# Patient Record
Sex: Female | Born: 1940 | Hispanic: No | Marital: Married | Smoking: Never smoker
Health system: Southern US, Community
[De-identification: ages and names within clinical notes are randomized; demographics above are authoritative.]

## PROBLEM LIST (undated history)

## (undated) DIAGNOSIS — F419 Anxiety disorder, unspecified: Secondary | ICD-10-CM

## (undated) DIAGNOSIS — G4730 Sleep apnea, unspecified: Secondary | ICD-10-CM

## (undated) DIAGNOSIS — Z9889 Other specified postprocedural states: Secondary | ICD-10-CM

## (undated) DIAGNOSIS — M8580 Other specified disorders of bone density and structure, unspecified site: Secondary | ICD-10-CM

## (undated) DIAGNOSIS — J45909 Unspecified asthma, uncomplicated: Secondary | ICD-10-CM

## (undated) DIAGNOSIS — K52831 Collagenous colitis: Secondary | ICD-10-CM

## (undated) DIAGNOSIS — R011 Cardiac murmur, unspecified: Secondary | ICD-10-CM

## (undated) DIAGNOSIS — M1990 Unspecified osteoarthritis, unspecified site: Secondary | ICD-10-CM

## (undated) DIAGNOSIS — R112 Nausea with vomiting, unspecified: Secondary | ICD-10-CM

## (undated) DIAGNOSIS — C50919 Malignant neoplasm of unspecified site of unspecified female breast: Secondary | ICD-10-CM

## (undated) DIAGNOSIS — I509 Heart failure, unspecified: Secondary | ICD-10-CM

## (undated) DIAGNOSIS — I10 Essential (primary) hypertension: Secondary | ICD-10-CM

## (undated) DIAGNOSIS — R0600 Dyspnea, unspecified: Secondary | ICD-10-CM

## (undated) DIAGNOSIS — E039 Hypothyroidism, unspecified: Secondary | ICD-10-CM

## (undated) DIAGNOSIS — IMO0002 Reserved for concepts with insufficient information to code with codable children: Secondary | ICD-10-CM

## (undated) DIAGNOSIS — D649 Anemia, unspecified: Secondary | ICD-10-CM

## (undated) HISTORY — DX: Collagenous colitis: K52.831

## (undated) HISTORY — DX: Hypothyroidism, unspecified: E03.9

## (undated) HISTORY — DX: Essential (primary) hypertension: I10

## (undated) HISTORY — PX: TONSILLECTOMY AND ADENOIDECTOMY: SUR1326

## (undated) HISTORY — DX: Unspecified asthma, uncomplicated: J45.909

## (undated) HISTORY — DX: Sleep apnea, unspecified: G47.30

## (undated) HISTORY — PX: CATARACT EXTRACTION W/ INTRAOCULAR LENS  IMPLANT, BILATERAL: SHX1307

## (undated) HISTORY — DX: Other specified disorders of bone density and structure, unspecified site: M85.80

## (undated) HISTORY — PX: OTHER SURGICAL HISTORY: SHX169

## (undated) HISTORY — PX: BREAST REDUCTION SURGERY: SHX8

## (undated) HISTORY — PX: REDUCTION MAMMAPLASTY: SUR839

## (undated) HISTORY — PX: COLONOSCOPY W/ BIOPSIES AND POLYPECTOMY: SHX1376

## (undated) HISTORY — DX: Anxiety disorder, unspecified: F41.9

## (undated) HISTORY — PX: SEPTOPLASTY: SUR1290

## (undated) HISTORY — DX: Unspecified osteoarthritis, unspecified site: M19.90

## (undated) HISTORY — PX: LUMBAR DISC SURGERY: SHX700

## (undated) HISTORY — DX: Malignant neoplasm of unspecified site of unspecified female breast: C50.919

## (undated) HISTORY — PX: RECONSTRUCTION BREAST W/ LATISSIMUS DORSI FLAP: SUR1078

## (undated) HISTORY — PX: MODIFIED RADICAL MASTECTOMY W/ AXILLARY LYMPH NODE DISSECTION: SHX2042

## (undated) HISTORY — PX: TOE SURGERY: SHX1073

---

## 1986-07-05 DIAGNOSIS — C50919 Malignant neoplasm of unspecified site of unspecified female breast: Secondary | ICD-10-CM

## 1986-07-05 HISTORY — DX: Malignant neoplasm of unspecified site of unspecified female breast: C50.919

## 2011-11-24 ENCOUNTER — Telehealth: Payer: Self-pay | Admitting: *Deleted

## 2011-11-24 ENCOUNTER — Other Ambulatory Visit: Payer: Self-pay | Admitting: Physician Assistant

## 2011-11-24 DIAGNOSIS — Z780 Asymptomatic menopausal state: Secondary | ICD-10-CM

## 2011-11-24 DIAGNOSIS — Z853 Personal history of malignant neoplasm of breast: Secondary | ICD-10-CM

## 2011-11-24 NOTE — Telephone Encounter (Signed)
Patient returned my call and I confirmed her for 6/13 w/ Dr. Darnelle Catalan.

## 2011-11-24 NOTE — Telephone Encounter (Signed)
Left message for patient to return my call so I can schedule her to see Dr. Darnelle Catalan.

## 2011-12-16 ENCOUNTER — Other Ambulatory Visit: Payer: Self-pay | Admitting: *Deleted

## 2011-12-16 ENCOUNTER — Encounter: Payer: Self-pay | Admitting: Oncology

## 2011-12-16 ENCOUNTER — Ambulatory Visit: Payer: Medicare Other

## 2011-12-16 ENCOUNTER — Other Ambulatory Visit (HOSPITAL_BASED_OUTPATIENT_CLINIC_OR_DEPARTMENT_OTHER): Payer: Medicare Other | Admitting: Lab

## 2011-12-16 ENCOUNTER — Ambulatory Visit (HOSPITAL_BASED_OUTPATIENT_CLINIC_OR_DEPARTMENT_OTHER): Payer: Medicare Other | Admitting: Oncology

## 2011-12-16 VITALS — BP 151/83 | HR 73 | Temp 98.7°F | Ht 63.0 in | Wt 251.9 lb

## 2011-12-16 DIAGNOSIS — J4530 Mild persistent asthma, uncomplicated: Secondary | ICD-10-CM | POA: Insufficient documentation

## 2011-12-16 DIAGNOSIS — K52831 Collagenous colitis: Secondary | ICD-10-CM

## 2011-12-16 DIAGNOSIS — G4730 Sleep apnea, unspecified: Secondary | ICD-10-CM

## 2011-12-16 DIAGNOSIS — E039 Hypothyroidism, unspecified: Secondary | ICD-10-CM | POA: Insufficient documentation

## 2011-12-16 DIAGNOSIS — Z853 Personal history of malignant neoplasm of breast: Secondary | ICD-10-CM

## 2011-12-16 DIAGNOSIS — C50919 Malignant neoplasm of unspecified site of unspecified female breast: Secondary | ICD-10-CM

## 2011-12-16 DIAGNOSIS — I10 Essential (primary) hypertension: Secondary | ICD-10-CM

## 2011-12-16 DIAGNOSIS — G4733 Obstructive sleep apnea (adult) (pediatric): Secondary | ICD-10-CM | POA: Insufficient documentation

## 2011-12-16 DIAGNOSIS — M899 Disorder of bone, unspecified: Secondary | ICD-10-CM

## 2011-12-16 DIAGNOSIS — M8580 Other specified disorders of bone density and structure, unspecified site: Secondary | ICD-10-CM | POA: Insufficient documentation

## 2011-12-16 DIAGNOSIS — M1990 Unspecified osteoarthritis, unspecified site: Secondary | ICD-10-CM | POA: Insufficient documentation

## 2011-12-16 DIAGNOSIS — J45909 Unspecified asthma, uncomplicated: Secondary | ICD-10-CM

## 2011-12-16 DIAGNOSIS — C50912 Malignant neoplasm of unspecified site of left female breast: Secondary | ICD-10-CM | POA: Insufficient documentation

## 2011-12-16 LAB — CBC WITH DIFFERENTIAL/PLATELET
Basophils Absolute: 0 10*3/uL (ref 0.0–0.1)
Eosinophils Absolute: 0.3 10*3/uL (ref 0.0–0.5)
HGB: 13.7 g/dL (ref 11.6–15.9)
LYMPH%: 15.9 % (ref 14.0–49.7)
MCH: 31.9 pg (ref 25.1–34.0)
MCV: 96.5 fL (ref 79.5–101.0)
MONO%: 10.9 % (ref 0.0–14.0)
NEUT#: 4.6 10*3/uL (ref 1.5–6.5)
Platelets: 153 10*3/uL (ref 145–400)
RBC: 4.3 10*6/uL (ref 3.70–5.45)

## 2011-12-16 LAB — COMPREHENSIVE METABOLIC PANEL
Alkaline Phosphatase: 108 U/L (ref 39–117)
BUN: 17 mg/dL (ref 6–23)
Glucose, Bld: 96 mg/dL (ref 70–99)
Total Bilirubin: 0.3 mg/dL (ref 0.3–1.2)

## 2011-12-16 NOTE — Progress Notes (Signed)
ID: [name]   DOB: 01/28/1941  MR#: [id number]  CSN#:[id number]  HISTORY OF PRESENT ILLNESS: [name] had mammography early in 1988, showing a 5 mm mass in her left breast. I do not have details of further workup of this but in March of 1988 she underwent left modified radical mastectomy for what proved to be a multifocal microscopic invasive ductal breast cancer. There was not enough tissue for estrogen receptor determination. She was treated adjuvantly with CMF x6, which she tolerated well.  Subsequently she underwent reconstruction involving a latissimus flap, which unfortunately necrosed. She later had an implant placed. She had a right reduction mammoplasty for symmetry. Her subsequent history is as detailed below.  INTERVAL HISTORY: [name] has recently moved to this area from [place]. She is interested in survivorship followup. Accordingly she is establishing herself of in our clinic today.  REVIEW OF SYSTEMS: She has some chronic problems, including some deconditioning, with shortness of breath when climbing stairs; and somewhat irregular bowel movements because of her history of collagenous colitis. She has significant osteoarthritis involving chiefly the hips, and she has some age related problems including mild hearing loss and presbyopia. A detailed review of systems however is otherwise noncontributory and in particular she gives me no symptoms suggestive of disease recurrence.   PAST MEDICAL HISTORY: Past Medical History  Diagnosis Date  . Breast cancer   . Hypertension   . Asthma   . Hypothyroidism   . Sleep apnea   . Collagenous colitis   . Osteopenia   . Osteoarthritis     PAST SURGICAL HISTORY: Past Surgical History  Procedure Date  . Modified radical mastectomy w/ axillary lymph node dissection     Left  . Breast enhancement surgery     Right  . Toe surgery   . Septoplasty   . Cesarean section   . Tonsillectomy and adenoidectomy     FAMILY HISTORY The  patient's father died at the age of 70. Her mother died at the age of 57. The patient has one full brother, one half-brother, and one half-sister. The patient is known to be BRCA1 and 2 negative by large rearrangement or specific mutation tests.  GYNECOLOGIC HISTORY: Menarche age 25. First live birth age 71. The patient is GX P1. She underwent early menopause at the age of 11 and took estrogen replacement for several years, switched to progesterone cream when she was diagnosed with breast cancer.   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 Her husband Eniola Cerullo is a former of [employer] man, now retired. Son Wayne Sever lives in [place] and works in Primary school teacher daughter Elyse Jarvis lives in [place] and is a medical or percent. The patient has no brain children. She is not a Advice worker.    ADVANCED DIRECTIVES: not in place   HEALTH MAINTENANCE: History  Substance Use Topics  . Smoking status: Not on file  . Smokeless tobacco: Not on file  . Alcohol Use: Not on file     Colonoscopy: 2012   PAP: 2012  Bone density: 2012  Lipid panel: [place]  Allergies  Allergen Reactions  . Sulfa Antibiotics     Current Outpatient Prescriptions  Medication Sig Dispense Refill  . albuterol-ipratropium (COMBIVENT) 18-103 MCG/ACT inhaler Inhale 2 puffs into the lungs every 6 (six) hours as needed.      . calcium-vitamin D (OSCAL WITH D) 500-200 MG-UNIT per tablet Take 1 tablet by mouth daily.      Marland Kitchen  Calcium-Vitamin D-Iron 981-191-47 MG-UNIT-MG TABS Take 1 tablet by mouth.      . diltiazem (CARDIZEM CD) 180 MG 24 hr capsule Take 180 mg by mouth daily.      . Flax OIL Take 1,000 mg by mouth.      . fluticasone (FLOVENT HFA) 110 MCG/ACT inhaler Inhale 1 puff into the lungs 2 (two) times daily.      . furosemide (LASIX) 40 MG tablet Take 40 mg by mouth daily.      . Glucos-Chon-MSM-Ca-C-CtCl-SeCu TABS Take 1 tablet by mouth.      . levothyroxine  (SYNTHROID, LEVOTHROID) 88 MCG tablet Take 88 mcg by mouth daily.      Marland Kitchen losartan (COZAAR) 50 MG tablet Take 50 mg by mouth daily.      . magnesium gluconate (MAGONATE) 500 MG tablet Take 500 mg by mouth 2 (two) times daily.      . Multiple Vitamins-Minerals (MULTIVITAMIN WITH MINERALS) tablet Take 1 tablet by mouth daily.      . Potassium 99 MG TABS Take 1 tablet by mouth.      . sertraline (ZOLOFT) 100 MG tablet Take 100 mg by mouth daily.      . Thiamine HCl (VITAMIN B-1 PO) Take 1 tablet by mouth.      . zolpidem (AMBIEN) 10 MG tablet Take 10 mg by mouth at bedtime as needed.        OBJECTIVE: Middle-aged white woman in no acute distress  Filed Vitals:   12/16/11 1639  BP: 151/83  Pulse: 73  Temp: 98.7 F (37.1 C)     Body mass index is 44.62 kg/(m^2).    ECOG FS: 0  Sclerae unicteric Oropharynx clear No cervical or supraclavicular adenopathy Lungs no rales or rhonchi Heart regular rate and rhythm Abd benign MSK no focal spinal tenderness, no peripheral edema Neuro: nonfocal Breasts:  the right breast is status post reduction mammoplasty. There are no suspicious findings. The left breast is status post modified radical mastectomy with implant reconstruction. There is no evidence of local recurrence.   LAB RESULTS: Lab Results  Component Value Date   WBC 6.7 12/16/2011   NEUTROABS 4.6 12/16/2011   HGB 13.7 12/16/2011   HCT 41.5 12/16/2011   MCV 96.5 12/16/2011   PLT 153 12/16/2011      Chemistry   No results found for this basename: NA, K, CL, CO2, BUN, CREATININE, GLU   No results found for this basename: CALCIUM, ALKPHOS, AST, ALT, BILITOT       No results found for this basename: LABCA2    No components found with this basename: LABCA125    No results found for this basename: INR:1;PROTIME:1 in the last 168 hours  Urinalysis No results found for this basename: colorurine, appearanceur, labspec, phurine, glucoseu, hgbur, bilirubinur, ketonesur, proteinur,  urobilinogen, nitrite, leukocytesur    STUDIES: No new results found. Mammography and DEXA scan are scheduled for tomorrow   ASSESSMENT: 71 y.o. [place] woman s/p Left modified radical mastectomy March 1988 for a pT1a pN0, stage IA microinvasive ductal breast cancer, estrogen receptor unknown, treated adjuvantly with cyclophosphamide/ methotrexate/ fluorouracil x6, without evidence of recurrence to date  PLAN: she understands that for a T1a tumors like the one she had in 1988 the recurrence rate within 10 years with local treatment only is in the 5% range. She would not have received chemotherapy if she had been diagnosed today. Nevertheless she did receive it and tolerated it well, and there has been no  evidence of recurrence to date.   She is interested in participating in a survivorship program. We do not have one up and running at this time, but we are still in the process of developing one. In the meantime we talked about what was sensible. Certainly yearly mammography, routine Pap smears, colonoscopy at appropriate Intervals, and yearly skin exam by a dermatologist would be prudent. Still, more women die from heart attacks been breast cancer, and I am concerned about her weight and history of high blood pressure.  My recommendations to her were to begin to work towards of a 45 minute per day, 5 times a week exercise program, preferably walking or something similar, since this has been shown to decrease the risk of cancer in general by as much as 3%. Once she has an exercise program in place she can next work on her diet. She would benefit from a full diet consult, but in general she should eat more vegetables and fewer starches. She should develop weight and activity goals and keep at them. She also should complete advanced directives at this point (an appropriate form was given her to complete and have notarized).  I am making her a return appointment here in one year. Now that she has  established herself in our database she may instead choose to use our services on an as-needed basis, in which case when her appointment comes up she will call and cancel.  Rejoice Heatwole C    12/16/2011

## 2011-12-17 ENCOUNTER — Ambulatory Visit
Admission: RE | Admit: 2011-12-17 | Discharge: 2011-12-17 | Disposition: A | Discharge status: Home / Self Care | Payer: Medicare Other | Source: Ambulatory Visit | Attending: Physician Assistant | Admitting: Physician Assistant

## 2011-12-17 ENCOUNTER — Other Ambulatory Visit: Payer: Self-pay | Admitting: Family Medicine

## 2011-12-17 DIAGNOSIS — Z853 Personal history of malignant neoplasm of breast: Secondary | ICD-10-CM

## 2011-12-17 DIAGNOSIS — Z780 Asymptomatic menopausal state: Secondary | ICD-10-CM

## 2011-12-20 ENCOUNTER — Telehealth: Payer: Self-pay | Admitting: *Deleted

## 2011-12-20 NOTE — Telephone Encounter (Signed)
PER PT, THE RASH ON HER BREAST HAS RESOLVED

## 2011-12-20 NOTE — Telephone Encounter (Signed)
Made patient appointment for 12-19-2012 lab starting at 10:30am mailed out calendar to let the patient know

## 2012-03-08 ENCOUNTER — Other Ambulatory Visit: Payer: Self-pay | Admitting: Endocrinology

## 2012-03-08 DIAGNOSIS — E049 Nontoxic goiter, unspecified: Secondary | ICD-10-CM

## 2012-03-14 ENCOUNTER — Ambulatory Visit
Admission: RE | Admit: 2012-03-14 | Discharge: 2012-03-14 | Disposition: A | Discharge status: Home / Self Care | Payer: Medicare Other | Source: Ambulatory Visit | Attending: Endocrinology | Admitting: Endocrinology

## 2012-03-14 DIAGNOSIS — E049 Nontoxic goiter, unspecified: Secondary | ICD-10-CM

## 2012-05-05 ENCOUNTER — Encounter: Payer: Self-pay | Admitting: Pulmonary Disease

## 2012-05-05 ENCOUNTER — Ambulatory Visit (INDEPENDENT_AMBULATORY_CARE_PROVIDER_SITE_OTHER): Payer: Medicare Other | Admitting: Pulmonary Disease

## 2012-05-05 VITALS — BP 118/74 | HR 68 | Temp 97.9°F | Ht 63.0 in | Wt 254.8 lb

## 2012-05-05 DIAGNOSIS — G4733 Obstructive sleep apnea (adult) (pediatric): Secondary | ICD-10-CM

## 2012-05-05 DIAGNOSIS — G4700 Insomnia, unspecified: Secondary | ICD-10-CM | POA: Insufficient documentation

## 2012-05-05 NOTE — Assessment & Plan Note (Signed)
She has history of sleep apnea.  She has done well with CPAP.  She is compliant with therapy.  Will get copy of her download, and call her with results.

## 2012-05-05 NOTE — Assessment & Plan Note (Signed)
She has been on [place] for years.  She was started on this prior to being diagnosed with sleep apnea.  Her sleep quality has improved since being on therapy for sleep apnea.    Advised that she could consider gradually weaning off ambien at some point.  Explained that this process could take weeks to months.  She will think about this, but does not want to change anything too much after her recent move back to [place].

## 2012-05-05 NOTE — Patient Instructions (Signed)
Will get copy of CPAP report and call with results Follow up in one year 

## 2012-05-05 NOTE — Progress Notes (Signed)
Primary Care Physician:   Referring provider:    Chief Complaint  Patient presents with  . Advice Only    had sleep study 2008. wears cpap everynight x 7-8 hrs a night. has new cpap, new mask but it doesn't seal good.    History of Present Illness: [name] is a 71 y.o. female for evaluation of sleep apnea.  She recently moved from [place] to [place].  She was diagnosed with sleep apnea in [place], and has been on CPAP therapy. She needed to establish follow up with sleep physician and is here for evaluation of her sleep apnea.  She uses [facility] for her DME.  She recently received a new CPAP machine.  Records indicate she is on CPAP 11 cm H2O.  She has a full face mask.  She uses skin molding, and this helps with mask fit.  She was started on ambien years ago before she was diagnosed with sleep apnea.  She takes ambien nightly.  She takes this at 930 pm, and then goes to sleep.  She wakes up once during the night.  She gets out of bed at 9 am.  She feels rested in the morning.  She does not use anything to help her sleep.  She does not need to take naps.  She uses her CPAP for the entire time she is asleep.  She uses to snore before starting CPAP, but not now.  She also used to get leg cramps at night, but this has resolved since starting CPAP.  The patient denies sleep walking, sleep talking, bruxism, or nightmares.  There is no history of restless legs.  The patient denies sleep hallucinations, sleep paralysis, or cataplexy.  Her Epworth score is 2 out of 24.  Tests: PSG 12/01/06>>AHI 10, SpO2 low 80%, PLMI 0 CPAP titration 02/16/07>>CPAP 11 cm H2O  Past Medical History  Diagnosis Date  . Breast cancer   . Hypertension   . Asthma   . Hypothyroidism   . Sleep apnea   . Collagenous colitis   . Osteopenia   . Osteoarthritis   . Anxiety     Past Surgical History  Procedure Date  . Modified radical mastectomy w/ axillary lymph node dissection     Left  . Breast  reduction surgery     Right  . Toe surgery   . Septoplasty   . Cesarean section   . Tonsillectomy and adenoidectomy   . Right foot surgery     corrected hammer toe, straightened big toe  . Reconstruction breast w/ latissimus dorsi flap     left    Current Outpatient Prescriptions on File Prior to Visit  Medication Sig Dispense Refill  . albuterol-ipratropium (COMBIVENT) 18-103 MCG/ACT inhaler Inhale 2 puffs into the lungs every 6 (six) hours as needed.      . Calcium Citrate-Vitamin D (CITRACAL + D PO) Take 2 tablets by mouth daily.      Marland Kitchen diltiazem (CARDIZEM CD) 180 MG 24 hr capsule Take 180 mg by mouth daily.      . diphenhydrAMINE (BENADRYL) 25 MG tablet Take 50 mg by mouth 4 (four) times daily.      . Flax OIL Take 1,000 mg by mouth 3 (three) times daily.       . fluticasone (FLOVENT HFA) 110 MCG/ACT inhaler Inhale 1 puff into the lungs 2 (two) times daily.      . furosemide (LASIX) 40 MG tablet Take 60 mg by mouth daily.       Marland Kitchen  Glucos-Chon-MSM-Ca-C-CtCl-SeCu TABS Take 1 tablet by mouth.      . levothyroxine (SYNTHROID, LEVOTHROID) 88 MCG tablet Take 88 mcg by mouth daily.      Marland Kitchen losartan (COZAAR) 50 MG tablet Take 50 mg by mouth daily.      . Multiple Vitamins-Minerals (MULTIVITAMIN WITH MINERALS) tablet Take 1 tablet by mouth daily.      . Olopatadine HCl (PATANASE) 0.6 % SOLN Place 2 puffs into the nose as needed.      . Potassium 99 MG TABS Take 3 tablets by mouth 3 (three) times daily.       . sertraline (ZOLOFT) 100 MG tablet Take 100 mg by mouth daily.      Marland Kitchen zolpidem (AMBIEN) 10 MG tablet Take 10 mg by mouth at bedtime as needed.        Allergies  Allergen Reactions  . Adhesive (Tape)   . Bactrim (Sulfamethoxazole W-Trimethoprim)   . Sulfa Antibiotics     Family History  Problem Relation Age of Onset  . Lung cancer Mother   . Ovarian cancer Sister   . Breast cancer Maternal Aunt   . Brain cancer Maternal Aunt   . Breast cancer      cousin  . Ovarian cancer       cousin  . Kidney cancer      cousin  . Kidney cancer Paternal Uncle   . Prostate cancer Paternal Uncle   . Hypertension Mother   . Hypertension Father   . Hypertension Brother     History  Substance Use Topics  . Smoking status: Never Smoker   . Smokeless tobacco: Not on file  . Alcohol Use: No    Review of Systems  Constitutional: Negative for fever, appetite change and unexpected weight change.  HENT: Positive for dental problem. Negative for ear pain, congestion, sore throat, sneezing and trouble swallowing.   Respiratory: Positive for cough and shortness of breath.   Cardiovascular: Negative for chest pain, palpitations and leg swelling.  Gastrointestinal: Negative for abdominal pain.  Musculoskeletal: Negative for joint swelling.  Skin: Negative for rash.  Psychiatric/Behavioral: Negative for dysphoric mood. The patient is not nervous/anxious.    Physical Exam: Filed Vitals:   05/05/12 1147 05/05/12 1148  BP:  118/74  Pulse:  68  Temp: 97.9 F (36.6 C)   TempSrc: Oral   Height: 5\' 3"  (1.6 m)   Weight: 254 lb 12.8 oz (115.577 kg)   SpO2:  97%  ,  Current Encounter SPO2  05/05/12 1148 97%    Wt Readings from Last 3 Encounters:  05/05/12 254 lb 12.8 oz (115.577 kg)  12/16/11 251 lb 14.4 oz (114.261 kg)    Body mass index is 45.14 kg/(m^2).   General - No distress ENT - No sinus tenderness, no oral exudate, no LAN, MP 4, enlarged tongue, no thyromegaly, pupils equal/reactive Cardiac - s1s2 regular, no murmur, pulses symmetric Chest - No wheeze/rales/dullness, good air entry, normal respiratory excursion Back - No focal tenderness Abd - Soft, non-tender, no organomegaly, + bowel sounds Ext - No edema Neuro - Normal strength, cranial nerves intact Skin - No rashes Psych - Normal mood, and behavior.   Lab Results  Component Value Date   WBC 6.7 12/16/2011   HGB 13.7 12/16/2011   HCT 41.5 12/16/2011   MCV 96.5 12/16/2011   PLT 153 12/16/2011    Lab  Results  Component Value Date   CREATININE 0.63 12/16/2011   BUN 17 12/16/2011   NA  141 12/16/2011   K 3.5 12/16/2011   CL 102 12/16/2011   CO2 25 12/16/2011    Lab Results  Component Value Date   ALT 19 12/16/2011   AST 23 12/16/2011   ALKPHOS 108 12/16/2011   BILITOT 0.3 12/16/2011    Assessment/Plan:  [name], MD [facility]/Sleep Pager:  [phone] 05/05/2012, 12:11 PM

## 2012-05-05 NOTE — Progress Notes (Deleted)
  Subjective:    Patient ID: [name], female    DOB: 21-Mar-1941, 71 y.o.   MRN: [id number]  HPI    Review of Systems  Constitutional: Negative for fever, appetite change and unexpected weight change.  HENT: Positive for dental problem. Negative for ear pain, congestion, sore throat, sneezing and trouble swallowing.   Respiratory: Positive for cough and shortness of breath.   Cardiovascular: Negative for chest pain, palpitations and leg swelling.  Gastrointestinal: Negative for abdominal pain.  Musculoskeletal: Negative for joint swelling.  Skin: Negative for rash.  Psychiatric/Behavioral: Negative for dysphoric mood. The patient is not nervous/anxious.        Objective:   Physical Exam        Assessment & Plan:

## 2012-05-16 ENCOUNTER — Telehealth: Payer: Self-pay | Admitting: Pulmonary Disease

## 2012-05-16 NOTE — Telephone Encounter (Signed)
CPAP 04/09/12 to 05/08/12>>Used on 30 of 30 nights with average 10 hrs 54 min.  Average AHI 1.6 with CPAP 11 cm H2O.  Will have my nurse inform patient that CPAP report looks very good.  No change to current set up.

## 2012-05-17 NOTE — Telephone Encounter (Signed)
I spoke with patient about results and she verbalized understanding and had no questions 

## 2012-06-06 ENCOUNTER — Ambulatory Visit (INDEPENDENT_AMBULATORY_CARE_PROVIDER_SITE_OTHER): Payer: Medicare Other | Admitting: Pulmonary Disease

## 2012-06-06 ENCOUNTER — Encounter: Payer: Self-pay | Admitting: Pulmonary Disease

## 2012-06-06 ENCOUNTER — Ambulatory Visit (INDEPENDENT_AMBULATORY_CARE_PROVIDER_SITE_OTHER)
Admission: RE | Admit: 2012-06-06 | Discharge: 2012-06-06 | Disposition: A | Discharge status: Home / Self Care | Payer: Medicare Other | Source: Ambulatory Visit | Attending: Pulmonary Disease | Admitting: Pulmonary Disease

## 2012-06-06 VITALS — BP 138/78 | HR 80 | Temp 98.2°F | Ht 63.0 in | Wt 257.8 lb

## 2012-06-06 DIAGNOSIS — J984 Other disorders of lung: Secondary | ICD-10-CM

## 2012-06-06 DIAGNOSIS — J45909 Unspecified asthma, uncomplicated: Secondary | ICD-10-CM

## 2012-06-06 DIAGNOSIS — G4733 Obstructive sleep apnea (adult) (pediatric): Secondary | ICD-10-CM

## 2012-06-06 MED ORDER — ALBUTEROL SULFATE HFA 108 (90 BASE) MCG/ACT IN AERS: 2.0000 | INHALATION_SPRAY | Freq: Four times a day (QID) | RESPIRATORY_TRACT | Status: DC | PRN

## 2012-06-06 NOTE — Progress Notes (Signed)
Chief Complaint  Patient presents with  . Advice Only    dx w/ asthma 1991. here to establish pulmonologists    History of Present Illness: [name] is a 71 y.o. female with sleep apnea and asthma.  She was diagnosed with asthma in 1991.  She was previously followed by pulmonary medicine in [place].  She would like to establish pulmonary follow up in [place].  She uses flovent daily.  She does not need to use combivent much. She was previously on allergy shots.  She is allergic to mold, dust, and cats.  She has a Development worker, international aid.  She tried smoking in college.  Her parents used to smoke.  She denies any occupational exposures.  She does not have any problem taking aspirin or NSAIDS.  There is no history of pneumonia or exposure to TB.  She gets more trouble with her breathing in cold weather, especially when it snows.  She is not currently having trouble with cough, wheeze, sputum, chest congestion, sinus congestion, sore throat, or skin rash.  She has noticed eye watering when she works on the computer.  She also gets intermittent ear burning.  Tests: PSG 12/01/06>>AHI 10, SpO2 low 80%, PLMI 0  CPAP titration 02/16/07>>CPAP 11 cm H2O CPAP 04/09/12 to 05/08/12>>Used on 30 of 30 nights with average 10 hrs 54 min. Average AHI 1.6 with CPAP 11 cm H2O. Spirometry 06/06/12>>FEV1 1.48 (69%), FVC 1.95 (70%), FEV1% 76  Past Medical History  Diagnosis Date  . Breast cancer   . Hypertension   . Asthma   . Hypothyroidism   . Sleep apnea   . Collagenous colitis   . Osteopenia   . Osteoarthritis   . Anxiety     Past Surgical History  Procedure Date  . Modified radical mastectomy w/ axillary lymph node dissection     Left  . Breast reduction surgery     Right  . Toe surgery   . Septoplasty   . Cesarean section   . Tonsillectomy and adenoidectomy   . Right foot surgery     corrected hammer toe, straightened big toe  . Reconstruction breast w/ latissimus dorsi flap     left     Current Outpatient Prescriptions on File Prior to Visit  Medication Sig Dispense Refill  . bismuth subsalicylate (PEPTO BISMOL) 262 MG/15ML suspension Take 15 mLs by mouth every 6 (six) hours as needed.      . Calcium Citrate-Vitamin D (CITRACAL + D PO) Take 2 tablets by mouth daily.      . Cholecalciferol (VITAMIN D-3) 1000 UNITS CAPS Take 2 capsules by mouth daily.      Marland Kitchen diltiazem (CARDIZEM CD) 180 MG 24 hr capsule Take 180 mg by mouth daily.      . diphenhydrAMINE (BENADRYL) 25 MG tablet Take 50 mg by mouth 4 (four) times daily.      . Flax OIL Take 1,000 mg by mouth 3 (three) times daily.       . fluticasone (FLOVENT HFA) 110 MCG/ACT inhaler Inhale 2 puffs into the lungs daily.       . furosemide (LASIX) 40 MG tablet Take 60 mg by mouth daily.       . Glucos-Chon-MSM-Ca-C-CtCl-SeCu TABS Take 1 tablet by mouth.      . levothyroxine (SYNTHROID, LEVOTHROID) 88 MCG tablet Take 88 mcg by mouth daily.      . Multiple Vitamins-Minerals (MULTIVITAMIN WITH MINERALS) tablet Take 1 tablet by mouth daily.      Marland Kitchen  NON FORMULARY Mega b stress once a day      . Olopatadine HCl (PATANASE) 0.6 % SOLN Place 2 puffs into the nose as needed.      . Potassium Bicarbonate 99 MG CAPS Take 4 capsules by mouth 3 (three) times daily.      . sertraline (ZOLOFT) 100 MG tablet Take 100 mg by mouth daily.      Marland Kitchen zolpidem (AMBIEN) 10 MG tablet Take 10 mg by mouth at bedtime as needed.      Marland Kitchen albuterol (PROAIR HFA) 108 (90 BASE) MCG/ACT inhaler Inhale 2 puffs into the lungs every 6 (six) hours as needed for wheezing.  1 Inhaler  3    Allergies  Allergen Reactions  . Adhesive (Tape)   . Bactrim (Sulfamethoxazole W-Trimethoprim)   . Sulfa Antibiotics     Family History  Problem Relation Age of Onset  . Lung cancer Mother   . Ovarian cancer Sister   . Breast cancer Maternal Aunt   . Brain cancer Maternal Aunt   . Breast cancer      cousin  . Ovarian cancer      cousin  . Kidney cancer      cousin  .  Kidney cancer Paternal Uncle   . Prostate cancer Paternal Uncle   . Hypertension Mother   . Hypertension Father   . Hypertension Brother     History  Substance Use Topics  . Smoking status: Never Smoker   . Smokeless tobacco: Not on file  . Alcohol Use: No    Review of Systems  Constitutional: Negative for fever, appetite change and unexpected weight change.  HENT: Positive for ear pain and trouble swallowing. Negative for congestion, sore throat, sneezing, dental problem and sinus pressure.   Respiratory: Positive for cough and shortness of breath.   Cardiovascular: Negative for chest pain, palpitations and leg swelling.  Gastrointestinal: Negative for abdominal pain.  Skin: Positive for rash.  Neurological: Negative for headaches.  Psychiatric/Behavioral: The patient is nervous/anxious.    Physical Exam: Filed Vitals:   06/06/12 1536 06/06/12 1539  BP:  138/78  Pulse:  80  Temp: 98.2 F (36.8 C)   TempSrc: Oral   Height: 5\' 3"  (1.6 m)   Weight: 257 lb 12.8 oz (116.937 kg)   SpO2:  95%  ,  Current Encounter SPO2  06/06/12 1539 95%  05/05/12 1148 97%    Wt Readings from Last 3 Encounters:  06/06/12 257 lb 12.8 oz (116.937 kg)  05/05/12 254 lb 12.8 oz (115.577 kg)  12/16/11 251 lb 14.4 oz (114.261 kg)    Body mass index is 45.67 kg/(m^2).  General - No distress  ENT - No sinus tenderness, no oral exudate, no LAN, MP 4, enlarged tongue, no thyromegaly, pupils equal/reactive Cardiac - s1s2 regular, no murmur, pulses symmetric  Chest - No wheeze/rales/dullness, good air entry, normal respiratory excursion  Back - No focal tenderness  Abd - Soft, non-tender, no organomegaly, + bowel sounds  Ext - No edema  Neuro - Normal strength, cranial nerves intact  Skin - No rashes  Psych - Normal mood, and behavior.   Assessment/Plan:  [name], MD [facility]/Sleep Pager:  [phone] 06/06/2012, 4:26 PM

## 2012-06-06 NOTE — Assessment & Plan Note (Signed)
Spirometry today is suggestive of restrictive defect.  To further assess will arrange for chest xray and full pulmonary function testing.

## 2012-06-06 NOTE — Assessment & Plan Note (Addendum)
She is stable on her current regimen.  Will get copy of her medical records from [place].  She will check when she got her pneumonia shot.  Will change combivent to proair to be used as needed.

## 2012-06-06 NOTE — Patient Instructions (Addendum)
Proair two puffs as needed for cough, wheeze, or chest congestion Check when you got your pneumonia shot Chest xray today>>will call with results Will schedule breathing test (PFT)>>will call with results Follow up in 6 months

## 2012-06-06 NOTE — Assessment & Plan Note (Signed)
She is compliant and reports benefit from CPAP.

## 2012-06-06 NOTE — Progress Notes (Deleted)
  Subjective:    Patient ID: [name], female    DOB: September 24, 1940, 71 y.o.   MRN: [id number]  HPI    Review of Systems  Constitutional: Negative for fever, appetite change and unexpected weight change.  HENT: Positive for ear pain and trouble swallowing. Negative for congestion, sore throat, sneezing, dental problem and sinus pressure.   Respiratory: Positive for cough and shortness of breath.   Cardiovascular: Negative for chest pain, palpitations and leg swelling.  Gastrointestinal: Negative for abdominal pain.  Skin: Positive for rash.  Neurological: Negative for headaches.  Psychiatric/Behavioral: The patient is nervous/anxious.        Objective:   Physical Exam        Assessment & Plan:

## 2012-06-07 ENCOUNTER — Telehealth: Payer: Self-pay | Admitting: Pulmonary Disease

## 2012-06-07 ENCOUNTER — Telehealth: Payer: Self-pay | Admitting: *Deleted

## 2012-06-07 NOTE — Telephone Encounter (Signed)
Mailed out calendar to inform the patient of the new date and time on 12-28-2012 starting at 10:30am

## 2012-06-07 NOTE — Telephone Encounter (Signed)
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  Findings: Basilar linear scarring or atelectasis is noted.  No active infiltrate or effusion is seen.  The heart is mildly enlarged.  Mediastinal contours are unremarkable.  No acute bony abnormality is seen.  IMPRESSION: Basilar linear scarring or atelectasis.  Cardiomegaly.   Original Report Authenticated By: Dwyane Dee, M.D.     Results d/w pt.  No change to current tx plan.

## 2012-06-21 ENCOUNTER — Telehealth: Payer: Self-pay | Admitting: Oncology

## 2012-06-21 NOTE — Telephone Encounter (Signed)
S/W THE PT AND SHE IS AWARE OF HER MD CHANGE FROM DR Donnie Coffin TO DR Darnelle Catalan ON THE SAME DAY IN June.

## 2012-07-27 ENCOUNTER — Ambulatory Visit (INDEPENDENT_AMBULATORY_CARE_PROVIDER_SITE_OTHER): Payer: Medicare Other | Admitting: Pulmonary Disease

## 2012-07-27 DIAGNOSIS — J984 Other disorders of lung: Secondary | ICD-10-CM

## 2012-07-27 DIAGNOSIS — J45909 Unspecified asthma, uncomplicated: Secondary | ICD-10-CM

## 2012-07-27 LAB — PULMONARY FUNCTION TEST

## 2012-07-27 NOTE — Progress Notes (Signed)
PFT done today. 

## 2012-08-02 ENCOUNTER — Telehealth: Payer: Self-pay | Admitting: Pulmonary Disease

## 2012-08-02 NOTE — Telephone Encounter (Signed)
PFT 07/27/12 >> FEV1 1.71 (88%), FEV1% 77, FEF 25-75% 1.44 (64%), TLC 3.58 (77%), DLCO 72%, no BD.   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   Findings: Basilar linear scarring or atelectasis is noted.  No active infiltrate or effusion is seen.  The heart is mildly enlarged.  Mediastinal contours are unremarkable.  No acute bony abnormality is seen.   IMPRESSION:  Basilar linear scarring or atelectasis.  Cardiomegaly.    Original Report Authenticated By: Dwyane Dee, M.D.     Results d/w pt.  No change to current treatment plan.

## 2012-09-01 ENCOUNTER — Encounter: Payer: Self-pay | Admitting: Pulmonary Disease

## 2012-09-29 ENCOUNTER — Telehealth: Payer: Self-pay | Admitting: *Deleted

## 2012-09-29 NOTE — Telephone Encounter (Signed)
Patient called in today very upset crying, she thinks that her left breast implant has ruptured. She states she had a pimple above the incision line 2 days ago, yesterday it was bloody, and today she has clear liquid. She has not had any obvious injury or trauma to the breast. Her plastic surgeon is in [place], she does not have a Biomedical engineer. After speaking to patient in more detail, she does not note any fever, abnormal change in size or disfigurement of the breast. She states the amount of clear liquid that drained today was just a few drops on the tissue. It sounds like this could be some type of boil or sub-cutaneous nodule, however, patient seems very upset and distressed of situation. Dr Darnelle Catalan is off on Fridays, I asked patient if she has a primary MD, she states she will call Dr Cyndia Bent and see if they are available today. Instructed patient to call me back if she could not be seen. After waiting a little while, called over the Dr Tenna Delaine office myself to see if patient had called in for evalualtion of this area,  reception said she was talking to the nurse at that time, and they would be seeing patient today. Informed them to please let us know if anything we need to do should this be directly related to her history of breast cancer.

## 2012-10-02 ENCOUNTER — Telehealth: Payer: Self-pay | Admitting: *Deleted

## 2012-10-02 NOTE — Telephone Encounter (Signed)
Received call from patient stating she was seen by her primary care MD on Friday for this area/ pimple on her left breast and was thought to have an infection.  Was given an rocephin injection and started on doxycycline and patient states the redness and drainage is much better but would still like to come in for [place] to look at it.  Confirmed appt. With Ernestina Penna for 10/03/12 at 0945.

## 2012-10-03 ENCOUNTER — Ambulatory Visit (HOSPITAL_BASED_OUTPATIENT_CLINIC_OR_DEPARTMENT_OTHER): Payer: Medicare Other | Admitting: Nurse Practitioner

## 2012-10-03 ENCOUNTER — Encounter: Payer: Self-pay | Admitting: Nurse Practitioner

## 2012-10-03 VITALS — BP 127/75 | HR 79 | Temp 98.0°F | Resp 20 | Ht 63.0 in | Wt 263.2 lb

## 2012-10-03 DIAGNOSIS — L02818 Cutaneous abscess of other sites: Secondary | ICD-10-CM

## 2012-10-03 DIAGNOSIS — Z853 Personal history of malignant neoplasm of breast: Secondary | ICD-10-CM

## 2012-10-03 DIAGNOSIS — Z9010 Acquired absence of unspecified breast and nipple: Secondary | ICD-10-CM

## 2012-10-03 NOTE — Progress Notes (Signed)
ID: [name]   DOB: 1941-03-28  MR#: [id number]  CSN#:[id number]  PCP: Eartha Inch, MD GYN:  SU:  OTHER MD:   HISTORY OF PRESENT ILLNESS: Livana had mammography early in 1988, showing a 5 mm mass in her left breast. I do not have details of further workup of this but in March of 1988 she underwent left modified radical mastectomy for what proved to be a multifocal microscopic invasive ductal breast cancer. There was not enough tissue for estrogen receptor determination. She was treated adjuvantly with CMF x6, which she tolerated well.  Subsequently she underwent reconstruction involving a latissimus flap, which unfortunately necrosed. She later had an implant placed. She had a right reduction mammoplasty for symmetry. Her subsequent history is as detailed below.   INTERVAL HISTORY: Hazyl returns today at her request after she was seen by her primary care MD on  Friday for this area/ pimple on her left breast and was thought to have an infection. Was given an rocephin injection and started on doxycycline and patient states the redness and drainage is much better but still wanted to be seen today. She has been under tremendous stress with her daughter having a recent miscarriage, her son's house foreclosing; a close relative dying and numerous other stresses such as her husband traveling, and getting established in the [place] area.    REVIEW OF SYSTEMS: She relates her main concern/complaint was a stinging/burning pain Thursday morning in her left breast. By Thursday evening, she had a pimple that was oozing clear fluid, the left breast and mid chest were bright red and very hot. She showed me a picture on her phone that the LPN at her PCP office had taken on Friday morning.  Its very obvious from the photo that now her breast is dramatically improved.   PAST MEDICAL HISTORY: Past Medical History  Diagnosis Date  . Breast cancer   . Hypertension   . Asthma   . Hypothyroidism   .  Sleep apnea   . Collagenous colitis   . Osteopenia   . Osteoarthritis   . Anxiety     PAST SURGICAL HISTORY: Past Surgical History  Procedure Laterality Date  . Modified radical mastectomy w/ axillary lymph node dissection      Left  . Breast reduction surgery      Right  . Toe surgery    . Septoplasty    . Cesarean section    . Tonsillectomy and adenoidectomy    . Right foot surgery      corrected hammer toe, straightened big toe  . Reconstruction breast w/ latissimus dorsi flap      left      FAMILY HISTORY Family History  Problem Relation Age of Onset  . Lung cancer Mother   . Ovarian cancer Sister   . Breast cancer Maternal Aunt   . Brain cancer Maternal Aunt   . Breast cancer      cousin  . Ovarian cancer      cousin  . Kidney cancer      cousin  . Kidney cancer Paternal Uncle   . Prostate cancer Paternal Uncle   . Hypertension Mother   . Hypertension Father   . Hypertension Brother     GYNECOLOGIC HISTORY: Menarche age 8. First live birth age 53. The patient is GX P1. She underwent early menopause at the age of 34 and took estrogen replacement for several years, switched to progesterone cream when she was diagnosed with breast cancer.  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 Her husband Konnor Jorden is a former of [employer] man, now retired. Son Wayne Sever lives in [place] and works in Primary school teacher daughter Santina Evans  lives in [place]. The patient has no grandchildren. She is not a Advice worker.   ADVANCED DIRECTIVES: not in place   HEALTH MAINTENANCE: History  Substance Use Topics  . Smoking status: Never Smoker   . Smokeless tobacco: Not on file  . Alcohol Use: No     Colonoscopy:  PAP:  Bone density:  Lipid panel:  Allergies  Allergen Reactions  . Adhesive (Tape)   . Bactrim (Sulfamethoxazole W-Trimethoprim)   . Sulfa Antibiotics     Current Outpatient Prescriptions  Medication Sig  Dispense Refill  . albuterol (PROAIR HFA) 108 (90 BASE) MCG/ACT inhaler Inhale 2 puffs into the lungs every 6 (six) hours as needed for wheezing.  1 Inhaler  3  . bismuth subsalicylate (PEPTO BISMOL) 262 MG/15ML suspension Take 15 mLs by mouth every 6 (six) hours as needed.      . Calcium Citrate-Vitamin D (CITRACAL + D PO) Take 2 tablets by mouth daily.      . Cholecalciferol (VITAMIN D-3) 1000 UNITS CAPS Take 2 capsules by mouth daily.      . diphenhydrAMINE (BENADRYL) 25 MG tablet Take 50 mg by mouth 4 (four) times daily.      . Flax OIL Take 1,000 mg by mouth 3 (three) times daily.       . fluticasone (FLOVENT HFA) 110 MCG/ACT inhaler Inhale 2 puffs into the lungs daily.       . furosemide (LASIX) 40 MG tablet Take 60 mg by mouth daily.       . Glucos-Chon-MSM-Ca-C-CtCl-SeCu TABS Take 1 tablet by mouth.      . levothyroxine (SYNTHROID, LEVOTHROID) 88 MCG tablet Take 88 mcg by mouth daily.      Marland Kitchen losartan (COZAAR) 100 MG tablet Take 100 mg by mouth daily.      . Multiple Vitamins-Minerals (MULTIVITAMIN WITH MINERALS) tablet Take 1 tablet by mouth daily.      . NON FORMULARY Mega b stress once a day      . Olopatadine HCl (PATANASE) 0.6 % SOLN Place 2 puffs into the nose as needed.      . Potassium Bicarbonate 99 MG CAPS Take 4 capsules by mouth 3 (three) times daily.      . sertraline (ZOLOFT) 100 MG tablet Take 100 mg by mouth daily.      Marland Kitchen zolpidem (AMBIEN) 10 MG tablet Take 10 mg by mouth at bedtime as needed.       No current facility-administered medications for this visit.    OBJECTIVE: Filed Vitals:   10/03/12 0957  BP: 127/75  Pulse: 79  Temp: 98 F (36.7 C)  Resp: 20     Body mass index is 46.64 kg/(m^2).    ECOG FS:   Focused physical exam today. HEENT: Sclerae unicteric, EOMI intact Oropharynx clear Breasts:  Surgical scars noted bilaterally to both breasts; left breast s/p mastectomy with reconstruction. Faint outline of erythema noted to upper portion of left  breast; no drainage; no heat noted. No suspicious areas or nodes noted to either axilla Neuro: cranial nerves 2-12 grossly intact, speech and gait normal Psychiatric: mildly anxious, very talkative  LAB RESULTS: Lab Results  Component Value Date   WBC 6.7 12/16/2011   NEUTROABS 4.6 12/16/2011   HGB 13.7 12/16/2011  HCT 41.5 12/16/2011   MCV 96.5 12/16/2011   PLT 153 12/16/2011      Chemistry      Component Value Date/Time   NA 141 12/16/2011 1617   K 3.5 12/16/2011 1617   CL 102 12/16/2011 1617   CO2 25 12/16/2011 1617   BUN 17 12/16/2011 1617   CREATININE 0.63 12/16/2011 1617      Component Value Date/Time   CALCIUM 10.1 12/16/2011 1617   ALKPHOS 108 12/16/2011 1617   AST 23 12/16/2011 1617   ALT 19 12/16/2011 1617   BILITOT 0.3 12/16/2011 1617       Lab Results  Component Value Date   LABCA2 43* 12/16/2011    No components found with this basename: LABCA125    No results found for this basename: INR,  in the last 168 hours  Urinalysis No results found for this basename: colorurine, appearanceur, labspec, phurine, glucoseu, hgbur, bilirubinur, ketonesur, proteinur, urobilinogen, nitrite, leukocytesur    STUDIES: No results found.  ASSESSMENT: 72 y.o. originally diagnosed in 1988 with a 5mm mass detected on mammography.  March of 1988 she underwent left modified radical mastectomy for what proved to be a multifocal microscopic invasive ductal breast cancer. There was not enough tissue for estrogen receptor determination. She was treated adjuvantly with CMF x6, which she tolerated well. She self referred here last year for ongoing follow up, despite her overall low risk of recurrence.  She presented today for follow up of left breast cellulitis, treated at her PCP with a shot of rocephin and 10 days of oral doxycycline. She responded very well with only a faint red outline today visible where the original cellulitis was. She was reassured regarding the benign nature of this  condition.   PLAN: Strongly encouraged patient to continue her course of oral Doxycyline and also to look at ways to aggressively reduce stress in her life. She has kept an extensive diary of the incidence and frequency of infections. She has recognized when she becomes overwhelmed, stops exercising, and internalizes stress she tends to have these infections. She was counseled to resume walking on her treadmill or outside, to consider keeping a journal, or talking with someone, on a regular basis to help her deal with stress, particularly during times of crisis. She voiced appreciation and understanding. Her case was discussed with Dr. Darnelle Catalan - she was seen for approximately 45 to 55 minutes. Greater than 50% of time spent in counseling.    Tiaria Biby  AOCNP, NP-C   10/03/2012

## 2012-10-07 ENCOUNTER — Other Ambulatory Visit: Payer: Self-pay | Admitting: Pulmonary Disease

## 2012-11-14 ENCOUNTER — Telehealth: Payer: Self-pay | Admitting: Pulmonary Disease

## 2012-11-14 NOTE — Telephone Encounter (Signed)
I spoke with pt and advised her flovent is not generic. She voiced her understanding and needed nothing further

## 2012-11-15 ENCOUNTER — Other Ambulatory Visit: Payer: Self-pay

## 2012-11-15 ENCOUNTER — Other Ambulatory Visit: Payer: Self-pay | Admitting: Physician Assistant

## 2012-11-15 DIAGNOSIS — Z853 Personal history of malignant neoplasm of breast: Secondary | ICD-10-CM

## 2012-11-15 DIAGNOSIS — M810 Age-related osteoporosis without current pathological fracture: Secondary | ICD-10-CM

## 2012-11-15 DIAGNOSIS — Z9012 Acquired absence of left breast and nipple: Secondary | ICD-10-CM

## 2012-11-15 DIAGNOSIS — Z1231 Encounter for screening mammogram for malignant neoplasm of breast: Secondary | ICD-10-CM

## 2012-11-23 ENCOUNTER — Other Ambulatory Visit: Payer: Self-pay | Admitting: *Deleted

## 2012-11-23 MED ORDER — FLUTICASONE PROPIONATE HFA 110 MCG/ACT IN AERO: 2.0000 | INHALATION_SPRAY | Freq: Every day | RESPIRATORY_TRACT | Status: DC

## 2012-11-23 MED ORDER — ALBUTEROL SULFATE HFA 108 (90 BASE) MCG/ACT IN AERS: 2.0000 | INHALATION_SPRAY | Freq: Four times a day (QID) | RESPIRATORY_TRACT | Status: DC | PRN

## 2012-12-13 ENCOUNTER — Ambulatory Visit: Payer: Medicare Other | Admitting: Pulmonary Disease

## 2012-12-19 ENCOUNTER — Ambulatory Visit: Payer: Medicare Other | Admitting: Oncology

## 2012-12-19 ENCOUNTER — Other Ambulatory Visit: Payer: Medicare Other | Admitting: Lab

## 2012-12-20 ENCOUNTER — Ambulatory Visit
Admission: RE | Admit: 2012-12-20 | Discharge: 2012-12-20 | Disposition: A | Discharge status: Home / Self Care | Payer: Medicare Other | Source: Ambulatory Visit | Attending: Physician Assistant | Admitting: Physician Assistant

## 2012-12-20 ENCOUNTER — Ambulatory Visit
Admission: RE | Admit: 2012-12-20 | Discharge: 2012-12-20 | Disposition: A | Discharge status: Home / Self Care | Payer: Medicare Other | Source: Ambulatory Visit

## 2012-12-20 DIAGNOSIS — Z853 Personal history of malignant neoplasm of breast: Secondary | ICD-10-CM

## 2012-12-20 DIAGNOSIS — M810 Age-related osteoporosis without current pathological fracture: Secondary | ICD-10-CM

## 2012-12-20 DIAGNOSIS — Z9012 Acquired absence of left breast and nipple: Secondary | ICD-10-CM

## 2012-12-20 DIAGNOSIS — Z1231 Encounter for screening mammogram for malignant neoplasm of breast: Secondary | ICD-10-CM

## 2012-12-27 ENCOUNTER — Other Ambulatory Visit: Payer: Self-pay | Admitting: Family

## 2012-12-27 DIAGNOSIS — C50919 Malignant neoplasm of unspecified site of unspecified female breast: Secondary | ICD-10-CM

## 2012-12-27 DIAGNOSIS — M8580 Other specified disorders of bone density and structure, unspecified site: Secondary | ICD-10-CM

## 2012-12-28 ENCOUNTER — Ambulatory Visit (HOSPITAL_BASED_OUTPATIENT_CLINIC_OR_DEPARTMENT_OTHER): Payer: Medicare Other | Admitting: Oncology

## 2012-12-28 ENCOUNTER — Telehealth: Payer: Self-pay | Admitting: *Deleted

## 2012-12-28 ENCOUNTER — Other Ambulatory Visit (HOSPITAL_BASED_OUTPATIENT_CLINIC_OR_DEPARTMENT_OTHER): Payer: Medicare Other | Admitting: Lab

## 2012-12-28 ENCOUNTER — Ambulatory Visit: Payer: Medicare Other | Admitting: Oncology

## 2012-12-28 VITALS — BP 153/81 | HR 68 | Temp 98.0°F | Resp 20 | Ht 63.0 in | Wt 258.6 lb

## 2012-12-28 DIAGNOSIS — C50919 Malignant neoplasm of unspecified site of unspecified female breast: Secondary | ICD-10-CM

## 2012-12-28 DIAGNOSIS — M8580 Other specified disorders of bone density and structure, unspecified site: Secondary | ICD-10-CM

## 2012-12-28 DIAGNOSIS — C50912 Malignant neoplasm of unspecified site of left female breast: Secondary | ICD-10-CM

## 2012-12-28 LAB — COMPREHENSIVE METABOLIC PANEL (CC13)
ALT: 18 U/L (ref 0–55)
AST: 18 U/L (ref 5–34)
Albumin: 3.7 g/dL (ref 3.5–5.0)
Alkaline Phosphatase: 75 U/L (ref 40–150)
Potassium: 3.3 mEq/L — ABNORMAL LOW (ref 3.5–5.1)
Sodium: 140 mEq/L (ref 136–145)
Total Bilirubin: 0.45 mg/dL (ref 0.20–1.20)
Total Protein: 7 g/dL (ref 6.4–8.3)

## 2012-12-28 LAB — CBC WITH DIFFERENTIAL/PLATELET
BASO%: 0.4 % (ref 0.0–2.0)
Basophils Absolute: 0 10*3/uL (ref 0.0–0.1)
EOS%: 5.3 % (ref 0.0–7.0)
HCT: 41 % (ref 34.8–46.6)
HGB: 13.9 g/dL (ref 11.6–15.9)
LYMPH%: 19.2 % (ref 14.0–49.7)
MCH: 33.6 pg (ref 25.1–34.0)
MCHC: 33.8 g/dL (ref 31.5–36.0)
MCV: 99.3 fL (ref 79.5–101.0)
MONO%: 8.9 % (ref 0.0–14.0)
NEUT%: 66.2 % (ref 38.4–76.8)
lymph#: 0.8 10*3/uL — ABNORMAL LOW (ref 0.9–3.3)

## 2012-12-28 NOTE — Telephone Encounter (Signed)
appts made and printed...td 

## 2012-12-28 NOTE — Progress Notes (Signed)
ID: [name]   DOB: May 14, 1941  MR#: [id number]  CSN#:[id number]  PCP: Eartha Inch, MD GYN: SU:  OTHER MD:   HISTORY OF PRESENT ILLNESS: [name] had mammography early in 1988, showing a 5 mm mass in her left breast. I do not have details of further workup of this but in March of 1988 she underwent left modified radical mastectomy for what proved to be a multifocal microscopic invasive ductal breast cancer. There was not enough tissue for estrogen receptor determination. She was treated adjuvantly with CMF x6, which she tolerated well.  Subsequently she underwent reconstruction involving a latissimus flap, which unfortunately necrosed. She later had an implant placed. She had a right reduction mammoplasty for symmetry. Her subsequent history is as detailed below.  INTERVAL HISTORY: [name] returns today for followup of her breast cancer. The interval history is significant for her son having moved to this area. He was quite sick about a week ago with what sounds like a horrible viral illness requiring a trip to the emergency room. He is now feeling better and looking for a job  REVIEW OF SYSTEMS: She is working on writing place, exercises regularly chiefly by walking her dogs, and a detailed review of systems today was entirely negative.  PAST MEDICAL HISTORY: Past Medical History  Diagnosis Date  . Breast cancer   . Hypertension   . Asthma   . Hypothyroidism   . Sleep apnea   . Collagenous colitis   . Osteopenia   . Osteoarthritis   . Anxiety     PAST SURGICAL HISTORY: Past Surgical History  Procedure Laterality Date  . Modified radical mastectomy w/ axillary lymph node dissection      Left  . Breast reduction surgery      Right  . Toe surgery    . Septoplasty    . Cesarean section    . Tonsillectomy and adenoidectomy    . Right foot surgery      corrected hammer toe, straightened big toe  . Reconstruction breast w/ latissimus dorsi flap      left    FAMILY  HISTORY The patient's father died at the age of 77. Her mother died at the age of 32. The patient has one full brother, one half-brother, and one half-sister. The patient is known to be BRCA1 and 2 negative by large rearrangement or specific mutation tests.  GYNECOLOGIC HISTORY: Menarche age 23. First live birth age 65. The patient is GX P1. She underwent early menopause at the age of 47 and took estrogen replacement for several years, switched to progesterone cream when she was diagnosed with breast cancer.   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 Her husband [name] is a former of [employer] man, now retired. Son [name] works in Primary school teacher. Daughter [name] in [place] and works as a Technical brewer of a [employer]. The patient has no grandchildren. She is not a Advice worker.    ADVANCED DIRECTIVES: not in place   HEALTH MAINTENANCE: History  Substance Use Topics  . Smoking status: Never Smoker   . Smokeless tobacco: Not on file  . Alcohol Use: No     Colonoscopy: 2012   PAP: 2012  Bone density: 2012  Lipid panel: [place]  Allergies  Allergen Reactions  . Adhesive (Tape)   . Bactrim (Sulfamethoxazole W-Trimethoprim)   . Sulfa Antibiotics     Current Outpatient Prescriptions  Medication Sig Dispense Refill  . albuterol (PROAIR HFA) 108 (  90 BASE) MCG/ACT inhaler Inhale 2 puffs into the lungs every 6 (six) hours as needed for wheezing.  3 Inhaler  0  . bismuth subsalicylate (PEPTO BISMOL) 262 MG/15ML suspension Take 15 mLs by mouth every 6 (six) hours as needed.      . Calcium Citrate-Vitamin D (CITRACAL + D PO) Take 2 tablets by mouth daily.      . Cholecalciferol (VITAMIN D-3) 1000 UNITS CAPS Take 2 capsules by mouth daily.      Marland Kitchen diltiazem (CARDIZEM CD) 240 MG 24 hr capsule Take 240 mg by mouth daily.      . diphenhydrAMINE (BENADRYL) 25 MG tablet Take 50 mg by mouth 4 (four) times daily.      Marland Kitchen DOXYCYCLINE  HYCLATE PO Take 1 capsule by mouth 2 (two) times daily.      . Flax OIL Take 1,000 mg by mouth 3 (three) times daily.       . fluticasone (FLOVENT HFA) 110 MCG/ACT inhaler Inhale 2 puffs into the lungs daily.  3 Inhaler  0  . furosemide (LASIX) 40 MG tablet Take 60 mg by mouth daily.       . Glucos-Chon-MSM-Ca-C-CtCl-SeCu TABS Take 1 tablet by mouth.      . levothyroxine (SYNTHROID, LEVOTHROID) 88 MCG tablet Take 88 mcg by mouth daily.      Marland Kitchen losartan (COZAAR) 100 MG tablet Take 100 mg by mouth daily.      . Multiple Vitamins-Minerals (MULTIVITAMIN WITH MINERALS) tablet Take 1 tablet by mouth daily.      . NON FORMULARY Mega b stress once a day      . Olopatadine HCl (PATANASE) 0.6 % SOLN Place 2 puffs into the nose as needed.      . Potassium Bicarbonate 99 MG CAPS Take 4 capsules by mouth 3 (three) times daily.      . sertraline (ZOLOFT) 100 MG tablet Take 150 mg by mouth daily.       Marland Kitchen zolpidem (AMBIEN) 10 MG tablet Take 10 mg by mouth at bedtime as needed.       No current facility-administered medications for this visit.    OBJECTIVE: Middle-aged white woman in no acute distress  Filed Vitals:   12/28/12 1117  BP: 153/81  Pulse: 68  Temp: 98 F (36.7 C)  Resp: 20     Body mass index is 45.82 kg/(m^2).    ECOG FS: 0  Sclerae unicteric Oropharynx clear No cervical or supraclavicular adenopathy Lungs no rales or rhonchi Heart regular rate and rhythm Abd obese, benign MSK no focal spinal tenderness, no peripheral edema Neuro: nonfocal, well oriented, pleasant affect Breasts:  the right breast is status post reduction mammoplasty. There are no suspicious findings. The left breast is status post modified radical mastectomy with implant reconstruction. There is no evidence of local recurrence. The left axilla is benign  LAB RESULTS: Lab Results  Component Value Date   WBC 4.2 12/28/2012   NEUTROABS 2.8 12/28/2012   HGB 13.9 12/28/2012   HCT 41.0 12/28/2012   MCV 99.3 12/28/2012    PLT 148 12/28/2012      Chemistry      Component Value Date/Time   NA 141 12/16/2011 1617      Component Value Date/Time   CALCIUM 10.1 12/16/2011 1617       Lab Results  Component Value Date   LABCA2 43* 12/16/2011    No components found with this basename: [id number]    No results found for this  basename: INR,  in the last 168 hours  Urinalysis No results found for this basename: colorurine,  appearanceur,  labspec,  phurine,  glucoseu,  hgbur,  bilirubinur,  ketonesur,  proteinur,  urobilinogen,  nitrite,  leukocytesur    STUDIES: Mm Digital Screening Unilat R  12/25/2012   *RADIOLOGY REPORT*  Clinical Data: Screening.  DIGITAL SCREENING UNILATERAL RIGHT MAMMOGRAM WITH CAD  Comparison: Previous exams.  FINDINGS:  ACR Breast Density Category a: The breast tissue is almost entirely fatty.  There are no findings suspicious for malignancy.  Images were processed with CAD.  IMPRESSION: No mammographic evidence of malignancy.  A result letter of this screening mammogram will be mailed directly to the patient.  RECOMMENDATION: Screening mammogram in one year. (Code:SM-B-01Y)  BI-RADS CATEGORY 1:  Negative.   Original Report Authenticated By: Baird Lyons, M.D.    ASSESSMENT: 72 y.o. [name] woman s/p Left modified radical mastectomy March 1988 for a pT1a pN0, stage IA microinvasive ductal breast cancer, estrogen receptor unknown, treated adjuvantly with cyclophosphamide/ methotrexate/ fluorouracil x6, without evidence of recurrence to date  PLAN: Conda understands that followup here is optional but it is "reassuring" to her, so we will continue to see her on a once a year basis indefinitely. If we get our survivorship clinic off the ground she would be a great candidate for it. She has a history of collagenous colitis which is currently inactive. She did not want referral to a gastrointestinal specialist today. She will return to see [place] in 1 year, after her mammogram in 2015. She knows  to call for any problems that may develop before that visit. Nirvana Blanchett C    12/28/2012

## 2012-12-29 LAB — VITAMIN D 25 HYDROXY (VIT D DEFICIENCY, FRACTURES): Vit D, 25-Hydroxy: 58 ng/mL (ref 30–89)

## 2012-12-29 LAB — CANCER ANTIGEN 27.29: CA 27.29: 33 U/mL (ref 0–39)

## 2013-01-02 ENCOUNTER — Telehealth: Payer: Self-pay | Admitting: Pulmonary Disease

## 2013-01-02 NOTE — Telephone Encounter (Signed)
Called the pharmacy and they are asking about the pts flovent.  Pharmacy stated that this is normally taken bid but this rx was sent in for once daily. VS please advise on how you would like the pt to take the flovent.  Please advise.  Thanks  Allergies  Allergen Reactions  . Adhesive (Tape)   . Bactrim (Sulfamethoxazole W-Trimethoprim)   . Sulfa Antibiotics

## 2013-01-03 NOTE — Telephone Encounter (Signed)
Please have her pharmacy dispense prescription as written for flovent once per day.

## 2013-01-03 NOTE — Telephone Encounter (Signed)
Called # and had to [facility] x1 as they were closed

## 2013-01-04 ENCOUNTER — Telehealth: Payer: Self-pay | Admitting: *Deleted

## 2013-01-04 NOTE — Telephone Encounter (Signed)
[facility] -- no answer Message received that their hours of operation are from 8-5pm [place] time, however still went to voicemail Left detailed msg on VM for call back

## 2013-01-08 MED ORDER — FLUTICASONE PROPIONATE HFA 110 MCG/ACT IN AERO: 2.0000 | INHALATION_SPRAY | Freq: Every day | RESPIRATORY_TRACT | Status: DC

## 2013-01-08 NOTE — Telephone Encounter (Signed)
Spoke with Sherilyn Cooter at [facility] and they need a new rx clarifying the instructions of Flovent 2 puffs daily.  Rx sent.

## 2013-01-11 ENCOUNTER — Ambulatory Visit (INDEPENDENT_AMBULATORY_CARE_PROVIDER_SITE_OTHER): Payer: Medicare Other | Admitting: Pulmonary Disease

## 2013-01-11 ENCOUNTER — Encounter: Payer: Self-pay | Admitting: Pulmonary Disease

## 2013-01-11 VITALS — BP 122/70 | HR 76 | Temp 98.0°F | Ht 63.0 in | Wt 259.6 lb

## 2013-01-11 DIAGNOSIS — J4530 Mild persistent asthma, uncomplicated: Secondary | ICD-10-CM

## 2013-01-11 DIAGNOSIS — G4733 Obstructive sleep apnea (adult) (pediatric): Secondary | ICD-10-CM

## 2013-01-11 DIAGNOSIS — J45909 Unspecified asthma, uncomplicated: Secondary | ICD-10-CM

## 2013-01-11 MED ORDER — OLOPATADINE HCL 0.6 % NA SOLN: 2.0000 | NASAL | Status: DC | PRN

## 2013-01-11 NOTE — Progress Notes (Signed)
Chief Complaint  Patient presents with  . Follow-up    Pt states her breathing has unchanged. Occasional wheezing and chest tx. No cough  . Sleep Apnea    Pt reports she is wearing her CPAP everynight x 9 hrs a night. Pt reports she is feeling rested during the day    History of Present Illness: [name] is a 72 y.o. female with sleep apnea and asthma.  Her breathing is doing well.  She is not having cough, wheeze, or sputum.  She is not needing to use proair.  She is reluctant to try to decrease use of flovent.  She uses her CPAP every night.  This works well, and helps a lot.  Tests: PSG 12/01/06>>AHI 10, SpO2 low 80%, PLMI 0  CPAP titration 02/16/07>>CPAP 11 cm H2O CPAP 04/09/12 to 05/08/12>>Used on 30 of 30 nights with average 10 hrs 54 min. Average AHI 1.6 with CPAP 11 cm H2O. Spirometry 06/06/12>>FEV1 1.48 (69%), FVC 1.95 (70%), FEV1% 76 PFT 07/27/12 >> FEV1 1.71 (88%), FEV1% 77, FEF 25-75% 1.44 (64%), TLC 3.58 (77%), DLCO 72%, no BD.  She  has a past medical history of Breast cancer; Hypertension; Asthma; Hypothyroidism; Sleep apnea; Collagenous colitis; Osteopenia; Osteoarthritis; and Anxiety.  She  has past surgical history that includes Modified radical mastectomy w/ axillary lymph node dissection; Breast reduction surgery; Toe Surgery; Septoplasty; Cesarean section; Tonsillectomy and adenoidectomy; right foot surgery; and Reconstruction breast w/ latissimus dorsi flap.   Current Outpatient Prescriptions on File Prior to Visit  Medication Sig Dispense Refill  . albuterol (PROAIR HFA) 108 (90 BASE) MCG/ACT inhaler Inhale 2 puffs into the lungs every 6 (six) hours as needed for wheezing.  3 Inhaler  0  . bismuth subsalicylate (PEPTO BISMOL) 262 MG/15ML suspension Take 15 mLs by mouth every 6 (six) hours as needed.      . Calcium Citrate-Vitamin D (CITRACAL + D PO) Take 2 tablets by mouth daily.      . Cholecalciferol (VITAMIN D-3) 1000 UNITS CAPS Take 2 capsules by mouth  daily.      Marland Kitchen diltiazem (CARDIZEM CD) 240 MG 24 hr capsule Take 240 mg by mouth daily.      . diphenhydrAMINE (BENADRYL) 25 MG tablet Take 50 mg by mouth 4 (four) times daily.      . Flax OIL Take 1,000 mg by mouth 3 (three) times daily.       . fluticasone (FLOVENT HFA) 110 MCG/ACT inhaler Inhale 2 puffs into the lungs daily.  3 Inhaler  1  . furosemide (LASIX) 40 MG tablet Take 60 mg by mouth daily.       . Glucos-Chon-MSM-Ca-C-CtCl-SeCu TABS Take 1 tablet by mouth.      . levothyroxine (SYNTHROID, LEVOTHROID) 88 MCG tablet Take 88 mcg by mouth daily.      Marland Kitchen losartan (COZAAR) 100 MG tablet Take 100 mg by mouth daily.      . Multiple Vitamins-Minerals (MULTIVITAMIN WITH MINERALS) tablet Take 1 tablet by mouth daily.      . NON FORMULARY Mega b stress once a day      . Olopatadine HCl (PATANASE) 0.6 % SOLN Place 2 puffs into the nose as needed.      . Potassium Bicarbonate 99 MG CAPS Take 4 capsules by mouth 3 (three) times daily.      . sertraline (ZOLOFT) 100 MG tablet Take 150 mg by mouth daily.       Marland Kitchen zolpidem (AMBIEN) 10 MG tablet Take 10 mg  by mouth at bedtime as needed.       No current facility-administered medications on file prior to visit.    Allergies  Allergen Reactions  . Adhesive (Tape)   . Bactrim (Sulfamethoxazole W-Trimethoprim)   . Sulfa Antibiotics    Physical Exam:  General - No distress  ENT - No sinus tenderness, no oral exudate, no LAN, MP 4, enlarged tongue Cardiac - s1s2 regular, no murmur Chest - No wheeze/rales/dullness Back - No focal tenderness  Abd - Soft, non-tender Ext - No edema  Neuro - Normal strength Skin - No rashes  Psych - Normal mood, and behavior   Assessment/Plan:  Abigail Helling, MD [facility] Pager:  [phone] 01/11/2013, 2:30 PM

## 2013-01-11 NOTE — Patient Instructions (Signed)
Follow up in 1 year.

## 2013-01-11 NOTE — Assessment & Plan Note (Signed)
Stable on flovent.  Discussed that she could try decreasing dose/stopping flovent >> she is planning to start exercise regimen, and does not want to change her medicine until she is sure she can tolerate exercising with worsening her asthma control.

## 2013-01-11 NOTE — Assessment & Plan Note (Signed)
She is compliant with therapy and reports benefit from CPAP. 

## 2013-01-16 ENCOUNTER — Other Ambulatory Visit: Payer: Self-pay | Admitting: Pulmonary Disease

## 2013-02-07 ENCOUNTER — Other Ambulatory Visit: Payer: Self-pay

## 2013-03-12 ENCOUNTER — Other Ambulatory Visit: Payer: Self-pay | Admitting: Endocrinology

## 2013-03-12 DIAGNOSIS — E049 Nontoxic goiter, unspecified: Secondary | ICD-10-CM

## 2013-03-14 ENCOUNTER — Ambulatory Visit
Admission: RE | Admit: 2013-03-14 | Discharge: 2013-03-14 | Disposition: A | Discharge status: Home / Self Care | Payer: Medicare Other | Source: Ambulatory Visit | Attending: Endocrinology | Admitting: Endocrinology

## 2013-03-14 DIAGNOSIS — E049 Nontoxic goiter, unspecified: Secondary | ICD-10-CM

## 2013-03-16 ENCOUNTER — Encounter: Payer: Self-pay | Admitting: Pulmonary Disease

## 2013-06-04 ENCOUNTER — Ambulatory Visit (INDEPENDENT_AMBULATORY_CARE_PROVIDER_SITE_OTHER): Payer: Medicare Other | Admitting: Surgery

## 2013-06-04 VITALS — BP 144/78 | HR 80 | Temp 98.6°F | Resp 20 | Ht 63.0 in | Wt 263.0 lb

## 2013-06-04 DIAGNOSIS — L02219 Cutaneous abscess of trunk, unspecified: Secondary | ICD-10-CM

## 2013-06-04 DIAGNOSIS — L02211 Cutaneous abscess of abdominal wall: Secondary | ICD-10-CM

## 2013-06-04 MED ORDER — HYDROCODONE-ACETAMINOPHEN 5-325 MG PO TABS: 1.0000 | ORAL_TABLET | Freq: Four times a day (QID) | ORAL | Status: DC | PRN

## 2013-06-04 NOTE — Progress Notes (Signed)
Subjective:     Patient ID: [name], female   DOB: [birth year], 72 y.o.   MRN: [id number]  HPI This is a pleasant female who is referred by Dr. Billee Cashing for evaluation of a right lower abdominal wall abscess. She has been placed on antibiotics and has had cultures taken. She noticed this area, for her abdominal wall last week. She has had abscesses in the past. This one spontaneously drained he then started getting increasing erythema so she presented to her primary care physician today.  Review of Systems     Objective:   Physical Exam She actually looks pretty good. She is comfortable. There is a 2 cm x 2 cm abdominal wound in the pannus of the right lower quadrant with surrounding induration and erythema. I prepped the area with ChloraPrep. I anesthetized with lidocaine. Then using a 15 blade scalpel I excised the 2 cm x 2 cm x 1 mm eschar. This exposed the subcutaneous fat and a small abscess cavity. There was no purulence. The area did not bleed. I then packed the wound with gauze. The abscess cavity itself was 1.5 cm in depth.    Assessment:     Right lower quadrant abdominal wall abscess     Plan:     She will go ahead and start her antibiotics. I will write her for hydrocodone. She will start wet-to-dry dressing changes with saline twice a day.  I will see her back This Thursday

## 2013-06-07 ENCOUNTER — Ambulatory Visit (INDEPENDENT_AMBULATORY_CARE_PROVIDER_SITE_OTHER): Payer: Medicare Other | Admitting: Surgery

## 2013-06-07 VITALS — BP 142/78 | HR 72 | Temp 97.7°F | Resp 18 | Ht 63.0 in | Wt 263.0 lb

## 2013-06-07 DIAGNOSIS — L02211 Cutaneous abscess of abdominal wall: Secondary | ICD-10-CM

## 2013-06-07 DIAGNOSIS — L02219 Cutaneous abscess of trunk, unspecified: Secondary | ICD-10-CM

## 2013-06-07 MED ORDER — HYDROCODONE-ACETAMINOPHEN 5-325 MG PO TABS: 1.0000 | ORAL_TABLET | ORAL | Status: DC | PRN

## 2013-06-07 NOTE — Progress Notes (Signed)
Subjective:     Patient ID: [name], female   DOB: 1941-04-04, 72 y.o.   MRN: [id number]  HPI She is here for a followup from 3 days ago for an abdominal wall abscess. She reports she is tolerating her antibiotics well. She is doing wet-to-dry dressing changes to the wound.  Review of Systems     Objective:   Physical Exam On exam, the erythema is much less. The wound is still the same size shows excellent granulation tissue with no purulence    Assessment:     Abdominal wall abscess     Plan:     As she is improving, we will hold off on admission and IV antibiotics. She will continue her course of oral antibiotics and wound care and I will see her back next week. I will renew her hydrocodone

## 2013-06-15 ENCOUNTER — Ambulatory Visit (INDEPENDENT_AMBULATORY_CARE_PROVIDER_SITE_OTHER): Payer: Medicare Other | Admitting: Surgery

## 2013-06-15 ENCOUNTER — Encounter (INDEPENDENT_AMBULATORY_CARE_PROVIDER_SITE_OTHER): Payer: Self-pay | Admitting: Surgery

## 2013-06-15 VITALS — BP 140/80 | HR 80 | Temp 98.4°F | Resp 14 | Ht 63.0 in | Wt 265.6 lb

## 2013-06-15 DIAGNOSIS — L02219 Cutaneous abscess of trunk, unspecified: Secondary | ICD-10-CM

## 2013-06-15 DIAGNOSIS — L02211 Cutaneous abscess of abdominal wall: Secondary | ICD-10-CM

## 2013-06-15 MED ORDER — DOXYCYCLINE HYCLATE 100 MG PO CAPS: 100.0000 mg | ORAL_CAPSULE | Freq: Two times a day (BID) | ORAL | Status: DC

## 2013-06-15 NOTE — Progress Notes (Signed)
Subjective:     Patient ID: [name], female   DOB: 04/22/1941, 72 y.o.   MRN: [id number]  HPI She is here for another evaluation of her abdominal wall abscess. She reports some discomfort but is otherwise doing well. She did develop a rash on her chest earlier today  Review of Systems     Objective:   Physical Exam There are a couple of red areas on her chest which may be hives.  Her abdominal wall abscess looks excellent. There is no erythema and minimal induration. There is still a 1-1/2 cm round area with granulation tissue that I treated with silver nitrate    Assessment:     Abdominal wall abscess improved     Plan:     She will continue her current wound care and doxycycline. I will see her back after Christmas

## 2013-06-18 ENCOUNTER — Encounter (INDEPENDENT_AMBULATORY_CARE_PROVIDER_SITE_OTHER): Payer: Self-pay

## 2013-06-22 ENCOUNTER — Encounter (INDEPENDENT_AMBULATORY_CARE_PROVIDER_SITE_OTHER): Payer: Self-pay

## 2013-07-03 ENCOUNTER — Ambulatory Visit (INDEPENDENT_AMBULATORY_CARE_PROVIDER_SITE_OTHER): Payer: Medicare Other | Admitting: Surgery

## 2013-07-03 ENCOUNTER — Encounter (INDEPENDENT_AMBULATORY_CARE_PROVIDER_SITE_OTHER): Payer: Self-pay

## 2013-07-03 ENCOUNTER — Encounter (INDEPENDENT_AMBULATORY_CARE_PROVIDER_SITE_OTHER): Payer: Self-pay | Admitting: Surgery

## 2013-07-03 VITALS — BP 129/84 | HR 74 | Temp 97.7°F | Resp 18 | Ht 63.0 in | Wt 268.8 lb

## 2013-07-03 DIAGNOSIS — L02211 Cutaneous abscess of abdominal wall: Secondary | ICD-10-CM

## 2013-07-03 DIAGNOSIS — L02219 Cutaneous abscess of trunk, unspecified: Secondary | ICD-10-CM

## 2013-07-03 NOTE — Progress Notes (Signed)
Subjective:     Patient ID: [name], female   DOB: 1940-12-01, 72 y.o.   MRN: [id number]  HPI She is here for final wound check. She has been off antibiotics for a week and is doing well. She reports no drainage  Review of Systems     Objective:   Physical Exam The wound as it was completely healed. There is no evidence of infection    Assessment:     Healing right lower quadrant abdominal wall wound     Plan:     She will continue her current care and will see me as needed

## 2013-07-04 NOTE — ED Provider Notes (Signed)
Order(s) created erroneously. Erroneous order ID: [id number] Order moved by: NEWCOMB, RALPH A Order move date/time: 07/04/2013 11:33 AM Source Patient:   [id number] Source Contact: 06/22/2013 Destination Patient:   [id number] Destination Contact: 09/19/2012

## 2013-09-25 ENCOUNTER — Other Ambulatory Visit: Payer: Self-pay | Admitting: Endocrinology

## 2013-09-25 DIAGNOSIS — E049 Nontoxic goiter, unspecified: Secondary | ICD-10-CM

## 2013-09-26 ENCOUNTER — Ambulatory Visit
Admission: RE | Admit: 2013-09-26 | Discharge: 2013-09-26 | Disposition: A | Discharge status: Home / Self Care | Payer: Medicare Other | Source: Ambulatory Visit | Attending: Endocrinology | Admitting: Endocrinology

## 2013-09-26 DIAGNOSIS — E049 Nontoxic goiter, unspecified: Secondary | ICD-10-CM

## 2013-10-11 ENCOUNTER — Telehealth: Payer: Self-pay | Admitting: Physician Assistant

## 2013-10-11 NOTE — Telephone Encounter (Signed)
per AB sch chge to am/cld and spoke with pt adv of new time-pt understood

## 2013-12-03 ENCOUNTER — Other Ambulatory Visit: Payer: Self-pay

## 2013-12-03 DIAGNOSIS — Z1231 Encounter for screening mammogram for malignant neoplasm of breast: Secondary | ICD-10-CM

## 2013-12-04 ENCOUNTER — Other Ambulatory Visit: Payer: Self-pay | Admitting: Physician Assistant

## 2013-12-04 DIAGNOSIS — E2839 Other primary ovarian failure: Secondary | ICD-10-CM

## 2013-12-21 ENCOUNTER — Ambulatory Visit
Admission: RE | Admit: 2013-12-21 | Discharge: 2013-12-21 | Disposition: A | Discharge status: Home / Self Care | Payer: Medicare Other | Source: Ambulatory Visit

## 2013-12-21 ENCOUNTER — Ambulatory Visit
Admission: RE | Admit: 2013-12-21 | Discharge: 2013-12-21 | Disposition: A | Discharge status: Home / Self Care | Payer: Medicare Other | Source: Ambulatory Visit | Attending: Physician Assistant | Admitting: Physician Assistant

## 2013-12-21 ENCOUNTER — Ambulatory Visit: Payer: Medicare Other

## 2013-12-21 DIAGNOSIS — Z1231 Encounter for screening mammogram for malignant neoplasm of breast: Secondary | ICD-10-CM

## 2013-12-21 DIAGNOSIS — E2839 Other primary ovarian failure: Secondary | ICD-10-CM

## 2013-12-27 ENCOUNTER — Other Ambulatory Visit: Payer: Self-pay | Admitting: *Deleted

## 2013-12-27 DIAGNOSIS — C50919 Malignant neoplasm of unspecified site of unspecified female breast: Secondary | ICD-10-CM

## 2013-12-28 ENCOUNTER — Other Ambulatory Visit (HOSPITAL_BASED_OUTPATIENT_CLINIC_OR_DEPARTMENT_OTHER): Payer: Medicare Other

## 2013-12-28 ENCOUNTER — Ambulatory Visit (HOSPITAL_BASED_OUTPATIENT_CLINIC_OR_DEPARTMENT_OTHER): Payer: Medicare Other | Admitting: Physician Assistant

## 2013-12-28 ENCOUNTER — Other Ambulatory Visit: Payer: Medicare Other

## 2013-12-28 ENCOUNTER — Encounter: Payer: Self-pay | Admitting: Physician Assistant

## 2013-12-28 ENCOUNTER — Ambulatory Visit: Payer: Medicare Other | Admitting: Physician Assistant

## 2013-12-28 ENCOUNTER — Telehealth: Payer: Self-pay | Admitting: Oncology

## 2013-12-28 VITALS — BP 131/82 | HR 60 | Temp 97.7°F | Resp 18 | Ht 63.0 in | Wt 266.9 lb

## 2013-12-28 DIAGNOSIS — Z853 Personal history of malignant neoplasm of breast: Secondary | ICD-10-CM

## 2013-12-28 DIAGNOSIS — C50919 Malignant neoplasm of unspecified site of unspecified female breast: Secondary | ICD-10-CM

## 2013-12-28 DIAGNOSIS — M8580 Other specified disorders of bone density and structure, unspecified site: Secondary | ICD-10-CM

## 2013-12-28 LAB — COMPREHENSIVE METABOLIC PANEL (CC13)
ALT: 23 U/L (ref 0–55)
ANION GAP: 10 meq/L (ref 3–11)
AST: 21 U/L (ref 5–34)
Albumin: 3.7 g/dL (ref 3.5–5.0)
Alkaline Phosphatase: 87 U/L (ref 40–150)
BILIRUBIN TOTAL: 0.59 mg/dL (ref 0.20–1.20)
BUN: 19.4 mg/dL (ref 7.0–26.0)
CO2: 29 mEq/L (ref 22–29)
Calcium: 9.7 mg/dL (ref 8.4–10.4)
Chloride: 104 mEq/L (ref 98–109)
Creatinine: 0.8 mg/dL (ref 0.6–1.1)
GLUCOSE: 80 mg/dL (ref 70–140)
Potassium: 3.9 mEq/L (ref 3.5–5.1)
Sodium: 142 mEq/L (ref 136–145)
Total Protein: 6.8 g/dL (ref 6.4–8.3)

## 2013-12-28 LAB — CBC WITH DIFFERENTIAL/PLATELET
BASO%: 0.4 % (ref 0.0–2.0)
BASOS ABS: 0 10*3/uL (ref 0.0–0.1)
EOS ABS: 0.4 10*3/uL (ref 0.0–0.5)
EOS%: 6.5 % (ref 0.0–7.0)
HCT: 41.2 % (ref 34.8–46.6)
HGB: 13.5 g/dL (ref 11.6–15.9)
LYMPH%: 22.1 % (ref 14.0–49.7)
MCH: 32.7 pg (ref 25.1–34.0)
MCHC: 32.7 g/dL (ref 31.5–36.0)
MCV: 100 fL (ref 79.5–101.0)
MONO#: 0.5 10*3/uL (ref 0.1–0.9)
MONO%: 9.3 % (ref 0.0–14.0)
NEUT%: 61.7 % (ref 38.4–76.8)
NEUTROS ABS: 3.3 10*3/uL (ref 1.5–6.5)
PLATELETS: 169 10*3/uL (ref 145–400)
RBC: 4.12 10*6/uL (ref 3.70–5.45)
RDW: 13.7 % (ref 11.2–14.5)
WBC: 5.4 10*3/uL (ref 3.9–10.3)
lymph#: 1.2 10*3/uL (ref 0.9–3.3)

## 2013-12-28 NOTE — Progress Notes (Signed)
ID: [name]   DOB: [birth year]  MR#: [id number]  CSN#:[id number]  PCP: Abigail Noon, MD GYN: SU:  OTHER MD:   HISTORY OF PRESENT ILLNESS: [name] had mammography early in 1988, showing a 5 mm mass in her left breast. I do not have details of further workup of this but in March of 1988 she underwent left modified radical mastectomy for what proved to be a multifocal microscopic invasive ductal breast cancer. There was not enough tissue for estrogen receptor determination. She was treated adjuvantly with CMF x6, which she tolerated well.  Subsequently she underwent reconstruction involving a latissimus flap, which unfortunately necrosed. She later had an implant placed. She had a right reduction mammoplasty for symmetry.   Her subsequent history is as detailed below.  INTERVAL HISTORY: [name] returns alone today for routine annual followup of her remote left breast cancer. As he is feeling well, her only complaint today being some residual pain in the right knee, having recently undergone arthroscopic surgery. She does have some chronic shortness of breath with exertion associated with asthma, but this is stable.  Interval history is notable for the fact that [name]'s first grandson, [name], is now 46 weeks old and lives in [place].  She is already made the trip once to visit, held him for hours at a time, and hopes to return again soon!   REVIEW OF SYSTEMS: Otherwise, [name] has had no recent illnesses and denies any fevers, chills, night sweats, or hot flashes. She's had no skin changes or rashes and denies any abnormal bruising or bleeding. Her energy level is fairly good. Her appetite is good, and she denies any nausea or change in bowel or bladder habits. She's had no increased cough, phlegm production, chest pain, or palpitations. She's had no increased peripheral swelling. She does have some arthritic pain which is chronic. She's had no abnormal headaches or dizziness.  A detailed review  of systems is otherwise stable and noncontributory.    PAST MEDICAL HISTORY: Past Medical History  Diagnosis Date  . Breast cancer   . Hypertension   . Asthma   . Hypothyroidism   . Sleep apnea   . Collagenous colitis   . Osteopenia   . Osteoarthritis   . Anxiety     PAST SURGICAL HISTORY: Past Surgical History  Procedure Laterality Date  . Modified radical mastectomy w/ axillary lymph node dissection      Left  . Breast reduction surgery      Right  . Toe surgery    . Septoplasty    . Cesarean section    . Tonsillectomy and adenoidectomy    . Right foot surgery      corrected hammer toe, straightened big toe  . Reconstruction breast w/ latissimus dorsi flap      left    FAMILY HISTORY The patient's father died at the age of 31. Her mother died at the age of 90. The patient has one full brother, one half-brother, and one half-sister. The patient is known to be BRCA1 and 2 negative by large rearrangement or specific mutation tests.  GYNECOLOGIC HISTORY: (Reviewed 12/28/2013) Menarche age 4. First live birth age 33. The patient is GX P1. She underwent early menopause at the age of 69 and took estrogen replacement for several years, switched to progesterone cream when she was diagnosed with breast cancer.   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 Her husband [name] is a former of [employer] man,  now retired. Son [name] works in Risk analyst. Daughter [name] in [place], [place] and works as a Wellsite geologist of a [employer]. [name] had her first child [name]") in May 2015. She is not a Ambulance person.    ADVANCED DIRECTIVES: not in place   HEALTH MAINTENANCE: (Updated 12/28/2013) History  Substance Use Topics  . Smoking status: Never Smoker   . Smokeless tobacco: Not on file  . Alcohol Use: No     Colonoscopy: 2012   PAP: 2012  Bone density: 12/11/2013, osteopenia, T score  -1.3  Lipid panel: Not on file/Dr. Melford Aase  Allergies  Allergen Reactions  . Adhesive [Tape]   . Bactrim [Sulfamethoxazole-Trimethoprim]   . Sulfa Antibiotics     Current Outpatient Prescriptions  Medication Sig Dispense Refill  . Calcium Citrate-Vitamin D (CITRACAL + D PO) Take 2 tablets by mouth daily.      . Cholecalciferol (VITAMIN D-3) 1000 UNITS CAPS Take 2 capsules by mouth daily.      Marland Kitchen dextromethorphan-guaiFENesin (MUCINEX DM) 30-600 MG per 12 hr tablet Take 1 tablet by mouth every 12 (twelve) hours.      Marland Kitchen diltiazem (CARDIZEM CD) 240 MG 24 hr capsule Take 240 mg by mouth daily.      . diphenhydrAMINE (BENADRYL) 25 MG tablet Take 50 mg by mouth 4 (four) times daily.      . Flax OIL Take 1,000 mg by mouth 3 (three) times daily.       . fluticasone (FLOVENT HFA) 110 MCG/ACT inhaler Inhale 2 puffs into the lungs daily.  3 Inhaler  1  . furosemide (LASIX) 40 MG tablet Take 60 mg by mouth daily.       . Glucos-Chon-MSM-Ca-C-CtCl-SeCu TABS Take 1 tablet by mouth.      . levothyroxine (SYNTHROID, LEVOTHROID) 88 MCG tablet Take 88 mcg by mouth daily.      Marland Kitchen losartan (COZAAR) 100 MG tablet Take 100 mg by mouth daily.      . Multiple Vitamins-Minerals (MULTIVITAMIN WITH MINERALS) tablet Take 1 tablet by mouth daily.      . NON FORMULARY Mega b stress once a day      . Olopatadine HCl (PATANASE) 0.6 % SOLN Place 2 drops (2 puffs total) into the nose as needed.  3 Bottle  3  . Potassium Bicarbonate 99 MG CAPS Take 4 capsules by mouth 3 (three) times daily.      . sertraline (ZOLOFT) 100 MG tablet Take 150 mg by mouth daily.       Marland Kitchen zolpidem (AMBIEN) 10 MG tablet Take 10 mg by mouth at bedtime as needed.      Marland Kitchen albuterol (PROAIR HFA) 108 (90 BASE) MCG/ACT inhaler Inhale 2 puffs into the lungs every 6 (six) hours as needed for wheezing.  3 Inhaler  0  . bismuth subsalicylate (PEPTO BISMOL) 262 MG/15ML suspension Take 15 mLs by mouth every 6 (six) hours as needed.       No current  facility-administered medications for this visit.    OBJECTIVE: Middle-aged white woman who appears comfortable and is in no acute distress  Filed Vitals:   12/28/13 1007  BP: 131/82  Pulse: 60  Temp: 97.7 F (36.5 C)  Resp: 18     Body mass index is 47.29 kg/(m^2).    ECOG FS: 0 Filed Weights   12/28/13 1007  Weight: 266 lb 14.4 oz (121.065 kg)   Physical Exam: HEENT:  Sclerae anicteric.  Oropharynx clear. No mucositis or candidiasis. Buccal  mucosa is pink and moist. Neck supple, trachea midline. NODES:  No cervical or supraclavicular lymphadenopathy palpated.  BREAST EXAM: Right breast is status post reduction mammoplasty and is otherwise benign. Patient is status post left mastectomy with reconstruction. No suspicious nodularites, no evidence of local recurrence.  There is a small erythematous area, approximately 1-2 cm in length in the inframammary fold, consistent with a yeast/fungal skin infection. There no vesicles or pustules, and no additional skin changes noted on the breast itself. Axillae are benign bilaterally, with no palpable lymphadenopathy. LUNGS:  Clear to auscultation bilaterally with fair excursion. No crackles, wheezes or rhonchi HEART:  Regular rate and rhythm. No murmur appreciated. ABDOMEN:  Soft, obese, nontender.  Positive bowel sounds.  MSK:  No focal spinal tenderness to palpation. Good range of motion bilaterally in the upper extremities. EXTREMITIES:  No peripheral edema.  No lymphedema in the left upper extremity. SKIN:  No visible rashes other than the apparent fungal rash noted above, involving the left inframammary fold. No excessive ecchymoses. No petechiae. No pallor. NEURO:  Nonfocal. Well oriented.  Appropriate affect.   LAB RESULTS: Lab Results  Component Value Date   WBC 5.4 12/28/2013   NEUTROABS 3.3 12/28/2013   HGB 13.5 12/28/2013   HCT 41.2 12/28/2013   MCV 100.0 12/28/2013   PLT 169 12/28/2013      Chemistry      Component Value  Date/Time   NA 142 12/28/2013 0952   NA 141 12/16/2011 1617      Component Value Date/Time   CALCIUM 9.7 12/28/2013 0952   CALCIUM 10.1 12/16/2011 1617       STUDIES:  Dg Bone Density 12/21/2013   CLINICAL DATA:  Postmenopausal. The patient takes vitamin-D and calcium supplements. History of steroids in the past related to chemotherapy for breast cancer.  EXAM: DUAL X-RAY ABSORPTIOMETRY (DXA) FOR BONE MINERAL DENSITY  FINDINGS: AP LUMBAR SPINE L1-L4  Bone Mineral Density (BMD):  0.937 g/cm2  Young Adult T-Score:  -1.0  Z-Score:  1.1  Left FEMUR neck  Bone Mineral Density (BMD):  0.710 g/cm2  Young Adult T-Score: -1.3  Z-Score:  0.6  ASSESSMENT: Patient's diagnostic category is osteopenia/low bone mass by WHO Criteria.  FRACTURE RISK: Increased  FRAX: Based on the [place] model, the 10 year probability of a major osteoporotic fracture is 19%. The 10 year probability of a hip fracture is 2.3%.  COMPARISON: 12/17/2011:  Lumbar spine no significant change since the previous study.  Total hip:  No significant change since the previous study.  Effective therapies are available in the form of bisphosphonates, selective estrogen receptor modulators, biologic agents, and hormone replacement therapy (for women). All patients should ensure an adequate intake of dietary calcium (1200 mg daily) and vitamin D (800 IU daily) unless contraindicated.  All treatment decisions require clinical judgment and consideration of individual patient factors, including patient preferences, co-morbidities, previous drug use, risk factors not captured in the FRAX model (e.g., frailty, falls, vitamin D deficiency, increased bone turnover, interval significant decline in bone density) and possible under- or over-estimation of fracture risk by FRAX.  The National Osteoporosis Foundation recommends that FDA-approved medical therapies be considered in postmenopausal women and men age 38 or older with a:  1. Hip or  vertebral (clinical or morphometric) fracture.  2. T-score of -2.5 or lower at the spine or hip.  3. Ten-year fracture probability by FRAX of 3% or greater for hip fracture or 20% or greater for major osteoporotic  fracture.  People with diagnosed cases of osteoporosis or at high risk for fracture should have regular bone mineral density tests. For patients eligible for Medicare, routine testing is allowed once every 2 years. The testing frequency can be increased to one year for patients who have rapidly progressing disease, those who are receiving or discontinuing medical therapy to restore bone mass, or have additional risk factors.  World Pharmacologist [facility]) Criteria:  Normal: T-scores from +1.0 to -1.0  Low Bone Mass (Osteopenia): T-scores between -1.0 and -2.5  Osteoporosis: T-scores -2.5 and below  Comparison to Reference Population:  T-score is the key measure used in the diagnosis of osteoporosis and relative risk determination for fracture. It provides a value for bone mass relative to the mean bone mass of a young adult reference population expressed in terms of standard deviation (SD).  Z-score is the age-matched score showing the patient's values compared to a population matched for age, sex, and race. This is also expressed in terms of standard deviation. The patient may have values that compare favorably to the age-matched values and still be at increased risk for fracture.   Electronically Signed   By: Shon Hale M.D.   On: 12/21/2013 17:04   Mm Screening Breast Tomo Uni R 12/21/2013   CLINICAL DATA:  Screening.  EXAM: DIGITAL SCREENING UNILATERAL RIGHT MAMMOGRAM WITH TOMO AND CAD  COMPARISON:  Previous exam(s).  ACR Breast Density Category b: There are scattered areas of fibroglandular density.  FINDINGS: There are no findings suspicious for malignancy. Images were processed with CAD.  IMPRESSION: No mammographic evidence of malignancy. A result letter of this screening mammogram will be  mailed directly to the patient.  RECOMMENDATION: Screening mammogram in one year. (Code:SM-B-01Y)  BI-RADS CATEGORY  1: Negative.   Electronically Signed   By: Marin Olp M.D.   On: 12/21/2013 17:13     ASSESSMENT: 73 y.o. [place] woman   (1)  s/p Left modified radical mastectomy March 1988 for a pT1a pN0, stage IA microinvasive ductal breast cancer, estrogen receptor unknown,   (2)  treated adjuvantly with cyclophosphamide/ methotrexate/ fluorouracil x6, without evidence of recurrence to date  (3)  currently followed with observation alone on an annual basis per patient request  PLAN: Angle appears to be doing well with regards to her breast cancer history. For her "reassurance" she would like to continue being seen here on an annual basis, and accordingly we will schedule her for labs and physical exam next June 2016 soon after her next annual mammogram. Once our survivorship clinic has been established, she would in fact be a great candidate.  Otherwise, the only other suggestion I made today was for her to try an antifungal cream or powder in the inframammary fold under the left breast where she seems to have developed a slight fungal or yeast infection, likely associated with the recent heat. I have advised her to keep her eye on that area and make sure it improves. She should contact us, or her primary care physician, if it worsens.  Jaklyn voices both her understanding and agreement with our plan as detailed above. She will call with any changes or problems prior to her appointment here next June.   BERRY,AMY PA-C     12/28/2013

## 2013-12-28 NOTE — Telephone Encounter (Signed)
[facility] APPT SCHEDULE FOR JUNE 2016.

## 2014-01-03 ENCOUNTER — Other Ambulatory Visit: Payer: Self-pay | Admitting: Pulmonary Disease

## 2014-01-11 ENCOUNTER — Ambulatory Visit (INDEPENDENT_AMBULATORY_CARE_PROVIDER_SITE_OTHER): Payer: Medicare Other | Admitting: Pulmonary Disease

## 2014-01-11 ENCOUNTER — Encounter: Payer: Self-pay | Admitting: Pulmonary Disease

## 2014-01-11 VITALS — BP 140/82 | HR 84 | Temp 98.0°F | Ht 63.0 in | Wt 279.0 lb

## 2014-01-11 DIAGNOSIS — R0989 Other specified symptoms and signs involving the circulatory and respiratory systems: Secondary | ICD-10-CM

## 2014-01-11 DIAGNOSIS — J45909 Unspecified asthma, uncomplicated: Secondary | ICD-10-CM

## 2014-01-11 DIAGNOSIS — Z9989 Dependence on other enabling machines and devices: Principal | ICD-10-CM

## 2014-01-11 DIAGNOSIS — R0609 Other forms of dyspnea: Secondary | ICD-10-CM

## 2014-01-11 DIAGNOSIS — J4530 Mild persistent asthma, uncomplicated: Secondary | ICD-10-CM

## 2014-01-11 DIAGNOSIS — G4733 Obstructive sleep apnea (adult) (pediatric): Secondary | ICD-10-CM

## 2014-01-11 NOTE — Assessment & Plan Note (Signed)
She is compliant with therapy and reports benefit from CPAP.  Will get copy of her download.

## 2014-01-11 NOTE — Assessment & Plan Note (Signed)
Likely related to deconditioning.  Discussed importance of maintaining a regular exercise regimen.

## 2014-01-11 NOTE — Progress Notes (Signed)
Chief Complaint  Patient presents with  . Follow-up    Wears CPAP nightly. Denies problems with mask/pressure. Pt c/o increase SOB.     History of Present Illness: [name] is a 73 y.o. 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  I last saw her in December 2013.  She has been doing well with CPAP.  She has no issue with mask.  She has full face mask.  She is not having cough, wheeze, or sputum.  She had knee problems recently, and has not been able to keep up with her activities.  She ended up having surgery.  She has noticed more trouble with her breathing walking up hills since she has not been as active.  TESTS: PSG 12/01/06>>AHI 10, SpO2 low 80%, PLMI 0  CPAP titration 02/16/07>>CPAP 11 cm H2O  CPAP 04/09/12 to 05/08/12>>Used on 30 of 30 nights with average 10 hrs 54 min. Average AHI 1.6 with CPAP 11 cm H2O.  Spirometry 06/06/12>>FEV1 1.48 (69%), FVC 1.95 (70%), FEV1% 76 PFT 07/27/12 >> FEV1 1.71 (88%), FEV1% 77, FEF 25-75% 1.44 (64%), TLC 3.58 (77%), DLCO 72%, no BD.   [name]  has a past medical history of Breast cancer; Hypertension; Asthma; Hypothyroidism; Sleep apnea; Collagenous colitis; Osteopenia; Osteoarthritis; and Anxiety.  [name]  has past surgical history that includes Modified radical mastectomy w/ axillary lymph node dissection; Breast reduction surgery; Toe Surgery; Septoplasty; Cesarean section; Tonsillectomy and adenoidectomy; right foot surgery; and Reconstruction breast w/ latissimus dorsi flap.  Prior to Admission medications   Medication Sig Start Date End Date Taking? Authorizing Provider  albuterol (PROAIR HFA) 108 (90 BASE) MCG/ACT inhaler Inhale 2 puffs into the lungs every 6 (six) hours as needed for wheezing. 11/23/12   Chesley Mires, MD  bismuth subsalicylate (PEPTO BISMOL) 262 MG/15ML suspension Take 15 mLs by mouth every 6 (six) hours as needed.    Historical Provider, MD  Calcium Citrate-Vitamin D (CITRACAL + D PO) Take 2  tablets by mouth daily.    Historical Provider, MD  Cholecalciferol (VITAMIN D-3) 1000 UNITS CAPS Take 2 capsules by mouth daily.    Historical Provider, MD  dextromethorphan-guaiFENesin (MUCINEX DM) 30-600 MG per 12 hr tablet Take 1 tablet by mouth every 12 (twelve) hours.    Historical Provider, MD  diltiazem (CARDIZEM CD) 240 MG 24 hr capsule Take 240 mg by mouth daily.    Historical Provider, MD  diphenhydrAMINE (BENADRYL) 25 MG tablet Take 50 mg by mouth 4 (four) times daily.    Historical Provider, MD  Flax OIL Take 1,000 mg by mouth 3 (three) times daily.     Historical Provider, MD  FLOVENT HFA 110 MCG/ACT inhaler INHALE 2 PUFFS INTO THE LUNGS DAILY 01/03/14   Chesley Mires, MD  furosemide (LASIX) 40 MG tablet Take 60 mg by mouth daily.     Historical Provider, MD  Glucos-Chon-MSM-Ca-C-CtCl-SeCu TABS Take 1 tablet by mouth.    Historical Provider, MD  levothyroxine (SYNTHROID, LEVOTHROID) 88 MCG tablet Take 88 mcg by mouth daily.    Historical Provider, MD  losartan (COZAAR) 100 MG tablet Take 100 mg by mouth daily.    Historical Provider, MD  Multiple Vitamins-Minerals (MULTIVITAMIN WITH MINERALS) tablet Take 1 tablet by mouth daily.    Historical Provider, MD  NON FORMULARY Mega b stress once a day    Historical Provider, MD  Olopatadine HCl (PATANASE) 0.6 % SOLN Place 2 drops (2 puffs total) into the nose as needed. 01/11/13   Sayuri Rhames  Dajah Fischman, MD  Potassium Bicarbonate 99 MG CAPS Take 4 capsules by mouth 3 (three) times daily.    Historical Provider, MD  sertraline (ZOLOFT) 100 MG tablet Take 150 mg by mouth daily.     Historical Provider, MD  zolpidem (AMBIEN) 10 MG tablet Take 10 mg by mouth at bedtime as needed.    Historical Provider, MD    Allergies  Allergen Reactions  . Adhesive [Tape]   . Bactrim [Sulfamethoxazole-Trimethoprim]   . Sulfa Antibiotics      Physical Exam:  General - No distress ENT - No sinus tenderness, no oral exudate, no LAN, MP 2 Cardiac - s1s2 regular, no  murmur Chest - No wheeze/rales/dullness Back - No focal tenderness Abd - Soft, non-tender Ext - No edema Neuro - Normal strength Skin - No rashes Psych - normal mood, and behavior   Assessment/Plan:  Chesley Mires, MD [facility] Pager:  [phone]

## 2014-01-11 NOTE — Assessment & Plan Note (Signed)
Continue flovent with prn albuterol.

## 2014-01-11 NOTE — Patient Instructions (Signed)
Will get copy of CPAP report and call with results Follow up in 1 year 

## 2014-01-28 ENCOUNTER — Telehealth: Payer: Self-pay | Admitting: Pulmonary Disease

## 2014-01-28 NOTE — Telephone Encounter (Signed)
CPAP 12/17/13 to 01/15/14 >> used on 30 of 03 nights with average 10 hrs and 17 min.  Average AHI is 1.8 with CPAP 11 cm H2O.  Will have my nurse inform pt that CPAP report looks good.  No change to current set up.

## 2014-01-30 NOTE — Telephone Encounter (Signed)
Results have been explained to patient, pt expressed understanding. Nothing further needed.  

## 2014-04-14 ENCOUNTER — Other Ambulatory Visit: Payer: Self-pay | Admitting: Pulmonary Disease

## 2014-04-18 ENCOUNTER — Telehealth: Payer: Self-pay | Admitting: Pulmonary Disease

## 2014-04-18 MED ORDER — ALBUTEROL SULFATE HFA 108 (90 BASE) MCG/ACT IN AERS: 2.0000 | INHALATION_SPRAY | Freq: Four times a day (QID) | RESPIRATORY_TRACT | Status: DC | PRN

## 2014-04-18 MED ORDER — FLUTICASONE PROPIONATE HFA 110 MCG/ACT IN AERO: INHALATION_SPRAY | RESPIRATORY_TRACT | Status: DC

## 2014-04-18 NOTE — Telephone Encounter (Signed)
Called and spoke with pt and she is aware of refills that have been sent to the pharmacy.  

## 2014-04-23 ENCOUNTER — Other Ambulatory Visit: Payer: Self-pay | Admitting: Pulmonary Disease

## 2014-08-13 ENCOUNTER — Other Ambulatory Visit: Payer: Self-pay | Admitting: Pulmonary Disease

## 2014-09-16 ENCOUNTER — Other Ambulatory Visit: Payer: Self-pay | Admitting: *Deleted

## 2014-09-16 MED ORDER — OLOPATADINE HCL 0.6 % NA SOLN: 2.0000 | NASAL | Status: DC | PRN

## 2014-10-01 ENCOUNTER — Other Ambulatory Visit: Payer: Self-pay | Admitting: Endocrinology

## 2014-10-01 DIAGNOSIS — E049 Nontoxic goiter, unspecified: Secondary | ICD-10-CM

## 2014-11-02 ENCOUNTER — Other Ambulatory Visit: Payer: Self-pay | Admitting: Pulmonary Disease

## 2014-11-19 ENCOUNTER — Other Ambulatory Visit: Payer: Self-pay

## 2014-11-19 DIAGNOSIS — Z1231 Encounter for screening mammogram for malignant neoplasm of breast: Secondary | ICD-10-CM

## 2014-12-17 ENCOUNTER — Other Ambulatory Visit (HOSPITAL_BASED_OUTPATIENT_CLINIC_OR_DEPARTMENT_OTHER): Payer: Medicare Other

## 2014-12-17 ENCOUNTER — Telehealth: Payer: Self-pay | Admitting: Oncology

## 2014-12-17 ENCOUNTER — Ambulatory Visit (HOSPITAL_BASED_OUTPATIENT_CLINIC_OR_DEPARTMENT_OTHER): Payer: Medicare Other | Admitting: Oncology

## 2014-12-17 VITALS — BP 135/66 | HR 63 | Temp 98.1°F | Resp 18 | Ht 63.0 in | Wt 273.6 lb

## 2014-12-17 DIAGNOSIS — Z853 Personal history of malignant neoplasm of breast: Secondary | ICD-10-CM | POA: Diagnosis present

## 2014-12-17 DIAGNOSIS — C50912 Malignant neoplasm of unspecified site of left female breast: Secondary | ICD-10-CM

## 2014-12-17 LAB — COMPREHENSIVE METABOLIC PANEL (CC13)
ALBUMIN: 3.6 g/dL (ref 3.5–5.0)
ALK PHOS: 107 U/L (ref 40–150)
ALT: 26 U/L (ref 0–55)
ANION GAP: 13 meq/L — AB (ref 3–11)
AST: 25 U/L (ref 5–34)
BILIRUBIN TOTAL: 0.46 mg/dL (ref 0.20–1.20)
BUN: 16.9 mg/dL (ref 7.0–26.0)
CO2: 25 meq/L (ref 22–29)
Calcium: 9.8 mg/dL (ref 8.4–10.4)
Chloride: 105 mEq/L (ref 98–109)
Creatinine: 0.9 mg/dL (ref 0.6–1.1)
EGFR: 65 mL/min/{1.73_m2} — AB (ref >90)
GLUCOSE: 103 mg/dL (ref 70–140)
POTASSIUM: 4 meq/L (ref 3.5–5.1)
SODIUM: 143 meq/L (ref 136–145)
Total Protein: 6.9 g/dL (ref 6.4–8.3)

## 2014-12-17 LAB — CBC WITH DIFFERENTIAL/PLATELET
BASO%: 0.7 % (ref 0.0–2.0)
BASOS ABS: 0 10*3/uL (ref 0.0–0.1)
EOS ABS: 0.3 10*3/uL (ref 0.0–0.5)
EOS%: 4.5 % (ref 0.0–7.0)
HEMATOCRIT: 41.6 % (ref 34.8–46.6)
HEMOGLOBIN: 14 g/dL (ref 11.6–15.9)
LYMPH%: 16.6 % (ref 14.0–49.7)
MCH: 33.9 pg (ref 25.1–34.0)
MCHC: 33.6 g/dL (ref 31.5–36.0)
MCV: 100.8 fL (ref 79.5–101.0)
MONO#: 0.5 10*3/uL (ref 0.1–0.9)
MONO%: 9.7 % (ref 0.0–14.0)
NEUT%: 68.5 % (ref 38.4–76.8)
NEUTROS ABS: 3.9 10*3/uL (ref 1.5–6.5)
Platelets: 185 10*3/uL (ref 145–400)
RBC: 4.13 10*6/uL (ref 3.70–5.45)
RDW: 13.5 % (ref 11.2–14.5)
WBC: 5.7 10*3/uL (ref 3.9–10.3)
lymph#: 0.9 10*3/uL (ref 0.9–3.3)

## 2014-12-17 NOTE — Telephone Encounter (Signed)
inbox to gretchen for 01/2016 survivorship along with note for dr Jana Hakim 2018

## 2014-12-17 NOTE — Progress Notes (Signed)
ID: [name]   DOB: May 11, 1941  MR#: [id number]  CSN#:[id number]  PCP: Abigail Noon, MD GYN: SU:  OTHER MD:   HISTORY OF PRESENT ILLNESS: From the original intake note:  [name] had mammography early in 1988, showing a 5 mm mass in her left breast. I do not have details of further workup of this but in March of 1988 she underwent left modified radical mastectomy for what proved to be a multifocal microscopic invasive ductal breast cancer. There was not enough tissue for estrogen receptor determination. She was treated adjuvantly with CMF x6, which she tolerated well.  Subsequently she underwent reconstruction involving a latissimus flap, which unfortunately necrosed. She later had an implant placed. She had a right reduction mammoplasty for symmetry.   Her subsequent history is as detailed below.  INTERVAL HISTORY: [name] returns today for follow-up of her remote early stage breast cancer. Her history, review of systems and physical today did not give any evidence of recurrence.   REVIEW OF SYSTEMS: [name] is having asthma problems. She just had cataract removed. She is worried about her weight. She has not been able to exercise for several months because of some arthritis issues. She bruises easily. She feels anxious but not depressed. A detailed review of systems today was otherwise noncontributory   PAST MEDICAL HISTORY: Past Medical History  Diagnosis Date  . Breast cancer   . Hypertension   . Asthma   . Hypothyroidism   . Sleep apnea   . Collagenous colitis   . Osteopenia   . Osteoarthritis   . Anxiety     PAST SURGICAL HISTORY: Past Surgical History  Procedure Laterality Date  . Modified radical mastectomy w/ axillary lymph node dissection      Left  . Breast reduction surgery      Right  . Toe surgery    . Septoplasty    . Cesarean section    . Tonsillectomy and adenoidectomy    . Right foot surgery      corrected hammer toe, straightened big toe  .  Reconstruction breast w/ latissimus dorsi flap      left    FAMILY HISTORY The patient's father died at the age of 15. Her mother died at the age of 1. The patient has one full brother, one half-brother, and one half-sister. The patient is known to be BRCA1 and 2 negative by large rearrangement or specific mutation tests.  GYNECOLOGIC HISTORY: (Reviewed 12/28/2013) Menarche age 66. First live birth age 55. The patient is GX P1. She underwent early menopause at the age of 83 and took estrogen replacement for several years, switched to progesterone cream when she was diagnosed with breast cancer.   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 She is very active at writing plays and with the theater. Her husband [name] is a former of [employer] man, now retired. Son [name] works in Risk analyst. Daughter [name] in [place], [place] and works as a Wellsite geologist of a [employer]. [name] had her first child [name]") in May 2015. She is not a Ambulance person.    ADVANCED DIRECTIVES: not in place   HEALTH MAINTENANCE: (Updated 12/28/2013) History  Substance Use Topics  . Smoking status: Never Smoker   . Smokeless tobacco: Not on file  . Alcohol Use: No     Colonoscopy: 2012   PAP: 2012  Bone density: 12/11/2013, osteopenia, T score -1.3  Lipid panel: Not on file/Dr. Melford Aase  Allergies  Allergen Reactions  . Adhesive [Tape]   . Bactrim [Sulfamethoxazole-Trimethoprim]   . Sulfa Antibiotics     Current Outpatient Prescriptions  Medication Sig Dispense Refill  . albuterol (PROAIR HFA) 108 (90 BASE) MCG/ACT inhaler Inhale 2 puffs into the lungs every 6 (six) hours as needed for wheezing or shortness of breath. 3 each 0  . bismuth subsalicylate (PEPTO BISMOL) 262 MG/15ML suspension Take 15 mLs by mouth every 6 (six) hours as needed.    . Calcium Citrate-Vitamin D (CITRACAL + D PO) Take 2 tablets by mouth  daily.    . Cholecalciferol (VITAMIN D-3) 1000 UNITS CAPS Take 2 capsules by mouth daily.    Marland Kitchen dextromethorphan-guaiFENesin (MUCINEX DM) 30-600 MG per 12 hr tablet Take 1 tablet by mouth every 12 (twelve) hours.    Marland Kitchen diltiazem (CARDIZEM CD) 240 MG 24 hr capsule Take 240 mg by mouth daily.    . diphenhydrAMINE (BENADRYL) 25 MG tablet Take 50 mg by mouth 4 (four) times daily.    . Flax OIL Take 1,000 mg by mouth 3 (three) times daily.     . fluticasone (FLOVENT HFA) 110 MCG/ACT inhaler USE 2 INHALATIONS INTO THE LUNGS DAILY 3 Inhaler 1  . furosemide (LASIX) 40 MG tablet Take 60 mg by mouth daily.     . Glucos-Chon-MSM-Ca-C-CtCl-SeCu TABS Take 1 tablet by mouth.    . levothyroxine (SYNTHROID, LEVOTHROID) 88 MCG tablet Take 88 mcg by mouth daily.    Marland Kitchen losartan (COZAAR) 100 MG tablet Take 100 mg by mouth daily.    . Multiple Vitamins-Minerals (MULTIVITAMIN WITH MINERALS) tablet Take 1 tablet by mouth daily.    . NON FORMULARY Mega b stress once a day    . Olopatadine HCl (PATANASE) 0.6 % SOLN Place 2 drops (2 puffs total) into the nose as needed. 3 Bottle 3  . Potassium Bicarbonate 99 MG CAPS Take 4 capsules by mouth 3 (three) times daily.    . sertraline (ZOLOFT) 100 MG tablet Take 150 mg by mouth daily.     Marland Kitchen zolpidem (AMBIEN) 10 MG tablet Take 10 mg by mouth at bedtime as needed.     No current facility-administered medications for this visit.    OBJECTIVE: Older white woman in no acute distress Filed Vitals:   12/17/14 1453  BP: 135/66  Pulse: 63  Temp: 98.1 F (36.7 C)  Resp: 18     Body mass index is 48.48 kg/(m^2).    ECOG FS: 1 Filed Weights   12/17/14 1453  Weight: 273 lb 9.6 oz (124.104 kg)   Sclerae unicteric, pupils round and equal Oropharynx clear and moist-- no thrush or other lesions No cervical or supraclavicular adenopathy Lungs no rales or rhonchi Heart regular rate and rhythm Abd soft, nontender, positive bowel sounds MSK no focal spinal tenderness, no upper  extremity lymphedema Neuro: nonfocal, well oriented, appropriate affect Breasts: The right breast is status post reduction mammoplasty. The left breast is status post mastectomy with TRAM reconstruction. There is no evidence of local recurrence. The right axilla is benign.    LAB RESULTS: Lab Results  Component Value Date   WBC 5.7 12/17/2014   NEUTROABS 3.9 12/17/2014   HGB 14.0 12/17/2014   HCT 41.6 12/17/2014   MCV 100.8 12/17/2014   PLT 185 12/17/2014      Chemistry      Component Value Date/Time   NA 143 12/17/2014 1435   NA 141 12/16/2011 1617      Component Value Date/Time  CALCIUM 9.8 12/17/2014 1435   CALCIUM 10.1 12/16/2011 1617       STUDIES: No results found.   ASSESSMENT: 74 y.o. [place] woman   (1)  s/p Left modified radical mastectomy March 1988 for a pT1a pN0, stage IA microinvasive ductal breast cancer, estrogen receptor unknown,   (2)  treated adjuvantly with cyclophosphamide/ methotrexate/ fluorouracil x6, without evidence of recurrence to date  (3)  currently followed with observation alone on an annual basis per patient request  PLAN: Alexy is doing well from a breast cancer point of view. She has problems with asthma, obesity, and hypothyroidism, which are being addressed through her primary care physician.  We discussed our new "survivorship" program. She is interested in "giving it a try". She will see our survivorship nurse practitioner in 1 year. She can then see me 2 years from now or continue with a survivorship nurse practitioner at her discretion.  Nyasia knows to call for any problems that may develop before her next visit here.  Chauncey Cruel, MD      12/17/2014

## 2014-12-23 ENCOUNTER — Other Ambulatory Visit: Payer: Self-pay | Admitting: Pulmonary Disease

## 2014-12-24 ENCOUNTER — Ambulatory Visit: Payer: Medicare Other

## 2014-12-30 ENCOUNTER — Ambulatory Visit
Admission: RE | Admit: 2014-12-30 | Discharge: 2014-12-30 | Disposition: A | Discharge status: Home / Self Care | Payer: Medicare Other | Source: Ambulatory Visit

## 2014-12-30 DIAGNOSIS — Z1231 Encounter for screening mammogram for malignant neoplasm of breast: Secondary | ICD-10-CM

## 2015-01-16 ENCOUNTER — Other Ambulatory Visit: Payer: Self-pay | Admitting: Pulmonary Disease

## 2015-02-17 ENCOUNTER — Encounter: Payer: Self-pay | Admitting: Pulmonary Disease

## 2015-02-17 ENCOUNTER — Ambulatory Visit (INDEPENDENT_AMBULATORY_CARE_PROVIDER_SITE_OTHER): Payer: Medicare Other | Admitting: Pulmonary Disease

## 2015-02-17 VITALS — BP 112/80 | HR 66 | Ht 63.0 in | Wt 269.0 lb

## 2015-02-17 DIAGNOSIS — G4733 Obstructive sleep apnea (adult) (pediatric): Secondary | ICD-10-CM

## 2015-02-17 DIAGNOSIS — J4530 Mild persistent asthma, uncomplicated: Secondary | ICD-10-CM | POA: Diagnosis not present

## 2015-02-17 DIAGNOSIS — Z9989 Dependence on other enabling machines and devices: Principal | ICD-10-CM

## 2015-02-17 NOTE — Patient Instructions (Signed)
Can try pre-treating with Proair before going outside in hot weather Will call with results of CPAP download Follow up in 1 year

## 2015-02-17 NOTE — Progress Notes (Signed)
Chief Complaint  Patient presents with  . Follow-up    Uses CPAP nightly. Denies any problems with mask/pressure.    History of Present Illness: [name] is a 74 y.o. 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  She has been doing well with CPAP.  She uses full face mask.  She uses moleskin to help with mask fit.  She gets about 10 hrs sleep per night.  She uses flovent two puffs once per day.  She does okay except in hot weather.  She otherwise does not have cough, wheeze, or chest congestion.   TESTS: PSG 12/01/06>>AHI 10, SpO2 low 80%, PLMI 0  CPAP titration 02/16/07>>CPAP 11 cm H2O  CPAP 04/09/12 to 05/08/12>>Used on 30 of 30 nights with average 10 hrs 54 min. Average AHI 1.6 with CPAP 11 cm H2O.  Spirometry 06/06/12>>FEV1 1.48 (69%), FVC 1.95 (70%), FEV1% 76 PFT 07/27/12 >> FEV1 1.71 (88%), FEV1% 77, FEF 25-75% 1.44 (64%), TLC 3.58 (77%), DLCO 72%, no BD. CPAP 12/17/13 to 01/15/14 >> used on 30 of 03 nights with average 10 hrs and 17 min. Average AHI is 1.8 with CPAP 11 cm H2O.   PMHx >> Breast cancer, HTN, Hypothyroidism, Osteoporosis, Anxiety, Collagenous colitis  PSHx, Medications, Allergies, Fhx, Shx reviewed.  Physical Exam: BP 112/80 mmHg  Pulse 66  Ht 5\' 3"  (1.6 m)  Wt 269 lb (122.018 kg)  BMI 47.66 kg/m2  SpO2 95%  General - No distress ENT - No sinus tenderness, no oral exudate, no LAN, MP 2 Cardiac - s1s2 regular, no murmur Chest - No wheeze/rales/dullness Back - No focal tenderness Abd - Soft, non-tender Ext - No edema Neuro - Normal strength Skin - No rashes Psych - normal mood, and behavior   Assessment/Plan:  Obstructive sleep apnea. Plan: - will get copy of her CPAP download and call her with results - continue CPAP 11 cm H2O  Asthma. Plan: - continue flovent two puffs daily - advised she could try pre-treating with proair before going outside   Chesley Mires, MD [facility] Pager:   [phone]

## 2015-03-23 ENCOUNTER — Telehealth: Payer: Self-pay | Admitting: Pulmonary Disease

## 2015-03-23 NOTE — Telephone Encounter (Signed)
CPAP 01/26/15 to 02/24/15 >> used on 30 of 30 nights with average 10 hrs and 27 min.  Average AHI is 1.4 with CPAP 11 cm H2O.   Will have my nurse inform pt that CPAP report shows good control of sleep apnea.  No change to current set up needed.

## 2015-04-02 NOTE — Telephone Encounter (Signed)
lmtcb

## 2015-04-02 NOTE — Telephone Encounter (Signed)
Pt is aware of CPAP download results. Nothing further was needed. 

## 2015-04-02 NOTE — Telephone Encounter (Signed)
Returning cal for results [phone]

## 2015-07-14 ENCOUNTER — Other Ambulatory Visit: Payer: Self-pay | Admitting: Pulmonary Disease

## 2015-07-17 ENCOUNTER — Telehealth: Payer: Self-pay | Admitting: Pulmonary Disease

## 2015-07-17 MED ORDER — OLOPATADINE HCL 0.6 % NA SOLN: 2.0000 | NASAL | Status: DC | PRN

## 2015-07-17 NOTE — Telephone Encounter (Signed)
Called spoke with pt. She needs [facility] i have sent this in. Nothing further needed

## 2015-09-03 ENCOUNTER — Other Ambulatory Visit: Payer: Self-pay | Admitting: Pulmonary Disease

## 2015-09-03 MED ORDER — AZELASTINE HCL 0.1 % NA SOLN: 2.0000 | Freq: Two times a day (BID) | NASAL | Status: DC

## 2015-09-15 ENCOUNTER — Encounter: Payer: Self-pay | Admitting: Internal Medicine

## 2015-09-15 ENCOUNTER — Ambulatory Visit (INDEPENDENT_AMBULATORY_CARE_PROVIDER_SITE_OTHER): Payer: Medicare Other | Admitting: Internal Medicine

## 2015-09-15 ENCOUNTER — Ambulatory Visit (INDEPENDENT_AMBULATORY_CARE_PROVIDER_SITE_OTHER)
Admission: RE | Admit: 2015-09-15 | Discharge: 2015-09-15 | Disposition: A | Discharge status: Home / Self Care | Payer: Medicare Other | Source: Ambulatory Visit | Attending: Internal Medicine | Admitting: Internal Medicine

## 2015-09-15 VITALS — BP 126/82 | HR 70 | Temp 98.0°F | Ht 63.0 in | Wt 265.8 lb

## 2015-09-15 DIAGNOSIS — J4521 Mild intermittent asthma with (acute) exacerbation: Secondary | ICD-10-CM

## 2015-09-15 DIAGNOSIS — J45901 Unspecified asthma with (acute) exacerbation: Secondary | ICD-10-CM | POA: Insufficient documentation

## 2015-09-15 MED ORDER — ALBUTEROL SULFATE (2.5 MG/3ML) 0.083% IN NEBU
2.5000 mg | INHALATION_SOLUTION | Freq: Once | RESPIRATORY_TRACT | Status: AC
Start: 2015-09-15 — End: 2015-09-15
  Administered 2015-09-15: 2.5 mg via RESPIRATORY_TRACT

## 2015-09-15 MED ORDER — PREDNISONE 10 MG PO TABS: ORAL_TABLET | ORAL | Status: DC

## 2015-09-15 NOTE — Patient Instructions (Addendum)
Plan A = Automatic = dulera 100 Take 2 puffs first thing in am and then another 2 puffs about 12 hours later until done with it then back to flovent afterwards  Work on inhaler technique:  relax and gently blow all the way out then take a nice smooth deep breath back in, triggering the inhaler at same time you start breathing in.  Hold for up to 5 seconds if you can. Blow out thru nose. Rinse and gargle with water when done   Plan B = Backup Only use your albuterol as a rescue medication to be used if you can't catch your breath by resting or doing a relaxed purse lip breathing pattern.  - The less you use it, the better it will work when you need it. - Ok to use up to 2 puffs  every 4 hours if you must but call for appointment if use goes up over your usual need - Don't leave home without it !!  (think of it like the spare tire for your car)     Prednisone 10 mg take  4 each am x 2 days,   2 each am x 2 days,  1 each am x 2 days and stop   Please remember to go to the xray department downstairs for your tests - we will call you with the results when they are available.

## 2015-09-15 NOTE — Progress Notes (Signed)
Quick Note:  Spoke with pt and notified of results per Dr. Wert. Pt verbalized understanding and denied any questions.  ______ 

## 2015-09-15 NOTE — Progress Notes (Addendum)
Subjective:     Patient ID: [name], female   DOB: 10-Apr-1941,   MRN: [id number]  HPI  65 yowf with chemo 1988 then developed asthma maint on Flovent hfa 2 puffs each pm with no recent flare   09/16/2015  Acute extened  Pulmonary office visit/ Isay Perleberg  Re asthma exac, severe Chief Complaint  Patient presents with  . Acute Visit    Pt c/o cough- prod with yellow sputum x 5 days. She also c/o increased SOB, wheezing and chest tightness- using albuterol 3 x daily on average which is rare for her.     then acutely ill starting 09/07/15 with new cough > yellow mucus sensation pnds > eval by Roe Coombs 09/11/15  Zpak/ mucinex dm  Worst day was 09/14/15 some better on day of ov. Normally does not need saba now up to 3 x daily and also at hs, comfortable for hours at rest p saba   No obvious day to day or daytime variability or assoc   cp  or overt sinus or hb symptoms. No unusual exp hx or h/o childhood pna/ asthma or knowledge of premature birth.   Also denies any obvious fluctuation of symptoms with weather or environmental changes or other aggravating or alleviating factors except as outlined above   Current Medications, Allergies, Complete Past Medical History, Past Surgical History, Family History, and Social History were reviewed in [facility] record.  ROS  The following are not active complaints unless bolded sore throat, dysphagia, dental problems, itching, sneezing,  nasal congestion or excess/ purulent secretions, ear ache,   fever, chills, sweats, unintended wt loss, classically pleuritic or exertional cp, hemoptysis,  orthopnea pnd or leg swelling, presyncope, palpitations, abdominal pain, anorexia, nausea, vomiting, diarrhea  or change in bowel or bladder habits, change in stools or urine, dysuria,hematuria,  rash, arthralgias, visual complaints, headache, numbness, weakness or ataxia or problems with walking or coordination,  change in mood/affect or memory.        Review of Systems     Objective:   Physical Exam    amb obese wf nad  Wt Readings from Last 3 Encounters:  09/15/15 265 lb 12.8 oz (120.566 kg)  02/17/15 269 lb (122.018 kg)  12/17/14 273 lb 9.6 oz (124.104 kg)    Vital signs reviewed  HEENT: nl dentition, turbinates, and oropharynx. Nl external ear canals without cough reflex   NECK :  without JVD/Nodes/TM/ nl carotid upstrokes bilaterally   LUNGS: no acc muscle use,  insp and exp sonorous rhonchi bilaterally, relatively tight    CV:  RRR  no s3 or murmur or increase in P2, no edema   ABD:  soft and nontender with nl inspiratory excursion in the supine position. No bruits or organomegaly, bowel sounds nl  MS:  Nl gait/ ext warm without deformities, calf tenderness, cyanosis or clubbing No obvious joint restrictions   SKIN: warm and dry without lesions    NEURO:  alert, approp, nl sensorium with  no motor deficits    CXR PA and Lateral:   09/15/2015 :    I personally reviewed images and agree with radiology impression as follows:    No active cardiopulmonary disease. Assessment:

## 2015-09-16 ENCOUNTER — Encounter: Payer: Self-pay | Admitting: Internal Medicine

## 2015-09-16 MED ORDER — MOMETASONE FURO-FORMOTEROL FUM 100-5 MCG/ACT IN AERO: INHALATION_SPRAY | RESPIRATORY_TRACT | Status: DC

## 2015-09-16 NOTE — Assessment & Plan Note (Signed)
Body mass index is 47.1  No results found for: TSH   Contributing to gerd tendency/ doe/reviewed the need and the process to achieve and maintain neg calorie balance > defer f/u primary care including intermittently monitoring thyroid status

## 2015-09-16 NOTE — Assessment & Plan Note (Signed)
Acute flare/ breakthru flovent 100 in pt with well controlled asthma historically, in pt with apparent viral uri s pna on cxr  - The proper method of use, as well as anticipated side effects, of a metered-dose inhaler are discussed and demonstrated to the patient. Improved effectiveness after extensive coaching during this visit to a level of approximately 75 % from a baseline of 50 % > try dulera 100 2bid (the lower dose due to severe cough which can be aggravated by higher doses ) and rx with 6 days pred with f/u by Dr Halford Chessman  I had an extended discussion with the patient reviewing all relevant studies completed to date and  lasting 25 minutes of a40 minute extended acute office visit    Each maintenance medication was reviewed in detail including most importantly the difference between maintenance and prns and under what circumstances the prns are to be triggered using an action plan format that is not reflected in the computer generated alphabetically organized AVS.    Please see instructions for details which were reviewed in writing and the patient given a copy highlighting the part that I personally wrote and discussed at today's ov.

## 2015-09-24 ENCOUNTER — Ambulatory Visit
Admission: RE | Admit: 2015-09-24 | Discharge: 2015-09-24 | Disposition: A | Discharge status: Home / Self Care | Payer: Medicare Other | Source: Ambulatory Visit | Attending: Endocrinology | Admitting: Endocrinology

## 2015-09-24 ENCOUNTER — Other Ambulatory Visit: Payer: Medicare Other

## 2015-09-24 DIAGNOSIS — E049 Nontoxic goiter, unspecified: Secondary | ICD-10-CM

## 2016-02-05 ENCOUNTER — Other Ambulatory Visit: Payer: Self-pay | Admitting: Orthopedic Surgery

## 2016-02-05 DIAGNOSIS — M545 Low back pain: Secondary | ICD-10-CM

## 2016-02-10 ENCOUNTER — Ambulatory Visit
Admission: RE | Admit: 2016-02-10 | Discharge: 2016-02-10 | Disposition: A | Discharge status: Home / Self Care | Payer: Medicare Other | Source: Ambulatory Visit | Attending: Orthopedic Surgery | Admitting: Orthopedic Surgery

## 2016-02-10 ENCOUNTER — Other Ambulatory Visit: Payer: Medicare Other

## 2016-02-10 DIAGNOSIS — M545 Low back pain: Secondary | ICD-10-CM

## 2016-02-14 ENCOUNTER — Other Ambulatory Visit: Payer: Medicare Other

## 2016-02-17 ENCOUNTER — Encounter (HOSPITAL_COMMUNITY): Payer: Self-pay | Admitting: *Deleted

## 2016-02-17 ENCOUNTER — Other Ambulatory Visit: Payer: Self-pay | Admitting: Orthopedic Surgery

## 2016-02-17 MED ORDER — DEXTROSE 5 % IV SOLN
3.0000 g | INTRAVENOUS | Status: AC
  Administered 2016-02-18: 3 g via INTRAVENOUS
  Filled 2016-02-17: qty 3000

## 2016-02-17 NOTE — Progress Notes (Signed)
Pt denies SOB, chest pain, and being under the care of a cardiologist. Pt denies having a stress test, echo and cardiac cath. Pt denies having an EKG within the last year and any recent lab work.  Pt made aware to stop taking Aspirin, vitamins, fish oil and herbal medications such as Mega B stress. Do not take any NSAIDs ie: Ibuprofen, Advil, Naproxen, BC and Goody Powder or any medication containing Aspirin. Pt verbalized understanding of all pre-op instructions.

## 2016-02-17 NOTE — H&P (Signed)
PREOPERATIVE H&P  Chief Complaint: back pain  HPI: [name] is a 75 y.o. female who presents with ongoing pain in the upper low back  MRI reveals a compression fracture at L1  Patient has failed multiple forms of conservative care and continues to have pain (see office notes for additional details regarding the patient's full course of treatment)  Past Medical History:  Diagnosis Date  . Anemia    PMH  . Anxiety   . Asthma   . Breast cancer ([place])   . Collagenous colitis   . Compression fracture    lumbar 1  . Hypertension   . Hypothyroidism   . Osteoarthritis   . Osteopenia   . PONV (postoperative nausea and vomiting)   . Sleep apnea    Past Surgical History:  Procedure Laterality Date  . BREAST REDUCTION SURGERY     Right  . CATARACT EXTRACTION W/ INTRAOCULAR LENS  IMPLANT, BILATERAL    . CESAREAN SECTION    . COLONOSCOPY W/ BIOPSIES AND POLYPECTOMY    . MODIFIED RADICAL MASTECTOMY W/ AXILLARY LYMPH NODE DISSECTION     Left  . RECONSTRUCTION BREAST W/ LATISSIMUS DORSI FLAP     left  . right foot surgery     corrected hammer toe, straightened big toe  . SEPTOPLASTY    . TOE SURGERY    . TONSILLECTOMY AND ADENOIDECTOMY     Social History   Social History  . Marital status: Married    Spouse name: N/A  . Number of children: N/A  . Years of education: N/A   Occupational History  . retired    Social History Main Topics  . Smoking status: Never Smoker  . Smokeless tobacco: Never Used  . Alcohol use No  . Drug use: No  . Sexual activity: Not Asked   Other Topics Concern  . None   Social History Narrative  . None   Family History  Problem Relation Age of Onset  . Lung cancer Mother   . Hypertension Mother   . Ovarian cancer Sister   . Breast cancer Maternal Aunt   . Brain cancer Maternal Aunt   . Breast cancer      cousin  . Ovarian cancer      cousin  . Kidney cancer      cousin  . Kidney cancer Paternal Uncle   . Prostate  cancer Paternal Uncle   . Hypertension Father   . Hypertension Brother    Allergies  Allergen Reactions  . Adhesive [Tape] Other (See Comments)    UNSPECIFIED   . Bactrim [Sulfamethoxazole-Trimethoprim] Other (See Comments)    UNSPECIFIED   . Dairy Aid [Lactase] Other (See Comments)    Ice cream, sour cream  . Sulfa Antibiotics Other (See Comments)    UNSPECIFIED    Prior to Admission medications   Medication Sig Start Date End Date Taking? Authorizing Provider  albuterol (PROAIR HFA) 108 (90 BASE) MCG/ACT inhaler Inhale 2 puffs into the lungs every 6 (six) hours as needed for wheezing or shortness of breath. 11/04/14  Yes Chesley Mires, MD  azelastine (ASTELIN) 0.1 % nasal spray Place 1 spray into both nostrils daily as needed for allergies. 01/17/16  Yes Historical Provider, MD  Calcium Citrate-Vitamin D (CITRACAL + D PO) Take 2 tablets by mouth daily.   Yes Historical Provider, MD  clobetasol cream (TEMOVATE) AB-123456789 % Apply 1 application topically daily as needed (irritation).   Yes Historical Provider, MD  dextromethorphan-guaiFENesin (  MUCINEX DM) 30-600 MG per 12 hr tablet Take 1 tablet by mouth every 12 (twelve) hours.   Yes Historical Provider, MD  diltiazem (CARDIZEM CD) 240 MG 24 hr capsule Take 240 mg by mouth daily.   Yes Historical Provider, MD  diphenhydrAMINE (BENADRYL) 25 MG tablet Take 50 mg by mouth 4 (four) times daily.   Yes Historical Provider, MD  FLOVENT HFA 110 MCG/ACT inhaler USE 2 INHALATIONS DAILY 07/14/15  Yes Chesley Mires, MD  furosemide (LASIX) 20 MG tablet Take 60 mg by mouth daily.    Yes Historical Provider, MD  ketotifen (ZADITOR) 0.025 % ophthalmic solution Place 1 drop into both eyes daily as needed for dry eyes.   Yes Historical Provider, MD  levothyroxine (SYNTHROID, LEVOTHROID) 88 MCG tablet Take 88 mcg by mouth daily.   Yes Historical Provider, MD  losartan (COZAAR) 100 MG tablet Take 100 mg by mouth daily.   Yes Historical Provider, MD  Multiple  Vitamins-Minerals (MULTIVITAMIN WITH MINERALS) tablet Take 1 tablet by mouth daily.   Yes Historical Provider, MD  mupirocin ointment (BACTROBAN) 2 % Apply 1 application topically daily as needed for pain. 01/18/16  Yes Historical Provider, MD  NON FORMULARY Mega b stress once a day   Yes Historical Provider, MD  oxyCODONE-acetaminophen (PERCOCET/ROXICET) 5-325 MG tablet Take 1 tablet by mouth every 6 (six) hours as needed for pain. 02/12/16  Yes Historical Provider, MD  Potassium Bicarbonate 99 MG CAPS Take 4 capsules by mouth 3 (three) times daily.   Yes Historical Provider, MD  sertraline (ZOLOFT) 100 MG tablet Take 100 mg by mouth 2 (two) times daily.    Yes Historical Provider, MD  tiZANidine (ZANAFLEX) 2 MG tablet Take 2 mg by mouth every 6 (six) hours as needed for pain. 02/05/16  Yes Historical Provider, MD  zolpidem (AMBIEN) 10 MG tablet Take 10 mg by mouth at bedtime as needed for sleep.    Yes Historical Provider, MD  mometasone-formoterol (DULERA) 100-5 MCG/ACT AERO Take 2 puffs first thing in am and then another 2 puffs about 12 hours later. Patient not taking: Reported on 02/13/2016 09/16/15   Tanda Rockers, MD  predniSONE (DELTASONE) 10 MG tablet Take  4 each am x 2 days,   2 each am x 2 days,  1 each am x 2 days and stop Patient not taking: Reported on 02/13/2016 09/15/15   Tanda Rockers, MD     All other systems have been reviewed and were otherwise negative with the exception of those mentioned in the HPI and as above.  Physical Exam: There were no vitals filed for this visit.  General: Alert, no acute distress Cardiovascular: No pedal edema Respiratory: No cyanosis, no use of accessory musculature Skin: No lesions in the area of chief complaint Neurologic: Sensation intact distally Psychiatric: Patient is competent for consent with normal mood and affect Lymphatic: No axillary or cervical lymphadenopathy  MUSCULOSKELETAL: + TTP in upper lumbar  region  Assessment/Plan: Lumbar 1 compression fracture  Plan for Procedure(s): LUMBAR 1 KYPHOPLASTY   Sinclair Ship, MD 02/17/2016 9:51 PM

## 2016-02-18 ENCOUNTER — Ambulatory Visit (HOSPITAL_COMMUNITY): Payer: Medicare Other | Admitting: Certified Registered Nurse Anesthetist

## 2016-02-18 ENCOUNTER — Encounter (HOSPITAL_COMMUNITY)
Admission: RE | Disposition: A | Discharge status: Home / Self Care | Payer: Self-pay | Source: Ambulatory Visit | Attending: Orthopedic Surgery

## 2016-02-18 ENCOUNTER — Ambulatory Visit (HOSPITAL_COMMUNITY)
Admission: RE | Admit: 2016-02-18 | Discharge: 2016-02-18 | Disposition: A | Discharge status: Home / Self Care | Payer: Medicare Other | Source: Ambulatory Visit | Attending: Orthopedic Surgery | Admitting: Orthopedic Surgery

## 2016-02-18 ENCOUNTER — Ambulatory Visit (HOSPITAL_COMMUNITY): Payer: Medicare Other

## 2016-02-18 ENCOUNTER — Encounter (HOSPITAL_COMMUNITY): Payer: Self-pay | Admitting: *Deleted

## 2016-02-18 DIAGNOSIS — Z9012 Acquired absence of left breast and nipple: Secondary | ICD-10-CM | POA: Insufficient documentation

## 2016-02-18 DIAGNOSIS — S32019A Unspecified fracture of first lumbar vertebra, initial encounter for closed fracture: Secondary | ICD-10-CM | POA: Diagnosis not present

## 2016-02-18 DIAGNOSIS — Z419 Encounter for procedure for purposes other than remedying health state, unspecified: Secondary | ICD-10-CM

## 2016-02-18 DIAGNOSIS — E039 Hypothyroidism, unspecified: Secondary | ICD-10-CM | POA: Diagnosis not present

## 2016-02-18 DIAGNOSIS — J45909 Unspecified asthma, uncomplicated: Secondary | ICD-10-CM | POA: Insufficient documentation

## 2016-02-18 DIAGNOSIS — G4730 Sleep apnea, unspecified: Secondary | ICD-10-CM | POA: Insufficient documentation

## 2016-02-18 DIAGNOSIS — Z79899 Other long term (current) drug therapy: Secondary | ICD-10-CM | POA: Insufficient documentation

## 2016-02-18 DIAGNOSIS — Z853 Personal history of malignant neoplasm of breast: Secondary | ICD-10-CM | POA: Insufficient documentation

## 2016-02-18 DIAGNOSIS — M8580 Other specified disorders of bone density and structure, unspecified site: Secondary | ICD-10-CM | POA: Insufficient documentation

## 2016-02-18 DIAGNOSIS — W19XXXA Unspecified fall, initial encounter: Secondary | ICD-10-CM | POA: Insufficient documentation

## 2016-02-18 DIAGNOSIS — I10 Essential (primary) hypertension: Secondary | ICD-10-CM | POA: Diagnosis not present

## 2016-02-18 DIAGNOSIS — Z6841 Body Mass Index (BMI) 40.0 and over, adult: Secondary | ICD-10-CM | POA: Diagnosis not present

## 2016-02-18 DIAGNOSIS — M4856XA Collapsed vertebra, not elsewhere classified, lumbar region, initial encounter for fracture: Secondary | ICD-10-CM | POA: Diagnosis present

## 2016-02-18 DIAGNOSIS — Z01818 Encounter for other preprocedural examination: Secondary | ICD-10-CM

## 2016-02-18 HISTORY — DX: Reserved for concepts with insufficient information to code with codable children: IMO0002

## 2016-02-18 HISTORY — DX: Other specified postprocedural states: Z98.890

## 2016-02-18 HISTORY — DX: Nausea with vomiting, unspecified: R11.2

## 2016-02-18 HISTORY — PX: KYPHOPLASTY: SHX5884

## 2016-02-18 HISTORY — DX: Anemia, unspecified: D64.9

## 2016-02-18 LAB — CBC WITH DIFFERENTIAL/PLATELET
Basophils Absolute: 0 10*3/uL (ref 0.0–0.1)
Basophils Relative: 1 %
EOS ABS: 0.4 10*3/uL (ref 0.0–0.7)
EOS PCT: 7 %
HCT: 42.5 % (ref 36.0–46.0)
Hemoglobin: 13.8 g/dL (ref 12.0–15.0)
LYMPHS ABS: 1.1 10*3/uL (ref 0.7–4.0)
LYMPHS PCT: 18 %
MCH: 33.7 pg (ref 26.0–34.0)
MCHC: 32.5 g/dL (ref 30.0–36.0)
MCV: 103.9 fL — AB (ref 78.0–100.0)
MONO ABS: 0.8 10*3/uL (ref 0.1–1.0)
MONOS PCT: 14 %
Neutro Abs: 3.6 10*3/uL (ref 1.7–7.7)
Neutrophils Relative %: 60 %
PLATELETS: 168 10*3/uL (ref 150–400)
RBC: 4.09 MIL/uL (ref 3.87–5.11)
RDW: 13.4 % (ref 11.5–15.5)
WBC: 5.9 10*3/uL (ref 4.0–10.5)

## 2016-02-18 LAB — URINE MICROSCOPIC-ADD ON: RBC / HPF: NONE SEEN RBC/hpf (ref 0–5)

## 2016-02-18 LAB — COMPREHENSIVE METABOLIC PANEL
ALT: 42 U/L (ref 14–54)
ANION GAP: 8 (ref 5–15)
AST: 34 U/L (ref 15–41)
Albumin: 3.6 g/dL (ref 3.5–5.0)
Alkaline Phosphatase: 101 U/L (ref 38–126)
BUN: 9 mg/dL (ref 6–20)
CALCIUM: 9.7 mg/dL (ref 8.9–10.3)
CHLORIDE: 107 mmol/L (ref 101–111)
CO2: 24 mmol/L (ref 22–32)
CREATININE: 0.63 mg/dL (ref 0.44–1.00)
Glucose, Bld: 103 mg/dL — ABNORMAL HIGH (ref 65–99)
Potassium: 3.3 mmol/L — ABNORMAL LOW (ref 3.5–5.1)
SODIUM: 139 mmol/L (ref 135–145)
Total Bilirubin: 0.7 mg/dL (ref 0.3–1.2)
Total Protein: 6.6 g/dL (ref 6.5–8.1)

## 2016-02-18 LAB — URINALYSIS, ROUTINE W REFLEX MICROSCOPIC
Bilirubin Urine: NEGATIVE
GLUCOSE, UA: NEGATIVE mg/dL
HGB URINE DIPSTICK: NEGATIVE
KETONES UR: NEGATIVE mg/dL
Nitrite: NEGATIVE
PROTEIN: NEGATIVE mg/dL
Specific Gravity, Urine: 1.013 (ref 1.005–1.030)
pH: 8 (ref 5.0–8.0)

## 2016-02-18 LAB — PROTIME-INR
INR: 1.04
PROTHROMBIN TIME: 13.7 s (ref 11.4–15.2)

## 2016-02-18 LAB — SURGICAL PCR SCREEN
MRSA, PCR: NEGATIVE
STAPHYLOCOCCUS AUREUS: NEGATIVE

## 2016-02-18 LAB — APTT: aPTT: 38 seconds — ABNORMAL HIGH (ref 24–36)

## 2016-02-18 SURGERY — KYPHOPLASTY
Anesthesia: General

## 2016-02-18 MED ORDER — FENTANYL CITRATE (PF) 100 MCG/2ML IJ SOLN
INTRAMUSCULAR | Status: DC | PRN
  Administered 2016-02-18 (×6): 50 ug via INTRAVENOUS

## 2016-02-18 MED ORDER — ONDANSETRON HCL 4 MG/2ML IJ SOLN
INTRAMUSCULAR | Status: DC | PRN
  Administered 2016-02-18: 4 mg via INTRAVENOUS

## 2016-02-18 MED ORDER — 0.9 % SODIUM CHLORIDE (POUR BTL) OPTIME
TOPICAL | Status: DC | PRN
  Administered 2016-02-18: 1000 mL

## 2016-02-18 MED ORDER — ROCURONIUM BROMIDE 100 MG/10ML IV SOLN
INTRAVENOUS | Status: DC | PRN
  Administered 2016-02-18: 10 mg via INTRAVENOUS
  Administered 2016-02-18: 40 mg via INTRAVENOUS

## 2016-02-18 MED ORDER — LIDOCAINE HCL (CARDIAC) 20 MG/ML IV SOLN
INTRAVENOUS | Status: DC | PRN
  Administered 2016-02-18: 100 mg via INTRAVENOUS

## 2016-02-18 MED ORDER — BACITRACIN 500 UNIT/GM EX OINT
TOPICAL_OINTMENT | CUTANEOUS | Status: DC | PRN
  Administered 2016-02-18: 1 via TOPICAL

## 2016-02-18 MED ORDER — OXYCODONE HCL 5 MG PO TABS
ORAL_TABLET | ORAL | Status: AC
  Filled 2016-02-18: qty 1

## 2016-02-18 MED ORDER — BUPIVACAINE-EPINEPHRINE (PF) 0.25% -1:200000 IJ SOLN
INTRAMUSCULAR | Status: AC
  Filled 2016-02-18: qty 30

## 2016-02-18 MED ORDER — SUGAMMADEX SODIUM 200 MG/2ML IV SOLN
INTRAVENOUS | Status: AC
  Filled 2016-02-18: qty 2

## 2016-02-18 MED ORDER — IOPAMIDOL (ISOVUE-300) INJECTION 61%
INTRAVENOUS | Status: DC | PRN
  Administered 2016-02-18: 50 mL

## 2016-02-18 MED ORDER — FENTANYL CITRATE (PF) 100 MCG/2ML IJ SOLN
INTRAMUSCULAR | Status: AC
  Filled 2016-02-18: qty 2

## 2016-02-18 MED ORDER — FENTANYL CITRATE (PF) 100 MCG/2ML IJ SOLN
INTRAMUSCULAR | Status: AC
Start: 2016-02-18 — End: 2016-02-18
  Filled 2016-02-18: qty 2

## 2016-02-18 MED ORDER — PHENYLEPHRINE HCL 10 MG/ML IJ SOLN
INTRAMUSCULAR | Status: DC | PRN
  Administered 2016-02-18: 120 ug via INTRAVENOUS
  Administered 2016-02-18: 80 ug via INTRAVENOUS

## 2016-02-18 MED ORDER — MUPIROCIN 2 % EX OINT
TOPICAL_OINTMENT | CUTANEOUS | Status: AC
  Filled 2016-02-18: qty 22

## 2016-02-18 MED ORDER — OXYCODONE-ACETAMINOPHEN 5-325 MG PO TABS
ORAL_TABLET | ORAL | Status: DC
Start: 2016-02-18 — End: 2016-02-19
  Filled 2016-02-18: qty 2

## 2016-02-18 MED ORDER — POVIDONE-IODINE 7.5 % EX SOLN
Freq: Once | CUTANEOUS | Status: DC
  Filled 2016-02-18: qty 118

## 2016-02-18 MED ORDER — MIDAZOLAM HCL 2 MG/2ML IJ SOLN
INTRAMUSCULAR | Status: AC
  Filled 2016-02-18: qty 2

## 2016-02-18 MED ORDER — PROPOFOL 10 MG/ML IV BOLUS
INTRAVENOUS | Status: DC | PRN
  Administered 2016-02-18: 200 mg via INTRAVENOUS
  Administered 2016-02-18: 30 mg via INTRAVENOUS

## 2016-02-18 MED ORDER — PROPOFOL 10 MG/ML IV BOLUS
INTRAVENOUS | Status: AC
  Filled 2016-02-18: qty 20

## 2016-02-18 MED ORDER — OXYCODONE-ACETAMINOPHEN 5-325 MG PO TABS
2.0000 | ORAL_TABLET | Freq: Once | ORAL | Status: AC
  Administered 2016-02-18: 2 via ORAL

## 2016-02-18 MED ORDER — DEXAMETHASONE SODIUM PHOSPHATE 10 MG/ML IJ SOLN
INTRAMUSCULAR | Status: DC | PRN
  Administered 2016-02-18: 10 mg via INTRAVENOUS

## 2016-02-18 MED ORDER — FENTANYL CITRATE (PF) 100 MCG/2ML IJ SOLN
25.0000 ug | INTRAMUSCULAR | Status: DC | PRN
  Administered 2016-02-18 (×2): 50 ug via INTRAVENOUS

## 2016-02-18 MED ORDER — PROMETHAZINE HCL 25 MG/ML IJ SOLN: 6.2500 mg | INTRAMUSCULAR | Status: DC | PRN

## 2016-02-18 MED ORDER — ONDANSETRON HCL 4 MG/2ML IJ SOLN
INTRAMUSCULAR | Status: AC
  Filled 2016-02-18: qty 2

## 2016-02-18 MED ORDER — LACTATED RINGERS IV SOLN
INTRAVENOUS | Status: DC | PRN
  Administered 2016-02-18 (×2): via INTRAVENOUS

## 2016-02-18 MED ORDER — IOPAMIDOL (ISOVUE-300) INJECTION 61%
INTRAVENOUS | Status: AC
  Filled 2016-02-18: qty 50

## 2016-02-18 MED ORDER — LIDOCAINE 2% (20 MG/ML) 5 ML SYRINGE
INTRAMUSCULAR | Status: AC
  Filled 2016-02-18: qty 5

## 2016-02-18 MED ORDER — MIDAZOLAM HCL 5 MG/5ML IJ SOLN
INTRAMUSCULAR | Status: DC | PRN
  Administered 2016-02-18: 2 mg via INTRAVENOUS

## 2016-02-18 MED ORDER — SUGAMMADEX SODIUM 200 MG/2ML IV SOLN
INTRAVENOUS | Status: DC | PRN
  Administered 2016-02-18: 250 mg via INTRAVENOUS

## 2016-02-18 MED ORDER — LACTATED RINGERS IV SOLN
INTRAVENOUS | Status: DC
  Administered 2016-02-18: 14:00:00 via INTRAVENOUS

## 2016-02-18 MED ORDER — MUPIROCIN 2 % EX OINT
1.0000 "application " | TOPICAL_OINTMENT | Freq: Once | CUTANEOUS | Status: AC
  Administered 2016-02-18: 1 via TOPICAL

## 2016-02-18 MED ORDER — BUPIVACAINE-EPINEPHRINE 0.25% -1:200000 IJ SOLN
INTRAMUSCULAR | Status: DC | PRN
  Administered 2016-02-18: 30 mL

## 2016-02-18 MED ORDER — LACTATED RINGERS IV BOLUS (SEPSIS)
500.0000 mL | Freq: Once | INTRAVENOUS | Status: AC
  Administered 2016-02-18: 500 mL via INTRAVENOUS

## 2016-02-18 MED ORDER — BACITRACIN ZINC 500 UNIT/GM EX OINT
TOPICAL_OINTMENT | CUTANEOUS | Status: AC
  Filled 2016-02-18: qty 28.35

## 2016-02-18 MED ORDER — OXYCODONE HCL 5 MG PO TABS
5.0000 mg | ORAL_TABLET | Freq: Once | ORAL | Status: AC
  Administered 2016-02-18: 5 mg via ORAL

## 2016-02-18 SURGICAL SUPPLY — 40 items
BLADE SURG 15 STRL LF DISP TIS (BLADE) ×1 IMPLANT
BLADE SURG 15 STRL SS (BLADE) ×2
COVER MAYO STAND STRL (DRAPES) ×3 IMPLANT
COVER SURGICAL LIGHT HANDLE (MISCELLANEOUS) ×3 IMPLANT
CURETTE EXPRESS SZ2 7MM (INSTRUMENTS) IMPLANT
CURRETTE EXPRESS SZ2 7MM (INSTRUMENTS)
DRAPE C-ARM 42X72 X-RAY (DRAPES) ×6 IMPLANT
DRAPE INCISE IOBAN 66X45 STRL (DRAPES) ×3 IMPLANT
DRAPE LAPAROTOMY T 102X78X121 (DRAPES) ×3 IMPLANT
DRAPE PROXIMA HALF (DRAPES) IMPLANT
DRAPE SURG 17X23 STRL (DRAPES) ×12 IMPLANT
DURAPREP 26ML APPLICATOR (WOUND CARE) ×3 IMPLANT
GAUZE SPONGE 4X4 16PLY XRAY LF (GAUZE/BANDAGES/DRESSINGS) ×3 IMPLANT
GLOVE BIO SURGEON STRL SZ7 (GLOVE) ×3 IMPLANT
GLOVE BIO SURGEON STRL SZ8 (GLOVE) ×3 IMPLANT
GLOVE BIOGEL PI IND STRL 7.0 (GLOVE) ×1 IMPLANT
GLOVE BIOGEL PI IND STRL 8 (GLOVE) ×1 IMPLANT
GLOVE BIOGEL PI INDICATOR 7.0 (GLOVE) ×2
GLOVE BIOGEL PI INDICATOR 8 (GLOVE) ×2
GOWN STRL REUS W/ TWL LRG LVL3 (GOWN DISPOSABLE) ×2 IMPLANT
GOWN STRL REUS W/ TWL XL LVL3 (GOWN DISPOSABLE) ×1 IMPLANT
GOWN STRL REUS W/TWL LRG LVL3 (GOWN DISPOSABLE) ×4
GOWN STRL REUS W/TWL XL LVL3 (GOWN DISPOSABLE) ×2
KIT BASIN OR (CUSTOM PROCEDURE TRAY) ×3 IMPLANT
KIT ROOM TURNOVER OR (KITS) ×3 IMPLANT
NEEDLE 22X1 1/2 (OR ONLY) (NEEDLE) IMPLANT
NEEDLE HYPO 25X1 1.5 SAFETY (NEEDLE) IMPLANT
NEEDLE SPNL 18GX3.5 QUINCKE PK (NEEDLE) ×6 IMPLANT
NS IRRIG 1000ML POUR BTL (IV SOLUTION) ×3 IMPLANT
PACK SURGICAL SETUP 50X90 (CUSTOM PROCEDURE TRAY) ×3 IMPLANT
PAD ARMBOARD 7.5X6 YLW CONV (MISCELLANEOUS) ×6 IMPLANT
POSITIONER HEAD PRONE TRACH (MISCELLANEOUS) ×3 IMPLANT
SPONGE GAUZE 4X4 12PLY STER LF (GAUZE/BANDAGES/DRESSINGS) ×3 IMPLANT
SUT MNCRL AB 4-0 PS2 18 (SUTURE) ×3 IMPLANT
SYR BULB IRRIGATION 50ML (SYRINGE) ×3 IMPLANT
SYR CONTROL 10ML LL (SYRINGE) ×3 IMPLANT
TAPE PAPER 3X10 WHT MICROPORE (GAUZE/BANDAGES/DRESSINGS) ×3 IMPLANT
TOWEL OR 17X24 6PK STRL BLUE (TOWEL DISPOSABLE) ×3 IMPLANT
TOWEL OR 17X26 10 PK STRL BLUE (TOWEL DISPOSABLE) ×3 IMPLANT
TRAY KYPHOPAK 15/3 ONESTEP 1ST (MISCELLANEOUS) IMPLANT

## 2016-02-18 NOTE — Transfer of Care (Signed)
Immediate Anesthesia Transfer of Care Note  Patient: [name]  Procedure(s) Performed: Procedure(s) with comments: LUMBAR 1 KYPHOPLASTY (N/A) - LUMBAR 1 KYPHOPLASTY  Patient Location: PACU  Anesthesia Type:General  Level of Consciousness: awake, alert  and patient cooperative  Airway & Oxygen Therapy: Patient Spontanous Breathing and Patient connected to face mask oxygen  Post-op Assessment: Report given to RN and Post -op Vital signs reviewed and stable  Post vital signs: Reviewed and stable  Last Vitals:  Vitals:   02/18/16 1840 02/18/16 1845  BP: (!) 90/56   Pulse: 74 71  Resp: 18 15  Temp: 36.8 C     Last Pain:  Vitals:   02/18/16 1332  TempSrc:   PainSc: 5       Patients Stated Pain Goal: 2 ([id number])  Complications: No apparent anesthesia complications

## 2016-02-18 NOTE — Anesthesia Postprocedure Evaluation (Signed)
Anesthesia Post Note  Patient: Psychologist, sport and exercise  Procedure(s) Performed: Procedure(s) (LRB): LUMBAR 1 KYPHOPLASTY (N/A)  Patient location during evaluation: PACU Anesthesia Type: General Level of consciousness: awake and alert Pain management: pain level controlled Vital Signs Assessment: post-procedure vital signs reviewed and stable Respiratory status: spontaneous breathing, nonlabored ventilation and respiratory function stable Cardiovascular status: blood pressure returned to baseline and stable Postop Assessment: no signs of nausea or vomiting Anesthetic complications: no    Last Vitals:  Vitals:   02/18/16 1957 02/18/16 2030  BP: (!) 165/79   Pulse: 74   Resp: 14   Temp:  36.8 C    Last Pain:  Vitals:   02/18/16 1940  TempSrc:   PainSc: 3                  Raelyn Racette,W. EDMOND

## 2016-02-18 NOTE — OR Nursing (Signed)
Discharge instructions were given to pt and pts husband. Both denied any questions regarding discharge teaching and were told if they had any questions to call MD office, number was also given to them. Pt aspen brace was placed on pt prior to discharge and teaching done with that as well. Pt stated that teaching was also done yesterday with her and husband when she was getting fitted for it. VS WNL and pain was well controlled. Dose of prescribed pain medication was given prior to discharged. Pt stated that it had been weeks since she was able to sit up right without terrible pain. Stated that she was feeling great. Pt was wheeled out and helped pt get into car to go home with husband.

## 2016-02-18 NOTE — Anesthesia Preprocedure Evaluation (Signed)
Anesthesia Evaluation  Patient identified by MRN, date of birth, ID band Patient awake    History of Anesthesia Complications (+) PONV and history of anesthetic complications  Airway Mallampati: II  TM Distance: >3 FB Neck ROM: Full    Dental no notable dental hx.    Pulmonary shortness of breath, asthma , sleep apnea and Continuous Positive Airway Pressure Ventilation ,    Pulmonary exam normal        Cardiovascular hypertension, Pt. on medications Normal cardiovascular exam     Neuro/Psych Anxiety    GI/Hepatic negative GI ROS,   Endo/Other  Hypothyroidism Morbid obesity  Renal/GU      Musculoskeletal  (+) Arthritis , Osteoarthritis,    Abdominal   Peds  Hematology   Anesthesia Other Findings   Reproductive/Obstetrics                            Anesthesia Physical Anesthesia Plan  ASA: III  Anesthesia Plan: General   Post-op Pain Management:    Induction: Intravenous  Airway Management Planned: Oral ETT  Additional Equipment: None  Intra-op Plan:   Post-operative Plan: Extubation in OR  Informed Consent: I have reviewed the patients History and Physical, chart, labs and discussed the procedure including the risks, benefits and alternatives for the proposed anesthesia with the patient or authorized representative who has indicated his/her understanding and acceptance.   Dental advisory given  Plan Discussed with:   Anesthesia Plan Comments:         Anesthesia Quick Evaluation

## 2016-02-18 NOTE — Progress Notes (Signed)
Pt states she had a CXR 3-4 weeks ago ordered by Dr Berenice Primas, Not found in epic. Dr Lynann Bologna called and informed states not to do the CXR. Order d/c

## 2016-02-18 NOTE — Progress Notes (Signed)
Dr Al Corpus into see pt and states no need to get CXR.

## 2016-02-18 NOTE — Anesthesia Procedure Notes (Signed)
Procedure Name: Intubation Date/Time: 02/18/2016 5:15 PM Performed by: Lance Coon Pre-anesthesia Checklist: Patient identified, Emergency Drugs available, Suction available, Patient being monitored and Timeout performed Patient Re-evaluated:Patient Re-evaluated prior to inductionOxygen Delivery Method: Circle system utilized Preoxygenation: Pre-oxygenation with 100% oxygen Intubation Type: IV induction Ventilation: Mask ventilation without difficulty Laryngoscope Size: Miller and 2 Grade View: Grade I Tube type: Oral Tube size: 7.0 mm Number of attempts: 1 Airway Equipment and Method: Stylet Placement Confirmation: ETT inserted through vocal cords under direct vision,  CO2 detector and breath sounds checked- equal and bilateral Secured at: 21 cm Tube secured with: Tape Dental Injury: Teeth and Oropharynx as per pre-operative assessment

## 2016-02-19 ENCOUNTER — Encounter (HOSPITAL_COMMUNITY): Payer: Self-pay | Admitting: Orthopedic Surgery

## 2016-02-19 NOTE — Op Note (Signed)
[name], [name] NO.:  [id number]  MEDICAL RECORD NO.:  [id number]  LOCATION:  [facility]                         FACILITY:  [facility]  PHYSICIAN:  Phylliss Bob, MD      DATE OF BIRTH:  10-12-1940  DATE OF PROCEDURE:  02/18/2016 DATE OF DISCHARGE:  02/18/2016                              OPERATIVE REPORT   PREOPERATIVE DIAGNOSIS:  L1 compression fracture.  POSTOPERATIVE DIAGNOSIS:  L1 compression fracture.  PROCEDURE: 1. L1 kyphoplasty. 2. L1 vertebral body biopsy.  SURGEON:  Phylliss Bob, MD.  ASSISTANTPricilla Holm, PA-C.  ANESTHESIA:  General endotracheal anesthesia.  COMPLICATIONS:  None.  DISPOSITION:  Stable.  ESTIMATED BLOOD LOSS:  Minimal.  INDICATIONS FOR SURGERY:  Briefly, [name] is a very pleasant, 75- year-old female, who did present to me with a 3-week history of pain in her back after she sustained a fall.  She had severe pain which she described as 10/10.  The pain was severe and unrelenting.  MRI and radiographs did reveal L1 compression fracture with substantial height loss anteriorly.  The patient was very debilitated by her pain.  I did discuss with her the pros and cons of surgical intervention versus nonoperative measures, and she did wish to proceed with surgical intervention.  We therefore did discuss proceeding with an L1 kyphoplasty.  The patient was fully aware of the risks and limitations of surgery and did elect to proceed.  OPERATIVE DETAILS:  On February 18, 2016, the patient was brought to surgery and general endotracheal anesthesia was administered.  The patient was placed prone on a well-padded flat Jackson bed.  Gel rolls were placed under the patient's chest and hips.  AP and lateral fluoroscopy was brought into the field after prepping and draping, appropriately.  After identifying the appropriate level, I made stab incisions at the upper and lateral aspect of the L1 pedicles.  I then advanced Jamshidi  trocars across the L1 pedicles on the right and left sides.  I then drilled through the trocars into the L1 vertebral body. Of note, there was only very little resistance noted.  There were bone fragments and soft tissue fragments in the drills which were sent off to pathology for additional evaluation.  I then inserted kyphoplasty balloons.  Each balloon was infiltrated with approximately 4 mL of contrast.  I did note partial restoration of the superior endplate.  The balloons were then removed and cement was then introduced into the vertebral body on the right and left sides.  There was excellent interdigitation of cement noted throughout the vertebral body.  There was no abnormal worrisome extravasation of cement posteriorly, laterally, or anteriorly.  The cement was then allowed to harden.  The wounds were then irrigated.  The wound was then closed using 3-0 Monocryl.  Bacitracin and a sterile dressing was applied.  The patient was then awakened from general endotracheal anesthesia and transferred to recovery in stable condition.  Of note, Pricilla Holm was my assistant throughout surgery and did aid in retraction, suctioning, and closure from start to finish.     Phylliss Bob, MD     MD/MEDQ  D:  02/18/2016  T:  02/19/2016  Job:  [id number]  cc:   Roe Coombs, PA

## 2016-04-28 ENCOUNTER — Other Ambulatory Visit: Payer: Self-pay | Admitting: Pulmonary Disease

## 2016-06-14 ENCOUNTER — Telehealth: Payer: Self-pay | Admitting: Pulmonary Disease

## 2016-06-14 NOTE — Telephone Encounter (Signed)
Spoke with the pt  All she needs to ov with VS for OSA f/u  I have transferred her to the schedulers to make this appt  Nothing further needed

## 2016-06-15 ENCOUNTER — Other Ambulatory Visit: Payer: Self-pay | Admitting: Pulmonary Disease

## 2016-06-17 ENCOUNTER — Other Ambulatory Visit: Payer: Self-pay | Admitting: Physician Assistant

## 2016-06-17 DIAGNOSIS — E2839 Other primary ovarian failure: Secondary | ICD-10-CM

## 2016-06-24 ENCOUNTER — Other Ambulatory Visit: Payer: Self-pay | Admitting: Physician Assistant

## 2016-06-24 ENCOUNTER — Ambulatory Visit
Admission: RE | Admit: 2016-06-24 | Discharge: 2016-06-24 | Disposition: A | Discharge status: Home / Self Care | Payer: Medicare Other | Source: Ambulatory Visit | Attending: Physician Assistant | Admitting: Physician Assistant

## 2016-06-24 DIAGNOSIS — Z1231 Encounter for screening mammogram for malignant neoplasm of breast: Secondary | ICD-10-CM

## 2016-06-24 DIAGNOSIS — E2839 Other primary ovarian failure: Secondary | ICD-10-CM

## 2016-08-12 ENCOUNTER — Encounter: Payer: Self-pay | Admitting: Pulmonary Disease

## 2016-08-12 ENCOUNTER — Ambulatory Visit (INDEPENDENT_AMBULATORY_CARE_PROVIDER_SITE_OTHER): Payer: Medicare Other | Admitting: Pulmonary Disease

## 2016-08-12 VITALS — BP 124/82 | HR 67 | Ht 62.0 in | Wt 247.8 lb

## 2016-08-12 DIAGNOSIS — J4530 Mild persistent asthma, uncomplicated: Secondary | ICD-10-CM

## 2016-08-12 DIAGNOSIS — J3089 Other allergic rhinitis: Secondary | ICD-10-CM

## 2016-08-12 DIAGNOSIS — Z6841 Body Mass Index (BMI) 40.0 and over, adult: Secondary | ICD-10-CM

## 2016-08-12 DIAGNOSIS — G4733 Obstructive sleep apnea (adult) (pediatric): Secondary | ICD-10-CM | POA: Diagnosis not present

## 2016-08-12 DIAGNOSIS — Z9989 Dependence on other enabling machines and devices: Secondary | ICD-10-CM

## 2016-08-12 NOTE — Patient Instructions (Signed)
Follow up in 1 year.

## 2016-08-12 NOTE — Progress Notes (Signed)
Current Outpatient Prescriptions on File Prior to Visit  Medication Sig  . albuterol (PROAIR HFA) 108 (90 BASE) MCG/ACT inhaler Inhale 2 puffs into the lungs every 6 (six) hours as needed for wheezing or shortness of breath.  Marland Kitchen azelastine (ASTELIN) 0.1 % nasal spray Place 1 spray into both nostrils daily as needed for allergies.  . Calcium Citrate-Vitamin D (CITRACAL + D PO) Take 2 tablets by mouth daily.  . clobetasol cream (TEMOVATE) AB-123456789 % Apply 1 application topically daily as needed (irritation).  Marland Kitchen dextromethorphan-guaiFENesin (MUCINEX DM) 30-600 MG per 12 hr tablet Take 1 tablet by mouth every 12 (twelve) hours.  Marland Kitchen diltiazem (CARDIZEM CD) 240 MG 24 hr capsule Take 240 mg by mouth daily.  . diphenhydrAMINE (BENADRYL) 25 MG tablet Take 50 mg by mouth 4 (four) times daily.  Marland Kitchen FLOVENT HFA 110 MCG/ACT inhaler USE 2 INHALATIONS DAILY  . furosemide (LASIX) 20 MG tablet Take 60 mg by mouth daily.   Marland Kitchen ketotifen (ZADITOR) 0.025 % ophthalmic solution Place 1 drop into both eyes daily as needed for dry eyes.  Marland Kitchen levothyroxine (SYNTHROID, LEVOTHROID) 88 MCG tablet Take 88 mcg by mouth daily.  Marland Kitchen losartan (COZAAR) 100 MG tablet Take 100 mg by mouth daily.  . Multiple Vitamins-Minerals (MULTIVITAMIN WITH MINERALS) tablet Take 1 tablet by mouth daily.  . mupirocin ointment (BACTROBAN) 2 % Apply 1 application topically daily as needed for pain.  . NON FORMULARY Mega b stress once a day  . Potassium Bicarbonate 99 MG CAPS Take 4 capsules by mouth 3 (three) times daily.  . sertraline (ZOLOFT) 100 MG tablet Take 100 mg by mouth 2 (two) times daily.   Marland Kitchen zolpidem (AMBIEN) 10 MG tablet Take 10 mg by mouth at bedtime as needed for sleep.    No current facility-administered medications on file prior to visit.     Chief Complaint  Patient presents with  . Follow-up    Pt states that her breathing is doing okay. Pt wears CPAP nightly. Denies problems with mask/pressure. DME: [facility]    Sleep tests PSG  12/01/06>>AHI 10, SpO2 low 80%, PLMI 0  CPAP titration 02/16/07>>CPAP 11 cm H2O  CPAP 12/17/13 to 01/15/14 >> used on 30 of 03 nights with average 10 hrs and 17 min. Average AHI is 1.8 with CPAP 11 cm H2O  Pulmonary tests Spirometry 06/06/12>>FEV1 1.48 (69%), FVC 1.95 (70%), FEV1% 76 PFT 07/27/12 >> FEV1 1.71 (88%), FEV1% 77, FEF 25-75% 1.44 (64%), TLC 3.58 (77%), DLCO 72%, no BD.  Past medical history Breast cancer, HTN, Hypothyroidism, Osteoporosis, Anxiety, Collagenous colitis  Past surgical history, Family history, Social history, Allergies reviewed  Vital signs BP 124/82 (BP Location: Right Arm, Cuff Size: Normal)   Pulse 67   Ht 5\' 2"  (1.575 m)   Wt 247 lb 12.8 oz (112.4 kg)   SpO2 97%   BMI 45.32 kg/m   History of Present Illness: [name] is a 76 y.o. 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  She was seen by [name] for asthma flare up about 1 year ago.  She has been doing okay since.  Her main issue is with sinus congestion and post nasal drip.  She also has trouble with cough and wheeze with cold weather, especially with snow.  She has been doing okay recently.  She uses CPAP nightly.  No issues with mask fit.     Physical Exam:  General - pleasant Eyes - wears glasses ENT - no sinus tenderness, no  oral exudate, no LAN, MP 2 Cardiac - regular, no murmur Chest - no wheeze, rales Back - no tenderness Abd - soft, non tender Ext - no edema Neuro - normal strength Skin - no rashes Psych - normal mood   Assessment/Plan:  Obstructive sleep apnea. - she is compliant with therapy and reports benefit from CPAP - continue CPAP 11 cm H2O - she will check with her DME about when she is eligible for a new CPAP machine - will get copy of her download and call her with results  Asthma. - stable - continue flovent two puffs daily and prn albuterol  Allergic rhinitis. - continue astelin   Patient Instructions  Follow up in 1 year    Abigail Mires, MD [facility] Pager:  [phone] 08/12/2016, 12:07 PM

## 2016-09-24 ENCOUNTER — Telehealth: Payer: Self-pay | Admitting: Pulmonary Disease

## 2016-09-24 NOTE — Telephone Encounter (Signed)
CPAP 07/18/16 to 08/16/16 >> used on 30 of 30 nights with average 11 hrs 3 min.  Average AHI 2.1 with CPAP 11 cm H2O   Will have my nurse inform pt that CPAP report looks good.

## 2016-09-27 NOTE — Telephone Encounter (Signed)
Results have been explained to patient, pt expressed understanding. Nothing further needed.  

## 2016-10-12 ENCOUNTER — Telehealth: Payer: Self-pay | Admitting: Pulmonary Disease

## 2016-10-12 MED ORDER — ALBUTEROL SULFATE HFA 108 (90 BASE) MCG/ACT IN AERS: 2.0000 | INHALATION_SPRAY | Freq: Four times a day (QID) | RESPIRATORY_TRACT | 0 refills | Status: DC | PRN

## 2016-10-12 NOTE — Telephone Encounter (Signed)
She states insurance will not cover the one sent to [facility] but she will pay for this one.

## 2016-10-12 NOTE — Telephone Encounter (Signed)
Pt last seen in Feb 2018 Requesting a refill of Proair to [facility] labeled as URGENT Pt aware that this has been refilled and marked as an urgent refill in hopes that she will get this soon as her current inhalers have expired. Nothing further needed.

## 2016-10-12 NOTE — Telephone Encounter (Signed)
Spoke with pt and apologized for the mix up, I [facility] a [facility]. Pt will still pick up one inhaler from the rx that was sent to [facility] due to needing a rescue inhaler. She had no further questions. Nothing further is needed

## 2016-12-20 ENCOUNTER — Other Ambulatory Visit: Payer: Self-pay | Admitting: Pulmonary Disease

## 2017-03-09 ENCOUNTER — Other Ambulatory Visit: Payer: Self-pay | Admitting: Pulmonary Disease

## 2017-05-06 ENCOUNTER — Ambulatory Visit
Admission: RE | Admit: 2017-05-06 | Discharge: 2017-05-06 | Disposition: A | Discharge status: Home / Self Care | Payer: Medicare Other | Source: Ambulatory Visit | Attending: Physical Medicine and Rehabilitation | Admitting: Physical Medicine and Rehabilitation

## 2017-05-06 ENCOUNTER — Other Ambulatory Visit: Payer: Self-pay | Admitting: Physical Medicine and Rehabilitation

## 2017-05-06 DIAGNOSIS — W19XXXA Unspecified fall, initial encounter: Secondary | ICD-10-CM

## 2017-05-06 DIAGNOSIS — M549 Dorsalgia, unspecified: Secondary | ICD-10-CM

## 2017-05-09 ENCOUNTER — Other Ambulatory Visit: Payer: Self-pay | Admitting: Physical Medicine and Rehabilitation

## 2017-05-09 DIAGNOSIS — M545 Low back pain, unspecified: Secondary | ICD-10-CM

## 2017-05-09 DIAGNOSIS — M546 Pain in thoracic spine: Secondary | ICD-10-CM

## 2017-06-16 ENCOUNTER — Other Ambulatory Visit: Payer: Self-pay | Admitting: Physician Assistant

## 2017-06-16 DIAGNOSIS — Z1231 Encounter for screening mammogram for malignant neoplasm of breast: Secondary | ICD-10-CM

## 2017-07-15 ENCOUNTER — Ambulatory Visit: Payer: Medicare Other

## 2017-08-01 ENCOUNTER — Ambulatory Visit
Admission: RE | Admit: 2017-08-01 | Discharge: 2017-08-01 | Disposition: A | Discharge status: Home / Self Care | Payer: Medicare Other | Source: Ambulatory Visit | Attending: Physician Assistant | Admitting: Physician Assistant

## 2017-08-01 DIAGNOSIS — Z1231 Encounter for screening mammogram for malignant neoplasm of breast: Secondary | ICD-10-CM

## 2017-10-16 ENCOUNTER — Other Ambulatory Visit: Payer: Self-pay | Admitting: Pulmonary Disease

## 2017-10-24 ENCOUNTER — Encounter: Payer: Self-pay | Admitting: Pulmonary Disease

## 2017-10-24 ENCOUNTER — Ambulatory Visit (INDEPENDENT_AMBULATORY_CARE_PROVIDER_SITE_OTHER): Payer: Medicare Other | Admitting: Pulmonary Disease

## 2017-10-24 VITALS — BP 128/82 | HR 66 | Ht 60.5 in | Wt 259.0 lb

## 2017-10-24 DIAGNOSIS — J4530 Mild persistent asthma, uncomplicated: Secondary | ICD-10-CM | POA: Diagnosis not present

## 2017-10-24 DIAGNOSIS — J45909 Unspecified asthma, uncomplicated: Secondary | ICD-10-CM

## 2017-10-24 DIAGNOSIS — R05 Cough: Secondary | ICD-10-CM

## 2017-10-24 DIAGNOSIS — R058 Other specified cough: Secondary | ICD-10-CM

## 2017-10-24 LAB — POCT EXHALED NITRIC OXIDE: FeNO level (ppb): 19

## 2017-10-24 MED ORDER — MONTELUKAST SODIUM 10 MG PO TABS: 10.0000 mg | ORAL_TABLET | Freq: Every day | ORAL | 5 refills | Status: DC

## 2017-10-24 MED ORDER — FLUTICASONE PROPIONATE 50 MCG/ACT NA SUSP: 1.0000 | Freq: Every day | NASAL | 2 refills | Status: DC

## 2017-10-24 NOTE — Progress Notes (Signed)
[facility]  Chief Complaint  Patient presents with  . Acute Visit    has finished 3rd round of antibiotics, shortness of breath, productive cough (clear)     Vital signs: BP 128/82 (BP Location: Right Arm, Cuff Size: Normal)   Pulse 66   Ht 5' 0.5" (1.537 m)   Wt 259 lb (117.5 kg)   SpO2 95%   BMI 49.75 kg/m   History of Present Illness: [name] is a 77 y.o. 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  She developed a productive cough on March 17.  She was bringing up yellow sputum and feeling fatigued.  Has sinus congestion and post nasal drip.  Was getting wheeze, short of breath and chest tight.  Has some heart burn.  Was tx with zpak, two rounds of doxycycline, and two rounds of prednisone.  She felt better when on prednisone.  Has been using albuterol more.  Has been using astelin, and benadryl.  Uses mucinex.  Took pepto bismol last night.  Physical Exam:  General - pleasant Eyes - pupils reactive ENT - no sinus tenderness, no oral exudate, no LAN Cardiac - regular, no murmur Chest - no wheeze, rales Abd - soft, non tender Ext - no edema Skin - no rashes Neuro - normal strength Psych - normal mood  Discussion: She likely developed an acute respiratory infection in March that triggered asthmatic bronchitis.  I think she has cleared the infectious component and doesn't need additional antibiotics.  I think she needs more aggressive management of her sinuses and asthma.  Assessment/Plan:  Acute asthmatic bronchitis. - flovent two puffs bid - singulair 10 mg nightly - continue prn albuterol - don't think she needs additional ABx or prednisone - continue mucinex  Upper airway cough syndrome. - astelin bid - flonase daily - singulair  Obstructive sleep apnea. - she is compliant with CPAP and reports benefit - continue CPAP 11 cm H2O   Patient Instructions  Flovent two puffs twice per day, and rinse  mouth after each use Flonase 1 spray in each nostril daily Singulair 10 mg pill nightly Astelin 1 spray in each nostril twice per day  Follow up in 3 weeks with Dr. Halford Chessman or Nurse Practitioner    Abigail Mires, MD [place] 10/24/2017, 12:14 PM  Flow Sheet  Sleep tests: PSG 12/01/06>>AHI 10, SpO2 low 80%, PLMI 0  CPAP titration 02/16/07>>CPAP 11 cm H2O  CPAP 12/17/13 to 01/15/14 >> used on 30 of 03 nights with average 10 hrs and 17 min. Average AHI is 1.8 with CPAP 11 cm H2O  Pulmonary tests Spirometry 06/06/12>>FEV1 1.48 (69%), FVC 1.95 (70%), FEV1% 76 PFT 07/27/12 >> FEV1 1.71 (88%), FEV1% 77, FEF 25-75% 1.44 (64%), TLC 3.58 (77%), DLCO 72%, no BD. Spirometry 10/24/17 >> FEV1 1.15 (63%), FEV1% 83 FeNO 10/24/17 >> 19  Past Medical History: She  has a past medical history of Anemia, Anxiety, Asthma, Breast cancer ([place]), Collagenous colitis, Compression fracture, Hypertension, Hypothyroidism, Osteoarthritis, Osteopenia, PONV (postoperative nausea and vomiting), and Sleep apnea.  Past Surgical History: She  has a past surgical history that includes Modified radical mastectomy w/ axillary lymph node dissection; Breast reduction surgery; Toe Surgery; Septoplasty; Cesarean section; Tonsillectomy and adenoidectomy; right foot surgery; Reconstruction breast w/ latissimus dorsi flap; Colonoscopy w/ biopsies and polypectomy; Cataract extraction w/ intraocular lens  implant, bilateral; and Kyphoplasty (N/A, 02/18/2016).  Family History: Her family history includes Brain cancer in her maternal aunt; Breast  cancer in her maternal aunt and unknown relative; Hypertension in her brother, father, and mother; Kidney cancer in her paternal uncle and unknown relative; Lung cancer in her mother; Ovarian cancer in her sister and unknown relative; Prostate cancer in her paternal uncle.  Social History: She  reports that she has never smoked. She has never used smokeless tobacco. She reports  that she does not drink alcohol or use drugs.  Medications: Allergies as of 10/24/2017      Reactions   Adhesive [tape] Other (See Comments)   UNSPECIFIED    Bactrim [sulfamethoxazole-trimethoprim] Other (See Comments)   UNSPECIFIED   Dairy Aid [lactase] Other (See Comments)   Ice cream, sour cream   Ibuprofen Swelling   Patient reports swelling of legs    Sulfa Antibiotics Other (See Comments)   UNSPECIFIED      Medication List        Accurate as of 10/24/17 12:14 PM. Always use your most recent med list.          AMBIEN 10 MG tablet Generic drug:  zolpidem Take 10 mg by mouth at bedtime as needed for sleep.   azelastine 0.1 % nasal spray Commonly known as:  ASTELIN Place 1 spray into both nostrils daily as needed for allergies.   CITRACAL + D PO Take 2 tablets by mouth daily.   clobetasol cream 0.05 % Commonly known as:  TEMOVATE Apply 1 application topically daily as needed (irritation).   dextromethorphan-guaiFENesin 30-600 MG 12hr tablet Commonly known as:  MUCINEX DM Take 1 tablet by mouth every 12 (twelve) hours.   diltiazem 240 MG 24 hr capsule Commonly known as:  CARDIZEM CD Take 240 mg by mouth daily.   diphenhydrAMINE 25 MG tablet Commonly known as:  BENADRYL Take 50 mg by mouth 4 (four) times daily.   FLOVENT HFA 110 MCG/ACT inhaler Generic drug:  fluticasone USE 2 INHALATIONS DAILY   fluticasone 50 MCG/ACT nasal spray Commonly known as:  FLONASE Place 1 spray into both nostrils daily.   furosemide 20 MG tablet Commonly known as:  LASIX Take 60 mg by mouth daily.   ketotifen 0.025 % ophthalmic solution Commonly known as:  ZADITOR Place 1 drop into both eyes daily as needed for dry eyes.   levothyroxine 88 MCG tablet Commonly known as:  SYNTHROID, LEVOTHROID Take 88 mcg by mouth daily.   losartan 100 MG tablet Commonly known as:  COZAAR Take 100 mg by mouth daily.   montelukast 10 MG tablet Commonly known as:  SINGULAIR Take 1  tablet (10 mg total) by mouth at bedtime.   multivitamin with minerals tablet Take 1 tablet by mouth daily.   mupirocin ointment 2 % Commonly known as:  BACTROBAN Apply 1 application topically daily as needed for pain.   NON FORMULARY Mega b stress once a day   Potassium Bicarbonate 99 MG Caps Take 4 capsules by mouth 3 (three) times daily.   PROAIR HFA 108 (90 Base) MCG/ACT inhaler Generic drug:  albuterol USE 2 INHALATIONS EVERY 6 HOURS AS NEEDED FOR WHEEZING OR SHORTNESS OF BREATH   sertraline 100 MG tablet Commonly known as:  ZOLOFT Take 100 mg by mouth 2 (two) times daily.

## 2017-10-24 NOTE — Patient Instructions (Addendum)
Flovent two puffs twice per day, and rinse mouth after each use Flonase 1 spray in each nostril daily Singulair 10 mg pill nightly Astelin 1 spray in each nostril twice per day  Follow up in 3 weeks with Dr. Halford Chessman or Nurse Practitioner

## 2017-10-27 ENCOUNTER — Telehealth: Payer: Self-pay | Admitting: Pulmonary Disease

## 2017-10-27 NOTE — Telephone Encounter (Signed)
Don't think singulair or flonase are causing weight gain.  She can take extra 60 mg lasix this afternoon, and monitor her weight.

## 2017-10-27 NOTE — Telephone Encounter (Signed)
Pt c/o b/l ankle swelling, notes a 10lb weight gain over the past 3 days- went from 247lb to 257lb.  Denies any chest pain/tightness, dyspnea.  Notes minimal salt intake.   Pt takes 60mg  lasix daily.   Pt notes that she started singulair on 4/22, wonders if this is related to this.    Pt also started flonase at the same time.    [facility] uses [facility] on [address]. Requesting additional recs.    VS please advise on recs.  Thanks!

## 2017-10-27 NOTE — Telephone Encounter (Signed)
Spoke with pt, aware of recs.  Nothing further needed at this time.  

## 2017-11-01 ENCOUNTER — Ambulatory Visit (INDEPENDENT_AMBULATORY_CARE_PROVIDER_SITE_OTHER): Payer: Medicare Other | Admitting: Pulmonary Disease

## 2017-11-01 ENCOUNTER — Encounter: Payer: Self-pay | Admitting: Pulmonary Disease

## 2017-11-01 VITALS — BP 134/86 | HR 77 | Ht 61.0 in | Wt 245.0 lb

## 2017-11-01 DIAGNOSIS — G4733 Obstructive sleep apnea (adult) (pediatric): Secondary | ICD-10-CM | POA: Diagnosis not present

## 2017-11-01 DIAGNOSIS — J4530 Mild persistent asthma, uncomplicated: Secondary | ICD-10-CM

## 2017-11-01 DIAGNOSIS — Z9989 Dependence on other enabling machines and devices: Secondary | ICD-10-CM

## 2017-11-01 NOTE — Patient Instructions (Signed)
If breathing is okay, then can try stopping singulair in 2 weeks.  If breathing okay 2 weeks after stopping singulair, then can change flovent to once per day.  Will arrange for new CPAP machine.  Follow up in 2 months.

## 2017-11-01 NOTE — Progress Notes (Signed)
[facility]  Chief Complaint  Patient presents with  . Follow-up    Patient is doing well overall with cpap machine; pt is requesting new machine, been over  65yrs.    Vital signs: BP 134/86 (BP Location: Left Arm, Cuff Size: Normal)   Pulse 77   Ht 5\' 1"  (1.549 m)   Wt 245 lb (111.1 kg)   SpO2 97%   BMI 46.29 kg/m   History of Present Illness: [name] is a 77 y.o. 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  Breathing better.  Not having as much cough, wheeze, or sputum.  Sinuses better.  Swelling better also.  Got call from "medicare" saying she was eligible for a new CPAP machine and needed an appointment.  Physical Exam:  General - pleasant Eyes - pupils reactive ENT - no sinus tenderness, no oral exudate, no LAN Cardiac - regular, no murmur Chest - no wheeze, rales Abd - soft, non tender Ext - no edema Skin - no rashes Neuro - normal strength Psych - normal mood   Assessment/Plan:  Persistent asthma. - she can try stopping singulair if she remains stable after another two weeks, and then try reducing to flovent daily - prn albuterol  Upper airway cough syndrome. - can try changing astelin and flonase to prn  Obstructive sleep apnea. - she is compliant with CPAP and reports benefit - will arrange for new CPAP machine since her device is more than 75 yrs old - continue CPAP 11 cm H2O   Patient Instructions  If breathing is okay, then can try stopping singulair in 2 weeks.  If breathing okay 2 weeks after stopping singulair, then can change flovent to once per day.  Will arrange for new CPAP machine.  Follow up in 2 months.    Chesley Mires, MD [facility] 11/01/2017, 12:31 PM  Flow Sheet  Sleep tests: PSG 12/01/06>>AHI 10, SpO2 low 80%, PLMI 0  CPAP titration 02/16/07>>CPAP 11 cm H2O  CPAP 12/17/13 to 01/15/14 >> used on 30 of 03 nights with average 10 hrs and 17 min.  Average AHI is 1.8 with CPAP 11 cm H2O  Pulmonary tests Spirometry 06/06/12>>FEV1 1.48 (69%), FVC 1.95 (70%), FEV1% 76 PFT 07/27/12 >> FEV1 1.71 (88%), FEV1% 77, FEF 25-75% 1.44 (64%), TLC 3.58 (77%), DLCO 72%, no BD. Spirometry 10/24/17 >> FEV1 1.15 (63%), FEV1% 83 FeNO 10/24/17 >> 19  Past Medical History: She  has a past medical history of Anemia, Anxiety, Asthma, Breast cancer ([place]), Collagenous colitis, Compression fracture, Hypertension, Hypothyroidism, Osteoarthritis, Osteopenia, PONV (postoperative nausea and vomiting), and Sleep apnea.  Past Surgical History: She  has a past surgical history that includes Modified radical mastectomy w/ axillary lymph node dissection; Breast reduction surgery; Toe Surgery; Septoplasty; Cesarean section; Tonsillectomy and adenoidectomy; right foot surgery; Reconstruction breast w/ latissimus dorsi flap; Colonoscopy w/ biopsies and polypectomy; Cataract extraction w/ intraocular lens  implant, bilateral; and Kyphoplasty (N/A, 02/18/2016).  Family History: Her family history includes Brain cancer in her maternal aunt; Breast cancer in her maternal aunt and unknown relative; Hypertension in her brother, father, and mother; Kidney cancer in her paternal uncle and unknown relative; Lung cancer in her mother; Ovarian cancer in her sister and unknown relative; Prostate cancer in her paternal uncle.  Social History: She  reports that she has never smoked. She has never used smokeless tobacco. She reports that she does not drink alcohol or use drugs.  Medications: Allergies as  of 11/01/2017      Reactions   Adhesive [tape] Other (See Comments)   UNSPECIFIED    Bactrim [sulfamethoxazole-trimethoprim] Other (See Comments)   UNSPECIFIED   Dairy Aid [lactase] Other (See Comments)   Ice cream, sour cream   Ibuprofen Swelling   Patient reports swelling of legs    Sulfa Antibiotics Other (See Comments)   UNSPECIFIED      Medication List        Accurate as of  11/01/17 12:31 PM. Always use your most recent med list.          AMBIEN 10 MG tablet Generic drug:  zolpidem Take 10 mg by mouth at bedtime as needed for sleep.   azelastine 0.1 % nasal spray Commonly known as:  ASTELIN Place 1 spray into both nostrils daily as needed for allergies.   CITRACAL + D PO Take 2 tablets by mouth daily.   clobetasol cream 0.05 % Commonly known as:  TEMOVATE Apply 1 application topically daily as needed (irritation).   dextromethorphan-guaiFENesin 30-600 MG 12hr tablet Commonly known as:  MUCINEX DM Take 1 tablet by mouth every 12 (twelve) hours.   diltiazem 240 MG 24 hr capsule Commonly known as:  CARDIZEM CD Take 240 mg by mouth daily.   diphenhydrAMINE 25 MG tablet Commonly known as:  BENADRYL Take 50 mg by mouth 4 (four) times daily.   FLOVENT HFA 110 MCG/ACT inhaler Generic drug:  fluticasone USE 2 INHALATIONS DAILY   fluticasone 50 MCG/ACT nasal spray Commonly known as:  FLONASE Place 1 spray into both nostrils daily.   furosemide 20 MG tablet Commonly known as:  LASIX Take 60 mg by mouth daily.   ketotifen 0.025 % ophthalmic solution Commonly known as:  ZADITOR Place 1 drop into both eyes daily as needed for dry eyes.   levothyroxine 88 MCG tablet Commonly known as:  SYNTHROID, LEVOTHROID Take 88 mcg by mouth daily.   losartan 100 MG tablet Commonly known as:  COZAAR Take 100 mg by mouth daily.   montelukast 10 MG tablet Commonly known as:  SINGULAIR Take 1 tablet (10 mg total) by mouth at bedtime.   multivitamin with minerals tablet Take 1 tablet by mouth daily.   mupirocin ointment 2 % Commonly known as:  BACTROBAN Apply 1 application topically daily as needed for pain.   NON FORMULARY Mega b stress once a day   Potassium Bicarbonate 99 MG Caps Take 4 capsules by mouth 3 (three) times daily.   PROAIR HFA 108 (90 Base) MCG/ACT inhaler Generic drug:  albuterol USE 2 INHALATIONS EVERY 6 HOURS AS NEEDED FOR  WHEEZING OR SHORTNESS OF BREATH   sertraline 100 MG tablet Commonly known as:  ZOLOFT Take 100 mg by mouth 2 (two) times daily.

## 2017-11-04 ENCOUNTER — Telehealth: Payer: Self-pay | Admitting: Pulmonary Disease

## 2017-11-04 DIAGNOSIS — J4530 Mild persistent asthma, uncomplicated: Secondary | ICD-10-CM

## 2017-11-04 MED ORDER — AZELASTINE HCL 0.15 % NA SOLN: 1.0000 | Freq: Every day | NASAL | 1 refills | Status: DC

## 2017-11-04 NOTE — Telephone Encounter (Signed)
Rx sent to [facility]. Nothing further is needed.

## 2017-11-04 NOTE — Telephone Encounter (Signed)
Can change to generic version of astelin - azelastine.  If this isn't available, then can change to astepro.

## 2017-11-04 NOTE — Telephone Encounter (Signed)
Dr. Halford Chessman can we change the Astelin to a different medication. Astelin is no longer manufactured according to the pharmacist at [facility]. Please advise.     Order Details  Dose: 1 spray Route: Each Nare Frequency: Daily PRN for allergies  Dispense Quantity: -- Refills: -- Fills remaining: --        Sig: Place 1 spray into both nostrils daily as needed for allergies.       Written Date: -- Expiration Date: -- Ordering Date: 02/13/16   Start Date: 01/17/16 End Date: --         Ordering Provider:  -- DEA #:  [id number] NPI:  --   Authorizing Provider:  [provider] DEA #:  -- NPI:  [id number]   Ordering User:  Isidor Holts, CPhT          Medication Notes     Ethlyn Daniels A, CPhT -- 02/13/16 11:56 AM     >> Ethlyn Daniels A  Fri Feb 13, 2016 11:56 AM

## 2017-11-14 ENCOUNTER — Ambulatory Visit: Payer: Medicare Other | Admitting: Acute Care

## 2017-12-14 ENCOUNTER — Other Ambulatory Visit: Payer: Self-pay | Admitting: Endocrinology

## 2017-12-14 DIAGNOSIS — E049 Nontoxic goiter, unspecified: Secondary | ICD-10-CM

## 2017-12-23 ENCOUNTER — Ambulatory Visit
Admission: RE | Admit: 2017-12-23 | Discharge: 2017-12-23 | Disposition: A | Discharge status: Home / Self Care | Payer: Medicare Other | Source: Ambulatory Visit | Attending: Endocrinology | Admitting: Endocrinology

## 2017-12-23 DIAGNOSIS — E049 Nontoxic goiter, unspecified: Secondary | ICD-10-CM

## 2018-03-07 ENCOUNTER — Encounter: Payer: Self-pay | Admitting: Pulmonary Disease

## 2018-03-07 ENCOUNTER — Ambulatory Visit: Payer: Medicare Other | Admitting: Pulmonary Disease

## 2018-03-07 ENCOUNTER — Ambulatory Visit (INDEPENDENT_AMBULATORY_CARE_PROVIDER_SITE_OTHER): Payer: Medicare Other | Admitting: Pulmonary Disease

## 2018-03-07 VITALS — BP 124/78 | HR 75 | Ht 62.0 in | Wt 256.0 lb

## 2018-03-07 DIAGNOSIS — Z23 Encounter for immunization: Secondary | ICD-10-CM | POA: Diagnosis not present

## 2018-03-07 DIAGNOSIS — G4733 Obstructive sleep apnea (adult) (pediatric): Secondary | ICD-10-CM | POA: Diagnosis not present

## 2018-03-07 DIAGNOSIS — R05 Cough: Secondary | ICD-10-CM

## 2018-03-07 DIAGNOSIS — Z6841 Body Mass Index (BMI) 40.0 and over, adult: Secondary | ICD-10-CM

## 2018-03-07 DIAGNOSIS — J4530 Mild persistent asthma, uncomplicated: Secondary | ICD-10-CM

## 2018-03-07 DIAGNOSIS — Z9989 Dependence on other enabling machines and devices: Secondary | ICD-10-CM

## 2018-03-07 DIAGNOSIS — R058 Other specified cough: Secondary | ICD-10-CM

## 2018-03-07 MED ORDER — FLUTICASONE PROPIONATE 50 MCG/ACT NA SUSP: 1.0000 | Freq: Every day | NASAL | 2 refills | Status: DC

## 2018-03-07 NOTE — Patient Instructions (Signed)
Saline nasal spray nightly Fluticasone (flonase) nasal spray, 1 spray in each nostril daily Continue using astelin and montelukast Will call with results of CPAP report High dose flu shot today  Follow up in 6 months

## 2018-03-07 NOTE — Progress Notes (Signed)
[name]  Chief Complaint  Patient presents with  . Follow-up    Patient is doing well overall with cpap machine. Will call [facility] for DL.    Constitutional: BP 124/78 (BP Location: Left Arm, Cuff Size: Normal)   Pulse 75   Ht 5\' 2"  (1.575 m)   Wt 256 lb (116.1 kg)   SpO2 96%   BMI 46.82 kg/m   History of Present Illness: [name] is a 77 y.o. 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  She has been getting more sinus congestion and post nasal drip.  This happens mostly at night.  She has to wake up and take off her CPAP to blow her nose.  She brings up clear sputum.  She isn't having heading, ear pain, sore throat, wheeze, chest tightness, skin rash, or fever.  Not having reflux or aerophagia.  Using astelin and had to restart montelukast 3 weeks ago.  Hasn't been using flonase or nasal irrigation recently.  Loves using CPAP.  Can't sleep w/o it.  No issues with mask fit.  Using flovent two puffs in the morning.   Comprehensive Respiratory Exam:  Appearance - well kempt  ENMT - nasal mucosa moist, turbinates clear, midline nasal septum, no dental lesions, no gingival bleeding, no oral exudates, no tonsillar hypertrophy Neck - no masses, trachea midline, no thyromegaly, no elevation in JVP Respiratory - normal appearance of chest wall, normal respiratory effort w/o accessory muscle use, no dullness on percussion, no wheezing or rales CV - s1s2 regular rate and rhythm, no murmurs, no peripheral edema, radial pulses symmetric GI - soft, non tender, no masses Lymph - no adenopathy noted in neck and axillary areas MSK - normal muscle strength and tone, normal gait Ext - no cyanosis, clubbing, or joint inflammation noted Skin - no rashes, lesions, or ulcers Neuro - oriented to person, place, and time Psych - normal mood and affect  Assessment/Plan:  Upper airway cough syndrome. - add nasal irrigation and flonase -  continue astelin and montelukast  Persistent asthma. - continue flovent two puffs daily - continue montelukast - prn albuterol - high dose flu shot today  Obstructive sleep apnea. - she is compliant with CPAP and reports benefit from therapy - continue CPAP 11 cm H2O - will call her with results of CPAP download  Obesity. - discussed options to assist with weight loss   Patient Instructions  Saline nasal spray nightly Fluticasone (flonase) nasal spray, 1 spray in each nostril daily Continue using astelin and montelukast Will call with results of CPAP report High dose flu shot today  Follow up in 6 months    Chesley Mires, MD [facility] 03/07/2018, 10:05 AM  Flow Sheet  Sleep tests: PSG 12/01/06>>AHI 10, SpO2 low 80%, PLMI 0  CPAP titration 02/16/07>>CPAP 11 cm H2O  CPAP 12/17/13 to 01/15/14 >> used on 30 of 03 nights with average 10 hrs and 17 min. Average AHI is 1.8 with CPAP 11 cm H2O  Pulmonary tests Spirometry 06/06/12>>FEV1 1.48 (69%), FVC 1.95 (70%), FEV1% 76 PFT 07/27/12 >> FEV1 1.71 (88%), FEV1% 77, FEF 25-75% 1.44 (64%), TLC 3.58 (77%), DLCO 72%, no BD. Spirometry 10/24/17 >> FEV1 1.15 (63%), FEV1% 83 FeNO 10/24/17 >> 19  Past Medical History: She  has a past medical history of Anemia, Anxiety, Asthma, Breast cancer ([place]), Collagenous colitis, Compression fracture, Hypertension, Hypothyroidism, Osteoarthritis, Osteopenia, PONV (postoperative nausea and vomiting), and Sleep apnea.  Past Surgical History:  She  has a past surgical history that includes Modified radical mastectomy w/ axillary lymph node dissection; Breast reduction surgery; Toe Surgery; Septoplasty; Cesarean section; Tonsillectomy and adenoidectomy; right foot surgery; Reconstruction breast w/ latissimus dorsi flap; Colonoscopy w/ biopsies and polypectomy; Cataract extraction w/ intraocular lens  implant, bilateral; and Kyphoplasty (N/A, 02/18/2016).  Family History: Her family  history includes Brain cancer in her maternal aunt; Breast cancer in her maternal aunt and unknown relative; Hypertension in her brother, father, and mother; Kidney cancer in her paternal uncle and unknown relative; Lung cancer in her mother; Ovarian cancer in her sister and unknown relative; Prostate cancer in her paternal uncle.  Social History: She  reports that she has never smoked. She has never used smokeless tobacco. She reports that she does not drink alcohol or use drugs.  Medications: Allergies as of 03/07/2018      Reactions   Adhesive [tape] Other (See Comments)   UNSPECIFIED    Bactrim [sulfamethoxazole-trimethoprim] Other (See Comments)   UNSPECIFIED   Dairy Aid [lactase] Other (See Comments)   Ice cream, sour cream   Ibuprofen Swelling   Patient reports swelling of legs    Sulfa Antibiotics Other (See Comments)   UNSPECIFIED      Medication List        Accurate as of 03/07/18 10:05 AM. Always use your most recent med list.          AMBIEN 10 MG tablet Generic drug:  zolpidem Take 10 mg by mouth at bedtime as needed for sleep.   azelastine 0.1 % nasal spray Commonly known as:  ASTELIN Place 1 spray into both nostrils daily as needed for allergies.   CITRACAL + D PO Take 2 tablets by mouth daily.   clobetasol cream 0.05 % Commonly known as:  TEMOVATE Apply 1 application topically daily as needed (irritation).   dextromethorphan-guaiFENesin 30-600 MG 12hr tablet Commonly known as:  MUCINEX DM Take 1 tablet by mouth every 12 (twelve) hours.   diltiazem 240 MG 24 hr capsule Commonly known as:  CARDIZEM CD Take 240 mg by mouth daily.   diphenhydrAMINE 25 MG tablet Commonly known as:  BENADRYL Take 50 mg by mouth 4 (four) times daily.   FLOVENT HFA 110 MCG/ACT inhaler Generic drug:  fluticasone USE 2 INHALATIONS DAILY   fluticasone 50 MCG/ACT nasal spray Commonly known as:  FLONASE Place 1 spray into both nostrils at bedtime.   furosemide 20 MG  tablet Commonly known as:  LASIX Take 60 mg by mouth daily.   levothyroxine 88 MCG tablet Commonly known as:  SYNTHROID, LEVOTHROID Take 88 mcg by mouth daily.   losartan 100 MG tablet Commonly known as:  COZAAR Take 100 mg by mouth daily.   montelukast 10 MG tablet Commonly known as:  SINGULAIR Take 1 tablet (10 mg total) by mouth at bedtime.   multivitamin with minerals tablet Take 1 tablet by mouth daily.   mupirocin ointment 2 % Commonly known as:  BACTROBAN Apply 1 application topically daily as needed for pain.   NON FORMULARY Mega b stress once a day   Potassium Bicarbonate 99 MG Caps Take 4 capsules by mouth 3 (three) times daily.   PROAIR HFA 108 (90 Base) MCG/ACT inhaler Generic drug:  albuterol USE 2 INHALATIONS EVERY 6 HOURS AS NEEDED FOR WHEEZING OR SHORTNESS OF BREATH   sertraline 100 MG tablet Commonly known as:  ZOLOFT Take 100 mg by mouth 2 (two) times daily.

## 2018-03-17 ENCOUNTER — Telehealth: Payer: Self-pay | Admitting: Pulmonary Disease

## 2018-03-17 NOTE — Telephone Encounter (Signed)
Spoke with the pt  She is requesting her CPAP DL results  I printed from [facility] and placed in VS's lookat  Also faxed copy to her PCP per her request [phone])  VS- please advise on DL once reviewed, thanks!

## 2018-03-20 NOTE — Telephone Encounter (Signed)
Spoke with patient, made aware that we received the DL and VS is aware she is awaiting it to be reviewed. Voiced understanding.

## 2018-03-21 NOTE — Telephone Encounter (Signed)
CPAP 02/06/18 to 03/07/18 >> used on 30 of 30 nights with average 11 hrs 14 min.  Average AHI 2.1 with CPAP 11 cm H2O.   Please let her know CPAP report shows good control with current settings.

## 2018-03-22 NOTE — Telephone Encounter (Signed)
Called patient unable to reach left message to give us a call back.

## 2018-03-23 NOTE — Telephone Encounter (Signed)
Called spoke with patient, discussed CPAP download results in length Patient voiced her understanding and denied any further questions Patient requests this be faxed to her PCP, attn Joni Reining PA Faxed via epic   Nothing further needed; will sign off

## 2018-06-09 ENCOUNTER — Encounter: Payer: Self-pay | Admitting: Plastic Surgery

## 2018-06-09 ENCOUNTER — Ambulatory Visit (INDEPENDENT_AMBULATORY_CARE_PROVIDER_SITE_OTHER): Payer: Medicare Other | Admitting: Plastic Surgery

## 2018-06-09 DIAGNOSIS — Z853 Personal history of malignant neoplasm of breast: Secondary | ICD-10-CM | POA: Diagnosis not present

## 2018-06-09 DIAGNOSIS — Z9882 Breast implant status: Secondary | ICD-10-CM

## 2018-06-09 DIAGNOSIS — Z9889 Other specified postprocedural states: Secondary | ICD-10-CM

## 2018-06-09 NOTE — Progress Notes (Addendum)
Patient ID: [name], female    DOB: [birth year], 77 y.o.   MRN: [id number]   Chief Complaint  Patient presents with  . Breast Problem    You the patient is a 77 year old white female here for evaluation of her breast reconstruction.  The patient underwent a left mastectomy around 1988.  In 2008 she decided to have reconstruction.  She underwent a left expander placement and latissimus muscle flap.  She had complication at some point that required the implant to be removed.  She was able to have reconstruction again and now has a silicone implant in place with the latissimus flap with the skin flap at the lateral aspect of the left breast.  She underwent a reduction of the right breast.  She has good support in size.  She did not have nipple areolar reconstruction.  Her surgeries were in [place], [place].  She showed me pictures of her chest and breast area over the past year where it has gotten extremely red.  She is very concerned about recurrence of cancer or another issue.  She states that the redness has been going on since 2009.  She has seen a dermatologist and her primary care physician.  So far there is no definitive understanding of why this redness comes and goes intermittently.  She states that does not appear to be seasonal.  She is not aware of any sensitivity or allergy.  It resolved without antibiotics.  She denies any radiation.  He does not have any redness today.  There is no sign of infection.  She gets mammograms every 2 to 3 years. Her last one was about a year ago but only on the right not on the left side.  Had MRIs but of her back.  None of her breast that she is aware of.  On exam I do not feel any lumps or bumps of concern.  She has no tenderness.   Review of Systems  Constitutional: Negative.  Negative for activity change and appetite change.  HENT: Negative.   Eyes: Negative.   Respiratory: Negative.   Cardiovascular: Negative.   Gastrointestinal: Negative.     Endocrine: Negative.   Genitourinary: Negative.   Musculoskeletal: Negative.   Neurological: Negative.   Psychiatric/Behavioral: Negative.     Past Medical History:  Diagnosis Date  . Anemia    PMH  . Anxiety   . Asthma   . Breast cancer ([place])   . Collagenous colitis   . Compression fracture    lumbar 1  . Hypertension   . Hypothyroidism   . Osteoarthritis   . Osteopenia   . PONV (postoperative nausea and vomiting)   . Sleep apnea     Past Surgical History:  Procedure Laterality Date  . BREAST REDUCTION SURGERY     Right  . CATARACT EXTRACTION W/ INTRAOCULAR LENS  IMPLANT, BILATERAL    . CESAREAN SECTION    . COLONOSCOPY W/ BIOPSIES AND POLYPECTOMY    . KYPHOPLASTY N/A 02/18/2016   Procedure: LUMBAR 1 KYPHOPLASTY;  Surgeon: Phylliss Bob, MD;  Location: [facility];  Service: Orthopedics;  Laterality: N/A;  LUMBAR 1 KYPHOPLASTY  . MODIFIED RADICAL MASTECTOMY W/ AXILLARY LYMPH NODE DISSECTION     Left  . RECONSTRUCTION BREAST W/ LATISSIMUS DORSI FLAP     left  . right foot surgery     corrected hammer toe, straightened big toe  . SEPTOPLASTY    . TOE SURGERY    . TONSILLECTOMY AND ADENOIDECTOMY  Current Outpatient Medications:  .  azelastine (ASTELIN) 0.1 % nasal spray, Place 1 spray into both nostrils daily as needed for allergies., Disp: , Rfl:  .  Calcium Citrate-Vitamin D (CITRACAL + D PO), Take 2 tablets by mouth daily., Disp: , Rfl:  .  clobetasol cream (TEMOVATE) 0.38 %, Apply 1 application topically daily as needed (irritation)., Disp: , Rfl:  .  dextromethorphan-guaiFENesin (MUCINEX DM) 30-600 MG per 12 hr tablet, Take 1 tablet by mouth every 12 (twelve) hours., Disp: , Rfl:  .  diltiazem (CARDIZEM CD) 240 MG 24 hr capsule, Take 240 mg by mouth daily., Disp: , Rfl:  .  diphenhydrAMINE (BENADRYL) 25 MG tablet, Take 50 mg by mouth 4 (four) times daily., Disp: , Rfl:  .  FLOVENT HFA 110 MCG/ACT inhaler, USE 2 INHALATIONS DAILY, Disp: 36 g, Rfl: 3 .   fluticasone (FLONASE) 50 MCG/ACT nasal spray, Place 1 spray into both nostrils at bedtime., Disp: 16 g, Rfl: 2 .  furosemide (LASIX) 20 MG tablet, Take 60 mg by mouth daily. , Disp: , Rfl:  .  levothyroxine (SYNTHROID, LEVOTHROID) 88 MCG tablet, Take 88 mcg by mouth daily., Disp: , Rfl:  .  losartan (COZAAR) 100 MG tablet, Take 100 mg by mouth daily., Disp: , Rfl:  .  montelukast (SINGULAIR) 10 MG tablet, Take 1 tablet (10 mg total) by mouth at bedtime., Disp: 30 tablet, Rfl: 5 .  Multiple Vitamins-Minerals (MULTIVITAMIN WITH MINERALS) tablet, Take 1 tablet by mouth daily., Disp: , Rfl:  .  mupirocin ointment (BACTROBAN) 2 %, Apply 1 application topically daily as needed for pain., Disp: , Rfl:  .  NON FORMULARY, Mega b stress once a day, Disp: , Rfl:  .  Potassium Bicarbonate 99 MG CAPS, Take 4 capsules by mouth 3 (three) times daily., Disp: , Rfl:  .  PROAIR HFA 108 (90 Base) MCG/ACT inhaler, USE 2 INHALATIONS EVERY 6 HOURS AS NEEDED FOR WHEEZING OR SHORTNESS OF BREATH, Disp: 25.5 g, Rfl: 3 .  sertraline (ZOLOFT) 100 MG tablet, Take 100 mg by mouth 2 (two) times daily. , Disp: , Rfl:  .  zolpidem (AMBIEN) 10 MG tablet, Take 10 mg by mouth at bedtime as needed for sleep. , Disp: , Rfl:    Objective:   Vitals:   06/09/18 1438  BP: 138/88  Pulse: 85  Resp: 20  SpO2: 97%    Physical Exam  Constitutional: She is oriented to person, place, and time. She appears well-developed and well-nourished.  HENT:  Head: Normocephalic and atraumatic.  Eyes: Pupils are equal, round, and reactive to light. EOM are normal.  Cardiovascular: Normal rate.  Pulmonary/Chest: Effort normal.  Abdominal: Soft. She exhibits no distension. There is no tenderness.  Neurological: She is alert and oriented to person, place, and time.  Skin: Skin is warm. No rash noted. No erythema.  Psychiatric: She has a normal mood and affect. Her behavior is normal. Judgment and thought content normal.    Assessment & Plan:    History of breast cancer in female  History of reconstruction of both breasts   We will try to get her old records from [place].  I recommend high-protein diet with a decrease in her carbohydrates and her sugars for purposes of healing.  Add a multivitamin daily and vitamin C twice a day. I also recommend that she return to Dr. Jana Hakim for further discussion from a oncologic standpoint.  I think she should get a mammogram or at least attempt to  get a mammogram bilaterally.  If we cannot do this then we will need to get an MRI. The patient agrees with this plan. [place], DO

## 2018-06-16 ENCOUNTER — Other Ambulatory Visit: Payer: Self-pay | Admitting: Plastic Surgery

## 2018-06-16 DIAGNOSIS — Z9889 Other specified postprocedural states: Secondary | ICD-10-CM

## 2018-06-16 DIAGNOSIS — Z853 Personal history of malignant neoplasm of breast: Secondary | ICD-10-CM

## 2018-06-16 DIAGNOSIS — Z9882 Breast implant status: Secondary | ICD-10-CM

## 2018-06-30 ENCOUNTER — Ambulatory Visit
Admission: RE | Admit: 2018-06-30 | Discharge: 2018-06-30 | Disposition: A | Discharge status: Home / Self Care | Payer: Medicare Other | Source: Ambulatory Visit | Attending: Plastic Surgery | Admitting: Plastic Surgery

## 2018-06-30 ENCOUNTER — Ambulatory Visit: Payer: Medicare Other

## 2018-06-30 DIAGNOSIS — Z9889 Other specified postprocedural states: Secondary | ICD-10-CM

## 2018-06-30 DIAGNOSIS — Z853 Personal history of malignant neoplasm of breast: Secondary | ICD-10-CM

## 2018-06-30 DIAGNOSIS — Z9882 Breast implant status: Secondary | ICD-10-CM

## 2018-07-03 ENCOUNTER — Telehealth: Payer: Self-pay

## 2018-07-03 NOTE — Telephone Encounter (Signed)
Telephone call to pt to inform her of the results of her mammogram on 06/30/18. Findings were negative for malignancy bilaterally. Pt reports that the red & swollen area on her Left breast had resolved at the time of her mammo.  The Radiologist instructed pt to call Dr. Marla Roe if and when the symptoms reoccur to have Dr. Marla Roe  f/u with pt at that time. I instructed pt to call for any questions or concerns. Pt agrees with plan of care.  [employer]

## 2018-07-17 ENCOUNTER — Other Ambulatory Visit: Payer: Self-pay | Admitting: Pulmonary Disease

## 2018-08-04 ENCOUNTER — Ambulatory Visit (INDEPENDENT_AMBULATORY_CARE_PROVIDER_SITE_OTHER): Payer: Medicare Other | Admitting: Plastic Surgery

## 2018-08-04 ENCOUNTER — Encounter: Payer: Self-pay | Admitting: Plastic Surgery

## 2018-08-04 VITALS — BP 136/89 | HR 62 | Temp 99.0°F | Ht 61.5 in | Wt 252.0 lb

## 2018-08-04 DIAGNOSIS — L723 Sebaceous cyst: Secondary | ICD-10-CM | POA: Diagnosis not present

## 2018-08-04 DIAGNOSIS — L7211 Pilar cyst: Secondary | ICD-10-CM | POA: Insufficient documentation

## 2018-08-04 NOTE — Progress Notes (Signed)
   Subjective:    Patient ID: [name], female    DOB: [birth year], 78 y.o.   MRN: [id number]  The patient is a 78 yrs old wf here for evaluation of a skin lesion on her back.  It has been there for several weeks but is starting to get very tender irritated and even bleeding at times.  She was here several weeks ago for evaluation of her breast and that has resolved. There has been no further redness since her last visit.  Nothing makes it better it seems to be getting larger with time.  It is slightly red and tender.  It appears to be cystic in nature.  It is 1 cm in size.       Review of Systems  Constitutional: Negative.   HENT: Negative.   Eyes: Negative.   Respiratory: Negative.   Cardiovascular: Negative.   Gastrointestinal: Negative.   Endocrine: Negative.   Genitourinary: Negative.   Skin: Negative.  Negative for color change and wound.  Psychiatric/Behavioral: Negative.       Objective:   Physical Exam Musculoskeletal:       Arms:        Assessment & Plan:  Sebaceous cyst Plan for excision of posterior shoulder / back sebaceous cyst.

## 2018-08-04 NOTE — H&P (View-Only) (Signed)
   Subjective:    Patient ID: [name], female    DOB: [birth year], 78 y.o.   MRN: [id number]  The patient is a 78 yrs old wf here for evaluation of a skin lesion on her back.  It has been there for several weeks but is starting to get very tender irritated and even bleeding at times.  She was here several weeks ago for evaluation of her breast and that has resolved. There has been no further redness since her last visit.  Nothing makes it better it seems to be getting larger with time.  It is slightly red and tender.  It appears to be cystic in nature.  It is 1 cm in size.       Review of Systems  Constitutional: Negative.   HENT: Negative.   Eyes: Negative.   Respiratory: Negative.   Cardiovascular: Negative.   Gastrointestinal: Negative.   Endocrine: Negative.   Genitourinary: Negative.   Skin: Negative.  Negative for color change and wound.  Psychiatric/Behavioral: Negative.       Objective:   Physical Exam Musculoskeletal:       Arms:        Assessment & Plan:  Sebaceous cyst Plan for excision of posterior shoulder / back sebaceous cyst.

## 2018-08-22 ENCOUNTER — Other Ambulatory Visit: Payer: Self-pay

## 2018-08-22 ENCOUNTER — Encounter (HOSPITAL_BASED_OUTPATIENT_CLINIC_OR_DEPARTMENT_OTHER): Payer: Self-pay | Admitting: *Deleted

## 2018-08-25 ENCOUNTER — Encounter (HOSPITAL_BASED_OUTPATIENT_CLINIC_OR_DEPARTMENT_OTHER)
Admission: RE | Admit: 2018-08-25 | Discharge: 2018-08-25 | Disposition: A | Discharge status: Home / Self Care | Payer: Medicare Other | Source: Ambulatory Visit | Attending: Plastic Surgery | Admitting: Plastic Surgery

## 2018-08-25 DIAGNOSIS — Z01818 Encounter for other preprocedural examination: Secondary | ICD-10-CM | POA: Insufficient documentation

## 2018-08-25 LAB — BASIC METABOLIC PANEL
ANION GAP: 9 (ref 5–15)
BUN: 12 mg/dL (ref 8–23)
CHLORIDE: 105 mmol/L (ref 98–111)
CO2: 26 mmol/L (ref 22–32)
Calcium: 9.4 mg/dL (ref 8.9–10.3)
Creatinine, Ser: 0.78 mg/dL (ref 0.44–1.00)
GFR calc Af Amer: >60 mL/min (ref >60)
GFR calc non Af Amer: >60 mL/min (ref >60)
Glucose, Bld: 104 mg/dL — ABNORMAL HIGH (ref 70–99)
POTASSIUM: 3.4 mmol/L — AB (ref 3.5–5.1)
Sodium: 140 mmol/L (ref 135–145)

## 2018-08-25 NOTE — Progress Notes (Signed)
EKG reviewed by Dr. Ermalene Postin, will proceed with surgery as scheduled.

## 2018-08-25 NOTE — Progress Notes (Addendum)
Dr. Ermalene Postin examined pt's airway for BMI - Okay for surgery on Wednesday. Pt's BMI 47 .

## 2018-08-30 ENCOUNTER — Encounter (HOSPITAL_BASED_OUTPATIENT_CLINIC_OR_DEPARTMENT_OTHER)
Admission: RE | Disposition: A | Discharge status: Home / Self Care | Payer: Self-pay | Source: Home / Self Care | Attending: Plastic Surgery

## 2018-08-30 ENCOUNTER — Ambulatory Visit (HOSPITAL_BASED_OUTPATIENT_CLINIC_OR_DEPARTMENT_OTHER): Payer: Medicare Other | Admitting: Anesthesiology

## 2018-08-30 ENCOUNTER — Encounter (HOSPITAL_BASED_OUTPATIENT_CLINIC_OR_DEPARTMENT_OTHER): Payer: Self-pay | Admitting: Emergency Medicine

## 2018-08-30 ENCOUNTER — Other Ambulatory Visit: Payer: Self-pay

## 2018-08-30 ENCOUNTER — Ambulatory Visit (HOSPITAL_BASED_OUTPATIENT_CLINIC_OR_DEPARTMENT_OTHER)
Admission: RE | Admit: 2018-08-30 | Discharge: 2018-08-30 | Disposition: A | Discharge status: Home / Self Care | Payer: Medicare Other | Attending: Plastic Surgery | Admitting: Plastic Surgery

## 2018-08-30 DIAGNOSIS — E039 Hypothyroidism, unspecified: Secondary | ICD-10-CM | POA: Diagnosis not present

## 2018-08-30 DIAGNOSIS — L7211 Pilar cyst: Secondary | ICD-10-CM | POA: Insufficient documentation

## 2018-08-30 DIAGNOSIS — I10 Essential (primary) hypertension: Secondary | ICD-10-CM | POA: Insufficient documentation

## 2018-08-30 DIAGNOSIS — Z79890 Hormone replacement therapy (postmenopausal): Secondary | ICD-10-CM | POA: Diagnosis not present

## 2018-08-30 DIAGNOSIS — G4730 Sleep apnea, unspecified: Secondary | ICD-10-CM | POA: Diagnosis not present

## 2018-08-30 DIAGNOSIS — J45909 Unspecified asthma, uncomplicated: Secondary | ICD-10-CM | POA: Diagnosis not present

## 2018-08-30 DIAGNOSIS — Z6841 Body Mass Index (BMI) 40.0 and over, adult: Secondary | ICD-10-CM | POA: Insufficient documentation

## 2018-08-30 DIAGNOSIS — L988 Other specified disorders of the skin and subcutaneous tissue: Secondary | ICD-10-CM | POA: Diagnosis present

## 2018-08-30 DIAGNOSIS — Z79899 Other long term (current) drug therapy: Secondary | ICD-10-CM | POA: Insufficient documentation

## 2018-08-30 DIAGNOSIS — M1990 Unspecified osteoarthritis, unspecified site: Secondary | ICD-10-CM | POA: Diagnosis not present

## 2018-08-30 HISTORY — PX: CYST EXCISION: SHX5701

## 2018-08-30 SURGERY — CYST REMOVAL
Anesthesia: General | Site: Back

## 2018-08-30 MED ORDER — FENTANYL CITRATE (PF) 100 MCG/2ML IJ SOLN
INTRAMUSCULAR | Status: AC
  Filled 2018-08-30: qty 2

## 2018-08-30 MED ORDER — LIDOCAINE-EPINEPHRINE 1 %-1:100000 IJ SOLN
INTRAMUSCULAR | Status: DC | PRN
  Administered 2018-08-30: 5 mL

## 2018-08-30 MED ORDER — ONDANSETRON HCL 4 MG/2ML IJ SOLN: 4.0000 mg | Freq: Once | INTRAMUSCULAR | Status: DC | PRN

## 2018-08-30 MED ORDER — ACETAMINOPHEN 650 MG RE SUPP: 650.0000 mg | RECTAL | Status: DC | PRN

## 2018-08-30 MED ORDER — SODIUM CHLORIDE 0.9 % IV SOLN: 250.0000 mL | INTRAVENOUS | Status: DC | PRN

## 2018-08-30 MED ORDER — SODIUM CHLORIDE 0.9% FLUSH: 3.0000 mL | Freq: Two times a day (BID) | INTRAVENOUS | Status: DC

## 2018-08-30 MED ORDER — PHENYLEPHRINE 40 MCG/ML (10ML) SYRINGE FOR IV PUSH (FOR BLOOD PRESSURE SUPPORT)
PREFILLED_SYRINGE | INTRAVENOUS | Status: AC
  Filled 2018-08-30: qty 10

## 2018-08-30 MED ORDER — SCOPOLAMINE 1 MG/3DAYS TD PT72: 1.0000 | MEDICATED_PATCH | Freq: Once | TRANSDERMAL | Status: DC | PRN

## 2018-08-30 MED ORDER — PROPOFOL 500 MG/50ML IV EMUL
INTRAVENOUS | Status: AC
  Filled 2018-08-30: qty 50

## 2018-08-30 MED ORDER — ONDANSETRON HCL 4 MG/2ML IJ SOLN
INTRAMUSCULAR | Status: DC | PRN
  Administered 2018-08-30: 4 mg via INTRAVENOUS

## 2018-08-30 MED ORDER — SUCCINYLCHOLINE CHLORIDE 200 MG/10ML IV SOSY
PREFILLED_SYRINGE | INTRAVENOUS | Status: AC
  Filled 2018-08-30: qty 10

## 2018-08-30 MED ORDER — FENTANYL CITRATE (PF) 100 MCG/2ML IJ SOLN: 25.0000 ug | INTRAMUSCULAR | Status: DC | PRN

## 2018-08-30 MED ORDER — EPHEDRINE 5 MG/ML INJ
INTRAVENOUS | Status: AC
  Filled 2018-08-30: qty 10

## 2018-08-30 MED ORDER — ONDANSETRON HCL 4 MG/2ML IJ SOLN
INTRAMUSCULAR | Status: AC
  Filled 2018-08-30: qty 2

## 2018-08-30 MED ORDER — CEFAZOLIN SODIUM-DEXTROSE 2-4 GM/100ML-% IV SOLN
INTRAVENOUS | Status: AC
  Filled 2018-08-30: qty 100

## 2018-08-30 MED ORDER — ACETAMINOPHEN 500 MG PO TABS
ORAL_TABLET | ORAL | Status: AC
  Filled 2018-08-30: qty 2

## 2018-08-30 MED ORDER — ACETAMINOPHEN 500 MG PO TABS
1000.0000 mg | ORAL_TABLET | Freq: Once | ORAL | Status: AC
  Administered 2018-08-30: 1000 mg via ORAL

## 2018-08-30 MED ORDER — ACETAMINOPHEN 325 MG PO TABS: 650.0000 mg | ORAL_TABLET | ORAL | Status: DC | PRN

## 2018-08-30 MED ORDER — SODIUM CHLORIDE 0.9% FLUSH: 3.0000 mL | INTRAVENOUS | Status: DC | PRN

## 2018-08-30 MED ORDER — LIDOCAINE 2% (20 MG/ML) 5 ML SYRINGE
INTRAMUSCULAR | Status: AC
  Filled 2018-08-30: qty 5

## 2018-08-30 MED ORDER — LACTATED RINGERS IV SOLN
INTRAVENOUS | Status: DC
  Administered 2018-08-30 (×2): via INTRAVENOUS

## 2018-08-30 MED ORDER — FENTANYL CITRATE (PF) 100 MCG/2ML IJ SOLN: 50.0000 ug | INTRAMUSCULAR | Status: DC | PRN

## 2018-08-30 MED ORDER — CEFAZOLIN SODIUM-DEXTROSE 2-4 GM/100ML-% IV SOLN
2.0000 g | INTRAVENOUS | Status: AC
  Administered 2018-08-30: 2 g via INTRAVENOUS

## 2018-08-30 MED ORDER — MIDAZOLAM HCL 2 MG/2ML IJ SOLN
INTRAMUSCULAR | Status: AC
  Filled 2018-08-30: qty 2

## 2018-08-30 MED ORDER — MIDAZOLAM HCL 2 MG/2ML IJ SOLN: 1.0000 mg | INTRAMUSCULAR | Status: DC | PRN

## 2018-08-30 MED ORDER — OXYCODONE HCL 5 MG PO TABS: 5.0000 mg | ORAL_TABLET | ORAL | Status: DC | PRN

## 2018-08-30 MED ORDER — PROPOFOL 10 MG/ML IV BOLUS
INTRAVENOUS | Status: DC | PRN
  Administered 2018-08-30 (×2): 20 mg via INTRAVENOUS

## 2018-08-30 SURGICAL SUPPLY — 57 items
BLADE CLIPPER SURG (BLADE) IMPLANT
BLADE SURG 15 STRL LF DISP TIS (BLADE) ×1 IMPLANT
BLADE SURG 15 STRL SS (BLADE) ×2
CANISTER SUCT 1200ML W/VALVE (MISCELLANEOUS) IMPLANT
CHLORAPREP W/TINT 26ML (MISCELLANEOUS) ×3 IMPLANT
CLOSURE WOUND 1/2 X4 (GAUZE/BANDAGES/DRESSINGS)
CORD BIPOLAR FORCEPS 12FT (ELECTRODE) IMPLANT
COVER BACK TABLE 60X90IN (DRAPES) ×3 IMPLANT
COVER MAYO STAND STRL (DRAPES) ×3 IMPLANT
COVER WAND RF STERILE (DRAPES) IMPLANT
DERMABOND ADVANCED (GAUZE/BANDAGES/DRESSINGS) ×2
DERMABOND ADVANCED .7 DNX12 (GAUZE/BANDAGES/DRESSINGS) ×1 IMPLANT
DRAPE U-SHAPE 76X120 STRL (DRAPES) ×3 IMPLANT
DRSG TEGADERM 2-3/8X2-3/4 SM (GAUZE/BANDAGES/DRESSINGS) IMPLANT
ELECT COATED BLADE 2.86 ST (ELECTRODE) ×3 IMPLANT
ELECT NEEDLE BLADE 2-5/6 (NEEDLE) ×3 IMPLANT
ELECT REM PT RETURN 9FT ADLT (ELECTROSURGICAL) ×3
ELECT REM PT RETURN 9FT PED (ELECTROSURGICAL)
ELECTRODE REM PT RETRN 9FT PED (ELECTROSURGICAL) IMPLANT
ELECTRODE REM PT RTRN 9FT ADLT (ELECTROSURGICAL) ×1 IMPLANT
GAUZE SPONGE 4X4 12PLY STRL LF (GAUZE/BANDAGES/DRESSINGS) IMPLANT
GLOVE BIO SURGEON STRL SZ 6.5 (GLOVE) ×2 IMPLANT
GLOVE BIO SURGEONS STRL SZ 6.5 (GLOVE) ×1
GLOVE BIOGEL PI IND STRL 8 (GLOVE) ×1 IMPLANT
GLOVE BIOGEL PI INDICATOR 8 (GLOVE) ×2
GLOVE SURG SYN 8.0 (GLOVE) ×3 IMPLANT
GOWN STRL REUS W/ TWL LRG LVL3 (GOWN DISPOSABLE) ×2 IMPLANT
GOWN STRL REUS W/TWL LRG LVL3 (GOWN DISPOSABLE) ×4
NEEDLE HYPO 30GX1 BEV (NEEDLE) IMPLANT
NEEDLE PRECISIONGLIDE 27X1.5 (NEEDLE) ×3 IMPLANT
NS IRRIG 1000ML POUR BTL (IV SOLUTION) IMPLANT
PACK BASIN DAY SURGERY FS (CUSTOM PROCEDURE TRAY) ×3 IMPLANT
PENCIL BUTTON HOLSTER BLD 10FT (ELECTRODE) ×3 IMPLANT
RUBBERBAND STERILE (MISCELLANEOUS) IMPLANT
SHEET MEDIUM DRAPE 40X70 STRL (DRAPES) IMPLANT
SPONGE GAUZE 2X2 8PLY STER LF (GAUZE/BANDAGES/DRESSINGS)
SPONGE GAUZE 2X2 8PLY STRL LF (GAUZE/BANDAGES/DRESSINGS) IMPLANT
STRIP CLOSURE SKIN 1/2X4 (GAUZE/BANDAGES/DRESSINGS) IMPLANT
STRIP SUTURE WOUND CLOSURE 1/2 (SUTURE) IMPLANT
SUCTION FRAZIER HANDLE 10FR (MISCELLANEOUS)
SUCTION TUBE FRAZIER 10FR DISP (MISCELLANEOUS) IMPLANT
SUT ETHILON 5 0 P 3 18 (SUTURE)
SUT MNCRL 6-0 UNDY P1 1X18 (SUTURE) ×1 IMPLANT
SUT MNCRL AB 4-0 PS2 18 (SUTURE) IMPLANT
SUT MON AB 5-0 P3 18 (SUTURE) ×3 IMPLANT
SUT MONOCRYL 6-0 P1 1X18 (SUTURE) ×2
SUT NYLON ETHILON 5-0 P-3 1X18 (SUTURE) IMPLANT
SUT PLAIN 5 0 P 3 18 (SUTURE) IMPLANT
SUT VIC AB 5-0 P-3 18X BRD (SUTURE) IMPLANT
SUT VIC AB 5-0 P3 18 (SUTURE)
SUT VICRYL 4-0 PS2 18IN ABS (SUTURE) IMPLANT
SYR BULB 3OZ (MISCELLANEOUS) IMPLANT
SYR CONTROL 10ML LL (SYRINGE) ×3 IMPLANT
TOWEL GREEN STERILE FF (TOWEL DISPOSABLE) ×3 IMPLANT
TRAY DSU PREP LF (CUSTOM PROCEDURE TRAY) ×3 IMPLANT
TUBE CONNECTING 20'X1/4 (TUBING)
TUBE CONNECTING 20X1/4 (TUBING) IMPLANT

## 2018-08-30 NOTE — Discharge Instructions (Addendum)
May shower tomorrow. No heavy lifting.   Post Anesthesia Home Care Instructions  Activity: Get plenty of rest for the remainder of the day. A responsible individual must stay with you for 24 hours following the procedure.  For the next 24 hours, DO NOT: -Drive a car -Paediatric nurse -Drink alcoholic beverages -Take any medication unless instructed by your physician -Make any legal decisions or sign important papers.  Meals: Start with liquid foods such as gelatin or soup. Progress to regular foods as tolerated. Avoid greasy, spicy, heavy foods. If nausea and/or vomiting occur, drink only clear liquids until the nausea and/or vomiting subsides. Call your physician if vomiting continues.  Special Instructions/Symptoms: Your throat may feel dry or sore from the anesthesia or the breathing tube placed in your throat during surgery. If this causes discomfort, gargle with warm salt water. The discomfort should disappear within 24 hours.  If you had a scopolamine patch placed behind your ear for the management of post- operative nausea and/or vomiting:  1. The medication in the patch is effective for 72 hours, after which it should be removed.  Wrap patch in a tissue and discard in the trash. Wash hands thoroughly with soap and water. 2. You may remove the patch earlier than 72 hours if you experience unpleasant side effects which may include dry mouth, dizziness or visual disturbances. 3. Avoid touching the patch. Wash your hands with soap and water after contact with the patch.

## 2018-08-30 NOTE — Op Note (Signed)
DATE OF OPERATION: 08/30/2018  LOCATION: [facility]  PREOPERATIVE DIAGNOSIS: right back skin lesion / cyst  POSTOPERATIVE DIAGNOSIS: Same  PROCEDURE: excision of right back skin lesion / cyst 1.5 cm  SURGEON: Mattison Stuckey Sanger Taunya Goral, DO  EBL: 1 cc  CONDITION: Stable  COMPLICATIONS: None  INDICATION: The patient, [name], is a 78 y.o. female born on [birth year], is here for treatment of a persistent skin lesion of the right mid back area.   PROCEDURE DETAILS:  The patient was seen prior to surgery and marked.  The IV antibiotics were given. The patient was taken to the operating room and given anesthetic. A standard time out was performed and all information was confirmed by those in the room. SCDs were placed.   The patient was placed in the left lateral position.  The back was prepped and draped.  The area was marked with a pen.  The local was then injected for intraoperative hemostasis and postoperative pain control.  The #15 blade was used to make an elliptical incision around the lesion.  The entire 1.5 cm lesion was removed.  The bovie was used for hemostasis.  The 5-0 Monocryl was used to close the deep layer followed by the 5-0 Monocryl for the skin.  This was closed with a running subcuticular closure with 1 interrupted stitch.  Specimen was sent to path. The patient was allowed to wake up and taken to recovery room in stable condition at the end of the case. The family was notified at the end of the case.

## 2018-08-30 NOTE — Interval H&P Note (Signed)
History and Physical Interval Note:  08/30/2018 10:24 AM  [name]  has presented today for surgery, with the diagnosis of sebaceuos cyst  The various methods of treatment have been discussed with the patient and family. After consideration of risks, benefits and other options for treatment, the patient has consented to  Procedure(s): excision of posterior shoulder / back sebaceous cyst (N/A) as a surgical intervention .  The patient's history has been reviewed, patient examined, no change in status, stable for surgery.  I have reviewed the patient's chart and labs.  Questions were answered to the patient's satisfaction.     Loel Lofty Fizza Scales

## 2018-08-30 NOTE — Anesthesia Postprocedure Evaluation (Signed)
Anesthesia Post Note  Patient: [name]  Procedure(s) Performed: excision of posterior shoulder / back sebaceous cyst (N/A Back)     Patient location during evaluation: PACU Anesthesia Type: General Level of consciousness: awake and alert Pain management: pain level controlled Vital Signs Assessment: post-procedure vital signs reviewed and stable Respiratory status: spontaneous breathing, nonlabored ventilation, respiratory function stable and patient connected to nasal cannula oxygen Cardiovascular status: blood pressure returned to baseline and stable Postop Assessment: no apparent nausea or vomiting Anesthetic complications: no    Last Vitals:  Vitals:   08/30/18 1130 08/30/18 1145  BP: (!) 129/55 (!) 155/67  Pulse: (!) 56 (!) 57  Resp: (!) 23 18  Temp:  36.7 C  SpO2: 96% 96%    Last Pain:  Vitals:   08/30/18 1145  TempSrc:   PainSc: 0-No pain                 Tiajuana Amass

## 2018-08-30 NOTE — Transfer of Care (Signed)
Immediate Anesthesia Transfer of Care Note  Patient: [name]  Procedure(s) Performed: excision of posterior shoulder / back sebaceous cyst (N/A Back)  Patient Location: PACU  Anesthesia Type:MAC  Level of Consciousness: awake, alert  and oriented  Airway & Oxygen Therapy: Patient Spontanous Breathing and Patient connected to nasal cannula oxygen  Post-op Assessment: Report given to RN  Post vital signs: Reviewed and stable  Last Vitals:  Vitals Value Taken Time  BP    Temp    Pulse 56 08/30/2018 11:10 AM  Resp 18 08/30/2018 11:10 AM  SpO2 98 % 08/30/2018 11:10 AM  Vitals shown include unvalidated device data.  Last Pain:  Vitals:   08/30/18 0858  TempSrc: Oral  PainSc: 0-No pain         Complications: No apparent anesthesia complications

## 2018-08-30 NOTE — Anesthesia Preprocedure Evaluation (Addendum)
Anesthesia Evaluation  Patient identified by MRN, date of birth, ID band Patient awake    Reviewed: Allergy & Precautions, NPO status , Patient's Chart, lab work & pertinent test results  Airway Mallampati: II  TM Distance: >3 FB Neck ROM: Full    Dental  (+) Dental Advisory Given   Pulmonary asthma , sleep apnea ,    breath sounds clear to auscultation       Cardiovascular hypertension, Pt. on medications + DOE   Rhythm:Regular Rate:Normal     Neuro/Psych negative neurological ROS  negative psych ROS   GI/Hepatic negative GI ROS, Neg liver ROS,   Endo/Other  Hypothyroidism Morbid obesity  Renal/GU negative Renal ROS  negative genitourinary   Musculoskeletal  (+) Arthritis ,   Abdominal   Peds negative pediatric ROS (+)  Hematology negative hematology ROS (+) anemia ,   Anesthesia Other Findings   Reproductive/Obstetrics negative OB ROS                             Anesthesia Physical Anesthesia Plan  ASA: III  Anesthesia Plan: MAC   Post-op Pain Management:    Induction: Intravenous  PONV Risk Score and Plan: 3 and Dexamethasone, Ondansetron, Treatment may vary due to age or medical condition and Propofol infusion  Airway Management Planned: Natural Airway and Simple Face Mask  Additional Equipment:   Intra-op Plan:   Post-operative Plan:   Informed Consent: I have reviewed the patients History and Physical, chart, labs and discussed the procedure including the risks, benefits and alternatives for the proposed anesthesia with the patient or authorized representative who has indicated his/her understanding and acceptance.     Dental advisory given  Plan Discussed with: CRNA  Anesthesia Plan Comments:        Anesthesia Quick Evaluation

## 2018-08-31 ENCOUNTER — Encounter (HOSPITAL_BASED_OUTPATIENT_CLINIC_OR_DEPARTMENT_OTHER): Payer: Self-pay | Admitting: Plastic Surgery

## 2018-09-01 ENCOUNTER — Telehealth: Payer: Self-pay | Admitting: Plastic Surgery

## 2018-09-01 NOTE — Telephone Encounter (Signed)
Patient called and left voicemail expressing concerns about her incision from her operation on Wednesday. Patient requesting call back for answers to a few questions.

## 2018-09-05 ENCOUNTER — Telehealth: Payer: Self-pay

## 2018-09-05 NOTE — Telephone Encounter (Signed)
Call back to pt - to discuss her c/o of itching & increased redness at incision site  I instructed her to try OTC antihistamine- Allegra & Benadryl at night She asked if she can use Mupirocin ointment- I advised her to not introduce this to incision   She was going to try the antihistamine & will let us know if she needs further care- she has a follow-up appointment on 09/08/18  [place], RNFA

## 2018-09-08 ENCOUNTER — Ambulatory Visit (INDEPENDENT_AMBULATORY_CARE_PROVIDER_SITE_OTHER): Payer: Medicare Other | Admitting: Plastic Surgery

## 2018-09-08 ENCOUNTER — Encounter: Payer: Self-pay | Admitting: Plastic Surgery

## 2018-09-08 VITALS — BP 156/69 | HR 68 | Temp 98.0°F | Ht 61.5 in | Wt 262.2 lb

## 2018-09-08 DIAGNOSIS — L7211 Pilar cyst: Secondary | ICD-10-CM

## 2018-09-08 NOTE — Progress Notes (Signed)
   Subjective:    Patient ID: [name], female    DOB: [birth year], 78 y.o.   MRN: [id number]  The patient is a 78 yrs old wf here for follow up on her back skin excision.  Pathology showed a pilar cyst.  The area is healing well.  No sign of infection.  Incision healing.  Slight periwound irritation from the tape.  Patient is very pleased.     Review of Systems  Constitutional: Negative.   HENT: Negative.   Eyes: Negative.   Respiratory: Negative.   Cardiovascular: Negative.   Gastrointestinal: Negative.   Genitourinary: Negative.        Objective:   Physical Exam Vitals signs and nursing note reviewed.  Constitutional:      Appearance: Normal appearance.  HENT:     Head: Normocephalic and atraumatic.  Cardiovascular:     Rate and Rhythm: Normal rate.  Neurological:     Mental Status: She is alert.  Psychiatric:        Mood and Affect: Mood normal.        Thought Content: Thought content normal.        Judgment: Judgment normal.       Assessment & Plan:  Pilar cyst  Picture placed in Epic Sutures removed. Keep steri strip in place for one week and then may remove.   May shower.

## 2018-10-12 ENCOUNTER — Other Ambulatory Visit: Payer: Self-pay | Admitting: Pulmonary Disease

## 2018-10-12 DIAGNOSIS — J4530 Mild persistent asthma, uncomplicated: Secondary | ICD-10-CM

## 2018-11-23 NOTE — Progress Notes (Signed)
Primary Physician:  Chesley Noon, MD   Patient ID: [name], female    DOB: 09-15-1940, 78 y.o.   MRN: [id number]  Subjective:    Chief Complaint  Patient presents with  . Hypertension  . New Patient (Initial Visit)    HPI: [name]  is a 78 y.o. female  with hypothyroidism, hypertension, depression/anxiety, OSA on CPAP, history of left breast cancer, hypocalcemia, CKD stage 3, referred to [place] for management of hypertension by PCP, Mattie Marlin, PA.  Patient has recently had difficulty with controlling her blood pressure despite addition of metoprolol and hydralazine. Borderline bradycardia has limited further increasing Metoprolol.   Patient reports that she has had hypertension for several years; however, recently developed dizziness which prompted her to start checking her blood pressure regularly and she noted elevated blood pressures.  She particularly noted dizziness with going from lying to sitting in the bed, leaning her head back, or bending to the side.  Symptoms were felt to be related to vertigo and she was started on Antivert.  She is now taking Antivert at night and it has helped with her dizziness.  She has not had any syncope.  She was referred to neurology; however, canceled her appointment as her symptoms have improved.  In regards to her blood pressure, she has been monitoring 3 times a day and generally at home runs in the 350-093 range systolic.  She has just started 3 times a day dosing of hydralazine.  She denies any chest pain.  Has chronic shortness of breath and dyspnea on exertion that she attributes to asthma.  Has noticed slight worsening in her shortness of breath recently.  Also has chronic leg edema and states has worsened since she has not been as active due to her dizziness.  Weight has been stable.    Past Medical History:  Diagnosis Date  . Anemia    PMH  . Anxiety   . Asthma   . Breast cancer ([place]) 1988   left breast cancer  .  Collagenous colitis   . Compression fracture    lumbar 1  . Hypertension   . Hypothyroidism   . Osteoarthritis   . Osteopenia   . PONV (postoperative nausea and vomiting)   . Sleep apnea    wears CPAP nightly    Past Surgical History:  Procedure Laterality Date  . BREAST REDUCTION SURGERY     Right  . CATARACT EXTRACTION W/ INTRAOCULAR LENS  IMPLANT, BILATERAL    . CESAREAN SECTION    . COLONOSCOPY W/ BIOPSIES AND POLYPECTOMY    . CYST EXCISION N/A 08/30/2018   Procedure: excision of posterior shoulder / back sebaceous cyst;  Surgeon: Wallace Going, DO;  Location: [place];  Service: Plastics;  Laterality: N/A;  . KYPHOPLASTY N/A 02/18/2016   Procedure: LUMBAR 1 KYPHOPLASTY;  Surgeon: Phylliss Bob, MD;  Location: [place];  Service: Orthopedics;  Laterality: N/A;  LUMBAR 1 KYPHOPLASTY  . MODIFIED RADICAL MASTECTOMY W/ AXILLARY LYMPH NODE DISSECTION     Left  . RECONSTRUCTION BREAST W/ LATISSIMUS DORSI FLAP     left  . right foot surgery     corrected hammer toe, straightened big toe  . SEPTOPLASTY    . TOE SURGERY    . TONSILLECTOMY AND ADENOIDECTOMY      Social History   Socioeconomic History  . Marital status: Married    Spouse name: Not on file  . Number of children: Not  on file  . Years of education: Not on file  . Highest education level: Not on file  Occupational History  . Occupation: retired  Scientific laboratory technician  . Financial resource strain: Not on file  . Food insecurity:    Worry: Not on file    Inability: Not on file  . Transportation needs:    Medical: Not on file    Non-medical: Not on file  Tobacco Use  . Smoking status: Never Smoker  . Smokeless tobacco: Never Used  Substance and Sexual Activity  . Alcohol use: Yes    Comment: social  . Drug use: No  . Sexual activity: Not on file  Lifestyle  . Physical activity:    Days per week: Not on file    Minutes per session: Not on file  . Stress: Not on file  Relationships  .  Social connections:    Talks on phone: Not on file    Gets together: Not on file    Attends religious service: Not on file    Active member of club or organization: Not on file    Attends meetings of clubs or organizations: Not on file    Relationship status: Not on file  . Intimate partner violence:    Fear of current or ex partner: Not on file    Emotionally abused: Not on file    Physically abused: Not on file    Forced sexual activity: Not on file  Other Topics Concern  . Not on file  Social History Narrative  . Not on file    Review of Systems  Constitution: Negative for decreased appetite, malaise/fatigue, weight gain and weight loss.  Eyes: Negative for visual disturbance.  Cardiovascular: Positive for leg swelling. Negative for chest pain, claudication, dyspnea on exertion, orthopnea, palpitations and syncope.  Respiratory: Positive for shortness of breath. Negative for hemoptysis and wheezing.   Endocrine: Negative for cold intolerance and heat intolerance.  Hematologic/Lymphatic: Negative for bleeding problem. Does not bruise/bleed easily.  Skin: Negative for nail changes.  Musculoskeletal: Negative for muscle weakness and myalgias.  Gastrointestinal: Negative for abdominal pain, change in bowel habit, nausea and vomiting.  Neurological: Positive for dizziness. Negative for difficulty with concentration, focal weakness and headaches.  Psychiatric/Behavioral: Negative for altered mental status and suicidal ideas.  All other systems reviewed and are negative.     Objective:  Blood pressure 131/63, pulse 64, temperature 98.6 F (37 C), height 5\' 1"  (1.549 m), weight 258 lb (117 kg), SpO2 96 %. Body mass index is 48.75 kg/m.    Physical Exam  Constitutional: She is oriented to person, place, and time. Vital signs are normal. She appears well-developed and well-nourished.  obese  HENT:  Head: Normocephalic and atraumatic.  Neck: Normal range of motion.   Cardiovascular: Normal rate, regular rhythm and intact distal pulses.  Murmur heard.  Early systolic murmur is present with a grade of 1/6 at the upper right sternal border. Pulses:      Dorsalis pedis pulses are 2+ on the right side and 2+ on the left side.       Posterior tibial pulses are 2+ on the right side and 2+ on the left side.  3+ pitting edema bilateral  Pulmonary/Chest: Effort normal. No accessory muscle usage. No respiratory distress. She has no wheezes.  Abdominal: Soft. Bowel sounds are normal.  Musculoskeletal: Normal range of motion.  Neurological: She is alert and oriented to person, place, and time.  Skin: Skin is warm and  dry.  Vitals reviewed.  Radiology: No results found.  Laboratory examination:    CMP Latest Ref Rng & Units 08/25/2018 02/18/2016 12/17/2014  Glucose 70 - 99 mg/dL 104(H) 103(H) 103  BUN 8 - 23 mg/dL 12 9 16.9  Creatinine 0.44 - 1.00 mg/dL 0.78 0.63 0.9  Sodium 135 - 145 mmol/L 140 139 143  Potassium 3.5 - 5.1 mmol/L 3.4(L) 3.3(L) 4.0  Chloride 98 - 111 mmol/L 105 107 -  CO2 22 - 32 mmol/L 26 24 25   Calcium 8.9 - 10.3 mg/dL 9.4 9.7 9.8  Total Protein 6.5 - 8.1 g/dL - 6.6 6.9  Total Bilirubin 0.3 - 1.2 mg/dL - 0.7 0.46  Alkaline Phos 38 - 126 U/L - 101 107  AST 15 - 41 U/L - 34 25  ALT 14 - 54 U/L - 42 26   CBC Latest Ref Rng & Units 02/18/2016 12/17/2014 12/28/2013  WBC 4.0 - 10.5 K/uL 5.9 5.7 5.4  Hemoglobin 12.0 - 15.0 g/dL 13.8 14.0 13.5  Hematocrit 36.0 - 46.0 % 42.5 41.6 41.2  Platelets 150 - 400 K/uL 168 185 169   Lipid Panel  No results found for: CHOL, TRIG, HDL, CHOLHDL, VLDL, LDLCALC, LDLDIRECT HEMOGLOBIN A1C No results found for: HGBA1C, MPG TSH No results for input(s): TSH in the last 8760 hours.  PRN Meds:. Medications Discontinued During This Encounter  Medication Reason  . potassium chloride SA (K-DUR,KLOR-CON) 20 MEQ tablet Discontinued by provider   Current Meds  Medication Sig  . albuterol (PROVENTIL) (2.5  MG/3ML) 0.083% nebulizer solution Take 3 mLs (2.5 mg dose) by nebulization every 6 (six) hours as needed for Wheezing.  Marland Kitchen azelastine (ASTELIN) 0.1 % nasal spray Place 1 spray into both nostrils daily as needed for allergies.  . Azelastine HCl 0.15 % SOLN USE ONE SPRAY NASALLY DAILY  . Calcium Citrate-Vitamin D (CITRACAL + D PO) Take 2 tablets by mouth daily.  . clobetasol cream (TEMOVATE) 6.30 % Apply 1 application topically daily as needed (irritation).  Marland Kitchen dextromethorphan-guaiFENesin (MUCINEX DM) 30-600 MG per 12 hr tablet Take 1 tablet by mouth every 12 (twelve) hours.  . diclofenac sodium (VOLTAREN) 1 % GEL as needed.  . diltiazem (CARDIZEM CD) 240 MG 24 hr capsule Take 240 mg by mouth daily.  . diphenhydrAMINE (BENADRYL) 25 MG tablet Take 50 mg by mouth 4 (four) times daily.  Marland Kitchen FLOVENT HFA 110 MCG/ACT inhaler USE 2 INHALATIONS DAILY  . furosemide (LASIX) 20 MG tablet Take 20 mg by mouth daily.   . furosemide (LASIX) 40 MG tablet Take 1 tablet by mouth daily.  Marland Kitchen glucosamine-chondroitin 500-400 MG tablet Take 1 tablet by mouth 2 (two) times daily.  . hydrALAZINE (APRESOLINE) 25 MG tablet Take 25 mg by mouth 3 (three) times daily.   Marland Kitchen levothyroxine (SYNTHROID, LEVOTHROID) 88 MCG tablet Take 88 mcg by mouth daily.  Marland Kitchen losartan (COZAAR) 100 MG tablet Take 100 mg by mouth daily.  . meclizine (ANTIVERT) 25 MG tablet Take 25 mg by mouth 3 (three) times daily as needed for dizziness.  . metoprolol succinate (TOPROL-XL) 50 MG 24 hr tablet TAKE ONE TABLET BY MOUTH DAILY  . Multiple Vitamins-Minerals (MULTIVITAMIN WITH MINERALS) tablet Take 1 tablet by mouth daily.  . naproxen sodium (ALEVE) 220 MG tablet Take 220 mg by mouth daily as needed.  . NON FORMULARY Mega b stress once a day  . Polyethyl Glycol-Propyl Glycol 0.4-0.3 % SOLN 1 drop as needed.  Marland Kitchen PROAIR HFA 108 (90 Base) MCG/ACT inhaler USE  2 INHALATIONS EVERY 6 HOURS AS NEEDED FOR WHEEZING OR SHORTNESS OF BREATH  . sertraline (ZOLOFT) 100 MG  tablet Take 100 mg by mouth 2 (two) times daily.   Marland Kitchen zolpidem (AMBIEN) 10 MG tablet Take 10 mg by mouth at bedtime as needed for sleep.   . [DISCONTINUED] potassium chloride SA (K-DUR,KLOR-CON) 20 MEQ tablet Take 20 mEq by mouth daily.    Cardiac Studies:     Assessment:   Essential hypertension - Plan: EKG 12-Lead, PCV ECHOCARDIOGRAM COMPLETE, Basic Metabolic Panel (BMET)  Chronic diastolic heart failure (HCC) - Plan: PCV ECHOCARDIOGRAM COMPLETE  Leg edema - Plan: PCV ECHOCARDIOGRAM COMPLETE  Dizziness  Shortness of breath  Severe obesity (BMI >= 40) (HCC)  EKG 11/24/2018: Sinus rhythm/sinus arrhythmia at 64 bpm, normal axis, no evidence of ischemia.   Recommendations:   Patient's blood pressure has been difficult to control despite addition of metoprolol and hydralazine, although her blood pressure was stable in our office, generally much more elevated at home.  I suspect patient has diastolic dysfunction secondary to hypertension and obesity.  Leg edema in shortness of breath likely related to this. She would benefit from addition of Aldactone both for her blood pressure and diastolic dysfunction.  Potassium previously was also borderline low.  I will start her on this.  Advised her to stop potassium supplement.  Will check BMP in 10 days for surveillance.  Encouraged her to continue to monitor her blood pressure regularly at home and if she begins noticing blood pressure is improving, can stop hydralazine.  I have recommended echocardiogram for further evaluation.  Has soft systolic murmur on exam.  She denies any chest discomfort, will hold off on stress testing at this point, but may consider depending upon symptoms and echocardiogram findings.    I suspect her dizziness may also have been related to her blood pressure, but has been stable for the last 1 week.  No syncope.   Her weight is also a issue that is likely contributing to her symptoms as well.  She was counseled on the  importance of diet modifications to promote weight loss.  Discussed low-sodium diet.  I will see her back after her tests for follow-up and reevaluation.  Encouraged her to contact me for any concerns or worsening problems.   *I have discussed this case with Dr. Virgina Jock and he personally examined the patient and participated in formulating the plan.*    Miquel Dunn, MSN, APRN, FNP-C [facility]. [place] Office: [phone] Fax: [phone]

## 2018-11-24 ENCOUNTER — Ambulatory Visit (INDEPENDENT_AMBULATORY_CARE_PROVIDER_SITE_OTHER): Payer: Medicare Other | Admitting: Cardiology

## 2018-11-24 ENCOUNTER — Encounter: Payer: Self-pay | Admitting: Cardiology

## 2018-11-24 ENCOUNTER — Other Ambulatory Visit: Payer: Self-pay

## 2018-11-24 VITALS — BP 131/63 | HR 64 | Temp 98.6°F | Ht 61.0 in | Wt 258.0 lb

## 2018-11-24 DIAGNOSIS — R42 Dizziness and giddiness: Secondary | ICD-10-CM | POA: Diagnosis not present

## 2018-11-24 DIAGNOSIS — I10 Essential (primary) hypertension: Secondary | ICD-10-CM

## 2018-11-24 DIAGNOSIS — I5032 Chronic diastolic (congestive) heart failure: Secondary | ICD-10-CM

## 2018-11-24 DIAGNOSIS — R0602 Shortness of breath: Secondary | ICD-10-CM

## 2018-11-24 DIAGNOSIS — R600 Localized edema: Secondary | ICD-10-CM | POA: Diagnosis not present

## 2018-11-24 MED ORDER — SPIRONOLACTONE 25 MG PO TABS: 25.0000 mg | ORAL_TABLET | Freq: Every day | ORAL | 1 refills | Status: DC

## 2018-11-24 NOTE — Patient Instructions (Signed)
Continue to monitor BP regularly. IF BP is well controlled, can stop hydralazine.  Blood work in 2 weeks to follow up on kidney function

## 2018-11-25 ENCOUNTER — Encounter: Payer: Self-pay | Admitting: Cardiology

## 2018-12-08 ENCOUNTER — Other Ambulatory Visit: Payer: Self-pay | Admitting: Cardiology

## 2018-12-09 LAB — BASIC METABOLIC PANEL
BUN/Creatinine Ratio: 19 (ref 12–28)
BUN: 19 mg/dL (ref 8–27)
CO2: 22 mmol/L (ref 20–29)
Calcium: 9.7 mg/dL (ref 8.7–10.3)
Chloride: 103 mmol/L (ref 96–106)
Creatinine, Ser: 1.01 mg/dL — ABNORMAL HIGH (ref 0.57–1.00)
GFR calc Af Amer: 63 mL/min/{1.73_m2} (ref >59)
GFR calc non Af Amer: 55 mL/min/{1.73_m2} — ABNORMAL LOW (ref >59)
Glucose: 113 mg/dL — ABNORMAL HIGH (ref 65–99)
Potassium: 3.7 mmol/L (ref 3.5–5.2)
Sodium: 142 mmol/L (ref 134–144)

## 2018-12-26 ENCOUNTER — Ambulatory Visit (INDEPENDENT_AMBULATORY_CARE_PROVIDER_SITE_OTHER): Payer: Medicare Other | Admitting: Cardiology

## 2018-12-26 ENCOUNTER — Ambulatory Visit: Payer: Medicare Other | Admitting: Cardiology

## 2018-12-26 ENCOUNTER — Ambulatory Visit (INDEPENDENT_AMBULATORY_CARE_PROVIDER_SITE_OTHER): Payer: Medicare Other

## 2018-12-26 ENCOUNTER — Encounter: Payer: Self-pay | Admitting: Cardiology

## 2018-12-26 ENCOUNTER — Other Ambulatory Visit: Payer: Self-pay

## 2018-12-26 VITALS — BP 145/78 | HR 77 | Ht 63.0 in | Wt 263.0 lb

## 2018-12-26 DIAGNOSIS — I5032 Chronic diastolic (congestive) heart failure: Secondary | ICD-10-CM

## 2018-12-26 DIAGNOSIS — R0602 Shortness of breath: Secondary | ICD-10-CM

## 2018-12-26 DIAGNOSIS — R600 Localized edema: Secondary | ICD-10-CM

## 2018-12-26 DIAGNOSIS — I10 Essential (primary) hypertension: Secondary | ICD-10-CM | POA: Diagnosis not present

## 2018-12-26 DIAGNOSIS — Z6841 Body Mass Index (BMI) 40.0 and over, adult: Secondary | ICD-10-CM

## 2018-12-26 DIAGNOSIS — I5033 Acute on chronic diastolic (congestive) heart failure: Secondary | ICD-10-CM | POA: Diagnosis not present

## 2018-12-26 DIAGNOSIS — R42 Dizziness and giddiness: Secondary | ICD-10-CM | POA: Diagnosis not present

## 2018-12-26 MED ORDER — NEBIVOLOL HCL 20 MG PO TABS: 20.0000 mg | ORAL_TABLET | Freq: Every day | ORAL | 2 refills | Status: DC

## 2018-12-26 NOTE — Progress Notes (Signed)
Primary Physician/Referring:  Chesley Noon, MD  Patient ID: [name], female    DOB: [birth year], 78 y.o.   MRN: [id number]  Chief Complaint  Patient presents with  . Hypertension  . Follow-up    HPI: [name]  is a 78 y.o. female  with hypothyroidism, hypertension, depression/anxiety, OSA on CPAP, history of left breast cancer, hypocalcemia, CKD stage 3, referred to [place] for management of hypertension by PCP, Mattie Marlin, PA. She was seen by [place] for worsening dyspnea and dizziness about 4-6 weeks ago.  She is now taking Antivert at night and it has helped with her dizziness.  She has not had any syncope.   She was started on Aldactone but after 4-5 doses her BP started to climb up and hence discontinued this. She was seen on 5/22. BMP on 12/08/18 revealed mild renal insufficiency which is new to her.   Still c/o dyspnea and leg edema. Dizziness has improved. Meclizine has helped with dizziness. No chest pain, palpitations.   Past Medical History:  Diagnosis Date  . Anemia    PMH  . Anxiety   . Asthma   . Breast cancer ([place]) 1988   left breast cancer  . Collagenous colitis   . Compression fracture    lumbar 1  . Hypertension   . Hypothyroidism   . Osteoarthritis   . Osteopenia   . PONV (postoperative nausea and vomiting)   . Sleep apnea    wears CPAP nightly    Past Surgical History:  Procedure Laterality Date  . BREAST REDUCTION SURGERY     Right  . CATARACT EXTRACTION W/ INTRAOCULAR LENS  IMPLANT, BILATERAL    . CESAREAN SECTION    . COLONOSCOPY W/ BIOPSIES AND POLYPECTOMY    . CYST EXCISION N/A 08/30/2018   Procedure: excision of posterior shoulder / back sebaceous cyst;  Surgeon: Wallace Going, DO;  Location: [facility];  Service: Plastics;  Laterality: N/A;  . KYPHOPLASTY N/A 02/18/2016   Procedure: LUMBAR 1 KYPHOPLASTY;  Surgeon: Phylliss Bob, MD;  Location: [facility];  Service: Orthopedics;  Laterality: N/A;  LUMBAR 1 KYPHOPLASTY   . MODIFIED RADICAL MASTECTOMY W/ AXILLARY LYMPH NODE DISSECTION     Left  . RECONSTRUCTION BREAST W/ LATISSIMUS DORSI FLAP     left  . right foot surgery     corrected hammer toe, straightened big toe  . SEPTOPLASTY    . TOE SURGERY    . TONSILLECTOMY AND ADENOIDECTOMY      Social History   Socioeconomic History  . Marital status: Married    Spouse name: Not on file  . Number of children: 1  . Years of education: Not on file  . Highest education level: Not on file  Occupational History  . Occupation: retired  Scientific laboratory technician  . Financial resource strain: Not on file  . Food insecurity    Worry: Not on file    Inability: Not on file  . Transportation needs    Medical: Not on file    Non-medical: Not on file  Tobacco Use  . Smoking status: Never Smoker  . Smokeless tobacco: Never Used  Substance and Sexual Activity  . Alcohol use: Yes    Comment: social  . Drug use: No  . Sexual activity: Not on file  Lifestyle  . Physical activity    Days per week: Not on file    Minutes per session: Not on file  . Stress: Not on  file  Relationships  . Social Herbalist on phone: Not on file    Gets together: Not on file    Attends religious service: Not on file    Active member of club or organization: Not on file    Attends meetings of clubs or organizations: Not on file    Relationship status: Not on file  . Intimate partner violence    Fear of current or ex partner: Not on file    Emotionally abused: Not on file    Physically abused: Not on file    Forced sexual activity: Not on file  Other Topics Concern  . Not on file  Social History Narrative  . Not on file   Review of Systems  Constitution: Negative for decreased appetite, malaise/fatigue, weight gain and weight loss.  Eyes: Negative for visual disturbance.  Cardiovascular: Positive for leg swelling. Negative for chest pain, claudication, dyspnea on exertion, orthopnea, palpitations and syncope.   Respiratory: Positive for shortness of breath. Negative for hemoptysis and wheezing.   Endocrine: Negative for cold intolerance and heat intolerance.  Hematologic/Lymphatic: Negative for bleeding problem. Does not bruise/bleed easily.  Skin: Negative for nail changes.  Musculoskeletal: Negative for muscle weakness and myalgias.  Gastrointestinal: Negative for abdominal pain, change in bowel habit, nausea and vomiting.  Neurological: Positive for dizziness. Negative for difficulty with concentration, focal weakness and headaches.  Psychiatric/Behavioral: Negative for altered mental status and suicidal ideas.  All other systems reviewed and are negative.     Objective  Blood pressure (!) 145/78, pulse 77, height 5\' 3"  (1.6 m), weight 263 lb (119.3 kg), SpO2 98 %. Body mass index is 46.59 kg/m.    Physical Exam  Constitutional: She is oriented to person, place, and time. Vital signs are normal. She appears well-developed. No distress.  obese  HENT:  Head: Normocephalic and atraumatic.  Eyes: Conjunctivae are normal.  Neck: Normal range of motion. Neck supple. No JVD present. No thyromegaly present.  Cardiovascular: Normal rate, regular rhythm, S1 normal, S2 normal, intact distal pulses and normal pulses. Exam reveals no gallop.  Murmur heard. High-pitched early systolic murmur is present with a grade of 2/6 at the upper right sternal border radiating to the neck. Pulses:      Dorsalis pedis pulses are 2+ on the right side and 2+ on the left side.       Posterior tibial pulses are 2+ on the right side and 2+ on the left side.  3+ pitting edema bilateral  Pulmonary/Chest: Effort normal and breath sounds normal. No accessory muscle usage. No respiratory distress. She has no wheezes.  Abdominal: Soft. Bowel sounds are normal.  Musculoskeletal: Normal range of motion.  Neurological: She is alert and oriented to person, place, and time.  Skin: Skin is warm and dry.  Psychiatric: She has a  normal mood and affect.  Vitals reviewed.  Radiology: No results found.  Laboratory examination:    CMP Latest Ref Rng & Units 12/08/2018 08/25/2018 02/18/2016  Glucose 65 - 99 mg/dL 113(H) 104(H) 103(H)  BUN 8 - 27 mg/dL 19 12 9   Creatinine 0.57 - 1.00 mg/dL 1.01(H) 0.78 0.63  Sodium 134 - 144 mmol/L 142 140 139  Potassium 3.5 - 5.2 mmol/L 3.7 3.4(L) 3.3(L)  Chloride 96 - 106 mmol/L 103 105 107  CO2 20 - 29 mmol/L 22 26 24   Calcium 8.7 - 10.3 mg/dL 9.7 9.4 9.7  Total Protein 6.5 - 8.1 g/dL - - 6.6  Total  Bilirubin 0.3 - 1.2 mg/dL - - 0.7  Alkaline Phos 38 - 126 U/L - - 101  AST 15 - 41 U/L - - 34  ALT 14 - 54 U/L - - 42   CBC Latest Ref Rng & Units 02/18/2016 12/17/2014 12/28/2013  WBC 4.0 - 10.5 K/uL 5.9 5.7 5.4  Hemoglobin 12.0 - 15.0 g/dL 13.8 14.0 13.5  Hematocrit 36.0 - 46.0 % 42.5 41.6 41.2  Platelets 150 - 400 K/uL 168 185 169   Lipid Panel  No results found for: CHOL, TRIG, HDL, CHOLHDL, VLDL, LDLCALC, LDLDIRECT HEMOGLOBIN A1C No results found for: HGBA1C, MPG TSH No results for input(s): TSH in the last 8760 hours.   Medications   Medications Discontinued During This Encounter  Medication Reason  . spironolactone (ALDACTONE) 25 MG tablet Error  . metoprolol succinate (TOPROL-XL) 50 MG 24 hr tablet Discontinued by provider   Current Meds  Medication Sig  . albuterol (PROVENTIL) (2.5 MG/3ML) 0.083% nebulizer solution Take 3 mLs (2.5 mg dose) by nebulization every 6 (six) hours as needed for Wheezing.  Marland Kitchen azelastine (ASTELIN) 0.1 % nasal spray Place 1 spray into both nostrils daily as needed for allergies.  . Azelastine HCl 0.15 % SOLN USE ONE SPRAY NASALLY DAILY  . Calcium Citrate-Vitamin D (CITRACAL + D PO) Take 2 tablets by mouth daily.  . clobetasol cream (TEMOVATE) 1.74 % Apply 1 application topically daily as needed (irritation).  Marland Kitchen dextromethorphan-guaiFENesin (MUCINEX DM) 30-600 MG per 12 hr tablet Take 1 tablet by mouth every 12 (twelve) hours.  .  diclofenac sodium (VOLTAREN) 1 % GEL as needed.  . diltiazem (CARDIZEM CD) 240 MG 24 hr capsule Take 240 mg by mouth daily.  . diphenhydrAMINE (BENADRYL) 25 MG tablet Take 50 mg by mouth 4 (four) times daily.  Marland Kitchen FLOVENT HFA 110 MCG/ACT inhaler USE 2 INHALATIONS DAILY  . furosemide (LASIX) 20 MG tablet Take 20 mg by mouth daily.   . furosemide (LASIX) 40 MG tablet Take 1 tablet by mouth daily.  Marland Kitchen glucosamine-chondroitin 500-400 MG tablet Take 1 tablet by mouth 2 (two) times daily.  . hydrALAZINE (APRESOLINE) 25 MG tablet Take 25 mg by mouth 3 (three) times daily.   Marland Kitchen levothyroxine (SYNTHROID, LEVOTHROID) 88 MCG tablet Take 88 mcg by mouth daily.  Marland Kitchen losartan (COZAAR) 100 MG tablet Take 100 mg by mouth daily.  . meclizine (ANTIVERT) 25 MG tablet Take 25 mg by mouth 3 (three) times daily as needed for dizziness.  . Multiple Vitamins-Minerals (MULTIVITAMIN WITH MINERALS) tablet Take 1 tablet by mouth daily.  . naproxen sodium (ALEVE) 220 MG tablet Take 220 mg by mouth daily as needed.  . NON FORMULARY Mega b stress once a day  . Polyethyl Glycol-Propyl Glycol 0.4-0.3 % SOLN 1 drop as needed.  Marland Kitchen PROAIR HFA 108 (90 Base) MCG/ACT inhaler USE 2 INHALATIONS EVERY 6 HOURS AS NEEDED FOR WHEEZING OR SHORTNESS OF BREATH  . sertraline (ZOLOFT) 100 MG tablet Take 100 mg by mouth 2 (two) times daily.   Marland Kitchen zolpidem (AMBIEN) 10 MG tablet Take 10 mg by mouth at bedtime as needed for sleep.   . [DISCONTINUED] metoprolol succinate (TOPROL-XL) 50 MG 24 hr tablet TAKE ONE TABLET BY MOUTH DAILY    Cardiac Studies:   Echocardiogram 12/26/2018 :  Mildly depressed LV systolic function with EF 50%. Left ventricle cavity is normal in size. Moderate concentric remodeling of the left ventricle. Normal global wall motion. Doppler evidence of grade II (pseudonormal) diastolic dysfunction. Diastolic dysfunction  findings suggests elevated LA/LV end diastolic pressure. Calculated EF 50%. Left atrial cavity is moderately dilated  at 4.5 cm. Structurally normal appearing mitral valve. Mild (Grade I) mitral regurgitation. Structurally normal tricuspid valve with trace regurgitation. Mild pulmonary hypertension. Estimated pulmonary artery systolic pressure is 33 mm Hg with RA pressure estimated at 8 mm Hg. RVSP measures 34 mmHg. IVC is dilated with respiratory variation. This may suggest elevated right heart pressure  Assessment   Acute on chronic diastolic heart failure ([place]) - Plan: PCV MYOCARDIAL PERFUSION WITH LEXISCAN.  Essential hypertension - Plan: Nebivolol HCl 20 MG TABS.  Shortness of breath - Plan: PCV MYOCARDIAL PERFUSION WITH LEXISCAN.  Dizziness.  Class 3 severe obesity due to excess calories with serious comorbidity and body mass index (BMI) of 45.0 to 49.9 in adult [facility])    EKG 11/24/2018: Sinus rhythm/sinus arrhythmia at 64 bpm, normal axis, no evidence of ischemia.   Recommendations:  Patient presents for follow-up and evaluation of shortness of breath and leg edema, I reviewed the results of the echocardiogram that was performed earlier this morning and she has grade 2 diastolic dysfunction.  Acute on chronic diastolic heart failure related to obesity, uncontrolled hypertension and obesity hypoventilation are all contributing to her presentation.  Underlying coronary artery disease cannot be excluded. Schedule for a Lexiscan Sestamibi stress test to evaluate for myocardial ischemia. Patient unable to do treadmill stress testing due to dyspnea.  She states that her blood pressure went up using spironolactone hence discontinued this after 4 days.  I'm not sure this is the response, however I will try Bystolic 20 mg daily and will discontinue metoprolol succinate. She complains of dizziness which is unrelated to blood pressure issues, symptoms appear to have improved with meclizine.  Still has significant leg edema, probably also contributed to obesity hypoventilation and mild pulmonary hypertension.  I  had a long discussion with her and consider regarding her obesity and calorie restriction and salt restriction. I'll see her back in 4 weeks for follow-up.   "Total time spent with patient was  40 minutes and greater than 50% of that time was spent in face to face discussion, counseling and coordination care"   Adrian Prows, MD, [facility] 12/28/2018, 9:23 PM [facility] [place] Pager: [phone] Office: [phone] If no answer Cell [phone]

## 2018-12-26 NOTE — Patient Instructions (Signed)

## 2019-01-01 ENCOUNTER — Ambulatory Visit: Payer: Medicare Other | Admitting: Cardiology

## 2019-01-01 ENCOUNTER — Other Ambulatory Visit: Payer: Self-pay | Admitting: Pulmonary Disease

## 2019-01-17 ENCOUNTER — Other Ambulatory Visit: Payer: Self-pay

## 2019-01-17 ENCOUNTER — Ambulatory Visit (INDEPENDENT_AMBULATORY_CARE_PROVIDER_SITE_OTHER): Payer: Medicare Other

## 2019-01-17 DIAGNOSIS — R0602 Shortness of breath: Secondary | ICD-10-CM | POA: Diagnosis not present

## 2019-01-17 DIAGNOSIS — I5033 Acute on chronic diastolic (congestive) heart failure: Secondary | ICD-10-CM | POA: Diagnosis not present

## 2019-01-17 DIAGNOSIS — I10 Essential (primary) hypertension: Secondary | ICD-10-CM

## 2019-01-17 MED ORDER — NEBIVOLOL HCL 20 MG PO TABS: 20.0000 mg | ORAL_TABLET | Freq: Every day | ORAL | 3 refills | Status: DC

## 2019-01-19 ENCOUNTER — Telehealth: Payer: Self-pay

## 2019-01-19 ENCOUNTER — Other Ambulatory Visit: Payer: Self-pay | Admitting: Cardiology

## 2019-01-19 DIAGNOSIS — I10 Essential (primary) hypertension: Secondary | ICD-10-CM

## 2019-01-19 MED ORDER — LABETALOL HCL 200 MG PO TABS: 200.0000 mg | ORAL_TABLET | Freq: Two times a day (BID) | ORAL | 0 refills | Status: DC

## 2019-01-19 NOTE — Telephone Encounter (Signed)
I will send in for Labetolol 200 mg BID

## 2019-01-19 NOTE — Telephone Encounter (Signed)
Please fill

## 2019-01-19 NOTE — Telephone Encounter (Signed)
Patient called and stated that insurance want pay for Bystolic medication and needed a generic brand or more samples. Would you like to give her more samples or change th medication to something different?

## 2019-01-24 NOTE — Telephone Encounter (Signed)
Pt says that she cannot take Labetalol due to her Asthma. It says on [facility] website to not take it, please advise??  Pt states that she is still taking the Metoprolol

## 2019-01-24 NOTE — Telephone Encounter (Signed)
Please take. It can cause asthma exacerbation but not all the patients. If unable to try, then her options would be expensive BYstolic

## 2019-01-25 NOTE — Telephone Encounter (Signed)
S/w patient advised her to try irbesartan, she agreed, once she receives it. It hasn't come from the mail order yet

## 2019-01-30 ENCOUNTER — Ambulatory Visit: Payer: Medicare Other | Admitting: Cardiology

## 2019-01-31 ENCOUNTER — Telehealth: Payer: Self-pay

## 2019-01-31 NOTE — Telephone Encounter (Signed)
Telephone encounter:  Reason for call: side effects to Labetolol, pt was taking bystolic but do to insurance reasons he switched her. Bystolic worked well btw.. SO pt is c/o "stroke like symptoms"  Confusion, cant finish sentences, lightheaded, dizziness, blurry vision and hands shaking; I told her Lavone Nian is out of office and I would ask you and get back with her tomorrow; Please advise   Usual provider: JG   Last office visit: 11/24/2018  Next office visit: 02/08/2019   Last hospitalization: 08/30/2018   Current Outpatient Medications on File Prior to Visit  Medication Sig Dispense Refill  . albuterol (PROVENTIL) (2.5 MG/3ML) 0.083% nebulizer solution Take 3 mLs (2.5 mg dose) by nebulization every 6 (six) hours as needed for Wheezing.    Marland Kitchen azelastine (ASTELIN) 0.1 % nasal spray Place 1 spray into both nostrils daily as needed for allergies.    . Azelastine HCl 0.15 % SOLN USE ONE SPRAY NASALLY DAILY 90 mL 3  . Calcium Citrate-Vitamin D (CITRACAL + D PO) Take 2 tablets by mouth daily.    . clobetasol cream (TEMOVATE) 5.70 % Apply 1 application topically daily as needed (irritation).    Marland Kitchen dextromethorphan-guaiFENesin (MUCINEX DM) 30-600 MG per 12 hr tablet Take 1 tablet by mouth every 12 (twelve) hours.    . diclofenac sodium (VOLTAREN) 1 % GEL as needed.    . diltiazem (CARDIZEM CD) 240 MG 24 hr capsule Take 240 mg by mouth daily.    . diphenhydrAMINE (BENADRYL) 25 MG tablet Take 50 mg by mouth 4 (four) times daily.    Marland Kitchen FLOVENT HFA 110 MCG/ACT inhaler USE 2 INHALATIONS DAILY 36 g 3  . furosemide (LASIX) 20 MG tablet Take 20 mg by mouth daily.     . furosemide (LASIX) 40 MG tablet Take 1 tablet by mouth daily.    Marland Kitchen glucosamine-chondroitin 500-400 MG tablet Take 1 tablet by mouth 2 (two) times daily.    . hydrALAZINE (APRESOLINE) 25 MG tablet Take 25 mg by mouth 3 (three) times daily.     Marland Kitchen labetalol (NORMODYNE) 200 MG tablet Take 1 tablet (200 mg total) by mouth 2 (two) times daily. 180 tablet 0   . levothyroxine (SYNTHROID, LEVOTHROID) 88 MCG tablet Take 88 mcg by mouth daily.    Marland Kitchen losartan (COZAAR) 100 MG tablet Take 100 mg by mouth daily.    . meclizine (ANTIVERT) 25 MG tablet Take 25 mg by mouth 3 (three) times daily as needed for dizziness.    . Multiple Vitamins-Minerals (MULTIVITAMIN WITH MINERALS) tablet Take 1 tablet by mouth daily.    . naproxen sodium (ALEVE) 220 MG tablet Take 220 mg by mouth daily as needed.    . Nebivolol HCl 20 MG TABS Take 1 tablet (20 mg total) by mouth daily. 30 tablet 2  . NON FORMULARY Mega b stress once a day    . Polyethyl Glycol-Propyl Glycol 0.4-0.3 % SOLN 1 drop as needed.    Marland Kitchen PROAIR HFA 108 (90 Base) MCG/ACT inhaler USE 2 INHALATIONS EVERY 6 HOURS AS NEEDED FOR WHEEZING OR SHORTNESS OF BREATH 25.5 g 3  . sertraline (ZOLOFT) 100 MG tablet Take 100 mg by mouth 2 (two) times daily.     Marland Kitchen zolpidem (AMBIEN) 10 MG tablet Take 10 mg by mouth at bedtime as needed for sleep.      No current facility-administered medications on file prior to visit.

## 2019-01-31 NOTE — Telephone Encounter (Signed)
If having stroke like symptoms, she must go to the ED.

## 2019-02-08 ENCOUNTER — Other Ambulatory Visit: Payer: Self-pay

## 2019-02-08 ENCOUNTER — Ambulatory Visit (INDEPENDENT_AMBULATORY_CARE_PROVIDER_SITE_OTHER): Payer: Medicare Other | Admitting: Cardiology

## 2019-02-08 ENCOUNTER — Encounter: Payer: Self-pay | Admitting: Cardiology

## 2019-02-08 VITALS — BP 138/57 | HR 87 | Ht 61.0 in | Wt 264.0 lb

## 2019-02-08 DIAGNOSIS — I10 Essential (primary) hypertension: Secondary | ICD-10-CM | POA: Diagnosis not present

## 2019-02-08 DIAGNOSIS — Z6841 Body Mass Index (BMI) 40.0 and over, adult: Secondary | ICD-10-CM

## 2019-02-08 DIAGNOSIS — R600 Localized edema: Secondary | ICD-10-CM

## 2019-02-08 DIAGNOSIS — I5032 Chronic diastolic (congestive) heart failure: Secondary | ICD-10-CM

## 2019-02-08 DIAGNOSIS — R0602 Shortness of breath: Secondary | ICD-10-CM

## 2019-02-08 MED ORDER — BISOPROLOL FUMARATE 10 MG PO TABS: 10.0000 mg | ORAL_TABLET | Freq: Every day | ORAL | 2 refills | Status: DC

## 2019-02-08 NOTE — Progress Notes (Signed)
Primary Physician/Referring:  Chesley Noon, MD  Patient ID: [name], female    DOB: Feb 05, 1941, 78 y.o.   MRN: [id number]  Chief Complaint  Patient presents with  . Hypertension  . Shortness of Breath  . Follow-up    HPI: [name]  is a 78 y.o. female  with hypothyroidism, hypertension, depression/anxiety, OSA on CPAP, history of left breast cancer, hypocalcemia, CKD stage 3, referred to [place] for management of hypertension by PCP, Mattie Marlin, PA.   She did very well on Bystolic however it was not approved by the insurance and hence had to be changed and was started on labetalol which he did not tolerate due to worsening asthma and also increasing leg edema and not feeling well but did control the blood pressure better.  She has not been able to tolerate Aldactone due to worsening renal function in the past.  Otherwise denies chest pain, dyspnea has remained stable, no PND or orthopnea.  Past Medical History:  Diagnosis Date  . Anemia    PMH  . Anxiety   . Asthma   . Breast cancer ([place]) 1988   left breast cancer  . Collagenous colitis   . Compression fracture    lumbar 1  . Hypertension   . Hypothyroidism   . Osteoarthritis   . Osteopenia   . PONV (postoperative nausea and vomiting)   . Sleep apnea    wears CPAP nightly    Past Surgical History:  Procedure Laterality Date  . BREAST REDUCTION SURGERY     Right  . CATARACT EXTRACTION W/ INTRAOCULAR LENS  IMPLANT, BILATERAL    . CESAREAN SECTION    . COLONOSCOPY W/ BIOPSIES AND POLYPECTOMY    . CYST EXCISION N/A 08/30/2018   Procedure: excision of posterior shoulder / back sebaceous cyst;  Surgeon: Wallace Going, DO;  Location: [facility];  Service: Plastics;  Laterality: N/A;  . KYPHOPLASTY N/A 02/18/2016   Procedure: LUMBAR 1 KYPHOPLASTY;  Surgeon: Phylliss Bob, MD;  Location: [facility];  Service: Orthopedics;  Laterality: N/A;  LUMBAR 1 KYPHOPLASTY  . MODIFIED RADICAL MASTECTOMY W/  AXILLARY LYMPH NODE DISSECTION     Left  . RECONSTRUCTION BREAST W/ LATISSIMUS DORSI FLAP     left  . right foot surgery     corrected hammer toe, straightened big toe  . SEPTOPLASTY    . TOE SURGERY    . TONSILLECTOMY AND ADENOIDECTOMY      Social History   Socioeconomic History  . Marital status: Married    Spouse name: Not on file  . Number of children: 1  . Years of education: Not on file  . Highest education level: Not on file  Occupational History  . Occupation: retired  Scientific laboratory technician  . Financial resource strain: Not on file  . Food insecurity    Worry: Not on file    Inability: Not on file  . Transportation needs    Medical: Not on file    Non-medical: Not on file  Tobacco Use  . Smoking status: Never Smoker  . Smokeless tobacco: Never Used  Substance and Sexual Activity  . Alcohol use: Yes    Comment: social  . Drug use: No  . Sexual activity: Not on file  Lifestyle  . Physical activity    Days per week: Not on file    Minutes per session: Not on file  . Stress: Not on file  Relationships  . Social connections  Talks on phone: Not on file    Gets together: Not on file    Attends religious service: Not on file    Active member of club or organization: Not on file    Attends meetings of clubs or organizations: Not on file    Relationship status: Not on file  . Intimate partner violence    Fear of current or ex partner: Not on file    Emotionally abused: Not on file    Physically abused: Not on file    Forced sexual activity: Not on file  Other Topics Concern  . Not on file  Social History Narrative  . Not on file   Review of Systems  Constitution: Negative for decreased appetite, malaise/fatigue, weight gain and weight loss.  Eyes: Negative for visual disturbance.  Cardiovascular: Positive for leg swelling. Negative for chest pain, claudication, dyspnea on exertion, orthopnea, palpitations and syncope.  Respiratory: Positive for shortness of  breath. Negative for hemoptysis and wheezing.   Endocrine: Negative for cold intolerance and heat intolerance.  Hematologic/Lymphatic: Negative for bleeding problem. Does not bruise/bleed easily.  Skin: Negative for nail changes.  Musculoskeletal: Negative for muscle weakness and myalgias.  Gastrointestinal: Negative for abdominal pain, change in bowel habit, nausea and vomiting.  Neurological: Positive for dizziness. Negative for difficulty with concentration, focal weakness and headaches.  Psychiatric/Behavioral: Negative for altered mental status and suicidal ideas.  All other systems reviewed and are negative.     Objective  Blood pressure (!) 138/57, pulse 87, height 5\' 1"  (1.549 m), weight 264 lb (119.7 kg), SpO2 97 %. Body mass index is 49.88 kg/m.    Physical Exam  Constitutional: She is oriented to person, place, and time. Vital signs are normal. She appears well-developed. No distress.  obese  HENT:  Head: Normocephalic and atraumatic.  Eyes: Conjunctivae are normal.  Neck: Normal range of motion. Neck supple. No JVD present. No thyromegaly present.  Cardiovascular: Normal rate, regular rhythm, S1 normal, S2 normal, intact distal pulses and normal pulses. Exam reveals no gallop.  Murmur heard. High-pitched early systolic murmur is present with a grade of 2/6 at the upper right sternal border radiating to the neck. Pulses:      Dorsalis pedis pulses are 2+ on the right side and 2+ on the left side.       Posterior tibial pulses are 2+ on the right side and 2+ on the left side.  2+ pitting edema bilateral  Pulmonary/Chest: Effort normal and breath sounds normal. No accessory muscle usage. No respiratory distress. She has no wheezes.  Abdominal: Soft. Bowel sounds are normal.  Musculoskeletal: Normal range of motion.  Neurological: She is alert and oriented to person, place, and time.  Skin: Skin is warm and dry.  Psychiatric: She has a normal mood and affect.  Vitals  reviewed.  Radiology: No results found.  Laboratory examination:   CMP Latest Ref Rng & Units 12/08/2018 08/25/2018 02/18/2016  Glucose 65 - 99 mg/dL 113(H) 104(H) 103(H)  BUN 8 - 27 mg/dL 19 12 9   Creatinine 0.57 - 1.00 mg/dL 1.01(H) 0.78 0.63  Sodium 134 - 144 mmol/L 142 140 139  Potassium 3.5 - 5.2 mmol/L 3.7 3.4(L) 3.3(L)  Chloride 96 - 106 mmol/L 103 105 107  CO2 20 - 29 mmol/L 22 26 24   Calcium 8.7 - 10.3 mg/dL 9.7 9.4 9.7  Total Protein 6.5 - 8.1 g/dL - - 6.6  Total Bilirubin 0.3 - 1.2 mg/dL - - 0.7  Alkaline Phos  38 - 126 U/L - - 101  AST 15 - 41 U/L - - 34  ALT 14 - 54 U/L - - 42   CBC Latest Ref Rng & Units 02/18/2016 12/17/2014 12/28/2013  WBC 4.0 - 10.5 K/uL 5.9 5.7 5.4  Hemoglobin 12.0 - 15.0 g/dL 13.8 14.0 13.5  Hematocrit 36.0 - 46.0 % 42.5 41.6 41.2  Platelets 150 - 400 K/uL 168 185 169   Lipid Panel  No results found for: CHOL, TRIG, HDL, CHOLHDL, VLDL, LDLCALC, LDLDIRECT HEMOGLOBIN A1C No results found for: HGBA1C, MPG TSH No results for input(s): TSH in the last 8760 hours.   Medications   Medications Discontinued During This Encounter  Medication Reason  . labetalol (NORMODYNE) 200 MG tablet Error  . Nebivolol HCl 20 MG TABS Error   Current Meds  Medication Sig  . albuterol (PROVENTIL) (2.5 MG/3ML) 0.083% nebulizer solution Take 3 mLs (2.5 mg dose) by nebulization every 6 (six) hours as needed for Wheezing.  Marland Kitchen azelastine (ASTELIN) 0.1 % nasal spray Place 1 spray into both nostrils daily as needed for allergies.  . Azelastine HCl 0.15 % SOLN USE ONE SPRAY NASALLY DAILY  . Calcium Citrate-Vitamin D (CITRACAL + D PO) Take 2 tablets by mouth daily.  . clobetasol cream (TEMOVATE) 2.83 % Apply 1 application topically daily as needed (irritation).  Marland Kitchen dextromethorphan-guaiFENesin (MUCINEX DM) 30-600 MG per 12 hr tablet Take 1 tablet by mouth every 12 (twelve) hours.  . diclofenac sodium (VOLTAREN) 1 % GEL as needed.  . diltiazem (CARDIZEM CD) 240 MG 24 hr  capsule Take 240 mg by mouth daily.  . diphenhydrAMINE (BENADRYL) 25 MG tablet Take 50 mg by mouth 4 (four) times daily.  Marland Kitchen FLOVENT HFA 110 MCG/ACT inhaler USE 2 INHALATIONS DAILY  . furosemide (LASIX) 20 MG tablet Take 20 mg by mouth daily.   . furosemide (LASIX) 40 MG tablet Take 1 tablet by mouth daily.  Marland Kitchen glucosamine-chondroitin 500-400 MG tablet Take 1 tablet by mouth 2 (two) times daily.  . hydrALAZINE (APRESOLINE) 25 MG tablet Take 25 mg by mouth 3 (three) times daily.   Marland Kitchen levothyroxine (SYNTHROID, LEVOTHROID) 88 MCG tablet Take 88 mcg by mouth daily.  Marland Kitchen losartan (COZAAR) 100 MG tablet Take 100 mg by mouth daily.  . meclizine (ANTIVERT) 25 MG tablet Take 25 mg by mouth 2 (two) times a week.   . Multiple Vitamins-Minerals (MULTIVITAMIN WITH MINERALS) tablet Take 1 tablet by mouth daily.  . naproxen sodium (ALEVE) 220 MG tablet Take 220 mg by mouth daily as needed.  . NON FORMULARY Mega b stress once a day  . Polyethyl Glycol-Propyl Glycol 0.4-0.3 % SOLN 1 drop as needed.  Marland Kitchen PROAIR HFA 108 (90 Base) MCG/ACT inhaler USE 2 INHALATIONS EVERY 6 HOURS AS NEEDED FOR WHEEZING OR SHORTNESS OF BREATH  . sertraline (ZOLOFT) 100 MG tablet Take 100 mg by mouth 2 (two) times daily.   Marland Kitchen zolpidem (AMBIEN) 10 MG tablet Take 10 mg by mouth at bedtime as needed for sleep.   . [DISCONTINUED] labetalol (NORMODYNE) 200 MG tablet Take 1 tablet (200 mg total) by mouth 2 (two) times daily.    Cardiac Studies:   Lexiscan Myoview stress test 01/17/2019: Lexiscan stress test was performed. Stress EKG is non-diagnostic, as this is pharmacological stress test. Pharmacological myocardial perfusion imaging is normal. LVEF 69%. Low risk study.    Echocardiogram 12/26/2018 :  Mildly depressed LV systolic function with EF 50%. Left ventricle cavity is normal in size. Moderate concentric  remodeling of the left ventricle. Normal global wall motion. Doppler evidence of grade II (pseudonormal) diastolic dysfunction.  Diastolic dysfunction findings suggests elevated LA/LV end diastolic pressure. Calculated EF 50%. Left atrial cavity is moderately dilated at 4.5 cm. Structurally normal appearing mitral valve. Mild (Grade I) mitral regurgitation. Structurally normal tricuspid valve with trace regurgitation. Mild pulmonary hypertension. Estimated pulmonary artery systolic pressure is 33 mm Hg with RA pressure estimated at 8 mm Hg. RVSP measures 34 mmHg. IVC is dilated with respiratory variation. This may suggest elevated right heart pressure  Assessment     ICD-10-CM   1. Essential hypertension  I10 bisoprolol (ZEBETA) 10 MG tablet  2. Chronic diastolic heart failure (HCC)  I50.32   3. Shortness of breath  R06.02   4. Class 3 severe obesity due to excess calories with serious comorbidity and body mass index (BMI) of 45.0 to 49.9 in adult (HCC)  E66.01    Z68.42   5. Leg edema  R60.0    EKG 11/24/2018: Sinus rhythm/sinus arrhythmia at 64 bpm, normal axis, no evidence of ischemia.   Recommendations:   Patient with chronic diastolic heart failure, morbid obesity and hypertension presents for follow-up of dyspnea.  I reviewed the results of the nuclear stress test which is nonischemic and low risk.   I again discussed with her regarding chronic diastolic heart failure. Overall she's concerned about elevated blood pressure although it is relatively well controlled.  She tolerated Bystolic the best with no side effects, however it was not approved by her insurance.  Adrian Prows, MD, [facility] 02/08/2019, 4:58 PM [facility] [place] Pager: [phone] Office: [phone] If no answer Cell [phone]

## 2019-02-11 ENCOUNTER — Encounter: Payer: Self-pay | Admitting: Cardiology

## 2019-03-29 ENCOUNTER — Ambulatory Visit: Payer: Medicare Other | Admitting: Cardiology

## 2019-04-17 ENCOUNTER — Other Ambulatory Visit: Payer: Self-pay

## 2019-04-17 ENCOUNTER — Ambulatory Visit (INDEPENDENT_AMBULATORY_CARE_PROVIDER_SITE_OTHER): Payer: Medicare Other | Admitting: Cardiology

## 2019-04-17 ENCOUNTER — Encounter: Payer: Self-pay | Admitting: Cardiology

## 2019-04-17 VITALS — BP 142/60 | HR 62 | Temp 97.8°F | Ht 61.0 in | Wt 262.0 lb

## 2019-04-17 DIAGNOSIS — I5032 Chronic diastolic (congestive) heart failure: Secondary | ICD-10-CM

## 2019-04-17 DIAGNOSIS — Z6841 Body Mass Index (BMI) 40.0 and over, adult: Secondary | ICD-10-CM

## 2019-04-17 DIAGNOSIS — I10 Essential (primary) hypertension: Secondary | ICD-10-CM

## 2019-04-17 MED ORDER — BYSTOLIC 20 MG PO TABS: 1.0000 | ORAL_TABLET | Freq: Every day | ORAL | 1 refills | Status: DC

## 2019-04-17 NOTE — Progress Notes (Signed)
Primary Physician/Referring:  Chesley Noon, MD  Patient ID: [name], female    DOB: October 23, 1940, 78 y.o.   MRN: [id number]  Chief Complaint  Patient presents with  . Shortness of Breath  . Hypertension  . Follow-up    6 weeks    HPI: [name]  is a 78 y.o. female  with hypothyroidism, hypertension, depression/anxiety, OSA on CPAP, history of left breast cancer, hypocalcemia, CKD stage 3.   Patient was seen by me on 02/08/2019 and presents here for follow-up of hypertension and dyspnea.  She being treated for chronic diastolic heart failure.  On her last office visit due to insurance reasons, Bystolic was discontinued and she was started on bisoprolol 10 mg daily.  She now presents here for follow-up. States that since being on bisoprolol, she has noticed again gradual worsening of leg edema and also mild dyspnea.  She did not tolerate labetalol due to worsening asthma and Aldactone caused worsening renal function. Otherwise denies chest pain, dyspnea has remained stable, no PND or orthopnea.  Past Medical History:  Diagnosis Date  . Anemia    PMH  . Anxiety   . Asthma   . Breast cancer ([place]) 1988   left breast cancer  . Collagenous colitis   . Compression fracture    lumbar 1  . Hypertension   . Hypothyroidism   . Osteoarthritis   . Osteopenia   . PONV (postoperative nausea and vomiting)   . Sleep apnea    wears CPAP nightly    Past Surgical History:  Procedure Laterality Date  . BREAST REDUCTION SURGERY     Right  . CATARACT EXTRACTION W/ INTRAOCULAR LENS  IMPLANT, BILATERAL    . CESAREAN SECTION    . COLONOSCOPY W/ BIOPSIES AND POLYPECTOMY    . CYST EXCISION N/A 08/30/2018   Procedure: excision of posterior shoulder / back sebaceous cyst;  Surgeon: Wallace Going, DO;  Location: [facility];  Service: Plastics;  Laterality: N/A;  . KYPHOPLASTY N/A 02/18/2016   Procedure: LUMBAR 1 KYPHOPLASTY;  Surgeon: Phylliss Bob, MD;   Location: [facility];  Service: Orthopedics;  Laterality: N/A;  LUMBAR 1 KYPHOPLASTY  . MODIFIED RADICAL MASTECTOMY W/ AXILLARY LYMPH NODE DISSECTION     Left  . RECONSTRUCTION BREAST W/ LATISSIMUS DORSI FLAP     left  . right foot surgery     corrected hammer toe, straightened big toe  . SEPTOPLASTY    . TOE SURGERY    . TONSILLECTOMY AND ADENOIDECTOMY      Social History   Socioeconomic History  . Marital status: Married    Spouse name: Not on file  . Number of children: 1  . Years of education: Not on file  . Highest education level: Not on file  Occupational History  . Occupation: retired  Scientific laboratory technician  . Financial resource strain: Not on file  . Food insecurity    Worry: Not on file    Inability: Not on file  . Transportation needs    Medical: Not on file    Non-medical: Not on file  Tobacco Use  . Smoking status: Never Smoker  . Smokeless tobacco: Never Used  Substance and Sexual Activity  . Alcohol use: Yes    Comment: social  . Drug use: No  . Sexual activity: Not on file  Lifestyle  . Physical activity    Days per week: Not on file    Minutes per session: Not on  file  . Stress: Not on file  Relationships  . Social Herbalist on phone: Not on file    Gets together: Not on file    Attends religious service: Not on file    Active member of club or organization: Not on file    Attends meetings of clubs or organizations: Not on file    Relationship status: Not on file  . Intimate partner violence    Fear of current or ex partner: Not on file    Emotionally abused: Not on file    Physically abused: Not on file    Forced sexual activity: Not on file  Other Topics Concern  . Not on file  Social History Narrative  . Not on file   Review of Systems  Constitution: Negative for decreased appetite, malaise/fatigue, weight gain and weight loss.  Eyes: Negative for visual disturbance.  Cardiovascular: Positive for leg swelling. Negative for chest pain,  claudication, dyspnea on exertion, orthopnea, palpitations and syncope.  Respiratory: Positive for shortness of breath. Negative for hemoptysis and wheezing.   Endocrine: Negative for cold intolerance and heat intolerance.  Hematologic/Lymphatic: Negative for bleeding problem. Does not bruise/bleed easily.  Skin: Negative for nail changes.  Musculoskeletal: Negative for muscle weakness and myalgias.  Gastrointestinal: Negative for abdominal pain, change in bowel habit, nausea and vomiting.  Neurological: Positive for dizziness. Negative for difficulty with concentration, focal weakness and headaches.  Psychiatric/Behavioral: Negative for altered mental status and suicidal ideas.  All other systems reviewed and are negative.     Objective   Vitals with BMI 04/17/2019 02/08/2019 12/26/2018  Height 5\' 1"  5\' 1"  5\' 3"   Weight 262 lbs 264 lbs 263 lbs  BMI 49.53 [id number] 0000000  Systolic [id number] 0000000 [id number]  Diastolic 60 57 78  Pulse 62 87 77    Physical Exam  Constitutional: She is oriented to person, place, and time. Vital signs are normal. She appears well-developed. No distress.  obese  HENT:  Head: Normocephalic and atraumatic.  Eyes: Conjunctivae are normal.  Neck: Normal range of motion. Neck supple. No JVD present. No thyromegaly present.  Cardiovascular: Normal rate, regular rhythm, S1 normal, S2 normal, intact distal pulses and normal pulses. Exam reveals no gallop.  Murmur heard. High-pitched early systolic murmur is present with a grade of 2/6 at the upper right sternal border radiating to the neck. Pulses:      Dorsalis pedis pulses are 2+ on the right side and 2+ on the left side.       Posterior tibial pulses are 2+ on the right side and 2+ on the left side.  2+ pitting edema bilateral  Pulmonary/Chest: Effort normal and breath sounds normal. No accessory muscle usage. No respiratory distress. She has no wheezes.  Abdominal: Soft. Bowel sounds are normal.  Musculoskeletal: Normal range  of motion.  Neurological: She is alert and oriented to person, place, and time.  Skin: Skin is warm and dry.  Psychiatric: She has a normal mood and affect.  Vitals reviewed.  Radiology: No results found.  Laboratory examination:   CMP Latest Ref Rng & Units 12/08/2018 08/25/2018 02/18/2016  Glucose 65 - 99 mg/dL 113(H) 104(H) 103(H)  BUN 8 - 27 mg/dL 19 12 9   Creatinine 0.57 - 1.00 mg/dL 1.01(H) 0.78 0.63  Sodium 134 - 144 mmol/L 142 140 139  Potassium 3.5 - 5.2 mmol/L 3.7 3.4(L) 3.3(L)  Chloride 96 - 106 mmol/L 103 105 107  CO2 20 - 29 mmol/L 22  26 24  Calcium 8.7 - 10.3 mg/dL 9.7 9.4 9.7  Total Protein 6.5 - 8.1 g/dL - - 6.6  Total Bilirubin 0.3 - 1.2 mg/dL - - 0.7  Alkaline Phos 38 - 126 U/L - - 101  AST 15 - 41 U/L - - 34  ALT 14 - 54 U/L - - 42   CBC Latest Ref Rng & Units 02/18/2016 12/17/2014 12/28/2013  WBC 4.0 - 10.5 K/uL 5.9 5.7 5.4  Hemoglobin 12.0 - 15.0 g/dL 13.8 14.0 13.5  Hematocrit 36.0 - 46.0 % 42.5 41.6 41.2  Platelets 150 - 400 K/uL 168 185 169   Lipid Panel  No results found for: CHOL, TRIG, HDL, CHOLHDL, VLDL, LDLCALC, LDLDIRECT HEMOGLOBIN A1C No results found for: HGBA1C, MPG TSH No results for input(s): TSH in the last 8760 hours.   Medications   Medications Discontinued During This Encounter  Medication Reason  . azelastine (ASTELIN) 0.1 % nasal spray Error  . Azelastine HCl 0.15 % SOLN Error  . zolpidem (AMBIEN) 10 MG tablet Error  . bisoprolol (ZEBETA) 10 MG tablet Discontinued by provider   Current Meds  Medication Sig  . albuterol (PROVENTIL) (2.5 MG/3ML) 0.083% nebulizer solution Take 3 mLs (2.5 mg dose) by nebulization every 6 (six) hours as needed for Wheezing.  . Calcium Citrate-Vitamin D (CITRACAL + D PO) Take 2 tablets by mouth daily.  . clobetasol cream (TEMOVATE) [id number] % Apply 1 application topically daily as needed (irritation).  Marland Kitchen dextromethorphan-guaiFENesin (MUCINEX DM) 30-600 MG per 12 hr tablet Take 1 tablet by mouth every 12  (twelve) hours.  . diclofenac sodium (VOLTAREN) 1 % GEL as needed.  . diltiazem (CARDIZEM CD) 240 MG 24 hr capsule Take 240 mg by mouth daily.  . diphenhydrAMINE (BENADRYL) 25 MG tablet Take 50 mg by mouth 4 (four) times daily.  Marland Kitchen FLOVENT HFA 110 MCG/ACT inhaler USE 2 INHALATIONS DAILY  . furosemide (LASIX) 20 MG tablet Take 20 mg by mouth daily.   . furosemide (LASIX) 40 MG tablet Take 1 tablet by mouth daily.  Marland Kitchen glucosamine-chondroitin 500-400 MG tablet Take 1 tablet by mouth 2 (two) times daily.  . hydrALAZINE (APRESOLINE) 25 MG tablet Take 25 mg by mouth 3 (three) times daily.   Marland Kitchen levothyroxine (SYNTHROID, LEVOTHROID) 88 MCG tablet Take 88 mcg by mouth daily.  Marland Kitchen losartan (COZAAR) 100 MG tablet Take 100 mg by mouth daily.  . meclizine (ANTIVERT) 25 MG tablet Take 25 mg by mouth 2 (two) times a week.   . Multiple Vitamins-Minerals (MULTIVITAMIN WITH MINERALS) tablet Take 1 tablet by mouth daily.  . naproxen sodium (ALEVE) 220 MG tablet Take 220 mg by mouth daily as needed.  . NON FORMULARY Mega b stress once a day  . Polyethyl Glycol-Propyl Glycol 0.4-0.3 % SOLN 1 drop as needed.  Marland Kitchen PROAIR HFA 108 (90 Base) MCG/ACT inhaler USE 2 INHALATIONS EVERY 6 HOURS AS NEEDED FOR WHEEZING OR SHORTNESS OF BREATH  . sertraline (ZOLOFT) 100 MG tablet Take 100 mg by mouth 2 (two) times daily.   . [DISCONTINUED] azelastine (ASTELIN) 0.1 % nasal spray Place 1 spray into both nostrils daily as needed for allergies.  . [DISCONTINUED] bisoprolol (ZEBETA) 10 MG tablet Take 1 tablet (10 mg total) by mouth daily.    Cardiac Studies:   Lexiscan Myoview stress test 01/17/2019: Lexiscan stress test was performed. Stress EKG is non-diagnostic, as this is pharmacological stress test. Pharmacological myocardial perfusion imaging is normal. LVEF 69%. Low risk study.    Echocardiogram  12/26/2018 :  Mildly depressed LV systolic function with EF 50%. Left ventricle cavity is normal in size. Moderate concentric  remodeling of the left ventricle. Normal global wall motion. Doppler evidence of grade II (pseudonormal) diastolic dysfunction. Diastolic dysfunction findings suggests elevated LA/LV end diastolic pressure. Calculated EF 50%. Left atrial cavity is moderately dilated at 4.5 cm. Structurally normal appearing mitral valve. Mild (Grade I) mitral regurgitation. Structurally normal tricuspid valve with trace regurgitation. Mild pulmonary hypertension. Estimated pulmonary artery systolic pressure is 33 mm Hg with RA pressure estimated at 8 mm Hg. RVSP measures 34 mmHg. IVC is dilated with respiratory variation. This may suggest elevated right heart pressure  Assessment     ICD-10-CM   1. Essential hypertension  I10 Nebivolol HCl (BYSTOLIC) 20 MG TABS  2. Chronic diastolic heart failure (HCC)  I50.32   3. Class 3 severe obesity due to excess calories with serious comorbidity and body mass index (BMI) of 45.0 to 49.9 in adult [facility])  E66.01    Z68.42    EKG 11/24/2018: Sinus rhythm/sinus arrhythmia at 64 bpm, normal axis, no evidence of ischemia.   Recommendations:   Patient with chronic diastolic heart failure, morbid obesity and hypertension presents for follow-up of Hypertension, chronic diastolic heart failure and dyspnea.  Her blood pressure is still slightly elevated bisoprolol, she has also started developing worsening leg edema.  She did best when she was on Bystolic, patient would like to go back on Bystolic.  I restarted her back on Bystolic 20 mg once a day and discontinued bisoprolol.  We'll try to get prior authorization from [employer] as she has tried most of the drugs and has not been successful in maintaining blood pressure and also avoiding chronic diastolic heart failure.  I have again discussed with her regarding calorie restriction and also watch her salt intake.  Unless she is having problems with worsening dyspnea are uncontrolled hypertension, I'll see her back in 6  months.  Adrian Prows, MD, [facility] 04/17/2019, 4:09 PM [facility] [place] Pager: [phone] Office: [phone] If no answer Cell [phone].

## 2019-04-19 ENCOUNTER — Telehealth: Payer: Self-pay

## 2019-04-19 NOTE — Telephone Encounter (Signed)
I faxed info over th [employer] for bystolic prior auth

## 2019-07-17 ENCOUNTER — Ambulatory Visit: Payer: Medicare Other | Attending: Internal Medicine

## 2019-07-17 DIAGNOSIS — Z23 Encounter for immunization: Secondary | ICD-10-CM | POA: Insufficient documentation

## 2019-07-17 NOTE — Progress Notes (Signed)
   Covid-19 Vaccination Clinic  Name:  [name]    MRN: [id number] DOB: [birth year]  07/17/2019  [name] was observed post Covid-19 immunization for 15 minutes without incidence. She was provided with Vaccine Information Sheet and instruction to access the V-Safe system.   [name] was instructed to call 911 with any severe reactions post vaccine: Marland Kitchen Difficulty breathing  . Swelling of your face and throat  . A fast heartbeat  . A bad rash all over your body  . Dizziness and weakness    Immunizations Administered    Name Date Dose VIS Date Route   Pfizer COVID-19 Vaccine 07/17/2019 11:22 AM 0.3 mL 06/15/2019 Intramuscular   Manufacturer: [place]   Lot: [id number]   [place]: [id number]

## 2019-08-06 ENCOUNTER — Ambulatory Visit: Payer: Medicare HMO | Attending: Internal Medicine

## 2019-08-06 DIAGNOSIS — Z23 Encounter for immunization: Secondary | ICD-10-CM | POA: Insufficient documentation

## 2019-08-06 NOTE — Progress Notes (Signed)
   Covid-19 Vaccination Clinic  Name:  KEONDRA ISIDORO    MRN: [id number] DOB: [birth year]  08/06/2019  Ms. Ridgley was observed post Covid-19 immunization for 15 minutes without incidence. She was provided with Vaccine Information Sheet and instruction to access the V-Safe system.   Ms. Torry was instructed to call 911 with any severe reactions post vaccine: Marland Kitchen Difficulty breathing  . Swelling of your face and throat  . A fast heartbeat  . A bad rash all over your body  . Dizziness and weakness    Immunizations Administered    Name Date Dose VIS Date Route   Pfizer COVID-19 Vaccine 08/06/2019 11:20 AM 0.3 mL 06/15/2019 Intramuscular   Manufacturer: [place]   Lot: [id number]   [place]: [id number]

## 2019-08-12 ENCOUNTER — Other Ambulatory Visit: Payer: Self-pay | Admitting: Pulmonary Disease

## 2019-08-20 DIAGNOSIS — G4733 Obstructive sleep apnea (adult) (pediatric): Secondary | ICD-10-CM | POA: Diagnosis not present

## 2019-08-31 ENCOUNTER — Ambulatory Visit (INDEPENDENT_AMBULATORY_CARE_PROVIDER_SITE_OTHER): Payer: Medicare HMO | Admitting: Pulmonary Disease

## 2019-08-31 ENCOUNTER — Encounter: Payer: Self-pay | Admitting: Pulmonary Disease

## 2019-08-31 ENCOUNTER — Other Ambulatory Visit: Payer: Self-pay

## 2019-08-31 DIAGNOSIS — J309 Allergic rhinitis, unspecified: Secondary | ICD-10-CM | POA: Diagnosis not present

## 2019-08-31 DIAGNOSIS — J4530 Mild persistent asthma, uncomplicated: Secondary | ICD-10-CM | POA: Diagnosis not present

## 2019-08-31 DIAGNOSIS — G4733 Obstructive sleep apnea (adult) (pediatric): Secondary | ICD-10-CM

## 2019-08-31 MED ORDER — ALBUTEROL SULFATE HFA 108 (90 BASE) MCG/ACT IN AERS: INHALATION_SPRAY | RESPIRATORY_TRACT | 3 refills | Status: DC

## 2019-08-31 MED ORDER — ALBUTEROL SULFATE (2.5 MG/3ML) 0.083% IN NEBU: INHALATION_SOLUTION | RESPIRATORY_TRACT | 3 refills | Status: DC

## 2019-08-31 MED ORDER — FLOVENT HFA 110 MCG/ACT IN AERO: INHALATION_SPRAY | RESPIRATORY_TRACT | 5 refills | Status: DC

## 2019-08-31 NOTE — Patient Instructions (Addendum)
You were seen today by Lauraine Rinne, NP  for:   1. OSA (obstructive sleep apnea)  - Ambulatory Referral for DME  We recommend that you continue using your CPAP daily >>>Keep up the hard work using your device >>> Goal should be wearing this for the entire night that you are sleeping, at least 4 to 6 hours  Remember:  . Do not drive or operate heavy machinery if tired or drowsy.  . Please notify the supply company and office if you are unable to use your device regularly due to missing supplies or machine being broken.  . Work on maintaining a healthy weight and following your recommended nutrition plan  . Maintain proper daily exercise and movement  . Maintaining proper use of your device can also help improve management of other chronic illnesses such as: Blood pressure, blood sugars, and weight management.   BiPAP/ CPAP Cleaning:  >>>Clean weekly, with Dawn soap, and bottle brush.  Set up to air dry. >>> Wipe mask out daily with wet wipe or towelette   2. Mild persistent asthma without complication  - albuterol (PROAIR HFA) 108 (90 Base) MCG/ACT inhaler; USE 2 INHALATIONS EVERY 6 HOURS AS NEEDED FOR WHEEZING OR SHORTNESS OF BREATH  Dispense: 25.5 g; Refill: 3 - albuterol (PROVENTIL) (2.5 MG/3ML) 0.083% nebulizer solution; Take 3 mLs (2.5 mg dose) by nebulization every 6 (six) hours as needed for Wheezing.  Dispense: 75 mL; Refill: 3  Continue Flovent 110 2 puffs daily, rinse mouth out after use  Only use your albuterol as a rescue medication to be used if you can't catch your breath by resting or doing a relaxed purse lip breathing pattern.  - The less you use it, the better it will work when you need it. - Ok to use up to 2 puffs  every 4 hours if you must but call for immediate appointment if use goes up over your usual need - Don't leave home without it !!  (think of it like the spare tire for your car)   Can use albuterol nebulizer or rescue inhaler every 4-6 hours as needed  for shortness of breath or wheezing  If you are having to use your albuterol more than 2 times daily please contact our office  3. Allergic rhinitis, unspecified seasonality, unspecified trigger  You can start nasal saline rinses 1-2 times a day Use distilled water Lukewarm water Shake well  Please start taking a daily antihistamine:  >>>choose one of: zyrtec, claritin, allegra, or xyzal  >>>these are over the counter medications  >>>can choose generic option  >>>take daily  >>>this medication helps with allergies, post nasal drip, and cough      We recommend today:  Orders Placed This Encounter  Procedures  . Ambulatory Referral for DME    Referral Priority:   Routine    Referral Type:   Durable Medical Equipment Purchase    Number of Visits Requested:   1   Orders Placed This Encounter  Procedures  . Ambulatory Referral for DME   Meds ordered this encounter  Medications  . fluticasone (FLOVENT HFA) 110 MCG/ACT inhaler    Sig: USE 2 INHALATIONS DAILY    Dispense:  36 g    Refill:  5  . albuterol (PROAIR HFA) 108 (90 Base) MCG/ACT inhaler    Sig: USE 2 INHALATIONS EVERY 6 HOURS AS NEEDED FOR WHEEZING OR SHORTNESS OF BREATH    Dispense:  25.5 g    Refill:  3  .  albuterol (PROVENTIL) (2.5 MG/3ML) 0.083% nebulizer solution    Sig: Take 3 mLs (2.5 mg dose) by nebulization every 6 (six) hours as needed for Wheezing.    Dispense:  75 mL    Refill:  3    Follow Up:    Return in about 6 months (around 02/28/2020), or if symptoms worsen or fail to improve, for Follow up with Dr. Halford Chessman.   Please do your part to reduce the spread of COVID-19:      Reduce your risk of any infection  and COVID19 by using the similar precautions used for avoiding the common cold or flu:  Marland Kitchen Wash your hands often with soap and warm water for at least 20 seconds.  If soap and water are not readily available, use an alcohol-based hand sanitizer with at least 60% alcohol.  . If coughing or  sneezing, cover your mouth and nose by coughing or sneezing into the elbow areas of your shirt or coat, into a tissue or into your sleeve (not your hands). Langley Gauss A MASK when in public  . Avoid shaking hands with others and consider head nods or verbal greetings only. . Avoid touching your eyes, nose, or mouth with unwashed hands.  . Avoid close contact with people who are sick. . Avoid places or events with large numbers of people in one location, like concerts or sporting events. . If you have some symptoms but not all symptoms, continue to monitor at home and seek medical attention if your symptoms worsen. . If you are having a medical emergency, call 911.   [place] / e-Visit: [email or web address]         [facility]: [facility]: [id number]                   [facility]: [id number]     It is flu season:   >>> Best ways to protect herself from the flu: Receive the yearly flu vaccine, practice good hand hygiene washing with soap and also using hand sanitizer when available, eat a nutritious meals, get adequate rest, hydrate appropriately   Please contact the office if your symptoms worsen or you have concerns that you are not improving.   Thank you for choosing [facility] for your healthcare, and for allowing [place] to partner with you on your healthcare journey. I am thankful to be able to provide care to you today.   Wyn Quaker FNP-C

## 2019-08-31 NOTE — Assessment & Plan Note (Addendum)
Did not like astelin nasal spray  No longer on Singulair Occasionally has nasal congestion Flares with cold and rainy weather  Plan: Can use nasal saline rinses 1-2 times daily to help with nasal congestion Okay to take an occasional Benadryl in the evening to help with symptoms If you are having persistent nasal congestion then would recommend starting a daily antihistamine

## 2019-08-31 NOTE — Assessment & Plan Note (Signed)
Excellent CPAP compliance  Plan: Continue CPAP therapy Refill supplies Follow-up with our office in 6 to 12 months

## 2019-08-31 NOTE — Assessment & Plan Note (Addendum)
Plan:  Continue Flovent HFA  Can use rescue inhaler or nebulizer every 4-6 hours as needed  Can use nasal saline rinses 1-2 times daily Follow-up with our office in 6 months

## 2019-08-31 NOTE — Progress Notes (Signed)
Virtual Visit via Telephone Note  I connected with [name] on 08/31/19 at 12:00 PM EST by telephone and verified that I am speaking with the correct person using two identifiers.  Location: Patient: Home Provider: [facility] - [id number], [place], [place], [place] [address]   I discussed the limitations, risks, security and privacy concerns of performing an evaluation and management service by telephone and the availability of in person appointments. I also discussed with the patient that there may be a patient responsible charge related to this service. The patient expressed understanding and agreed to proceed.  Patient consented to consult via telephone: Yes People present and their role in pt care: Pt    History of Present Illness:  79 year old female followed in our office for obstructive sleep apnea and asthma  Past medical history: Osteoarthritis, hypothyroidism, history of breast cancer (left breast), status post reconstruction of both breasts, insomnia, severe obesity Smoking history: Never smoker Maintenance: Flovent 110 Patient of Dr. Halford Chessman  Chief complaint: Refill of medications, follow-up  Patient was last seen in our office in September/2019.  That was a stable office visit.  Patient was encouraged to remain on her CPAP with a set pressure of 11.  She was encouraged to remain on Flovent and continue Singulair.  She has a history of upper airway cough syndrome which is managed by nasal irrigation, Flonase and Astelin.  Patient is not currently on Singulair.  Patient did not tolerate Astelin nasal spray.  Patient has done quite well on CPAP therapy.  CPAP compliance report confirms this: See compliance report listed below:  07/31/2019-08/29/2019-CPAP compliance report-30 had a last 30 days used, all 30 those days greater than 4 hours, average usage 10 hours and 46 minutes, CPAP set pressure of 11, AHI 3.6  Patient reports that she had a recent flare  of her breathing during cold and rainy weather.  She had to use her rescue Hailer 2-3 times a day during that timeframe.  She is back to her baseline of using her rescue inhaler maybe once or twice a week.  She is also using her Flovent 2 puffs daily.  Patient needs refills of Flovent and rescue inhaler.  Her preferred pharmacy is now [facility] on [address].  No longer [facility].  Observations/Objective:  Sleep tests: PSG 12/01/06>>AHI 10, SpO2 low 80%, PLMI 0  CPAP titration 02/16/07>>CPAP 11 cm H2O   Pulmonary tests Spirometry 06/06/12>>FEV1 1.48 (69%), FVC 1.95 (70%), FEV1% 76 PFT 07/27/12 >> FEV1 1.71 (88%), FEV1% 77, FEF 25-75% 1.44 (64%), TLC 3.58 (77%), DLCO 72%, no BD. Spirometry 10/24/17 >> FEV1 1.15 (63%), FEV1% 83 FeNO 10/24/17 >> 19   Social History   Tobacco Use  Smoking Status Never Smoker  Smokeless Tobacco Never Used   Immunization History  Administered Date(s) Administered  . Influenza Split 03/09/2013  . Influenza Whole 04/15/2012  . Influenza, High Dose Seasonal PF 04/29/2016, 03/07/2018  . Influenza,inj,Quad PF,6+ Mos 04/04/2014  . Influenza-Unspecified 05/04/2017  . PFIZER SARS-COV-2 Vaccination 07/17/2019, 08/06/2019  . Pneumococcal Conjugate-13 04/04/2014  . Pneumococcal Polysaccharide-23 03/05/2009  . Td 11/06/2006  . Zoster 02/02/2013    Assessment and Plan:  Mild persistent asthma Plan:  Continue Flovent HFA  Can use rescue inhaler or nebulizer every 4-6 hours as needed  Can use nasal saline rinses 1-2 times daily Follow-up with our office in 6 months    Allergic rhinitis Did not like astelin nasal spray  No longer on Singulair Occasionally  has nasal congestion Flares with cold and rainy weather  Plan: Can use nasal saline rinses 1-2 times daily to help with nasal congestion Okay to take an occasional Benadryl in the evening to help with symptoms If you are having persistent nasal congestion then would recommend starting a  daily antihistamine   OSA (obstructive sleep apnea) Excellent CPAP compliance  Plan: Continue CPAP therapy Refill supplies Follow-up with our office in 6 to 12 months   Follow Up Instructions:  Return in about 6 months (around 02/28/2020), or if symptoms worsen or fail to improve, for Follow up with Dr. Halford Chessman.   I discussed the assessment and treatment plan with the patient. The patient was provided an opportunity to ask questions and all were answered. The patient agreed with the plan and demonstrated an understanding of the instructions.   The patient was advised to call back or seek an in-person evaluation if the symptoms worsen or if the condition fails to improve as anticipated.  I provided 24 minutes of non-face-to-face time during this encounter.   Lauraine Rinne, NP

## 2019-09-03 NOTE — Progress Notes (Signed)
Reviewed and agree with assessment/plan.   Akacia Boltz, MD [facility] 06/30/2016, 12:24 PM Pager:  [phone]  

## 2019-09-26 ENCOUNTER — Other Ambulatory Visit: Payer: Self-pay | Admitting: Cardiology

## 2019-09-26 DIAGNOSIS — I10 Essential (primary) hypertension: Secondary | ICD-10-CM

## 2019-10-08 ENCOUNTER — Other Ambulatory Visit: Payer: Self-pay | Admitting: Physician Assistant

## 2019-10-08 DIAGNOSIS — E2839 Other primary ovarian failure: Secondary | ICD-10-CM

## 2019-10-09 ENCOUNTER — Telehealth: Payer: Self-pay | Admitting: Pulmonary Disease

## 2019-10-09 ENCOUNTER — Other Ambulatory Visit: Payer: Self-pay | Admitting: Physician Assistant

## 2019-10-09 DIAGNOSIS — Z1231 Encounter for screening mammogram for malignant neoplasm of breast: Secondary | ICD-10-CM

## 2019-10-09 DIAGNOSIS — J4530 Mild persistent asthma, uncomplicated: Secondary | ICD-10-CM

## 2019-10-09 MED ORDER — ALBUTEROL SULFATE HFA 108 (90 BASE) MCG/ACT IN AERS: INHALATION_SPRAY | RESPIRATORY_TRACT | 0 refills | Status: DC

## 2019-10-09 MED ORDER — FLOVENT HFA 110 MCG/ACT IN AERO: INHALATION_SPRAY | RESPIRATORY_TRACT | 1 refills | Status: DC

## 2019-10-09 MED ORDER — ALBUTEROL SULFATE (2.5 MG/3ML) 0.083% IN NEBU: INHALATION_SOLUTION | RESPIRATORY_TRACT | 0 refills | Status: DC

## 2019-10-09 NOTE — Telephone Encounter (Signed)
I called pt to clarify which [facility]. She did not answer the phone and the VM was not set up to leave message. Will try again.

## 2019-10-09 NOTE — Telephone Encounter (Signed)
Spoke with the pt to verify the msg  I have refilled her albuterol inhaler, nebs and flovent  Nothing further needed

## 2019-10-17 ENCOUNTER — Ambulatory Visit: Payer: Medicare HMO | Admitting: Cardiology

## 2019-10-17 ENCOUNTER — Other Ambulatory Visit: Payer: Self-pay

## 2019-10-17 ENCOUNTER — Encounter: Payer: Self-pay | Admitting: Cardiology

## 2019-10-17 VITALS — BP 140/80 | HR 52 | Temp 97.8°F | Resp 17 | Ht 61.0 in | Wt 260.0 lb

## 2019-10-17 DIAGNOSIS — Z6841 Body Mass Index (BMI) 40.0 and over, adult: Secondary | ICD-10-CM | POA: Diagnosis not present

## 2019-10-17 DIAGNOSIS — I10 Essential (primary) hypertension: Secondary | ICD-10-CM | POA: Diagnosis not present

## 2019-10-17 DIAGNOSIS — I5032 Chronic diastolic (congestive) heart failure: Secondary | ICD-10-CM

## 2019-10-17 MED ORDER — HYDRALAZINE HCL 50 MG PO TABS: 50.0000 mg | ORAL_TABLET | Freq: Three times a day (TID) | ORAL | 1 refills | Status: DC

## 2019-10-17 MED ORDER — BYSTOLIC 20 MG PO TABS: 0.5000 | ORAL_TABLET | Freq: Every day | ORAL | 0 refills | Status: DC

## 2019-10-17 NOTE — Progress Notes (Signed)
Primary Physician/Referring:  Chesley Noon, MD  Patient ID: [name], female    DOB: 02-19-1941, 79 y.o.   MRN: [id number]  Chief Complaint  Patient presents with  . Hypertension  . Follow-up    6 month  . Shortness of Breath    HPI: [name]  is a 79 y.o. female  with hypothyroidism, hypertension, depression/anxiety, OSA on CPAP, history of left breast cancer, hypocalcemia, CKD stage 3, presents here for follow-up of hypertension and dyspnea and chronic diastolic heart failure.  She being treated for chronic diastolic heart failure.  Since being on Bystolic, she has done well with improvement in blood pressure and also dyspnea. She presents for a 33-month office visit, states that since being back on Bystolic, she is remained stable.  No recent hospitalization.  She did not tolerate Bisoprolol,  labetalol due to worsening asthma and Aldactone caused worsening renal function. Otherwise denies chest pain, dyspnea has remained stable, no PND or orthopnea.  No new symptomatology.  Past Medical History:  Diagnosis Date  . Anemia    PMH  . Anxiety   . Asthma   . Breast cancer ([facility]) 1988   left breast cancer  . Collagenous colitis   . Compression fracture    lumbar 1  . Hypertension   . Hypothyroidism   . Osteoarthritis   . Osteopenia   . PONV (postoperative nausea and vomiting)   . Sleep apnea    wears CPAP nightly    Past Surgical History:  Procedure Laterality Date  . BREAST REDUCTION SURGERY     Right  . CATARACT EXTRACTION W/ INTRAOCULAR LENS  IMPLANT, BILATERAL    . CESAREAN SECTION    . COLONOSCOPY W/ BIOPSIES AND POLYPECTOMY    . CYST EXCISION N/A 08/30/2018   Procedure: excision of posterior shoulder / back sebaceous cyst;  Surgeon: Wallace Going, DO;  Location: [facility];  Service: Plastics;  Laterality: N/A;  . KYPHOPLASTY N/A 02/18/2016   Procedure: LUMBAR 1 KYPHOPLASTY;  Surgeon: Phylliss Bob, MD;  Location: [facility];   Service: Orthopedics;  Laterality: N/A;  LUMBAR 1 KYPHOPLASTY  . MODIFIED RADICAL MASTECTOMY W/ AXILLARY LYMPH NODE DISSECTION     Left  . RECONSTRUCTION BREAST W/ LATISSIMUS DORSI FLAP     left  . right foot surgery     corrected hammer toe, straightened big toe  . SEPTOPLASTY    . TOE SURGERY    . TONSILLECTOMY AND ADENOIDECTOMY     Social History   Tobacco Use  . Smoking status: Never Smoker  . Smokeless tobacco: Never Used  Substance Use Topics  . Alcohol use: Yes    Alcohol/week: 1.0 standard drinks    Types: 1 Glasses of wine per week    Comment: every night   Marital Status: Married   Review of Systems  Cardiovascular: Positive for dyspnea on exertion and leg swelling. Negative for chest pain.  Gastrointestinal: Negative for melena.    Objective   Vitals with BMI 10/17/2019 04/17/2019 02/08/2019  Height 5\' 1"  5\' 1"  5\' 1"   Weight 260 lbs 262 lbs 264 lbs  BMI 49.15 [id number]  Systolic [id number] 0000000  Diastolic 80 60 57  Pulse 52 62 87    Physical Exam  Constitutional:  Morbidly obese in no acute distress.  Cardiovascular: Normal rate, regular rhythm and normal heart sounds. Exam reveals no gallop.  No murmur heard. Pulses:      Carotid pulses are 2+  on the right side and 2+ on the left side.      Dorsalis pedis pulses are 2+ on the right side and 2+ on the left side.       Posterior tibial pulses are 2+ on the right side and 2+ on the left side.  Femoral and popliteal pulse difficult to feel due to patient's body habitus.  No JVD. 1-2 plus bilateral pitting pedal edema.  Pulmonary/Chest: Effort normal and breath sounds normal.  Abdominal: Soft. Bowel sounds are normal.  Obese. Pannus present    Radiology: No results found.  Laboratory examination:   CMP Latest Ref Rng & Units 12/08/2018 08/25/2018 02/18/2016  Glucose 65 - 99 mg/dL 113(H) 104(H) 103(H)  BUN 8 - 27 mg/dL 19 12 9   Creatinine 0.57 - 1.00 mg/dL 1.01(H) 0.78 0.63  Sodium 134 - 144 mmol/L 142 140  139  Potassium 3.5 - 5.2 mmol/L 3.7 3.4(L) 3.3(L)  Chloride 96 - 106 mmol/L 103 105 107  CO2 20 - 29 mmol/L 22 26 24   Calcium 8.7 - 10.3 mg/dL 9.7 9.4 9.7  Total Protein 6.5 - 8.1 g/dL - - 6.6  Total Bilirubin 0.3 - 1.2 mg/dL - - 0.7  Alkaline Phos 38 - 126 U/L - - 101  AST 15 - 41 U/L - - 34  ALT 14 - 54 U/L - - 42   CBC Latest Ref Rng & Units 02/18/2016 12/17/2014 12/28/2013  WBC 4.0 - 10.5 K/uL 5.9 5.7 5.4  Hemoglobin 12.0 - 15.0 g/dL 13.8 14.0 13.5  Hematocrit 36.0 - 46.0 % 42.5 41.6 41.2  Platelets 150 - 400 K/uL 168 185 169   Lipid Panel  No results found for: CHOL, TRIG, HDL, CHOLHDL, VLDL, LDLCALC, LDLDIRECT HEMOGLOBIN A1C No results found for: HGBA1C, MPG TSH No results for input(s): TSH in the last 8760 hours.   Medications   Medications Discontinued During This Encounter  Medication Reason  . meclizine (ANTIVERT) 25 MG tablet Discontinued by provider  . hydrALAZINE (APRESOLINE) 25 MG tablet Reorder  . BYSTOLIC 20 MG TABS    Current Meds  Medication Sig  . albuterol (PROAIR HFA) 108 (90 Base) MCG/ACT inhaler USE 2 INHALATIONS EVERY 6 HOURS AS NEEDED FOR WHEEZING OR SHORTNESS OF BREATH  . albuterol (PROVENTIL) (2.5 MG/3ML) 0.083% nebulizer solution Take 3 mLs (2.5 mg dose) by nebulization every 6 (six) hours as needed for Wheezing.  . Calcium Citrate-Vitamin D (CITRACAL + D PO) Take 2 tablets by mouth daily.  . clobetasol cream (TEMOVATE) [id number] % Apply 1 application topically daily as needed (irritation).  Marland Kitchen dextromethorphan-guaiFENesin (MUCINEX DM) 30-600 MG per 12 hr tablet Take 1 tablet by mouth every 12 (twelve) hours.  . diclofenac sodium (VOLTAREN) 1 % GEL as needed.  . diltiazem (CARDIZEM CD) 240 MG 24 hr capsule Take 240 mg by mouth daily.  . diphenhydrAMINE (BENADRYL) 25 MG tablet Take 50 mg by mouth 4 (four) times daily.  . fluticasone (FLOVENT HFA) 110 MCG/ACT inhaler USE 2 INHALATIONS DAILY  . furosemide (LASIX) 20 MG tablet Take 20 mg by mouth daily.   .  furosemide (LASIX) 40 MG tablet Take 1 tablet by mouth daily.  Marland Kitchen glucosamine-chondroitin 500-400 MG tablet Take 1 tablet by mouth 2 (two) times daily.  Marland Kitchen levothyroxine (SYNTHROID, LEVOTHROID) 88 MCG tablet Take 88 mcg by mouth daily.  Marland Kitchen losartan (COZAAR) 100 MG tablet Take 100 mg by mouth daily.  . Multiple Vitamins-Minerals (MULTIVITAMIN WITH MINERALS) tablet Take 1 tablet by mouth daily.  Marland Kitchen  naproxen sodium (ALEVE) 220 MG tablet Take 220 mg by mouth daily as needed.  . NON FORMULARY Mega b stress once a day  . Polyethyl Glycol-Propyl Glycol 0.4-0.3 % SOLN 1 drop as needed.  . potassium chloride SA (KLOR-CON) 20 MEQ tablet Take 20 mEq by mouth daily.  . sertraline (ZOLOFT) 100 MG tablet Take 100 mg by mouth 2 (two) times daily.   . [DISCONTINUED] BYSTOLIC 20 MG TABS TAKE 1 TABLET DAILY  . [DISCONTINUED] hydrALAZINE (APRESOLINE) 25 MG tablet Take 25 mg by mouth 3 (three) times daily.  . hydrALAZINE (APRESOLINE) 50 MG tablet Take 1 tablet (50 mg total) by mouth 3 (three) times daily.  . Nebivolol HCl (BYSTOLIC) 20 MG TABS Take 0.5 tablets (10 mg total) by mouth daily.    Cardiac Studies:   Lexiscan Myoview stress test 01/17/2019: Lexiscan stress test was performed. Stress EKG is non-diagnostic, as this is pharmacological stress test. Pharmacological myocardial perfusion imaging is normal. LVEF 69%. Low risk study.    Echocardiogram 12/26/2018 :  Mildly depressed LV systolic function with EF 50%. Left ventricle cavity is normal in size. Moderate concentric remodeling of the left ventricle. Normal global wall motion. Doppler evidence of grade II (pseudonormal) diastolic dysfunction. Diastolic dysfunction findings suggests elevated LA/LV end diastolic pressure. Calculated EF 50%. Left atrial cavity is moderately dilated at 4.5 cm. Structurally normal appearing mitral valve. Mild (Grade I) mitral regurgitation. Structurally normal tricuspid valve with trace regurgitation. Mild pulmonary  hypertension. Estimated pulmonary artery systolic pressure is 33 mm Hg with RA pressure estimated at 8 mm Hg. RVSP measures 34 mmHg. IVC is dilated with respiratory variation. This may suggest elevated right heart pressure  EKG 10/17/2019: Marked sinus bradycardia at rate of 54 bpm with first-degree AV block, normal axis, no evidence of ischemia.  Abnormal EKG. COMPARED TO EKG 11/24/2018: Sinus rhythm/sinus arrhythmia at 64 bpm.  Assessment     ICD-10-CM   1. Chronic diastolic heart failure (HCC)  I50.32 EKG 12-Lead  2. Essential hypertension  I10 Nebivolol HCl (BYSTOLIC) 20 MG TABS    hydrALAZINE (APRESOLINE) 50 MG tablet  3. Class 3 severe obesity due to excess calories with serious comorbidity and body mass index (BMI) of 45.0 to 49.9 in adult [facility])  E66.01    Z68.42    Recommendations:   TAYIA STEIDINGER  is a 79 y.o. female  with hypothyroidism, hypertension, depression/anxiety, OSA on CPAP, history of left breast cancer, hypocalcemia, CKD stage 3, presents here for follow-up of hypertension and dyspnea and chronic diastolic heart failure.  She being treated for chronic diastolic heart failure.  Since being on Bystolic, she has done well with improvement in blood pressure and also dyspnea.  She presents for a 71-month office visit, states that since being back on Bystolic, she is remained stable.  No recent hospitalization.  There is no clinical evidence of heart failure, blood pressure is well controlled, however she has markedly reduced heart rate and also fatigue AV block that is new.  We will reduce the dose of Bystolic to 10 mg daily, will increase hydralazine dose to 50 mg 3 times daily from 25 mg 3 times daily.  I would like to see her back in 6 weeks with repeat EKG.  Although asymptomatic, do not want to see marked bradycardia and first-degree block that is new in a 78 year old female patient.  I have again discussed with her regarding calorie restriction and also watch her salt  intake.    Adrian Prows, MD,  [facility] 10/17/2019, 3:46 PM [facility]. [place] Office: [phone]

## 2019-11-20 DIAGNOSIS — G4733 Obstructive sleep apnea (adult) (pediatric): Secondary | ICD-10-CM | POA: Diagnosis not present

## 2019-11-21 DIAGNOSIS — I10 Essential (primary) hypertension: Secondary | ICD-10-CM | POA: Diagnosis not present

## 2019-11-26 ENCOUNTER — Encounter: Payer: Self-pay | Admitting: Internal Medicine

## 2019-11-26 ENCOUNTER — Other Ambulatory Visit: Payer: Self-pay

## 2019-11-26 ENCOUNTER — Ambulatory Visit (INDEPENDENT_AMBULATORY_CARE_PROVIDER_SITE_OTHER): Payer: Medicare HMO | Admitting: Internal Medicine

## 2019-11-26 ENCOUNTER — Telehealth: Payer: Self-pay | Admitting: Pulmonary Disease

## 2019-11-26 ENCOUNTER — Ambulatory Visit (INDEPENDENT_AMBULATORY_CARE_PROVIDER_SITE_OTHER): Payer: Medicare HMO

## 2019-11-26 VITALS — BP 140/66 | HR 100 | Temp 98.2°F | Ht 62.0 in | Wt 265.0 lb

## 2019-11-26 DIAGNOSIS — J4530 Mild persistent asthma, uncomplicated: Secondary | ICD-10-CM

## 2019-11-26 DIAGNOSIS — I509 Heart failure, unspecified: Secondary | ICD-10-CM

## 2019-11-26 DIAGNOSIS — R0602 Shortness of breath: Secondary | ICD-10-CM | POA: Diagnosis not present

## 2019-11-26 NOTE — Telephone Encounter (Signed)
11/26/2019  Sorry to hear the patient is having the symptoms.  Okay to place order for stat chest x-ray.  Yes okay to schedule with Dr. Shearon Stalls in a 30-minute time slot.    Sounds like likely asthma exacerbation.  Please route the message to Dr. Shearon Stalls.  It needs to be in a 30-minute time slot.  If there is nothing available in a 30-minute time slot please let me know and we will go ahead and empirically treat the patient with steroids or patient can seek emergent evaluation in urgent care.  Wyn Quaker FNP

## 2019-11-26 NOTE — Patient Instructions (Addendum)
The patient should have follow up scheduled with Dr. Halford Chessman in 3 months.    Start taking lasix 80 mg twice a day. (9am and 4pm.) Follow up with Dr. Einar Gip on Thursday as scheduled.

## 2019-11-26 NOTE — Telephone Encounter (Signed)
Called spoke with patient who reports her breathing/SHOB has gotten worse, started with last cold spell where weather got in the 40s.  Increasingly gotten worsen in the past 1-2 weeks and over the weekend   Currently takingFlovent 2puffs BID, ProAir 2puffs 2-3x daily since symptoms began to worsen, began Albuterol neb once daily but stopped because she was not getting any relief  Also taking Mucinex DM with plenty of fluids  Does better when lying down resting, or sitting w/ no talking  Gasping for air with walking to the kitchen or bathroom  She does not own a pulse-ox at home  Does report wheezing and tightness in chest, worse when she's up moving around  Does have allergies with a lot of drainage, did cough some last night but the mucus was clear  Patient is requesting office visit with cxr - last seen 08/31/19  No fever but does report some chills  No known sick contacts   Dr Shearon Stalls does have openings this afternoon if okay for patient to come in.  Patient has received both Covid vaccinations as of 08/2019.  Aaron Edelman please advise, thank you.

## 2019-11-26 NOTE — Telephone Encounter (Signed)
Called spoke with patient 30 minute appt with Dr Shearon Stalls scheduled for today at 95 Covid questions negative Patient has been fully vaccinated Order for STAT cxr placed, to be done prior to ov  Nothing further needed at this time; will sign off.

## 2019-11-26 NOTE — Progress Notes (Signed)
[name]    BU:[id number]    02/19/1941  Primary Care Physician:Spencer, Irish Elders Date of Appointment: 11/26/2019 Established Patient Visit  Chief complaint:   Chief Complaint  Patient presents with  . Acute Visit    sob with exertion for weeks.  Dry hacking cough with post nasal drainage. Allergies.     HPI: [name] is a 79 y.o. woman with history of OSA and allergic rhinitis as well as mild intermittent asthma on albuterol. Patient of Dr. Juanetta Gosling.   Interval Updates: Here today for sick visit. Was hypoxemic to 86% while walking from bathroom and recovered to 93% at rest on [facility].  She describes symptoms worsening when the temperature dropped a few weeks ago and it has gotten worse. Shortness of breath with wheezing, dry hacking cough.  Currently taking flovent 2 puffs BID, albuterol MDI 2-3 times/day (doesn't help for more than an hour.) Nebulizer also not helping. SOB with minimal exertion. Worsening lower extremity edema, abdominal swelling, facial swelling. Decreased appetite. Gaining weight. Currently only on 60 mg daily of lasix. Has had recent changes with her blood pressure medications with Dr. Einar Gip,.    Asthma Control Test ACT Total Score  11/26/2019 9    Using nebulizer 2-3 times a day without relief.   I have reviewed the patient's family social and past medical history and updated as appropriate.   Past Medical History:  Diagnosis Date  . Anemia    PMH  . Anxiety   . Asthma   . Breast cancer ([place]) 1988   left breast cancer  . Collagenous colitis   . Compression fracture    lumbar 1  . Hypertension   . Hypothyroidism   . Osteoarthritis   . Osteopenia   . PONV (postoperative nausea and vomiting)   . Sleep apnea    wears CPAP nightly    Past Surgical History:  Procedure Laterality Date  . BREAST REDUCTION SURGERY     Right  . CATARACT EXTRACTION W/ INTRAOCULAR LENS  IMPLANT, BILATERAL    . CESAREAN SECTION    .  COLONOSCOPY W/ BIOPSIES AND POLYPECTOMY    . CYST EXCISION N/A 08/30/2018   Procedure: excision of posterior shoulder / back sebaceous cyst;  Surgeon: Wallace Going, DO;  Location: [place];  Service: Plastics;  Laterality: N/A;  . KYPHOPLASTY N/A 02/18/2016   Procedure: LUMBAR 1 KYPHOPLASTY;  Surgeon: Phylliss Bob, MD;  Location: [place];  Service: Orthopedics;  Laterality: N/A;  LUMBAR 1 KYPHOPLASTY  . MODIFIED RADICAL MASTECTOMY W/ AXILLARY LYMPH NODE DISSECTION     Left  . RECONSTRUCTION BREAST W/ LATISSIMUS DORSI FLAP     left  . right foot surgery     corrected hammer toe, straightened big toe  . SEPTOPLASTY    . TOE SURGERY    . TONSILLECTOMY AND ADENOIDECTOMY      Family History  Problem Relation Age of Onset  . Lung cancer Mother   . Hypertension Mother   . Ovarian cancer Sister   . Breast cancer Maternal Aunt   . Brain cancer Maternal Aunt   . Breast cancer Other        cousin  . Ovarian cancer Other        cousin  . Kidney cancer Other        cousin  . Kidney cancer Paternal Uncle   . Prostate cancer Paternal Uncle   . Hypertension Father   .  Hypertension Brother     Social History   Occupational History  . Occupation: retired  Tobacco Use  . Smoking status: Never Smoker  . Smokeless tobacco: Never Used  Substance and Sexual Activity  . Alcohol use: Yes    Alcohol/week: 1.0 standard drinks    Types: 1 Glasses of wine per week    Comment: every night  . Drug use: No  . Sexual activity: Not on file   Physical Exam: Blood pressure 140/66, pulse 100, temperature 98.2 F (36.8 C), temperature source Oral, height 5\' 2"  (1.575 m), weight 265 lb (120.2 kg), SpO2 (!) 86 %.  Gen:      No acute distress,  Head:     Periorbital edema Lungs:    No increased respiratory effort, symmetric chest wall excursion, clear to auscultation bilaterally, no wheezes or crackles, diminished breath sounds bilateral bases Abd:       Distended, soft CV:          Regular rate and rhythm; no murmurs, rubs, or gallops. 2+ pitting edema bilaterally   Data Reviewed: Imaging: I have personally reviewed the chest xray from today which shows bilateral pleural effusions and pulmonary vascular congestion.  PFTs: Spirometry April 2019 shows no airflow limitation but suggests restriction.   Labs: Lab Results  Component Value Date   NA 142 12/08/2018   K 3.7 12/08/2018   CL 103 12/08/2018   CO2 22 12/08/2018   Lab Results  Component Value Date   WBC 5.9 02/18/2016   HGB 13.8 02/18/2016   HCT 42.5 02/18/2016   MCV 103.9 (H) 02/18/2016   PLT 168 02/18/2016   Immunization status: Immunization History  Administered Date(s) Administered  . Influenza Split 03/09/2013  . Influenza Whole 04/15/2012  . Influenza, High Dose Seasonal PF 04/29/2016, 03/07/2018, 03/20/2019  . Influenza,inj,Quad PF,6+ Mos 04/04/2014  . Influenza-Unspecified 05/04/2017  . PFIZER SARS-COV-2 Vaccination 07/17/2019, 08/06/2019  . Pneumococcal Conjugate-13 04/04/2014  . Pneumococcal Polysaccharide-23 03/05/2009  . Td 11/06/2006  . Zoster 02/02/2013    Assessment:  CHF exacerbation - acute on chronic diastolic heart failure.  Hypoxemic Respiratory failure  Plan/Recommendations: I do not think her current symptoms are related to asthma exacerbation. She is in decompensated heart failure.  Will Increase lasix to 80 mg BID from 60 mg daily She is seeing cardiology in 4 days and can have labs done with Dr. Einar Gip. She is being started on [facility] today for heart failure. We are setting up home oxygen for her.  If she is not feeling better with increase in diuretics in the next 24 hours I've given her strict cautions to seek emergency care. We discussed possible admission for heart failure exacerbation with IV diuretics if she is not feeling better with increase in lasix.   I spent 60 minutes on 11/26/2019 in care of this patient including face to face time and non-face to face  time spent charting, review of outside records, and coordination of care.  Return to Care: Return in about 3 months (around 02/26/2020). With Dr. Halford Chessman for her asthma.   Lenice Llamas, MD Pulmonary and [place]

## 2019-11-27 ENCOUNTER — Telehealth: Payer: Self-pay | Admitting: Internal Medicine

## 2019-11-27 ENCOUNTER — Telehealth: Payer: Self-pay | Admitting: *Deleted

## 2019-11-27 ENCOUNTER — Other Ambulatory Visit: Payer: Self-pay | Admitting: *Deleted

## 2019-11-27 DIAGNOSIS — J4520 Mild intermittent asthma, uncomplicated: Secondary | ICD-10-CM | POA: Diagnosis not present

## 2019-11-27 DIAGNOSIS — J4530 Mild persistent asthma, uncomplicated: Secondary | ICD-10-CM

## 2019-11-27 DIAGNOSIS — I5020 Unspecified systolic (congestive) heart failure: Secondary | ICD-10-CM | POA: Diagnosis not present

## 2019-11-27 NOTE — Telephone Encounter (Signed)
Spoke with pt. When she was here yesterday for her OV, pt qualified for oxygen. The order for oxygen was sent to [facility]. Pt does not want to use them as she gets her CPAP through [place] and would like all of her equipment coming from the same location. New order has been placed to reflect this. Order going to [facility] has been canceled. Nothing further was needed.

## 2019-11-27 NOTE — Telephone Encounter (Signed)
They are still needing additional information.  [name] has called in to get the addl info and I just routed it to triage as "high priority" so you would know pt is still needing O2.

## 2019-11-27 NOTE — Telephone Encounter (Signed)
Called and spoke with pt about the call received from [place].   Pt stated [facility] was at her house this afternoon 5/25 and stated that Lennette Bihari with [employer] brought the stationary concentrator as well as 6 small tanks and also a backpack for pt.  Pt states she has received all necessary O2 equipment. Nothing further needed.

## 2019-11-27 NOTE — Telephone Encounter (Signed)
Pt wants to use [facility] not [facility] a new order was placed I will close this message and let [facility] know to cancel order

## 2019-11-27 NOTE — Telephone Encounter (Signed)
I sent order to [facility] as "high priority" this morning.  Pt was sent home with tank yesterday because she needed O2.

## 2019-11-27 NOTE — Addendum Note (Signed)
Addended by: Vanessa Barbara on: 11/27/2019 10:07 AM   Modules accepted: Orders

## 2019-11-27 NOTE — Telephone Encounter (Signed)
[name], please advise if you have received a response back from [facility] if this has ben taken care of.

## 2019-11-27 NOTE — Addendum Note (Signed)
Addended by: Vanessa Barbara on: 11/27/2019 10:54 AM   Modules accepted: Orders

## 2019-11-29 NOTE — Progress Notes (Signed)
Primary Physician/Referring:  [name]  Patient ID: [name], female    DOB: April 14, 1941, 79 y.o.   MRN: [id number]  Chief Complaint  Patient presents with  . Follow-up    6 week  . Hypertension   HPI:    [name]  is a 79 y.o. Caucasian female  with hypothyroidism, hypertension, depression/anxiety, OSA on CPAP, history of left breast cancer, hypocalcemia, CKD stage 3. She did not tolerate Bisoprolol,  labetalol due to worsening asthma and Aldactone caused worsening renal function.   Over the past 3 weeks, she has developed worsening dyspnea, leg edema and weight gain.  She was evaluated by pulmonary medicine Dr. Shearon Stalls and furosemide dose increased to 40 mg BID from 60 mg daily.  She was profoundly hypoxemic and she initiated therapy with continuous oxygen.  With this patient is feeling well, has lost about 6 to 7 pounds in weight.  Denies any chest pain.  No dizziness or syncope.  Bystolic dose was reduced by half due to suspicion that it may have triggered CHF.  She has started to feel better, still has dyspnea and fatigue.  Past Medical History:  Diagnosis Date  . Anemia    PMH  . Anxiety   . Asthma   . Breast cancer ([place]) 1988   left breast cancer  . Collagenous colitis   . Compression fracture    lumbar 1  . Hypertension   . Hypothyroidism   . Osteoarthritis   . Osteopenia   . PONV (postoperative nausea and vomiting)   . Sleep apnea    wears CPAP nightly   Past Surgical History:  Procedure Laterality Date  . BREAST REDUCTION SURGERY     Right  . CATARACT EXTRACTION W/ INTRAOCULAR LENS  IMPLANT, BILATERAL    . CESAREAN SECTION    . COLONOSCOPY W/ BIOPSIES AND POLYPECTOMY    . CYST EXCISION N/A 08/30/2018   Procedure: excision of posterior shoulder / back sebaceous cyst;  Surgeon: Wallace Going, DO;  Location: [place];  Service: Plastics;  Laterality: N/A;  . KYPHOPLASTY N/A 02/18/2016   Procedure: LUMBAR 1  KYPHOPLASTY;  Surgeon: Phylliss Bob, MD;  Location: [place];  Service: Orthopedics;  Laterality: N/A;  LUMBAR 1 KYPHOPLASTY  . MODIFIED RADICAL MASTECTOMY W/ AXILLARY LYMPH NODE DISSECTION     Left  . RECONSTRUCTION BREAST W/ LATISSIMUS DORSI FLAP     left  . right foot surgery     corrected hammer toe, straightened big toe  . SEPTOPLASTY    . TOE SURGERY    . TONSILLECTOMY AND ADENOIDECTOMY     Family History  Problem Relation Age of Onset  . Lung cancer Mother   . Hypertension Mother   . Ovarian cancer Sister   . Breast cancer Maternal Aunt   . Brain cancer Maternal Aunt   . Breast cancer Other        cousin  . Ovarian cancer Other        cousin  . Kidney cancer Other        cousin  . Kidney cancer Paternal Uncle   . Prostate cancer Paternal Uncle   . Hypertension Father   . Hypertension Brother     Social History   Tobacco Use  . Smoking status: Never Smoker  . Smokeless tobacco: Never Used  Substance Use Topics  . Alcohol use: Yes    Alcohol/week: 1.0 standard drinks    Types: 1 Glasses of  wine per week    Comment: every night   Marital Status: Married  ROS  Review of Systems  Constitution: Positive for malaise/fatigue.  Cardiovascular: Positive for dyspnea on exertion and leg swelling. Negative for chest pain.  Respiratory: Positive for wheezing.   Gastrointestinal: Negative for melena.   Objective  Blood pressure (!) 109/43, pulse 64, resp. rate 19, height '5\' 2"'  (1.575 m), weight 265 lb (120.2 kg), SpO2 96 %.  Vitals with BMI 11/30/2019 11/30/2019 11/26/2019  Height - '5\' 2"'  '5\' 2"'   Weight - 265 lbs 265 lbs  BMI - 69.62 95.28  Systolic 413 244 010  Diastolic 43 42 66  Pulse - 64 100     Physical Exam  Constitutional: She appears well-developed and well-nourished. No distress.  Cardiovascular: Normal rate, regular rhythm, intact distal pulses and normal pulses. Exam reveals no gallop.  No murmur heard. Pulses:      Carotid pulses are 2+ on the right side  and 2+ on the left side.      Dorsalis pedis pulses are 2+ on the right side and 2+ on the left side.       Posterior tibial pulses are 2+ on the right side and 2+ on the left side.  2+ bilateral leg edema. JVD difficult to evaluate due to short neck.  Pulmonary/Chest: Effort normal and breath sounds normal. No accessory muscle usage.  Abdominal: Soft. Bowel sounds are normal.   Laboratory examination:   Recent Labs    12/08/18 1326  NA 142  K 3.7  CL 103  CO2 22  GLUCOSE 113*  BUN 19  CREATININE 1.01*  CALCIUM 9.7  GFRNONAA 55*  GFRAA 63   CrCl cannot be calculated (Patient's most recent lab result is older than the maximum 21 days allowed.).  CMP Latest Ref Rng & Units 12/08/2018 08/25/2018 02/18/2016  Glucose 65 - 99 mg/dL 113(H) 104(H) 103(H)  BUN 8 - 27 mg/dL '19 12 9  ' Creatinine 0.57 - 1.00 mg/dL 1.01(H) 0.78 0.63  Sodium 134 - 144 mmol/L 142 140 139  Potassium 3.5 - 5.2 mmol/L 3.7 3.4(L) 3.3(L)  Chloride 96 - 106 mmol/L 103 105 107  CO2 20 - 29 mmol/L '22 26 24  ' Calcium 8.7 - 10.3 mg/dL 9.7 9.4 9.7  Total Protein 6.5 - 8.1 g/dL - - 6.6  Total Bilirubin 0.3 - 1.2 mg/dL - - 0.7  Alkaline Phos 38 - 126 U/L - - 101  AST 15 - 41 U/L - - 34  ALT 14 - 54 U/L - - 42   CBC Latest Ref Rng & Units 02/18/2016 12/17/2014 12/28/2013  WBC 4.0 - 10.5 K/uL 5.9 5.7 5.4  Hemoglobin 12.0 - 15.0 g/dL 13.8 14.0 13.5  Hematocrit 36.0 - 46.0 % 42.5 41.6 41.2  Platelets 150 - 400 K/uL 168 185 169   Lipid Panel  No results found for: CHOL, TRIG, HDL, CHOLHDL, VLDL, LDLCALC, LDLDIRECT HEMOGLOBIN A1C No results found for: HGBA1C, MPG TSH No results for input(s): TSH in the last 8760 hours.  External labs:   Care Everywhere Result Report   Labs 03/21/2019:  BUN 22 creatinine 0.97, EGFR 57 mL, sodium 144, potassium 3.9.  Total cholesterol 185, triglycerides 126, HDL 87, LDL 77.  Medications and allergies   Allergies  Allergen Reactions  . Ibuprofen Swelling    Patient reports  swelling of legs   . Adhesive [Tape] Other (See Comments)    UNSPECIFIED   . Bactrim [Sulfamethoxazole-Trimethoprim] Other (See Comments)  UNSPECIFIED   . Dairy Aid [Lactase] Other (See Comments)    Ice cream, sour cream  . Sulfa Antibiotics Other (See Comments)    UNSPECIFIED   . Meloxicam Nausea Only     Current Outpatient Medications  Medication Instructions  . albuterol (PROAIR HFA) 108 (90 Base) MCG/ACT inhaler USE 2 INHALATIONS EVERY 6 HOURS AS NEEDED FOR WHEEZING OR SHORTNESS OF BREATH  . albuterol (PROVENTIL) (2.5 MG/3ML) 0.083% nebulizer solution Take 3 mLs (2.5 mg dose) by nebulization every 6 (six) hours as needed for Wheezing.  . Calcium Citrate-Vitamin D (CITRACAL + D PO) 2 tablets, Oral, Daily  . clobetasol cream (TEMOVATE) 0.30 % 1 application, Topical, Daily PRN  . dextromethorphan-guaiFENesin (MUCINEX DM) 30-600 MG per 12 hr tablet 1 tablet, Oral, Every 12 hours  . diclofenac sodium (VOLTAREN) 1 % GEL as needed.  . diltiazem (CARDIZEM CD) 240 mg, Oral, Daily  . fluticasone (FLOVENT HFA) 110 MCG/ACT inhaler USE 2 INHALATIONS DAILY  . furosemide (LASIX) 40 MG tablet 1 tablet, Oral, 2 times daily  . glucosamine-chondroitin 500-400 MG tablet 1 tablet, Oral, 4 times daily, Patient taking 4 times daily.  . hydrALAZINE (APRESOLINE) 50 mg, Oral, 3 times daily  . levothyroxine (SYNTHROID) 88 mcg, Oral, Daily  . losartan (COZAAR) 100 mg, Oral, Daily  . Multiple Vitamins-Minerals (MULTIVITAMIN WITH MINERALS) tablet 1 tablet, Oral, Daily  . naproxen sodium (ALEVE) 220 mg, Oral, Daily PRN  . NON FORMULARY Mega b stress once a day  . Polyethyl Glycol-Propyl Glycol 0.4-0.3 % SOLN 1 drop as needed.  . potassium chloride SA (KLOR-CON) 20 MEQ tablet 20 mEq, Oral, Daily  . sertraline (ZOLOFT) 100 mg, Oral, 2 times daily   Radiology:   No results found.  Cardiac Studies:   Lexiscan Myoview stress test 01/17/2019: Lexiscan stress test was performed. Stress EKG is  non-diagnostic, as this is pharmacological stress test. Pharmacological myocardial perfusion imaging is normal. LVEF 69%. Low risk study.    Echocardiogram 12/26/2018 :  Mildly depressed LV systolic function with EF 50%. Left ventricle cavity is normal in size. Moderate concentric remodeling of the left ventricle. Normal global wall motion. Doppler evidence of grade II (pseudonormal) diastolic dysfunction. Diastolic dysfunction findings suggests elevated LA/LV end diastolic pressure. Calculated EF 50%. Left atrial cavity is moderately dilated at 4.5 cm. Structurally normal appearing mitral valve. Mild (Grade I) mitral regurgitation. Structurally normal tricuspid valve with trace regurgitation. Mild pulmonary hypertension. Estimated pulmonary artery systolic pressure is 33 mm Hg with RA pressure estimated at 8 mm Hg. RVSP measures 34 mmHg. IVC is dilated with respiratory variation. This may suggest elevated right heart pressure  EKG    10/17/2019: Marked sinus bradycardia at rate of 54 bpm with first-degree AV block, normal axis, no evidence of ischemia.  Abnormal EKG. COMPARED TO EKG 11/24/2018: Sinus rhythm/sinus arrhythmia at 64 bpm.  Assessment     ICD-10-CM   1. Acute on chronic diastolic heart failure (HCC)  S92.33 Basic metabolic panel    Brain natriuretic peptide    PCV ECHOCARDIOGRAM COMPLETE    Brain natriuretic peptide    Basic metabolic panel    Magnesium  2. Essential hypertension  I10 TSH  3. Shortness of breath  R06.02 PCV ECHOCARDIOGRAM COMPLETE  4. Class 3 severe obesity due to excess calories with serious comorbidity and body mass index (BMI) of 45.0 to 49.9 in adult (HCC)  E66.01    Z68.42      No orders of the defined types were placed in this  encounter.   Medications Discontinued During This Encounter  Medication Reason  . furosemide (LASIX) 20 MG tablet Error  . Nebivolol HCl (BYSTOLIC) 20 MG TABS Discontinued by provider   Will hold Bystolic for now and if BP  increases, restart Recommendations:   [name]  is a  78 y.o.  female  with hypothyroidism, hypertension, depression/anxiety, OSA on CPAP, history of left breast cancer, hypocalcemia, CKD stage 3, presents here for follow-up of hypertension and dyspnea and chronic diastolic heart failure.  She being treated for chronic diastolic heart failure.  Since being on Bystolic, she has done well with improvement in blood pressure and also dyspnea.   Over the past 3 weeks, she has developed worsening dyspnea, leg edema and weight gain.  She was evaluated by pulmonary medicine Dr. Shearon Stalls and furosemide dose increased to 40 mg BID from 60 mg daily.  She still has significant amount of fluid overload state, I do not think Bystolic had anything to do with this but her poor eating habits.  As her blood pressure has improved since being on hydralazine, I will discontinue Bystolic for now.  Will obtain BMP along with BNP and magnesium levels today and again in 2 weeks and I would like to see her back in 2 weeks.  I would like to repeat an echocardiogram to reevaluate her LV systolic function.  Will enroll the patient in Remote Patient Monitoring and Principal Care Management as patient is high risk for hospitalization and complications from underlying medical conditions.    Adrian Prows, MD, [facility] 11/30/2019, 11:50 AM [facility]. [place] Pager: [phone] Office: [phone]  CC: Drs. Delane Ginger. Shearon Stalls, B. Balan.

## 2019-11-30 ENCOUNTER — Encounter: Payer: Self-pay | Admitting: Cardiology

## 2019-11-30 ENCOUNTER — Other Ambulatory Visit: Payer: Self-pay

## 2019-11-30 ENCOUNTER — Ambulatory Visit: Payer: Medicare HMO | Admitting: Cardiology

## 2019-11-30 VITALS — BP 109/43 | HR 64 | Resp 19 | Ht 62.0 in | Wt 265.0 lb

## 2019-11-30 DIAGNOSIS — I5033 Acute on chronic diastolic (congestive) heart failure: Secondary | ICD-10-CM

## 2019-11-30 DIAGNOSIS — R0602 Shortness of breath: Secondary | ICD-10-CM

## 2019-11-30 DIAGNOSIS — Z6841 Body Mass Index (BMI) 40.0 and over, adult: Secondary | ICD-10-CM | POA: Diagnosis not present

## 2019-11-30 DIAGNOSIS — I10 Essential (primary) hypertension: Secondary | ICD-10-CM

## 2019-12-01 LAB — BRAIN NATRIURETIC PEPTIDE: BNP: 177 pg/mL — ABNORMAL HIGH (ref 0.0–100.0)

## 2019-12-01 LAB — BASIC METABOLIC PANEL
BUN/Creatinine Ratio: 24 (ref 12–28)
BUN: 24 mg/dL (ref 8–27)
CO2: 24 mmol/L (ref 20–29)
Calcium: 10.2 mg/dL (ref 8.7–10.3)
Chloride: 104 mmol/L (ref 96–106)
Creatinine, Ser: 1.02 mg/dL — ABNORMAL HIGH (ref 0.57–1.00)
GFR calc Af Amer: 62 mL/min/{1.73_m2} (ref >59)
GFR calc non Af Amer: 54 mL/min/{1.73_m2} — ABNORMAL LOW (ref >59)
Glucose: 107 mg/dL — ABNORMAL HIGH (ref 65–99)
Potassium: 3.8 mmol/L (ref 3.5–5.2)
Sodium: 145 mmol/L — ABNORMAL HIGH (ref 134–144)

## 2019-12-01 LAB — TSH: TSH: 4.72 u[IU]/mL — ABNORMAL HIGH (ref 0.450–4.500)

## 2019-12-01 NOTE — Progress Notes (Signed)
TSH mildly abnormal, otherwise stable renal function and K levels and Mg levels.

## 2019-12-03 DIAGNOSIS — I5033 Acute on chronic diastolic (congestive) heart failure: Secondary | ICD-10-CM | POA: Diagnosis not present

## 2019-12-07 ENCOUNTER — Other Ambulatory Visit: Payer: Self-pay

## 2019-12-07 ENCOUNTER — Ambulatory Visit: Payer: Medicare HMO

## 2019-12-07 DIAGNOSIS — R0602 Shortness of breath: Secondary | ICD-10-CM

## 2019-12-07 DIAGNOSIS — I5033 Acute on chronic diastolic (congestive) heart failure: Secondary | ICD-10-CM | POA: Diagnosis not present

## 2019-12-12 NOTE — Progress Notes (Signed)
Primary Physician/Referring:  [name]  Patient ID: [name], female    DOB: 1940-12-17, 79 y.o.   MRN: [id number]  Chief Complaint  Patient presents with  . Shortness of Breath  . Chronic Diastolic Heart Failure  . Hypertension  . Follow-up    2 week   HPI:    NALLA PURDY  is a 79 y.o. Caucasian female  with hypothyroidism, hypertension, depression/anxiety, OSA on CPAP, history of left breast cancer, hypocalcemia, CKD stage 3. She did not tolerate Bisoprolol,  labetalol due to worsening asthma and Aldactone caused worsening renal function.   Patient is here for 2-week visit for follow-up of chronic diastolic heart failure.  Since increasing the dose of furosemide to 40 mg p.o. twice daily she started feeling better, she is also lost about 6 to 7 pounds in weight, leg edema has essentially resolved.  She is wondering whether she should continue oxygen or not.  No chest pain, no dizziness, no palpitations.  Past Medical History:  Diagnosis Date  . Anemia    PMH  . Anxiety   . Asthma   . Breast cancer ([place]) 1988   left breast cancer  . Collagenous colitis   . Compression fracture    lumbar 1  . Hypertension   . Hypothyroidism   . Osteoarthritis   . Osteopenia   . PONV (postoperative nausea and vomiting)   . Sleep apnea    wears CPAP nightly   Past Surgical History:  Procedure Laterality Date  . BREAST REDUCTION SURGERY     Right  . CATARACT EXTRACTION W/ INTRAOCULAR LENS  IMPLANT, BILATERAL    . CESAREAN SECTION    . COLONOSCOPY W/ BIOPSIES AND POLYPECTOMY    . CYST EXCISION N/A 08/30/2018   Procedure: excision of posterior shoulder / back sebaceous cyst;  Surgeon: Wallace Going, DO;  Location: [facility];  Service: Plastics;  Laterality: N/A;  . KYPHOPLASTY N/A 02/18/2016   Procedure: LUMBAR 1 KYPHOPLASTY;  Surgeon: Phylliss Bob, MD;  Location: [facility];  Service: Orthopedics;  Laterality: N/A;  LUMBAR 1 KYPHOPLASTY  .  MODIFIED RADICAL MASTECTOMY W/ AXILLARY LYMPH NODE DISSECTION     Left  . RECONSTRUCTION BREAST W/ LATISSIMUS DORSI FLAP     left  . right foot surgery     corrected hammer toe, straightened big toe  . SEPTOPLASTY    . TOE SURGERY    . TONSILLECTOMY AND ADENOIDECTOMY     Family History  Problem Relation Age of Onset  . Lung cancer Mother   . Hypertension Mother   . Ovarian cancer Sister   . Breast cancer Maternal Aunt   . Brain cancer Maternal Aunt   . Breast cancer Other        cousin  . Ovarian cancer Other        cousin  . Kidney cancer Other        cousin  . Kidney cancer Paternal Uncle   . Prostate cancer Paternal Uncle   . Hypertension Father   . Hypertension Brother     Social History   Tobacco Use  . Smoking status: Never Smoker  . Smokeless tobacco: Never Used  Substance Use Topics  . Alcohol use: Yes    Alcohol/week: 1.0 standard drink    Types: 1 Glasses of wine per week    Comment: every night   Marital Status: Married  ROS  Review of Systems  Cardiovascular: Positive for dyspnea on  exertion (improved) and leg swelling (essentially resolved). Negative for chest pain.  Respiratory: Negative for wheezing.   Gastrointestinal: Negative for melena.   Objective  Blood pressure (!) 123/53, pulse 75, resp. rate 16, height '5\' 2"'  (1.575 m), weight 257 lb 14.4 oz (117 kg), SpO2 97 %.  Vitals with BMI 12/13/2019 11/30/2019 11/30/2019  Height '5\' 2"'  - '5\' 2"'   Weight 257 lbs 14 oz - 265 lbs  BMI 63.84 - 53.64  Systolic 680 321 224  Diastolic 53 43 42  Pulse 75 - 64     Physical Exam Constitutional:      General: She is not in acute distress.    Appearance: She is well-developed and well-nourished.  Cardiovascular:     Rate and Rhythm: Normal rate and regular rhythm.     Pulses: Normal pulses and intact distal pulses.          Carotid pulses are 2+ on the right side and 2+ on the left side.      Dorsalis pedis pulses are 2+ on the right side and 2+ on the left  side.       Posterior tibial pulses are 2+ on the right side and 2+ on the left side.     Heart sounds: No murmur heard.  No gallop.      Comments: 1+ bilateral leg edema. JVD difficult to evaluate due to short neck. Pulmonary:     Effort: Pulmonary effort is normal. No accessory muscle usage.     Breath sounds: Normal breath sounds.  Abdominal:     General: Bowel sounds are normal.     Palpations: Abdomen is soft.    Laboratory examination:   Recent Labs    11/30/19 1344  NA 145*  K 3.8  CL 104  CO2 24  GLUCOSE 107*  BUN 24  CREATININE 1.02*  CALCIUM 10.2  GFRNONAA 54*  GFRAA 62   estimated creatinine clearance is 57 mL/min (A) (by C-G formula based on SCr of 1.02 mg/dL (H)).  CMP Latest Ref Rng & Units 11/30/2019 12/08/2018 08/25/2018  Glucose 65 - 99 mg/dL 107(H) 113(H) 104(H)  BUN 8 - 27 mg/dL '24 19 12  ' Creatinine 0.57 - 1.00 mg/dL 1.02(H) 1.01(H) 0.78  Sodium 134 - 144 mmol/L 145(H) 142 140  Potassium 3.5 - 5.2 mmol/L 3.8 3.7 3.4(L)  Chloride 96 - 106 mmol/L 104 103 105  CO2 20 - 29 mmol/L '24 22 26  ' Calcium 8.7 - 10.3 mg/dL 10.2 9.7 9.4  Total Protein 6.5 - 8.1 g/dL - - -  Total Bilirubin 0.3 - 1.2 mg/dL - - -  Alkaline Phos 38 - 126 U/L - - -  AST 15 - 41 U/L - - -  ALT 14 - 54 U/L - - -   CBC Latest Ref Rng & Units 02/18/2016 12/17/2014 12/28/2013  WBC 4.0 - 10.5 K/uL 5.9 5.7 5.4  Hemoglobin 12.0 - 15.0 g/dL 13.8 14.0 13.5  Hematocrit 36 - 46 % 42.5 41.6 41.2  Platelets 150 - 400 K/uL 168 185 169   Lipid Panel  No results found for: CHOL, TRIG, HDL, CHOLHDL, VLDL, LDLCALC, LDLDIRECT HEMOGLOBIN A1C No results found for: HGBA1C, MPG TSH Recent Labs    11/30/19 1345  TSH 4.720*   BNP (last 3 results) Recent Labs    11/30/19 1342  BNP 177.0*    Care Everywhere Result Report  03/21/2019: BUN 22 creatinine 0.97, EGFR 57 mL, sodium 144, potassium 3.9. Total cholesterol 185, triglycerides 126, HDL  87, LDL 77.  Medications and allergies   Allergies   Allergen Reactions  . Ibuprofen Swelling    Patient reports swelling of legs   . Adhesive [Tape] Other (See Comments)    UNSPECIFIED   . Bactrim [Sulfamethoxazole-Trimethoprim] Other (See Comments)    UNSPECIFIED   . Dairy Aid [Lactase] Other (See Comments)    Ice cream, sour cream  . Sulfa Antibiotics Other (See Comments)    UNSPECIFIED   . Meloxicam Nausea Only     Current Outpatient Medications  Medication Instructions  . albuterol (PROAIR HFA) 108 (90 Base) MCG/ACT inhaler USE 2 INHALATIONS EVERY 6 HOURS AS NEEDED FOR WHEEZING OR SHORTNESS OF BREATH  . albuterol (PROVENTIL) (2.5 MG/3ML) 0.083% nebulizer solution Take 3 mLs (2.5 mg dose) by nebulization every 6 (six) hours as needed for Wheezing.  . Calcium Citrate-Vitamin D (CITRACAL + D PO) 2 tablets, Oral, Daily  . clobetasol cream (TEMOVATE) 9.73 % 1 application, Topical, Daily PRN  . dextromethorphan-guaiFENesin (MUCINEX DM) 30-600 MG per 12 hr tablet 1 tablet, Oral, Every 12 hours  . diclofenac sodium (VOLTAREN) 1 % GEL as needed.  . diltiazem (CARDIZEM CD) 240 mg, Oral, Daily  . fluticasone (FLOVENT HFA) 110 MCG/ACT inhaler USE 2 INHALATIONS DAILY  . furosemide (LASIX) 40 MG tablet 1 tablet, Oral, Daily, Can take additional dose if weigh increase >2 lbs in 2 days  . glucosamine-chondroitin 500-400 MG tablet 1 tablet, Oral, 4 times daily, Patient taking 4 times daily.  . hydrALAZINE (APRESOLINE) 50 mg, Oral, 3 times daily  . levothyroxine (SYNTHROID) 88 mcg, Oral, Daily  . losartan (COZAAR) 100 mg, Oral, Daily  . Multiple Vitamins-Minerals (MULTIVITAMIN WITH MINERALS) tablet 1 tablet, Oral, Daily  . naproxen sodium (ALEVE) 220 mg, Oral, Daily PRN  . NON FORMULARY Mega b stress once a day  . Polyethyl Glycol-Propyl Glycol 0.4-0.3 % SOLN 1 drop as needed.  . potassium chloride SA (KLOR-CON) 20 MEQ tablet 20 mEq, Oral, Daily  . sertraline (ZOLOFT) 100 mg, Oral, 2 times daily   There are no discontinued  medications.  Radiology:   No results found.  Cardiac Studies:    Lexiscan Myoview stress test 01/17/2019: Lexiscan stress test was performed. Stress EKG is non-diagnostic, as this is pharmacological stress test. Pharmacological myocardial perfusion imaging is normal. LVEF 69%. Low risk study.   Echocardiogram 12/07/2019:  Left ventricle cavity is normal in size. Moderate concentric hypertrophy  of the left ventricle. Normal global wall motion. Normal LV systolic  function with EF 57%. Doppler evidence of grade II (pseudonormal)  diastolic dysfunction, elevated LAP.  Left atrial cavity is mildly dilated.  Mild (Grade I) mitral regurgitation.  Mild tricuspid regurgitation. RA-RV gradient 20 mmHg.  No significant change compared to previous study on 12/26/2018.   Six Minute Walk - 12/13/19 0939      Six Minute Walk   Supplemental oxygen during test? No    Lap distance in meters  20 meters    Laps Completed  10    Partial lap (in meters) 0 meters    Baseline Heartrate 73    Baseline SPO2 98 %      Interval Oxygen Saturation and HR    2 Minute Oxygen Saturation % 94 %    2 Minute HR 88    4 Minute Oxygen Saturation % 95 %    4 Minute HR 98    6 Minute Oxygen Saturation % 94 %    6 Minute HR 101  End of Test Values   Heartrate 91    SPO2 92 %      2 Minutes Post Walk Values   Stopped or paused before six minutes? No      Interpretation   Distance completed 200 meters    Tech Comments: Patient did not stop until laps completed, but struggled a little more at the end.            EKG    10/17/2019: Marked sinus bradycardia at rate of 54 bpm with first-degree AV block, normal axis, no evidence of ischemia.  Abnormal EKG. COMPARED TO EKG 11/24/2018: Sinus rhythm/sinus arrhythmia at 64 bpm.  Assessment     ICD-10-CM   1. Chronic diastolic heart failure (HCC)  I50.32 6 minute walk  2. Essential hypertension  I10   3. Shortness of breath  R06.02   4. Leg edema   R60.0      Recommendations:   AVEENA BARI  is a  79 y.o.  female  with hypothyroidism, hypertension, depression/anxiety, OSA on CPAP, history of left breast cancer, hypocalcemia, CKD stage 3, presents here for follow-up of hypertension and dyspnea and chronic diastolic heart failure.  She being treated for chronic diastolic heart failure.  Since being on Bystolic, she has done well with improvement in blood pressure and also dyspnea.   She is here on a 2-week visit for acute diastolic heart failure, since making diet changes and also increasing the dose of furosemide, leg edema has completely resolved, she has lost about 6 pounds in weight and dyspnea has also improved.  We did 6-minute walk test today, oxygen saturations were relatively well-preserved and the lowest SPO2 was 92%.  As heart failure symptoms are significantly improved, she could potentially come off of oxygen during the daytime.  I have advised her to continue oxygen for at least 3-4 more weeks. I will see her then  I will request Dr. Shearon Stalls to see whether we should perform nocturnal oximetry and if she indeed has hypoxemia at night she should continue with home oxygen.  Otherwise this can be discontinued.  Would like to see her back in 4 weeks.  Blood pressure well controlled on present medical regimen, Bystolic has been discontinued, she is tolerating hydralazine without any side effects. Echo reviewed, no significant change from before but does have grade II diastolic dysfunction.   She is having frequency of stools, I suspect sertraline to be the etiology for the same.  She will discuss with her PCP regarding making changes.   Adrian Prows, MD, [facility] 12/13/2019, 9:47 AM [facility] Pager: [phone] Office: [phone]  CC: Drs. Delane Ginger. Shearon Stalls, B. Balan.

## 2019-12-13 ENCOUNTER — Ambulatory Visit: Payer: Medicare HMO | Admitting: Cardiology

## 2019-12-13 ENCOUNTER — Encounter: Payer: Self-pay | Admitting: Cardiology

## 2019-12-13 ENCOUNTER — Other Ambulatory Visit: Payer: Self-pay

## 2019-12-13 VITALS — BP 123/53 | HR 75 | Resp 16 | Ht 62.0 in | Wt 257.9 lb

## 2019-12-13 DIAGNOSIS — I10 Essential (primary) hypertension: Secondary | ICD-10-CM

## 2019-12-13 DIAGNOSIS — R600 Localized edema: Secondary | ICD-10-CM

## 2019-12-13 DIAGNOSIS — I5032 Chronic diastolic (congestive) heart failure: Secondary | ICD-10-CM

## 2019-12-13 DIAGNOSIS — R0602 Shortness of breath: Secondary | ICD-10-CM | POA: Diagnosis not present

## 2019-12-18 ENCOUNTER — Telehealth: Payer: Self-pay | Admitting: Pulmonary Disease

## 2019-12-18 DIAGNOSIS — R0609 Other forms of dyspnea: Secondary | ICD-10-CM

## 2019-12-18 NOTE — Telephone Encounter (Signed)
Spoke with pt, she would like Dr. Halford Chessman to review Dr. Irven Shelling notes and her 6 minute walk to determine if she needs oxygen or not. Dr. Einar Gip didn't wasn't to d/c oxygen because he didn't put her on it originally. Dr. Halford Chessman please advise.

## 2019-12-19 NOTE — Telephone Encounter (Signed)
Reviewed information regarding 6 minute walk test performed at Dr. Irven Shelling office on 12/13/19.  She maintained SpO2 > 90% on room air.  She was started on supplemental oxygen on 11/26/19 during CHF exacerbation which has improved.  Okay to send order to discontinue home oxygen setup.

## 2019-12-19 NOTE — Telephone Encounter (Signed)
Spoke with patient. She is aware that Dr. Halford Chessman is ok with D/Cing her O2. She confirmed that she has O2 through [facility].   Will go ahead and place the order.   Nothing further needed at time of call.

## 2019-12-25 ENCOUNTER — Other Ambulatory Visit: Payer: Self-pay

## 2019-12-25 ENCOUNTER — Ambulatory Visit
Admission: RE | Admit: 2019-12-25 | Discharge: 2019-12-25 | Disposition: A | Discharge status: Home / Self Care | Payer: Medicare HMO | Source: Ambulatory Visit | Attending: Physician Assistant | Admitting: Physician Assistant

## 2019-12-25 DIAGNOSIS — Z1231 Encounter for screening mammogram for malignant neoplasm of breast: Secondary | ICD-10-CM

## 2019-12-25 DIAGNOSIS — E2839 Other primary ovarian failure: Secondary | ICD-10-CM

## 2019-12-25 DIAGNOSIS — M8589 Other specified disorders of bone density and structure, multiple sites: Secondary | ICD-10-CM | POA: Diagnosis not present

## 2019-12-25 DIAGNOSIS — Z780 Asymptomatic menopausal state: Secondary | ICD-10-CM | POA: Diagnosis not present

## 2019-12-26 DIAGNOSIS — M810 Age-related osteoporosis without current pathological fracture: Secondary | ICD-10-CM | POA: Diagnosis not present

## 2019-12-26 DIAGNOSIS — E039 Hypothyroidism, unspecified: Secondary | ICD-10-CM | POA: Diagnosis not present

## 2019-12-26 DIAGNOSIS — I10 Essential (primary) hypertension: Secondary | ICD-10-CM | POA: Diagnosis not present

## 2020-01-02 DIAGNOSIS — I5033 Acute on chronic diastolic (congestive) heart failure: Secondary | ICD-10-CM | POA: Diagnosis not present

## 2020-01-10 ENCOUNTER — Ambulatory Visit: Payer: Medicare HMO | Admitting: Cardiology

## 2020-01-10 ENCOUNTER — Other Ambulatory Visit: Payer: Self-pay

## 2020-01-10 ENCOUNTER — Encounter: Payer: Self-pay | Admitting: Cardiology

## 2020-01-10 VITALS — BP 136/55 | HR 71 | Resp 16 | Ht 62.0 in | Wt 260.4 lb

## 2020-01-10 DIAGNOSIS — I5032 Chronic diastolic (congestive) heart failure: Secondary | ICD-10-CM

## 2020-01-10 DIAGNOSIS — R0602 Shortness of breath: Secondary | ICD-10-CM | POA: Diagnosis not present

## 2020-01-10 DIAGNOSIS — I10 Essential (primary) hypertension: Secondary | ICD-10-CM

## 2020-01-10 NOTE — Progress Notes (Signed)
Primary Physician/Referring:  [name]  Patient ID: [name], female    DOB: [birth year], 79 y.o.   MRN: [id number]  Chief Complaint  Patient presents with  . Congestive Heart Failure  . Follow-up    4 weeks   HPI:    [name]  is a 79 y.o. Caucasian female  with hypothyroidism, hypertension, depression/anxiety, OSA on CPAP, history of left breast cancer, hypocalcemia, CKD stage 3. She did not tolerate Bisoprolol,  labetalol due to worsening asthma and Aldactone caused worsening renal function.   She is on 60 mg of lasix daily. States she is feeling the best in a while.   Past Medical History:  Diagnosis Date  . Anemia    PMH  . Anxiety   . Asthma   . Breast cancer ([place]) 1988   left breast cancer  . Collagenous colitis   . Compression fracture    lumbar 1  . Hypertension   . Hypothyroidism   . Osteoarthritis   . Osteopenia   . PONV (postoperative nausea and vomiting)   . Sleep apnea    wears CPAP nightly   Past Surgical History:  Procedure Laterality Date  . BREAST REDUCTION SURGERY     Right  . CATARACT EXTRACTION W/ INTRAOCULAR LENS  IMPLANT, BILATERAL    . CESAREAN SECTION    . COLONOSCOPY W/ BIOPSIES AND POLYPECTOMY    . CYST EXCISION N/A 08/30/2018   Procedure: excision of posterior shoulder / back sebaceous cyst;  Surgeon: Wallace Going, DO;  Location: [facility];  Service: Plastics;  Laterality: N/A;  . KYPHOPLASTY N/A 02/18/2016   Procedure: LUMBAR 1 KYPHOPLASTY;  Surgeon: Phylliss Bob, MD;  Location: [facility];  Service: Orthopedics;  Laterality: N/A;  LUMBAR 1 KYPHOPLASTY  . MODIFIED RADICAL MASTECTOMY W/ AXILLARY LYMPH NODE DISSECTION     Left  . RECONSTRUCTION BREAST W/ LATISSIMUS DORSI FLAP     left  . REDUCTION MAMMAPLASTY Right   . right foot surgery     corrected hammer toe, straightened big toe  . SEPTOPLASTY    . TOE SURGERY    . TONSILLECTOMY AND ADENOIDECTOMY     Family History  Problem  Relation Age of Onset  . Lung cancer Mother   . Hypertension Mother   . Ovarian cancer Sister   . Breast cancer Maternal Aunt   . Brain cancer Maternal Aunt   . Breast cancer Other        cousin  . Ovarian cancer Other        cousin  . Kidney cancer Other        cousin  . Kidney cancer Paternal Uncle   . Prostate cancer Paternal Uncle   . Hypertension Father   . Hypertension Brother     Social History   Tobacco Use  . Smoking status: Never Smoker  . Smokeless tobacco: Never Used  Substance Use Topics  . Alcohol use: Yes    Alcohol/week: 1.0 standard drink    Types: 1 Glasses of wine per week    Comment: every night   Marital Status: Married  ROS  Review of Systems  Cardiovascular: Positive for dyspnea on exertion (improved) and leg swelling (essentially resolved). Negative for chest pain.  Respiratory: Negative for wheezing.   Gastrointestinal: Negative for melena.   Objective  Blood pressure (!) 136/55, pulse 71, resp. rate 16, height '5\' 2"'  (1.575 m), weight 260 lb 6.4 oz (118.1 kg), SpO2 95 %.  Vitals with BMI 01/10/2020 12/13/2019 11/30/2019  Height '5\' 2"'  '5\' 2"'  -  Weight 260 lbs 6 oz 257 lbs 14 oz -  BMI 62.94 76.54 -  Systolic 650 354 656  Diastolic 55 53 43  Pulse 71 75 -     Physical Exam Constitutional:      General: She is not in acute distress.    Appearance: She is well-developed.  Cardiovascular:     Rate and Rhythm: Normal rate and regular rhythm.     Pulses: Normal pulses and intact distal pulses.          Carotid pulses are 2+ on the right side and 2+ on the left side.      Dorsalis pedis pulses are 2+ on the right side and 2+ on the left side.       Posterior tibial pulses are 2+ on the right side and 2+ on the left side.     Heart sounds: No murmur heard.  No gallop.      Comments: 2+ bilateral leg edema. JVD difficult to evaluate due to short neck. Pulmonary:     Effort: Pulmonary effort is normal. No accessory muscle usage.     Breath sounds:  Normal breath sounds.  Abdominal:     General: Bowel sounds are normal.     Palpations: Abdomen is soft.    Laboratory examination:   Recent Labs    11/30/19 1344  NA 145*  K 3.8  CL 104  CO2 24  GLUCOSE 107*  BUN 24  CREATININE 1.02*  CALCIUM 10.2  GFRNONAA 54*  GFRAA 62   CrCl cannot be calculated (Patient's most recent lab result is older than the maximum 21 days allowed.).  CMP Latest Ref Rng & Units 11/30/2019 12/08/2018 08/25/2018  Glucose 65 - 99 mg/dL 107(H) 113(H) 104(H)  BUN 8 - 27 mg/dL '24 19 12  ' Creatinine 0.57 - 1.00 mg/dL 1.02(H) 1.01(H) 0.78  Sodium 134 - 144 mmol/L 145(H) 142 140  Potassium 3.5 - 5.2 mmol/L 3.8 3.7 3.4(L)  Chloride 96 - 106 mmol/L 104 103 105  CO2 20 - 29 mmol/L '24 22 26  ' Calcium 8.7 - 10.3 mg/dL 10.2 9.7 9.4  Total Protein 6.5 - 8.1 g/dL - - -  Total Bilirubin 0.3 - 1.2 mg/dL - - -  Alkaline Phos 38 - 126 U/L - - -  AST 15 - 41 U/L - - -  ALT 14 - 54 U/L - - -   CBC Latest Ref Rng & Units 02/18/2016 12/17/2014 12/28/2013  WBC 4.0 - 10.5 K/uL 5.9 5.7 5.4  Hemoglobin 12.0 - 15.0 g/dL 13.8 14.0 13.5  Hematocrit 36 - 46 % 42.5 41.6 41.2  Platelets 150 - 400 K/uL 168 185 169   Lipid Panel  No results found for: CHOL, TRIG, HDL, CHOLHDL, VLDL, LDLCALC, LDLDIRECT HEMOGLOBIN A1C No results found for: HGBA1C, MPG TSH Recent Labs    11/30/19 1345  TSH 4.720*   BNP (last 3 results) Recent Labs    11/30/19 1342  BNP 177.0*    Care Everywhere Result Report  03/21/2019: BUN 22 creatinine 0.97, EGFR 57 mL, sodium 144, potassium 3.9. Total cholesterol 185, triglycerides 126, HDL 87, LDL 77.  Medications and allergies   Allergies  Allergen Reactions  . Ibuprofen Swelling    Patient reports swelling of legs   . Adhesive [Tape] Other (See Comments)    UNSPECIFIED   . Bactrim [Sulfamethoxazole-Trimethoprim] Other (See Comments)    UNSPECIFIED   .  Dairy Aid [Lactase] Other (See Comments)    Ice cream, sour cream  . Sulfa Antibiotics  Other (See Comments)    UNSPECIFIED   . Meloxicam Nausea Only     Current Outpatient Medications  Medication Instructions  . albuterol (PROAIR HFA) 108 (90 Base) MCG/ACT inhaler USE 2 INHALATIONS EVERY 6 HOURS AS NEEDED FOR WHEEZING OR SHORTNESS OF BREATH  . albuterol (PROVENTIL) (2.5 MG/3ML) 0.083% nebulizer solution Take 3 mLs (2.5 mg dose) by nebulization every 6 (six) hours as needed for Wheezing.  . Calcium Citrate-Vitamin D (CITRACAL + D PO) 2 tablets, Oral, Daily  . clobetasol cream (TEMOVATE) 0.86 % 1 application, Topical, Daily PRN  . dextromethorphan-guaiFENesin (MUCINEX DM) 30-600 MG per 12 hr tablet 1 tablet, Oral, Every 12 hours  . diclofenac sodium (VOLTAREN) 1 % GEL as needed.  . diltiazem (CARDIZEM CD) 240 mg, Oral, Daily  . fluticasone (FLOVENT HFA) 110 MCG/ACT inhaler USE 2 INHALATIONS DAILY  . furosemide (LASIX) 60 mg, Oral, Daily, Can take additional dose if weigh increase >2 lbs in 2 days  . glucosamine-chondroitin 500-400 MG tablet 1 tablet, Oral, 4 times daily, Patient taking 4 times daily.  . hydrALAZINE (APRESOLINE) 50 mg, Oral, 3 times daily  . levothyroxine (SYNTHROID) 88 mcg, Oral, Daily  . losartan (COZAAR) 100 mg, Oral, Daily  . Multiple Vitamins-Minerals (MULTIVITAMIN WITH MINERALS) tablet 1 tablet, Oral, Daily  . naproxen sodium (ALEVE) 220 mg, Oral, Daily PRN  . NON FORMULARY Mega b stress once a day  . Polyethyl Glycol-Propyl Glycol 0.4-0.3 % SOLN 1 drop as needed.  . potassium chloride SA (KLOR-CON) 20 MEQ tablet 20 mEq, Oral, Daily  . sertraline (ZOLOFT) 100 mg, Oral, 2 times daily   There are no discontinued medications.  Radiology:   No results found.  Cardiac Studies:    Lexiscan Myoview stress test 01/17/2019: Lexiscan stress test was performed. Stress EKG is non-diagnostic, as this is pharmacological stress test. Pharmacological myocardial perfusion imaging is normal. LVEF 69%. Low risk study.   Echocardiogram 12/07/2019:  Left  ventricle cavity is normal in size. Moderate concentric hypertrophy  of the left ventricle. Normal global wall motion. Normal LV systolic  function with EF 57%. Doppler evidence of grade II (pseudonormal)  diastolic dysfunction, elevated LAP.  Left atrial cavity is mildly dilated.  Mild (Grade I) mitral regurgitation.  Mild tricuspid regurgitation. RA-RV gradient 20 mmHg.  No significant change compared to previous study on 12/26/2018.     EKG    10/17/2019: Marked sinus bradycardia at rate of 54 bpm with first-degree AV block, normal axis, no evidence of ischemia.  Abnormal EKG. COMPARED TO EKG 11/24/2018: Sinus rhythm/sinus arrhythmia at 64 bpm.  Assessment     ICD-10-CM   1. Chronic diastolic heart failure (HCC)  I50.32   2. Essential hypertension  I10   3. Shortness of breath  R06.02      Recommendations:   DIANE HANEL  is a  79 y.o.  female  with hypothyroidism, hypertension, depression/anxiety, OSA on CPAP, history of left breast cancer, hypocalcemia, CKD stage 3, presents here for follow-up of hypertension and dyspnea and chronic diastolic heart failure.    She being treated for chronic diastolic heart failure.  Since being on Bystolic, she has done well with improvement in blood pressure and also dyspnea.   She is apparently doing well, dyspnea is improved, she is now off of nocturnal oxygen.  Over the July 4 weekend she did eat unhealthy food but is aware that  she should be careful.  Leg edema is slightly worse today but has been again watching her weights on a regular basis.  No changes in the medications were done today, no obvious decompensated heart failure, I will see her back in 6 months for follow-up.   Adrian Prows, MD, [facility] 01/10/2020, 10:38 AM Office: [phone]

## 2020-01-14 DIAGNOSIS — M810 Age-related osteoporosis without current pathological fracture: Secondary | ICD-10-CM | POA: Diagnosis not present

## 2020-02-02 DIAGNOSIS — I5033 Acute on chronic diastolic (congestive) heart failure: Secondary | ICD-10-CM | POA: Diagnosis not present

## 2020-02-18 DIAGNOSIS — M25561 Pain in right knee: Secondary | ICD-10-CM | POA: Diagnosis not present

## 2020-02-20 DIAGNOSIS — R140 Abdominal distension (gaseous): Secondary | ICD-10-CM | POA: Diagnosis not present

## 2020-02-20 DIAGNOSIS — G4733 Obstructive sleep apnea (adult) (pediatric): Secondary | ICD-10-CM | POA: Diagnosis not present

## 2020-02-25 DIAGNOSIS — Z961 Presence of intraocular lens: Secondary | ICD-10-CM | POA: Diagnosis not present

## 2020-02-25 DIAGNOSIS — H524 Presbyopia: Secondary | ICD-10-CM | POA: Diagnosis not present

## 2020-02-25 DIAGNOSIS — D3132 Benign neoplasm of left choroid: Secondary | ICD-10-CM | POA: Diagnosis not present

## 2020-02-25 DIAGNOSIS — H52203 Unspecified astigmatism, bilateral: Secondary | ICD-10-CM | POA: Diagnosis not present

## 2020-02-26 ENCOUNTER — Ambulatory Visit (INDEPENDENT_AMBULATORY_CARE_PROVIDER_SITE_OTHER): Payer: Medicare HMO | Admitting: Pulmonary Disease

## 2020-02-26 ENCOUNTER — Other Ambulatory Visit: Payer: Self-pay

## 2020-02-26 ENCOUNTER — Encounter: Payer: Self-pay | Admitting: Pulmonary Disease

## 2020-02-26 VITALS — BP 122/70 | HR 69 | Temp 98.3°F | Ht 61.5 in | Wt 259.8 lb

## 2020-02-26 DIAGNOSIS — E669 Obesity, unspecified: Secondary | ICD-10-CM | POA: Diagnosis not present

## 2020-02-26 DIAGNOSIS — G4733 Obstructive sleep apnea (adult) (pediatric): Secondary | ICD-10-CM | POA: Diagnosis not present

## 2020-02-26 DIAGNOSIS — J4530 Mild persistent asthma, uncomplicated: Secondary | ICD-10-CM | POA: Diagnosis not present

## 2020-02-26 DIAGNOSIS — G4730 Sleep apnea, unspecified: Secondary | ICD-10-CM

## 2020-02-26 DIAGNOSIS — Z9989 Dependence on other enabling machines and devices: Secondary | ICD-10-CM | POA: Diagnosis not present

## 2020-02-26 NOTE — Progress Notes (Signed)
Pulmonary, Critical Care, and Sleep Medicine  Chief Complaint  Patient presents with   Follow-up    using CPap every night     Constitutional:  BP 122/70 (BP Location: Right Arm)    Pulse 69    Temp 98.3 F (36.8 C) (Oral)    Ht 5' 1.5" (1.562 m)    Wt 259 lb 12.8 oz (117.8 kg)    SpO2 95%    BMI 48.29 kg/m   Past Medical History:  Anemia, Anxiety, Breast cancer 1988, Collagenous colitis, L1 compression fracture, HTN, Hypothyroidism, OA, Osteopenia, Diastolic CHF, CKD 3a  Past Surgical History:  Her  has a past surgical history that includes Modified radical mastectomy w/ axillary lymph node dissection; Breast reduction surgery; Toe Surgery; Septoplasty; Cesarean section; Tonsillectomy and adenoidectomy; right foot surgery; Reconstruction breast w/ latissimus dorsi flap; Colonoscopy w/ biopsies and polypectomy; Cataract extraction w/ intraocular lens  implant, bilateral; Kyphoplasty (N/A, 02/18/2016); Cyst excision (N/A, 08/30/2018); and Reduction mammaplasty (Right).  Brief Summary:   [name] is a 79 y.o. female with obstructive sleep apnea and asthma.    Subjective:  She was seen by Dr. Shearon Stalls few months ago.  Was found to be in heart failure.  Increased diuretic and started on oxygen.  This coincided with when she was started on bystolic.  This has since been d/ced.  Weight is down, breathing better, and no longer requiring supplemental oxygen.  Not having cough, wheeze, sputum.  Uses CPAP nightly.  Her mask wears out after few weeks.  She needs extra masks, but was told by her DME that she couldn't get more masks unless she was due for a new shipment.  Physical Exam:   Appearance - well kempt   ENMT - no sinus tenderness, no oral exudate, no LAN, Mallampati 3 airway, no stridor  Respiratory - equal breath sounds bilaterally, no wheezing or rales  CV - s1s2 regular rate and rhythm, no murmurs  Ext - no clubbing, no edema  Skin - no rashes  Psych -  normal mood and affect  Pulmonary Tests:   Spirometry 06/06/12>>FEV1 1.48 (69%), FVC 1.95 (70%), FEV1% 76  PFT 07/27/12 >> FEV1 1.71 (88%), FEV1% 77, FEF 25-75% 1.44 (64%), TLC 3.58 (77%), DLCO 72%, no BD.  Spirometry 10/24/17 >> FEV1 1.15 (63%), FEV1% 83  FeNO 10/24/17 >> 19  Sleep Tests:   PSG 12/01/06>>AHI 10, SpO2 low 80%, PLMI 0   CPAP titration 02/16/07>>CPAP 11 cm H2O  CPAP 01/25/20 to 02/23/20 >> used on 30 of 30 nights with average 10 hrs 56 min.  Average AHI 1.6 with CPAP 11 cm H2O.  Cardiac Tests:   Echo 12/07/19 >> EF 57%, grade 2 DD, mild MR  Social History:  She  reports that she has never smoked. She has never used smokeless tobacco. She reports current alcohol use of about 1.0 standard drink of alcohol per week. She reports that she does not use drugs.  Family History:  Her family history includes Brain cancer in her maternal aunt; Breast cancer in her maternal aunt and another family member; Hypertension in her brother, father, and mother; Kidney cancer in her paternal uncle and another family member; Lung cancer in her mother; Ovarian cancer in her sister and another family member; Prostate cancer in her paternal uncle.     Assessment/Plan:   Obstructive sleep apnea. - she is compliant with CPAP and reports benefit - she uses [employer] for her DME - continue CPAP 11 cm H2O -  will try to arrange for extra masks, but explained this might not be covered by insurance  Mild, persistent asthma. - continue flovent and prn albuterol  Obesity. - she is aware of how her weight can impact her health, particularly with regard to sleep apnea  Chronic diastolic CHF. - followed by Dr. Einar Gip   Time Spent Involved in Patient Care on Day of Examination:  24 minutes  Follow up:   Patient Instructions  Will try to have [facility] arrange for extra CPAP masks  Follow up in 1 year    Medication List:   Allergies as of 02/26/2020      Reactions   Nebivolol Hcl Other (See  Comments)   Ibuprofen Swelling   Patient reports swelling of legs    Adhesive [tape] Other (See Comments)   UNSPECIFIED    Bactrim [sulfamethoxazole-trimethoprim] Other (See Comments)   UNSPECIFIED   Dairy Aid [lactase] Other (See Comments)   Ice cream, sour cream   Sulfa Antibiotics Other (See Comments)   UNSPECIFIED   Meloxicam Nausea Only      Medication List       Accurate as of February 26, 2020 12:28 PM. If you have any questions, ask your nurse or doctor.        albuterol (2.5 MG/3ML) 0.083% nebulizer solution Commonly known as: PROVENTIL Take 3 mLs (2.5 mg dose) by nebulization every 6 (six) hours as needed for Wheezing.   albuterol 108 (90 Base) MCG/ACT inhaler Commonly known as: ProAir HFA USE 2 INHALATIONS EVERY 6 HOURS AS NEEDED FOR WHEEZING OR SHORTNESS OF BREATH   CITRACAL + D PO Take 2 tablets by mouth daily.   clobetasol cream 0.05 % Commonly known as: TEMOVATE Apply 1 application topically daily as needed (irritation).   dextromethorphan-guaiFENesin 30-600 MG 12hr tablet Commonly known as: MUCINEX DM Take 1 tablet by mouth every 12 (twelve) hours.   diltiazem 240 MG 24 hr capsule Commonly known as: CARDIZEM CD Take 240 mg by mouth daily.   Flovent HFA 110 MCG/ACT inhaler Generic drug: fluticasone USE 2 INHALATIONS DAILY What changed: additional instructions   furosemide 40 MG tablet Commonly known as: LASIX Take 60 mg by mouth daily. Can take additional dose if weigh increase >2 lbs in 2 days   glucosamine-chondroitin 500-400 MG tablet Take 1 tablet by mouth in the morning, at noon, in the evening, and at bedtime. Patient taking 4 times daily.   hydrALAZINE 50 MG tablet Commonly known as: APRESOLINE Take 1 tablet (50 mg total) by mouth 3 (three) times daily.   levothyroxine 88 MCG tablet Commonly known as: SYNTHROID Take 88 mcg by mouth daily.   losartan 100 MG tablet Commonly known as: COZAAR Take 100 mg by mouth daily.    multivitamin with minerals tablet Take 1 tablet by mouth daily.   naproxen sodium 220 MG tablet Commonly known as: ALEVE Take 220 mg by mouth daily as needed.   NON FORMULARY Mega b stress once a day   Polyethyl Glycol-Propyl Glycol 0.4-0.3 % Soln 1 drop as needed.   potassium chloride SA 20 MEQ tablet Commonly known as: KLOR-CON Take 20 mEq by mouth daily.   sertraline 100 MG tablet Commonly known as: ZOLOFT Take 100 mg by mouth 2 (two) times daily.   Voltaren 1 % Gel Generic drug: diclofenac sodium as needed.       Signature:  Chesley Mires, MD [place] Pager - [phone] 02/26/2020, 12:28 PM

## 2020-02-26 NOTE — Patient Instructions (Signed)
Will try to have [facility] arrange for extra CPAP masks  Follow up in 1 year

## 2020-02-29 DIAGNOSIS — M25561 Pain in right knee: Secondary | ICD-10-CM | POA: Diagnosis not present

## 2020-02-29 DIAGNOSIS — M1711 Unilateral primary osteoarthritis, right knee: Secondary | ICD-10-CM | POA: Diagnosis not present

## 2020-03-04 DIAGNOSIS — I5033 Acute on chronic diastolic (congestive) heart failure: Secondary | ICD-10-CM | POA: Diagnosis not present

## 2020-03-11 ENCOUNTER — Other Ambulatory Visit: Payer: Self-pay | Admitting: Cardiology

## 2020-03-11 DIAGNOSIS — I10 Essential (primary) hypertension: Secondary | ICD-10-CM

## 2020-03-13 DIAGNOSIS — C50012 Malignant neoplasm of nipple and areola, left female breast: Secondary | ICD-10-CM | POA: Diagnosis not present

## 2020-03-13 DIAGNOSIS — R69 Illness, unspecified: Secondary | ICD-10-CM | POA: Diagnosis not present

## 2020-03-13 DIAGNOSIS — E039 Hypothyroidism, unspecified: Secondary | ICD-10-CM | POA: Diagnosis not present

## 2020-03-13 DIAGNOSIS — F3342 Major depressive disorder, recurrent, in full remission: Secondary | ICD-10-CM | POA: Diagnosis not present

## 2020-03-13 DIAGNOSIS — I10 Essential (primary) hypertension: Secondary | ICD-10-CM | POA: Diagnosis not present

## 2020-03-13 DIAGNOSIS — M8589 Other specified disorders of bone density and structure, multiple sites: Secondary | ICD-10-CM | POA: Diagnosis not present

## 2020-03-13 DIAGNOSIS — Z0000 Encounter for general adult medical examination without abnormal findings: Secondary | ICD-10-CM | POA: Diagnosis not present

## 2020-03-13 DIAGNOSIS — Z6841 Body Mass Index (BMI) 40.0 and over, adult: Secondary | ICD-10-CM | POA: Diagnosis not present

## 2020-03-17 ENCOUNTER — Other Ambulatory Visit: Payer: Self-pay | Admitting: Pulmonary Disease

## 2020-03-17 DIAGNOSIS — J4530 Mild persistent asthma, uncomplicated: Secondary | ICD-10-CM

## 2020-03-17 DIAGNOSIS — I10 Essential (primary) hypertension: Secondary | ICD-10-CM | POA: Diagnosis not present

## 2020-03-17 DIAGNOSIS — E039 Hypothyroidism, unspecified: Secondary | ICD-10-CM | POA: Diagnosis not present

## 2020-03-24 DIAGNOSIS — R69 Illness, unspecified: Secondary | ICD-10-CM | POA: Diagnosis not present

## 2020-03-31 DIAGNOSIS — Z713 Dietary counseling and surveillance: Secondary | ICD-10-CM | POA: Diagnosis not present

## 2020-03-31 DIAGNOSIS — Z6841 Body Mass Index (BMI) 40.0 and over, adult: Secondary | ICD-10-CM | POA: Diagnosis not present

## 2020-03-31 DIAGNOSIS — I10 Essential (primary) hypertension: Secondary | ICD-10-CM | POA: Diagnosis not present

## 2020-03-31 DIAGNOSIS — R69 Illness, unspecified: Secondary | ICD-10-CM | POA: Diagnosis not present

## 2020-03-31 DIAGNOSIS — Z13228 Encounter for screening for other metabolic disorders: Secondary | ICD-10-CM | POA: Diagnosis not present

## 2020-03-31 DIAGNOSIS — E039 Hypothyroidism, unspecified: Secondary | ICD-10-CM | POA: Diagnosis not present

## 2020-03-31 DIAGNOSIS — G4730 Sleep apnea, unspecified: Secondary | ICD-10-CM | POA: Diagnosis not present

## 2020-03-31 DIAGNOSIS — M8589 Other specified disorders of bone density and structure, multiple sites: Secondary | ICD-10-CM | POA: Diagnosis not present

## 2020-03-31 DIAGNOSIS — J45909 Unspecified asthma, uncomplicated: Secondary | ICD-10-CM | POA: Diagnosis not present

## 2020-03-31 DIAGNOSIS — H8110 Benign paroxysmal vertigo, unspecified ear: Secondary | ICD-10-CM | POA: Diagnosis not present

## 2020-04-01 DIAGNOSIS — Z6841 Body Mass Index (BMI) 40.0 and over, adult: Secondary | ICD-10-CM | POA: Diagnosis not present

## 2020-04-01 DIAGNOSIS — Z13228 Encounter for screening for other metabolic disorders: Secondary | ICD-10-CM | POA: Diagnosis not present

## 2020-04-01 DIAGNOSIS — Z833 Family history of diabetes mellitus: Secondary | ICD-10-CM | POA: Diagnosis not present

## 2020-04-03 DIAGNOSIS — I5033 Acute on chronic diastolic (congestive) heart failure: Secondary | ICD-10-CM | POA: Diagnosis not present

## 2020-04-04 DIAGNOSIS — R69 Illness, unspecified: Secondary | ICD-10-CM | POA: Diagnosis not present

## 2020-04-09 DIAGNOSIS — M25511 Pain in right shoulder: Secondary | ICD-10-CM | POA: Diagnosis not present

## 2020-04-10 ENCOUNTER — Other Ambulatory Visit: Payer: Self-pay | Admitting: Orthopaedic Surgery

## 2020-04-10 DIAGNOSIS — M25511 Pain in right shoulder: Secondary | ICD-10-CM

## 2020-04-14 DIAGNOSIS — M1711 Unilateral primary osteoarthritis, right knee: Secondary | ICD-10-CM | POA: Diagnosis not present

## 2020-04-23 DIAGNOSIS — M1711 Unilateral primary osteoarthritis, right knee: Secondary | ICD-10-CM | POA: Diagnosis not present

## 2020-04-30 DIAGNOSIS — M1711 Unilateral primary osteoarthritis, right knee: Secondary | ICD-10-CM | POA: Diagnosis not present

## 2020-05-03 ENCOUNTER — Other Ambulatory Visit: Payer: Medicare HMO

## 2020-05-04 DIAGNOSIS — I5033 Acute on chronic diastolic (congestive) heart failure: Secondary | ICD-10-CM | POA: Diagnosis not present

## 2020-05-06 ENCOUNTER — Telehealth: Payer: Self-pay | Admitting: Pulmonary Disease

## 2020-05-06 MED ORDER — FLOVENT HFA 110 MCG/ACT IN AERO: INHALATION_SPRAY | RESPIRATORY_TRACT | 3 refills | Status: DC

## 2020-05-06 NOTE — Telephone Encounter (Signed)
Pt requesting refill on Flovent.  This has been sent to preferred pharmacy.  Nothing further needed at this time- will close encounter.

## 2020-05-12 DIAGNOSIS — Z6841 Body Mass Index (BMI) 40.0 and over, adult: Secondary | ICD-10-CM | POA: Diagnosis not present

## 2020-05-12 DIAGNOSIS — I10 Essential (primary) hypertension: Secondary | ICD-10-CM | POA: Diagnosis not present

## 2020-05-12 DIAGNOSIS — Z713 Dietary counseling and surveillance: Secondary | ICD-10-CM | POA: Diagnosis not present

## 2020-05-20 ENCOUNTER — Ambulatory Visit
Admission: RE | Admit: 2020-05-20 | Discharge: 2020-05-20 | Disposition: A | Discharge status: Home / Self Care | Payer: Medicare HMO | Source: Ambulatory Visit | Attending: Orthopaedic Surgery | Admitting: Orthopaedic Surgery

## 2020-05-20 DIAGNOSIS — M7551 Bursitis of right shoulder: Secondary | ICD-10-CM | POA: Diagnosis not present

## 2020-05-20 DIAGNOSIS — M75121 Complete rotator cuff tear or rupture of right shoulder, not specified as traumatic: Secondary | ICD-10-CM | POA: Diagnosis not present

## 2020-05-20 DIAGNOSIS — S42291A Other displaced fracture of upper end of right humerus, initial encounter for closed fracture: Secondary | ICD-10-CM | POA: Diagnosis not present

## 2020-05-20 DIAGNOSIS — M6258 Muscle wasting and atrophy, not elsewhere classified, other site: Secondary | ICD-10-CM | POA: Diagnosis not present

## 2020-05-20 DIAGNOSIS — M25511 Pain in right shoulder: Secondary | ICD-10-CM

## 2020-05-20 MED ORDER — GADOBENATE DIMEGLUMINE 529 MG/ML IV SOLN
20.0000 mL | Freq: Once | INTRAVENOUS | Status: AC | PRN
  Administered 2020-05-20: 20 mL via INTRAVENOUS

## 2020-05-21 DIAGNOSIS — Z713 Dietary counseling and surveillance: Secondary | ICD-10-CM | POA: Diagnosis not present

## 2020-05-21 DIAGNOSIS — G4730 Sleep apnea, unspecified: Secondary | ICD-10-CM | POA: Diagnosis not present

## 2020-05-21 DIAGNOSIS — Z6841 Body Mass Index (BMI) 40.0 and over, adult: Secondary | ICD-10-CM | POA: Diagnosis not present

## 2020-05-22 DIAGNOSIS — G4733 Obstructive sleep apnea (adult) (pediatric): Secondary | ICD-10-CM | POA: Diagnosis not present

## 2020-05-26 DIAGNOSIS — M1711 Unilateral primary osteoarthritis, right knee: Secondary | ICD-10-CM | POA: Diagnosis not present

## 2020-05-26 DIAGNOSIS — M1712 Unilateral primary osteoarthritis, left knee: Secondary | ICD-10-CM | POA: Diagnosis not present

## 2020-05-26 DIAGNOSIS — M25511 Pain in right shoulder: Secondary | ICD-10-CM | POA: Diagnosis not present

## 2020-06-03 DIAGNOSIS — I5033 Acute on chronic diastolic (congestive) heart failure: Secondary | ICD-10-CM | POA: Diagnosis not present

## 2020-06-06 DIAGNOSIS — R0781 Pleurodynia: Secondary | ICD-10-CM | POA: Diagnosis not present

## 2020-06-16 DIAGNOSIS — H01001 Unspecified blepharitis right upper eyelid: Secondary | ICD-10-CM | POA: Diagnosis not present

## 2020-06-16 DIAGNOSIS — Z961 Presence of intraocular lens: Secondary | ICD-10-CM | POA: Diagnosis not present

## 2020-06-16 DIAGNOSIS — H01004 Unspecified blepharitis left upper eyelid: Secondary | ICD-10-CM | POA: Diagnosis not present

## 2020-06-18 DIAGNOSIS — Z6841 Body Mass Index (BMI) 40.0 and over, adult: Secondary | ICD-10-CM | POA: Diagnosis not present

## 2020-06-18 DIAGNOSIS — Z713 Dietary counseling and surveillance: Secondary | ICD-10-CM | POA: Diagnosis not present

## 2020-06-18 DIAGNOSIS — E039 Hypothyroidism, unspecified: Secondary | ICD-10-CM | POA: Diagnosis not present

## 2020-07-04 DIAGNOSIS — I5033 Acute on chronic diastolic (congestive) heart failure: Secondary | ICD-10-CM | POA: Diagnosis not present

## 2020-07-05 DIAGNOSIS — J189 Pneumonia, unspecified organism: Secondary | ICD-10-CM

## 2020-07-05 HISTORY — DX: Pneumonia, unspecified organism: J18.9

## 2020-07-14 ENCOUNTER — Ambulatory Visit: Payer: Medicare HMO | Admitting: Cardiology

## 2020-07-14 ENCOUNTER — Other Ambulatory Visit: Payer: Self-pay

## 2020-07-14 ENCOUNTER — Encounter: Payer: Self-pay | Admitting: Cardiology

## 2020-07-14 VITALS — BP 140/69 | HR 71 | Resp 16 | Ht 61.5 in | Wt 258.4 lb

## 2020-07-14 DIAGNOSIS — I10 Essential (primary) hypertension: Secondary | ICD-10-CM

## 2020-07-14 DIAGNOSIS — I5032 Chronic diastolic (congestive) heart failure: Secondary | ICD-10-CM

## 2020-07-14 DIAGNOSIS — Z6841 Body Mass Index (BMI) 40.0 and over, adult: Secondary | ICD-10-CM

## 2020-07-14 NOTE — Progress Notes (Signed)
Primary Physician/Referring:  [name]  Patient ID: [name], female    DOB: 1941/01/03, 80 y.o.   MRN: [id number]  Chief Complaint  Patient presents with  . Congestive Heart Failure  . Chronic diastolic heart failure   . Follow-up    6 month   HPI:    [name]  is a 80 y.o. Caucasian female  with hypothyroidism, hypertension, depression/anxiety, OSA on CPAP, history of left breast cancer, hypocalcemia, CKD stage 3. She did not tolerate Bisoprolol,  labetalol due to worsening asthma and Aldactone caused worsening renal function.   She is on 60 mg of lasix daily.  No specific complaints, dyspnea has remained stable, very mild leg edema especially towards the end of the day.  Arthritis continues to bother her.  No significant change in her weight.  Past Medical History:  Diagnosis Date  . Anemia    PMH  . Anxiety   . Asthma   . Breast cancer ([place]) 1988   left breast cancer  . Collagenous colitis   . Compression fracture    lumbar 1  . Hypertension   . Hypothyroidism   . Osteoarthritis   . Osteopenia   . PONV (postoperative nausea and vomiting)   . Sleep apnea    wears CPAP nightly   Past Surgical History:  Procedure Laterality Date  . BREAST REDUCTION SURGERY     Right  . CATARACT EXTRACTION W/ INTRAOCULAR LENS  IMPLANT, BILATERAL    . CESAREAN SECTION    . COLONOSCOPY W/ BIOPSIES AND POLYPECTOMY    . CYST EXCISION N/A 08/30/2018   Procedure: excision of posterior shoulder / back sebaceous cyst;  Surgeon: Wallace Going, DO;  Location: [facility];  Service: Plastics;  Laterality: N/A;  . KYPHOPLASTY N/A 02/18/2016   Procedure: LUMBAR 1 KYPHOPLASTY;  Surgeon: Phylliss Bob, MD;  Location: [facility];  Service: Orthopedics;  Laterality: N/A;  LUMBAR 1 KYPHOPLASTY  . MODIFIED RADICAL MASTECTOMY W/ AXILLARY LYMPH NODE DISSECTION     Left  . RECONSTRUCTION BREAST W/ LATISSIMUS DORSI FLAP     left  . REDUCTION MAMMAPLASTY Right    . right foot surgery     corrected hammer toe, straightened big toe  . SEPTOPLASTY    . TOE SURGERY    . TONSILLECTOMY AND ADENOIDECTOMY     Family History  Problem Relation Age of Onset  . Lung cancer Mother   . Hypertension Mother   . Ovarian cancer Sister   . Breast cancer Maternal Aunt   . Brain cancer Maternal Aunt   . Breast cancer Other        cousin  . Ovarian cancer Other        cousin  . Kidney cancer Other        cousin  . Kidney cancer Paternal Uncle   . Prostate cancer Paternal Uncle   . Hypertension Father   . Hypertension Brother     Social History   Tobacco Use  . Smoking status: Never Smoker  . Smokeless tobacco: Never Used  Substance Use Topics  . Alcohol use: Yes    Alcohol/week: 1.0 standard drink    Types: 1 Glasses of wine per week    Comment: every night   Marital Status: Married  ROS  Review of Systems  Cardiovascular: Positive for dyspnea on exertion (improved) and leg swelling (essentially resolved). Negative for chest pain.  Respiratory: Negative for wheezing.   Gastrointestinal: Negative for  melena.   Objective  Blood pressure 140/69, pulse 71, resp. rate 16, height 5' 1.5" (1.562 m), weight 258 lb 6.4 oz (117.2 kg), SpO2 92 %.  Vitals with BMI 07/14/2020 02/26/2020 01/10/2020  Height 5' 1.5" 5' 1.5" '5\' 2"'   Weight 258 lbs 6 oz 259 lbs 13 oz 260 lbs 6 oz  BMI 48.04 16.6 06.30  Systolic 160 109 323  Diastolic 69 70 55  Pulse 71 69 71     Physical Exam Constitutional:      General: She is not in acute distress.    Appearance: She is well-developed. She is obese.  Cardiovascular:     Rate and Rhythm: Normal rate and regular rhythm.     Pulses: Normal pulses and intact distal pulses.          Carotid pulses are 2+ on the right side and 2+ on the left side.      Dorsalis pedis pulses are 2+ on the right side and 2+ on the left side.       Posterior tibial pulses are 2+ on the right side and 2+ on the left side.     Heart sounds: No  murmur heard. No gallop.      Comments: 1-2+ bilateral below-knee pitting edema present.  Mostly 2+ towards the ankle.  No tenderness. No JVD. Pulmonary:     Effort: Pulmonary effort is normal. No accessory muscle usage.     Breath sounds: Normal breath sounds.  Abdominal:     General: Bowel sounds are normal.     Palpations: Abdomen is soft.    Laboratory examination:   Recent Labs    11/30/19 1344  NA 145*  K 3.8  CL 104  CO2 24  GLUCOSE 107*  BUN 24  CREATININE 1.02*  CALCIUM 10.2  GFRNONAA 54*  GFRAA 62   CrCl cannot be calculated (Patient's most recent lab result is older than the maximum 21 days allowed.).  CMP Latest Ref Rng & Units 11/30/2019 12/08/2018 08/25/2018  Glucose 65 - 99 mg/dL 107(H) 113(H) 104(H)  BUN 8 - 27 mg/dL '24 19 12  ' Creatinine 0.57 - 1.00 mg/dL 1.02(H) 1.01(H) 0.78  Sodium 134 - 144 mmol/L 145(H) 142 140  Potassium 3.5 - 5.2 mmol/L 3.8 3.7 3.4(L)  Chloride 96 - 106 mmol/L 104 103 105  CO2 20 - 29 mmol/L '24 22 26  ' Calcium 8.7 - 10.3 mg/dL 10.2 9.7 9.4  Total Protein 6.5 - 8.1 g/dL - - -  Total Bilirubin 0.3 - 1.2 mg/dL - - -  Alkaline Phos 38 - 126 U/L - - -  AST 15 - 41 U/L - - -  ALT 14 - 54 U/L - - -   CBC Latest Ref Rng & Units 02/18/2016 12/17/2014 12/28/2013  WBC 4.0 - 10.5 K/uL 5.9 5.7 5.4  Hemoglobin 12.0 - 15.0 g/dL 13.8 14.0 13.5  Hematocrit 36.0 - 46.0 % 42.5 41.6 41.2  Platelets 150 - 400 K/uL 168 185 169   Lipid Panel  No results found for: CHOL, TRIG, HDL, CHOLHDL, VLDL, LDLCALC, LDLDIRECT HEMOGLOBIN A1C No results found for: HGBA1C, MPG TSH Recent Labs    11/30/19 1345  TSH 4.720*   BNP (last 3 results) Recent Labs    11/30/19 1342  BNP 177.0*    External labs:   Labs 04/01/2020:  A1c 5.1%.  TSH normal.  Hb 12.6/HCT 38.4, mild macrocytosis present, platelets 172.  Serum glucose 94 mg, BUN 17, creatinine 0.86, EGFR 66 mL, sodium  142, potassium 3.6, CMP otherwise normal.  Total cholesterol 217, triglycerides  68, HDL 118, LDL 87. 03/21/2019: BUN 22 creatinine 0.97, EGFR 57 mL, sodium 144, potassium 3.9. Total cholesterol 185, triglycerides 126, HDL 87, LDL 77.  Medications and allergies   Allergies  Allergen Reactions  . Nebivolol Hcl Other (See Comments)  . Ibuprofen Swelling    Patient reports swelling of legs   . Adhesive [Tape] Other (See Comments)    UNSPECIFIED   . Bactrim [Sulfamethoxazole-Trimethoprim] Other (See Comments)    UNSPECIFIED   . Dairy Aid [Lactase] Other (See Comments)    Ice cream, sour cream  . Sulfa Antibiotics Other (See Comments)    UNSPECIFIED   . Meloxicam Nausea Only    Current Outpatient Medications on File Prior to Visit  Medication Sig Dispense Refill  . albuterol (PROAIR HFA) 108 (90 Base) MCG/ACT inhaler USE 2 INHALATIONS EVERY 6 HOURS AS NEEDED FOR WHEEZING OR SHORTNESS OF BREATH 51 g 3  . albuterol (PROVENTIL) (2.5 MG/3ML) 0.083% nebulizer solution Take 3 mLs (2.5 mg dose) by nebulization every 6 (six) hours as needed for Wheezing. 360 mL 0  . Calcium Citrate-Vitamin D (CITRACAL + D PO) Take 2 tablets by mouth daily.    . clobetasol cream (TEMOVATE) 9.44 % Apply 1 application topically daily as needed (irritation).    Marland Kitchen dextromethorphan-guaiFENesin (MUCINEX DM) 30-600 MG per 12 hr tablet Take 1 tablet by mouth every 12 (twelve) hours.    . diclofenac sodium (VOLTAREN) 1 % GEL as needed.    . diltiazem (CARDIZEM CD) 240 MG 24 hr capsule Take 240 mg by mouth daily.    . fluticasone (FLOVENT HFA) 110 MCG/ACT inhaler USE 2 INHALATIONS BID 36 g 3  . furosemide (LASIX) 40 MG tablet Take 60 mg by mouth daily. Can take additional dose if weigh increase >2 lbs in 2 days    . glucosamine-chondroitin 500-400 MG tablet Take 1 tablet by mouth in the morning, at noon, in the evening, and at bedtime. Patient taking 4 times daily.    . hydrALAZINE (APRESOLINE) 50 MG tablet TAKE 1 TABLET THREE TIMES A DAY 270 tablet 3  . levothyroxine (SYNTHROID, LEVOTHROID) 88 MCG  tablet Take 88 mcg by mouth daily.    Marland Kitchen losartan (COZAAR) 100 MG tablet Take 100 mg by mouth daily.    . Multiple Vitamins-Minerals (MULTIVITAMIN WITH MINERALS) tablet Take 1 tablet by mouth daily.    . naproxen sodium (ALEVE) 220 MG tablet Take 220 mg by mouth daily as needed.    . NON FORMULARY Mega b stress once a day    . Polyethyl Glycol-Propyl Glycol 0.4-0.3 % SOLN 1 drop as needed.    . potassium chloride SA (KLOR-CON) 20 MEQ tablet Take 20 mEq by mouth daily.    . sertraline (ZOLOFT) 100 MG tablet Take 100 mg by mouth 2 (two) times daily.      No current facility-administered medications on file prior to visit.    There are no discontinued medications.  Radiology:   No results found.  Cardiac Studies:    Lexiscan Myoview stress test 01/17/2019: Lexiscan stress test was performed. Stress EKG is non-diagnostic, as this is pharmacological stress test. Pharmacological myocardial perfusion imaging is normal. LVEF 69%. Low risk study.   Echocardiogram 12/07/2019:  Left ventricle cavity is normal in size. Moderate concentric hypertrophy  of the left ventricle. Normal global wall motion. Normal LV systolic  function with EF 57%. Doppler evidence of grade II (pseudonormal)  diastolic  dysfunction, elevated LAP.  Left atrial cavity is mildly dilated.  Mild (Grade I) mitral regurgitation.  Mild tricuspid regurgitation. RA-RV gradient 20 mmHg.  No significant change compared to previous study on 12/26/2018.     EKG      EKG 07/14/2020: Sinus rhythm at the rate of 63 bpm with borderline first-degree AV block, no evidence of ischemia, normal EKG otherwise.    10/17/2019: Marked sinus bradycardia at rate of 54 bpm with first-degree AV block, normal axis, no evidence of ischemia.  Abnormal EKG. COMPARED TO EKG 11/24/2018: Sinus rhythm/sinus arrhythmia at 64 bpm.  Assessment     ICD-10-CM   1. Chronic diastolic heart failure (HCC)  I50.32 EKG 12-Lead  2. Essential hypertension  I10    3. Class 3 severe obesity due to excess calories with serious comorbidity and body mass index (BMI) of 45.0 to 49.9 in adult (HCC)  E66.01    Z68.42     No orders of the defined types were placed in this encounter. There are no discontinued medications.  Orders Placed This Encounter  Procedures  . EKG 12-Lead     Recommendations:   [name]  is a  80 y.o.  female  with hypothyroidism, hypertension, depression/anxiety, OSA on CPAP, history of left breast cancer, hypocalcemia, CKD stage 3, presents here for follow-up of hypertension and dyspnea and chronic diastolic heart failure.    She being treated for chronic diastolic heart failure.  Her weight has remained stable, she is enrolled in RPM and PCM for heart failure management in our clinic.  Blood pressure is well controlled with losartan, hydralazine and furosemide.  Again weight loss was discussed with the patient.  I reviewed her external labs, renal function has improved from stage III chronic kidney disease.  Lipids are normal.  No changes in the medications were done today.  I will see her back in 6 months.   Adrian Prows, MD, [facility] 07/14/2020, 11:42 AM Office: [phone]

## 2020-09-01 ENCOUNTER — Telehealth: Payer: Self-pay | Admitting: Pulmonary Disease

## 2020-09-01 NOTE — Telephone Encounter (Signed)
Spoke with the pt and notified of response per [name]  She verbalized understanding  Nothing further needed

## 2020-09-01 NOTE — Telephone Encounter (Signed)
Spoke with the pt  She states having increased SOB x 2 days- feels SOB with little movement  She sounded SOB when I was speaking with her on the phone, stopping to catch breath at times  She states her sats with exertion have been dropping as low as 83%ra- she has no o2 at home and has never been prescribed this  She denies wheezing, CP, f/c/s, sore throat, HA  She states that her voice has been hoarse today  Still on flovent and has been using her albuterol inhaler and neb She had covid end of Jan 2022 and was not hospitalized  I advised may need to go to ED for eval and she wanted to make sure this is what provider would rec  Please advise, thanks!

## 2020-09-01 NOTE — Telephone Encounter (Signed)
I agree with ED. She needs oxygen and CXR.

## 2020-09-02 ENCOUNTER — Ambulatory Visit: Payer: Medicare HMO | Admitting: Student

## 2020-09-02 ENCOUNTER — Encounter: Payer: Self-pay | Admitting: Student

## 2020-09-02 ENCOUNTER — Ambulatory Visit
Admission: RE | Admit: 2020-09-02 | Discharge: 2020-09-02 | Disposition: A | Discharge status: Home / Self Care | Payer: Medicare HMO | Source: Ambulatory Visit | Attending: Student | Admitting: Student

## 2020-09-02 ENCOUNTER — Other Ambulatory Visit: Payer: Self-pay

## 2020-09-02 VITALS — BP 147/63 | HR 70 | Temp 98.9°F | Ht 61.5 in | Wt 248.0 lb

## 2020-09-02 DIAGNOSIS — R0602 Shortness of breath: Secondary | ICD-10-CM

## 2020-09-02 DIAGNOSIS — I5032 Chronic diastolic (congestive) heart failure: Secondary | ICD-10-CM

## 2020-09-02 DIAGNOSIS — R600 Localized edema: Secondary | ICD-10-CM

## 2020-09-02 DIAGNOSIS — J4531 Mild persistent asthma with (acute) exacerbation: Secondary | ICD-10-CM

## 2020-09-02 MED ORDER — PREDNISONE 20 MG PO TABS: ORAL_TABLET | ORAL | 0 refills | Status: DC

## 2020-09-02 NOTE — Progress Notes (Signed)
Primary Physician/Referring:  [name]  Patient ID: [name], female    DOB: [birth year], 80 y.o.   MRN: [id number]  No chief complaint on file.  HPI:    [name]  is a 80 y.o. Caucasian female  with hypothyroidism, hypertension, depression/anxiety, OSA on CPAP, history of left breast cancer, hypocalcemia, CKD stage 3. She did not tolerate Bisoprolol,  labetalol due to worsening asthma and Aldactone caused worsening renal function.   Patient presents for urgent visit with complaints of shortness of breath acutely worsening over the last 2-3 days.  Patient states she called her pulmonologist who recommended she go to the emergency department for evaluation and treatment, however patient wished to avoid the ED and therefore scheduled an appointment with our office.  Denies chest pain, palpitations, dizziness, syncope, near syncope, leg swelling, orthopnea.  Patient has not used her albuterol inhaler, she does have a significant history of asthma.   Past Medical History:  Diagnosis Date  . Anemia    PMH  . Anxiety   . Asthma   . Breast cancer ([place]) 1988   left breast cancer  . Collagenous colitis   . Compression fracture    lumbar 1  . Hypertension   . Hypothyroidism   . Osteoarthritis   . Osteopenia   . PONV (postoperative nausea and vomiting)   . Sleep apnea    wears CPAP nightly   Past Surgical History:  Procedure Laterality Date  . BREAST REDUCTION SURGERY     Right  . CATARACT EXTRACTION W/ INTRAOCULAR LENS  IMPLANT, BILATERAL    . CESAREAN SECTION    . COLONOSCOPY W/ BIOPSIES AND POLYPECTOMY    . CYST EXCISION N/A 08/30/2018   Procedure: excision of posterior shoulder / back sebaceous cyst;  Surgeon: Wallace Going, DO;  Location: [place];  Service: Plastics;  Laterality: N/A;  . KYPHOPLASTY N/A 02/18/2016   Procedure: LUMBAR 1 KYPHOPLASTY;  Surgeon: Phylliss Bob, MD;  Location: [place];  Service: Orthopedics;  Laterality:  N/A;  LUMBAR 1 KYPHOPLASTY  . MODIFIED RADICAL MASTECTOMY W/ AXILLARY LYMPH NODE DISSECTION     Left  . RECONSTRUCTION BREAST W/ LATISSIMUS DORSI FLAP     left  . REDUCTION MAMMAPLASTY Right   . right foot surgery     corrected hammer toe, straightened big toe  . SEPTOPLASTY    . TOE SURGERY    . TONSILLECTOMY AND ADENOIDECTOMY     Family History  Problem Relation Age of Onset  . Lung cancer Mother   . Hypertension Mother   . Ovarian cancer Sister   . Breast cancer Maternal Aunt   . Brain cancer Maternal Aunt   . Breast cancer Other        cousin  . Ovarian cancer Other        cousin  . Kidney cancer Other        cousin  . Kidney cancer Paternal Uncle   . Prostate cancer Paternal Uncle   . Hypertension Father   . Hypertension Brother     Social History   Tobacco Use  . Smoking status: Never Smoker  . Smokeless tobacco: Never Used  Substance Use Topics  . Alcohol use: Yes    Alcohol/week: 1.0 standard drink    Types: 1 Glasses of wine per week    Comment: every night   Marital Status: Married  ROS  Review of Systems  Constitutional: Positive for malaise/fatigue. Negative for weight  gain.  Cardiovascular: Positive for dyspnea on exertion. Negative for chest pain, claudication, leg swelling, near-syncope, orthopnea, palpitations, paroxysmal nocturnal dyspnea and syncope.  Respiratory: Positive for shortness of breath. Negative for wheezing.   Hematologic/Lymphatic: Does not bruise/bleed easily.  Gastrointestinal: Negative for melena.  Neurological: Negative for dizziness and weakness.   Objective  Blood pressure (!) 147/63, pulse 70, temperature 98.9 F (37.2 C), height 5' 1.5" (1.562 m), weight 248 lb (112.5 kg), SpO2 93 %.  Vitals with BMI 09/02/2020 07/14/2020 02/26/2020  Height 5' 1.5" 5' 1.5" 5' 1.5"  Weight 248 lbs 258 lbs 6 oz 259 lbs 13 oz  BMI 46.11 65.03 54.6  Systolic 568 127 517  Diastolic 63 69 70  Pulse 70 71 69     Physical Exam Vitals reviewed.   Constitutional:      General: She is not in acute distress.    Appearance: She is well-developed. She is obese. She is not ill-appearing.  HENT:     Head: Normocephalic and atraumatic.  Cardiovascular:     Rate and Rhythm: Normal rate and regular rhythm.     Pulses: Normal pulses and intact distal pulses.          Carotid pulses are 2+ on the right side and 2+ on the left side.      Dorsalis pedis pulses are 2+ on the right side and 2+ on the left side.       Posterior tibial pulses are 2+ on the right side and 2+ on the left side.     Heart sounds: S1 normal and S2 normal. No murmur heard. No gallop.      Comments: No JVD. Pulmonary:     Effort: Pulmonary effort is normal. No accessory muscle usage or respiratory distress.     Breath sounds: Wheezing present. No rhonchi or rales.  Musculoskeletal:     Right lower leg: Edema (1+ pitting, stable) present.     Left lower leg: Edema (1+ pitting stable) present.  Skin:    General: Skin is warm and dry.  Neurological:     Mental Status: She is alert.    Laboratory examination:   Recent Labs    11/30/19 1344 09/02/20 1528  NA 145* 145*  K 3.8 3.4*  CL 104 104  CO2 24 22  GLUCOSE 107* 100*  BUN 24 17  CREATININE 1.02* 0.88  CALCIUM 10.2 9.8  GFRNONAA 54*  --   GFRAA 62  --    estimated creatinine clearance is 62.9 mL/min (by C-G formula based on SCr of 0.88 mg/dL).  CMP Latest Ref Rng & Units 09/02/2020 11/30/2019 12/08/2018  Glucose 65 - 99 mg/dL 100(H) 107(H) 113(H)  BUN 8 - 27 mg/dL _0 Creatinine 0.57 - 1.00 mg/dL 0.88 1.02(H) 1.01(H)  Sodium 134 - 144 mmol/L 145(H) 145(H) 142  Potassium 3.5 - 5.2 mmol/L 3.4(L) 3.8 3.7  Chloride 96 - 106 mmol/L 104 104 103  CO2 20 - 29 mmol/L _1 Calcium 8.7 - 10.3 mg/dL 9.8 10.2 9.7  Total Protein 6.5 - 8.1 g/dL - - -  Total Bilirubin 0.3 - 1.2 mg/dL - - -  Alkaline Phos 38 - 126 U/L - - -  AST 15 - 41 U/L - - -  ALT 14 - 54 U/L - - -   CBC Latest Ref Rng & Units  09/02/2020 02/18/2016 12/17/2014  WBC 3.4 - 10.8 x10E3/uL 7.8 5.9 5.7  Hemoglobin 11.1 - 15.9 g/dL 12.2 13.8  14.0  Hematocrit 34.0 - 46.6 % 36.0 42.5 41.6  Platelets 150 - 450 x10E3/uL 185 168 185   Lipid Panel  No results found for: CHOL, TRIG, HDL, CHOLHDL, VLDL, LDLCALC, LDLDIRECT HEMOGLOBIN A1C No results found for: HGBA1C, MPG TSH Recent Labs    11/30/19 1345  TSH 4.720*   BNP (last 3 results) Recent Labs    11/30/19 1342 09/02/20 1528  BNP 177.0* 53.1    External labs: Labs 04/01/2020:  A1c 5.1%.  TSH normal.  Hb 12.6/HCT 38.4, mild macrocytosis present, platelets 172.  Serum glucose 94 mg, BUN 17, creatinine 0.86, EGFR 66 mL, sodium 142, potassium 3.6, CMP otherwise normal.  Total cholesterol 217, triglycerides 68, HDL 118, LDL 87. 03/21/2019: BUN 22 creatinine 0.97, EGFR 57 mL, sodium 144, potassium 3.9. Total cholesterol 185, triglycerides 126, HDL 87, LDL 77.  Medications and allergies   Allergies  Allergen Reactions  . Nebivolol Hcl Other (See Comments)  . Ibuprofen Swelling    Patient reports swelling of legs   . Adhesive [Tape] Other (See Comments)    UNSPECIFIED   . Bactrim [Sulfamethoxazole-Trimethoprim] Other (See Comments)    UNSPECIFIED   . Dairy Aid [Lactase] Other (See Comments)    Ice cream, sour cream  . Lactose Intolerance (Gi) Other (See Comments)  . Microplegia Msa-Msg [Plegisol] Other (See Comments)  . Sulfa Antibiotics Other (See Comments)    UNSPECIFIED   . Meloxicam Nausea Only    Current Outpatient Medications on File Prior to Visit  Medication Sig Dispense Refill  . albuterol (PROAIR HFA) 108 (90 Base) MCG/ACT inhaler USE 2 INHALATIONS EVERY 6 HOURS AS NEEDED FOR WHEEZING OR SHORTNESS OF BREATH 51 g 3  . albuterol (PROVENTIL) (2.5 MG/3ML) 0.083% nebulizer solution Take 3 mLs (2.5 mg dose) by nebulization every 6 (six) hours as needed for Wheezing. 360 mL 0  . Calcium Citrate-Vitamin D (CITRACAL + D PO) Take 2 tablets by mouth  daily.    . clobetasol cream (TEMOVATE) 6.43 % Apply 1 application topically daily as needed (irritation).    Marland Kitchen dextromethorphan-guaiFENesin (MUCINEX DM) 30-600 MG per 12 hr tablet Take 1 tablet by mouth every 12 (twelve) hours.    . diclofenac sodium (VOLTAREN) 1 % GEL as needed.    . diltiazem (CARDIZEM CD) 240 MG 24 hr capsule Take 240 mg by mouth daily.    . fluticasone (FLOVENT HFA) 110 MCG/ACT inhaler USE 2 INHALATIONS BID 36 g 3  . furosemide (LASIX) 40 MG tablet Take 60 mg by mouth daily. Can take additional dose if weigh increase >2 lbs in 2 days    . glucosamine-chondroitin 500-400 MG tablet Take 1 tablet by mouth in the morning, at noon, in the evening, and at bedtime. Patient taking 4 times daily.    . hydrALAZINE (APRESOLINE) 50 MG tablet TAKE 1 TABLET THREE TIMES A DAY 270 tablet 3  . levothyroxine (SYNTHROID, LEVOTHROID) 88 MCG tablet Take 88 mcg by mouth daily.    Marland Kitchen losartan (COZAAR) 100 MG tablet Take 100 mg by mouth daily.    . Multiple Vitamins-Minerals (MULTIVITAMIN WITH MINERALS) tablet Take 1 tablet by mouth daily.    . naproxen sodium (ALEVE) 220 MG tablet Take 220 mg by mouth daily as needed.    . NON FORMULARY Mega b stress once a day    . Polyethyl Glycol-Propyl Glycol 0.4-0.3 % SOLN 1 drop as needed.    . potassium chloride SA (KLOR-CON) 20 MEQ tablet Take 20 mEq by mouth daily.    Marland Kitchen  sertraline (ZOLOFT) 100 MG tablet Take 100 mg by mouth 2 (two) times daily.      No current facility-administered medications on file prior to visit.    There are no discontinued medications.  Radiology:   No results found.  Cardiac Studies:    Lexiscan Myoview stress test 01/17/2019: Lexiscan stress test was performed. Stress EKG is non-diagnostic, as this is pharmacological stress test. Pharmacological myocardial perfusion imaging is normal. LVEF 69%. Low risk study.   Echocardiogram 12/07/2019:  Left ventricle cavity is normal in size. Moderate concentric hypertrophy  of the  left ventricle. Normal global wall motion. Normal LV systolic function with EF 57%. Doppler evidence of grade II (pseudonormal)  diastolic dysfunction, elevated LAP.  Left atrial cavity is mildly dilated.  Mild (Grade I) mitral regurgitation.  Mild tricuspid regurgitation. RA-RV gradient 20 mmHg.  No significant change compared to previous study on 12/26/2018.    EKG    EKG 07/14/2020: Sinus rhythm at the rate of 63 bpm with borderline first-degree AV block, no evidence of ischemia, normal EKG otherwise.    10/17/2019: Marked sinus bradycardia at rate of 54 bpm with first-degree AV block, normal axis, no evidence of ischemia.  Abnormal EKG. COMPARED TO EKG 11/24/2018: Sinus rhythm/sinus arrhythmia at 64 bpm.  Assessment     ICD-10-CM   1. Shortness of breath  R06.02 Brain natriuretic peptide    CBC    Basic metabolic panel    DG Chest 2 View  2. Mild persistent asthma with (acute) exacerbation  J45.31   3. Chronic diastolic heart failure (HCC)  I50.32 Brain natriuretic peptide    Meds ordered this encounter  Medications  . predniSONE (DELTASONE) 20 MG tablet    Sig: Take 3 tabs together for 2 days then 2 tabs together for 2 days then 1 tab for 2 days then 0.5 tab for 2 days.    Dispense:  13 tablet    Refill:  0  There are no discontinued medications.  Orders Placed This Encounter  Procedures  . DG Chest 2 View    Standing Status:   Future    Number of Occurrences:   1    Standing Expiration Date:   11/02/2020    Order Specific Question:   Reason for Exam (SYMPTOM  OR DIAGNOSIS REQUIRED)    Answer:   dyspnea on exertion    Order Specific Question:   Preferred imaging location?    Answer:   [facility]    Order Specific Question:   Radiology Contrast Protocol - do NOT remove file path    Answer:   [email or web address]  . Brain natriuretic peptide  . CBC  . Basic metabolic panel     Recommendations:   [name]  is a  80 y.o.  female  with hypothyroidism, hypertension, depression/anxiety, OSA on CPAP, history of left breast cancer, hypocalcemia, CKD stage 3, chronic diastolic heart failure.   Patient is presently enrolled in our p.m. and PCM for heart failure management with our clinic.  Notably blood pressure remains well controlled and although weight increased while she was on prednisone recently for asthma exacerbation, her weight has trended down since then.  She now presents for urgent visit with complaints of shortness of breath worsening over the last 2-3 days.  There are no clinical signs of acute decompensated heart failure.  However will obtainBMP, BNP, CBC to further evaluate underlying etiology of symptoms.  We will also obtain chest x-ray.  Patient symptoms  and physical exam are consistent with acute asthma exacerbation, rather than underlying cardiac etiology.  Will start patient on prednisone taper for acute asthma exacerbation.  Also advised patient to take albuterol either inhaler or nebulizer every 4-6 hours.  Discussed with patient regarding dosing instructions, she verbalized understanding and agreement.  Will notify patient's pulmonary team of recommended treatment, will defer further management to them.  Discussed with patient signs and symptoms that would warrant emergent evaluation and treatment in the emergency department, she verbalized understanding and agreement.  Patient also agreed to seek treatment in the emergency department if symptoms have not improved in the next 48 hours.  Follow up in 2 weeks, sooner if needed, for shortness of breath.   Patient was seen in collaboration with Dr. Einar Gip. He reviewed patient's chart and is in agreement with the plan.     Alethia Berthold, PA-C 09/07/2020, 6:51 PM Office: [phone]  CC: Dr. Chesley Mires

## 2020-09-03 LAB — BASIC METABOLIC PANEL
BUN/Creatinine Ratio: 19 (ref 12–28)
BUN: 17 mg/dL (ref 8–27)
CO2: 22 mmol/L (ref 20–29)
Calcium: 9.8 mg/dL (ref 8.7–10.3)
Chloride: 104 mmol/L (ref 96–106)
Creatinine, Ser: 0.88 mg/dL (ref 0.57–1.00)
Glucose: 100 mg/dL — ABNORMAL HIGH (ref 65–99)
Potassium: 3.4 mmol/L — ABNORMAL LOW (ref 3.5–5.2)
Sodium: 145 mmol/L — ABNORMAL HIGH (ref 134–144)
eGFR: 68 mL/min/{1.73_m2} (ref >59)

## 2020-09-03 LAB — CBC
Hematocrit: 36 % (ref 34.0–46.6)
Hemoglobin: 12.2 g/dL (ref 11.1–15.9)
MCH: 33.7 pg — ABNORMAL HIGH (ref 26.6–33.0)
MCHC: 33.9 g/dL (ref 31.5–35.7)
MCV: 99 fL — ABNORMAL HIGH (ref 79–97)
Platelets: 185 10*3/uL (ref 150–450)
RBC: 3.62 x10E6/uL — ABNORMAL LOW (ref 3.77–5.28)
RDW: 12.4 % (ref 11.7–15.4)
WBC: 7.8 10*3/uL (ref 3.4–10.8)

## 2020-09-03 LAB — BRAIN NATRIURETIC PEPTIDE: BNP: 53.1 pg/mL (ref 0.0–100.0)

## 2020-09-03 NOTE — Progress Notes (Signed)
Please inform patient BNP, CBC, and BMP normal.  No evidence of acute heart failure.

## 2020-09-03 NOTE — Progress Notes (Signed)
Please inform patient chest x-ray did show mild cardiomegaly and trace right pleural effusion.  Please advise patient to take additional 40 mg of furosemide in the afternoon for 3 days then return to just 60 mg daily.

## 2020-09-04 NOTE — Progress Notes (Signed)
Called spoke with pt regarding chest x-ray. Pt voiced understanding.

## 2020-09-04 NOTE — Progress Notes (Signed)
Called and spoke to pt regarding lab results. Pt voiced understanding.

## 2020-09-16 ENCOUNTER — Ambulatory Visit: Payer: Medicare HMO | Admitting: Student

## 2020-09-18 NOTE — Progress Notes (Signed)
Primary Physician/Referring:  [name]  Patient ID: [name], female    DOB: September 04, 1940, 80 y.o.   MRN: [id number]  Chief Complaint  Patient presents with  . Shortness of Breath  . Congestive Heart Failure  . Follow-up    2 week   HPI:    [name]  is a 80 y.o. Caucasian female  with hypothyroidism, hypertension, depression/anxiety, OSA on CPAP, history of left breast cancer, hypocalcemia, CKD stage 3. She did not tolerate Bisoprolol,  labetalol due to worsening asthma and Aldactone caused worsening renal function.   Patient presents for 2-week follow-up of shortness of breath.  At last visit patient felt to be in acute asthma exacerbation as there were no clinical signs of heart failure, therefore prescribed prednisone taper and recommended follow-up with her pulmonary team.  Patient continues to have shortness of breath and fatigue, however her symptoms have significantly improved since last visit.  Notably her blood pressure is elevated in the office today, however this may be due to frequent use of rescue inhalers.  Patient's weight has continued to trend down.  Her BNP following last visit was normal.  Unfortunately patient had this not yet followed up with pulmonary provider.  She denies chest pain, palpitations, dizziness, syncope, near syncope, leg swelling, orthopnea.   Past Medical History:  Diagnosis Date  . Anemia    PMH  . Anxiety   . Asthma   . Breast cancer ([place]) 1988   left breast cancer  . Collagenous colitis   . Compression fracture    lumbar 1  . Hypertension   . Hypothyroidism   . Osteoarthritis   . Osteopenia   . PONV (postoperative nausea and vomiting)   . Sleep apnea    wears CPAP nightly   Past Surgical History:  Procedure Laterality Date  . BREAST REDUCTION SURGERY     Right  . CATARACT EXTRACTION W/ INTRAOCULAR LENS  IMPLANT, BILATERAL    . CESAREAN SECTION    . COLONOSCOPY W/ BIOPSIES AND POLYPECTOMY    . CYST  EXCISION N/A 08/30/2018   Procedure: excision of posterior shoulder / back sebaceous cyst;  Surgeon: Wallace Going, DO;  Location: [facility];  Service: Plastics;  Laterality: N/A;  . KYPHOPLASTY N/A 02/18/2016   Procedure: LUMBAR 1 KYPHOPLASTY;  Surgeon: Phylliss Bob, MD;  Location: [facility];  Service: Orthopedics;  Laterality: N/A;  LUMBAR 1 KYPHOPLASTY  . MODIFIED RADICAL MASTECTOMY W/ AXILLARY LYMPH NODE DISSECTION     Left  . RECONSTRUCTION BREAST W/ LATISSIMUS DORSI FLAP     left  . REDUCTION MAMMAPLASTY Right   . right foot surgery     corrected hammer toe, straightened big toe  . SEPTOPLASTY    . TOE SURGERY    . TONSILLECTOMY AND ADENOIDECTOMY     Family History  Problem Relation Age of Onset  . Lung cancer Mother   . Hypertension Mother   . Ovarian cancer Sister   . Breast cancer Maternal Aunt   . Brain cancer Maternal Aunt   . Breast cancer Other        cousin  . Ovarian cancer Other        cousin  . Kidney cancer Other        cousin  . Kidney cancer Paternal Uncle   . Prostate cancer Paternal Uncle   . Hypertension Father   . Hypertension Brother     Social History   Tobacco Use  .  Smoking status: Never Smoker  . Smokeless tobacco: Never Used  Substance Use Topics  . Alcohol use: Yes    Alcohol/week: 1.0 standard drink    Types: 1 Glasses of wine per week    Comment: every night   Marital Status: Married  ROS  Review of Systems  Constitutional: Positive for malaise/fatigue (improving). Negative for weight gain.  Cardiovascular: Positive for dyspnea on exertion (improving). Negative for chest pain, claudication, leg swelling, near-syncope, orthopnea, palpitations, paroxysmal nocturnal dyspnea and syncope.  Respiratory: Positive for shortness of breath. Negative for wheezing.   Hematologic/Lymphatic: Does not bruise/bleed easily.  Gastrointestinal: Negative for melena.  Neurological: Negative for dizziness and weakness.   Objective   Blood pressure (!) 149/70, pulse 77, temperature 98.1 F (36.7 C), resp. rate 16, height 5' 1" (1.549 m), weight 251 lb 8 oz (114.1 kg), SpO2 93 %.  Vitals with BMI 09/22/2020 09/02/2020 07/14/2020  Height 5' 1" 5' 1.5" 5' 1.5"  Weight 251 lbs 8 oz 248 lbs 258 lbs 6 oz  BMI 47.55 85.27 78.24  Systolic 235 361 443  Diastolic 70 63 69  Pulse 77 70 71     Physical Exam Vitals reviewed.  Constitutional:      General: She is not in acute distress.    Appearance: She is well-developed. She is obese. She is not ill-appearing.  HENT:     Head: Normocephalic and atraumatic.  Cardiovascular:     Rate and Rhythm: Normal rate and regular rhythm.     Pulses: Normal pulses and intact distal pulses.          Carotid pulses are 2+ on the right side and 2+ on the left side.      Dorsalis pedis pulses are 2+ on the right side and 2+ on the left side.       Posterior tibial pulses are 2+ on the right side and 2+ on the left side.     Heart sounds: S1 normal and S2 normal. No murmur heard. No gallop.      Comments: No JVD. Pulmonary:     Effort: Pulmonary effort is normal. No accessory muscle usage or respiratory distress.     Breath sounds: Examination of the left-middle field reveals decreased breath sounds. Examination of the right-lower field reveals decreased breath sounds. Examination of the left-lower field reveals decreased breath sounds. Decreased breath sounds and wheezing present. No rhonchi or rales.  Musculoskeletal:     Right lower leg: Edema (1+ pitting, stable) present.     Left lower leg: Edema (1+ pitting stable) present.  Skin:    General: Skin is warm and dry.  Neurological:     General: No focal deficit present.     Mental Status: She is alert.  Psychiatric:        Mood and Affect: Mood normal.        Behavior: Behavior normal.    Laboratory examination:   Recent Labs    11/30/19 1344 09/02/20 1528  NA 145* 145*  K 3.8 3.4*  CL 104 104  CO2 24 22  GLUCOSE 107* 100*   BUN 24 17  CREATININE 1.02* 0.88  CALCIUM 10.2 9.8  GFRNONAA 54*  --   GFRAA 62  --    estimated creatinine clearance is 62.8 mL/min (by C-G formula based on SCr of 0.88 mg/dL).  CMP Latest Ref Rng & Units 09/02/2020 11/30/2019 12/08/2018  Glucose 65 - 99 mg/dL 100(H) 107(H) 113(H)  BUN 8 - 27 mg/dL 17  24 19  Creatinine 0.57 - 1.00 mg/dL 0.88 1.02(H) 1.01(H)  Sodium 134 - 144 mmol/L 145(H) 145(H) 142  Potassium 3.5 - 5.2 mmol/L 3.4(L) 3.8 3.7  Chloride 96 - 106 mmol/L 104 104 103  CO2 20 - 29 mmol/L _0 Calcium 8.7 - 10.3 mg/dL 9.8 10.2 9.7  Total Protein 6.5 - 8.1 g/dL - - -  Total Bilirubin 0.3 - 1.2 mg/dL - - -  Alkaline Phos 38 - 126 U/L - - -  AST 15 - 41 U/L - - -  ALT 14 - 54 U/L - - -   CBC Latest Ref Rng & Units 09/02/2020 02/18/2016 12/17/2014  WBC 3.4 - 10.8 x10E3/uL 7.8 5.9 5.7  Hemoglobin 11.1 - 15.9 g/dL 12.2 13.8 14.0  Hematocrit 34.0 - 46.6 % 36.0 42.5 41.6  Platelets 150 - 450 x10E3/uL 185 168 185   Lipid Panel  No results found for: CHOL, TRIG, HDL, CHOLHDL, VLDL, LDLCALC, LDLDIRECT HEMOGLOBIN A1C No results found for: HGBA1C, MPG TSH Recent Labs    11/30/19 1345  TSH 4.720*   BNP (last 3 results) Recent Labs    11/30/19 1342 09/02/20 1528  BNP 177.0* 53.1    External labs: Labs 04/01/2020:  A1c 5.1%.  TSH normal.  Hb 12.6/HCT 38.4, mild macrocytosis present, platelets 172.  Serum glucose 94 mg, BUN 17, creatinine 0.86, EGFR 66 mL, sodium 142, potassium 3.6, CMP otherwise normal.  Total cholesterol 217, triglycerides 68, HDL 118, LDL 87. 03/21/2019: BUN 22 creatinine 0.97, EGFR 57 mL, sodium 144, potassium 3.9. Total cholesterol 185, triglycerides 126, HDL 87, LDL 77.  Medications and allergies   Allergies  Allergen Reactions  . Nebivolol Hcl Other (See Comments)  . Ibuprofen Swelling    Patient reports swelling of legs   . Adhesive [Tape] Other (See Comments)    UNSPECIFIED   . Bactrim [Sulfamethoxazole-Trimethoprim] Other (See  Comments)    UNSPECIFIED   . Dairy Aid [Lactase] Other (See Comments)    Ice cream, sour cream  . Lactose Intolerance (Gi) Other (See Comments)  . Microplegia Msa-Msg [Plegisol] Other (See Comments)  . Sulfa Antibiotics Other (See Comments)    UNSPECIFIED   . Meloxicam Nausea Only    Current Outpatient Medications on File Prior to Visit  Medication Sig Dispense Refill  . albuterol (PROAIR HFA) 108 (90 Base) MCG/ACT inhaler USE 2 INHALATIONS EVERY 6 HOURS AS NEEDED FOR WHEEZING OR SHORTNESS OF BREATH 51 g 3  . albuterol (PROVENTIL) (2.5 MG/3ML) 0.083% nebulizer solution Take 3 mLs (2.5 mg dose) by nebulization every 6 (six) hours as needed for Wheezing. 360 mL 0  . Calcium Citrate-Vitamin D (CITRACAL + D PO) Take 2 tablets by mouth daily.    . clobetasol cream (TEMOVATE) 4.15 % Apply 1 application topically daily as needed (irritation).    Marland Kitchen dextromethorphan-guaiFENesin (MUCINEX DM) 30-600 MG per 12 hr tablet Take 1 tablet by mouth every 12 (twelve) hours.    . diclofenac sodium (VOLTAREN) 1 % GEL as needed.    . diltiazem (CARDIZEM CD) 240 MG 24 hr capsule Take 240 mg by mouth daily.    . fluticasone (FLOVENT HFA) 110 MCG/ACT inhaler USE 2 INHALATIONS BID 36 g 3  . furosemide (LASIX) 40 MG tablet Take 60 mg by mouth daily. Can take additional dose if weigh increase >2 lbs in 2 days    . glucosamine-chondroitin 500-400 MG tablet Take 1 tablet by mouth in the morning, at noon, in the evening, and at  bedtime. Patient taking 4 times daily.    . hydrALAZINE (APRESOLINE) 50 MG tablet TAKE 1 TABLET THREE TIMES A DAY 270 tablet 3  . levothyroxine (SYNTHROID, LEVOTHROID) 88 MCG tablet Take 88 mcg by mouth daily.    Marland Kitchen losartan (COZAAR) 100 MG tablet Take 100 mg by mouth daily.    . Multiple Vitamins-Minerals (MULTIVITAMIN WITH MINERALS) tablet Take 1 tablet by mouth daily.    . naproxen sodium (ALEVE) 220 MG tablet Take 220 mg by mouth daily as needed.    . NON FORMULARY Mega b stress once a day     . Polyethyl Glycol-Propyl Glycol 0.4-0.3 % SOLN 1 drop as needed.    . potassium chloride SA (KLOR-CON) 20 MEQ tablet Take 20 mEq by mouth daily. 60 mg daily    . sertraline (ZOLOFT) 100 MG tablet Take 100 mg by mouth 2 (two) times daily.      No current facility-administered medications on file prior to visit.    Medications Discontinued During This Encounter  Medication Reason  . predniSONE (DELTASONE) 20 MG tablet Error    Radiology:   No results found.  Chest x-ray 09/02/2020: Mild cardiomegaly without overt pulmonary edema. Patchy atelectasis at the bases. Trace right pleural effusion or thickening. No pneumothorax. Old right-sided rib deformities. Treated compression deformity at the thoracolumbar junction.  IMPRESSION: Mild cardiomegaly without overt edema. Patchy atelectasis at the bases with trace right pleural effusion or thickening.  Cardiac Studies:    Lexiscan Myoview stress test 01/17/2019: Lexiscan stress test was performed. Stress EKG is non-diagnostic, as this is pharmacological stress test. Pharmacological myocardial perfusion imaging is normal. LVEF 69%. Low risk study.   Echocardiogram 12/07/2019:  Left ventricle cavity is normal in size. Moderate concentric hypertrophy  of the left ventricle. Normal global wall motion. Normal LV systolic function with EF 57%. Doppler evidence of grade II (pseudonormal)  diastolic dysfunction, elevated LAP.  Left atrial cavity is mildly dilated.  Mild (Grade I) mitral regurgitation.  Mild tricuspid regurgitation. RA-RV gradient 20 mmHg.  No significant change compared to previous study on 12/26/2018.    EKG    EKG 07/14/2020: Sinus rhythm at the rate of 63 bpm with borderline first-degree AV block, no evidence of ischemia, normal EKG otherwise.    10/17/2019: Marked sinus bradycardia at rate of 54 bpm with first-degree AV block, normal axis, no evidence of ischemia.  Abnormal EKG. COMPARED TO EKG 11/24/2018: Sinus  rhythm/sinus arrhythmia at 64 bpm.  Assessment     ICD-10-CM   1. Shortness of breath  R06.02   2. Mild persistent asthma with (acute) exacerbation  J45.31   3. Chronic diastolic heart failure (HCC)  I50.32   4. Essential hypertension  I10     No orders of the defined types were placed in this encounter.  Medications Discontinued During This Encounter  Medication Reason  . predniSONE (DELTASONE) 20 MG tablet Error    No orders of the defined types were placed in this encounter.    Recommendations:   RUPA LAGAN  is a  80 y.o.  female  with hypothyroidism, hypertension, depression/anxiety, OSA on CPAP, history of left breast cancer, hypocalcemia, CKD stage 3, chronic diastolic heart failure.   Patient is presently enrolled in our p.m. and PCM for heart failure management with our clinic.    Patient presents for 2-week follow-up of shortness of breath.  At last visit patient reports felt to be in acute asthma exacerbation as there were no clinical signs of  heart failure, therefore prescribed prednisone taper and recommended follow-up with her pulmonary team.  Unfortunately she has not followed up with her pulmonary team at this time.  Patient continues to have symptoms of dyspnea and fatigue.  However there is no apparent cardiac etiology of the symptoms as her BNP is normal and there are no clinical signs of heart failure.  Reviewed and discussed with patient regarding results of recent labs as well as chest x-ray.  Suspect symptoms are due to underlying pulmonary etiology, as evaluation thus far has been unyielding for cardiac etiology.  Can recommend patient follow-up with pulmonology for further evaluation of her symptoms.  If pulmonary work-up was unyielding, could consider repeat echocardiogram and/or stress test, however suspicion for cardiac etiology is low.  In regard to hypertension, suspect blood pressure is presently elevated due to increased use of albuterol.  We will hold  off on making adjustments to antihypertensive medications at this time, however in the future will reevaluate blood pressure management at next appointment.   Alethia Berthold, PA-C 09/22/2020, 4:38 PM Office: [phone]

## 2020-09-22 ENCOUNTER — Encounter: Payer: Self-pay | Admitting: Student

## 2020-09-22 ENCOUNTER — Other Ambulatory Visit: Payer: Self-pay

## 2020-09-22 ENCOUNTER — Telehealth: Payer: Self-pay | Admitting: Pulmonary Disease

## 2020-09-22 ENCOUNTER — Ambulatory Visit: Payer: Medicare HMO | Admitting: Student

## 2020-09-22 VITALS — BP 149/70 | HR 77 | Temp 98.1°F | Resp 16 | Ht 61.0 in | Wt 251.5 lb

## 2020-09-22 DIAGNOSIS — R0602 Shortness of breath: Secondary | ICD-10-CM

## 2020-09-22 DIAGNOSIS — I5032 Chronic diastolic (congestive) heart failure: Secondary | ICD-10-CM

## 2020-09-22 DIAGNOSIS — I10 Essential (primary) hypertension: Secondary | ICD-10-CM

## 2020-09-22 DIAGNOSIS — J4531 Mild persistent asthma with (acute) exacerbation: Secondary | ICD-10-CM

## 2020-09-22 NOTE — Telephone Encounter (Signed)
Called and spoke to patient,. Patient reports of increased sob and wheezing.  Sx have been present for 3-4wk.  Patient seen cardiology and had labs and CXR. CXR and labs ruled out cardiac as cause of SOB.  She stated that she is not able to walk to the kitchen without stopping to catch her breath. She is using Flovent BID, albuterol nebulizer once daily and albuterol HFA BID with temporary relief in sx.  Fully vaccinated against covid and flu. Patient would like OV.   Dr. Halford Chessman, please advise if okay for patient to come in with current sx. Thanks

## 2020-09-22 NOTE — Telephone Encounter (Signed)
Spoke with the pt and scheduled appt with TP for tomorrow at 9:00 am

## 2020-09-22 NOTE — Telephone Encounter (Signed)
She needs an ROV to assess further.

## 2020-09-23 ENCOUNTER — Ambulatory Visit (HOSPITAL_COMMUNITY)
Admission: RE | Admit: 2020-09-23 | Discharge: 2020-09-23 | Disposition: A | Discharge status: Home / Self Care | Payer: Medicare HMO | Source: Ambulatory Visit | Attending: Adult Health | Admitting: Adult Health

## 2020-09-23 ENCOUNTER — Telehealth: Payer: Self-pay | Admitting: Adult Health

## 2020-09-23 ENCOUNTER — Ambulatory Visit (INDEPENDENT_AMBULATORY_CARE_PROVIDER_SITE_OTHER): Payer: Medicare HMO

## 2020-09-23 ENCOUNTER — Encounter: Payer: Self-pay | Admitting: Adult Health

## 2020-09-23 ENCOUNTER — Ambulatory Visit (INDEPENDENT_AMBULATORY_CARE_PROVIDER_SITE_OTHER): Payer: Medicare HMO | Admitting: Adult Health

## 2020-09-23 VITALS — BP 132/74 | HR 75 | Temp 98.3°F | Resp 25 | Ht 61.5 in | Wt 250.0 lb

## 2020-09-23 DIAGNOSIS — J9611 Chronic respiratory failure with hypoxia: Secondary | ICD-10-CM | POA: Insufficient documentation

## 2020-09-23 DIAGNOSIS — R0600 Dyspnea, unspecified: Secondary | ICD-10-CM | POA: Diagnosis not present

## 2020-09-23 DIAGNOSIS — G4733 Obstructive sleep apnea (adult) (pediatric): Secondary | ICD-10-CM

## 2020-09-23 DIAGNOSIS — J4530 Mild persistent asthma, uncomplicated: Secondary | ICD-10-CM

## 2020-09-23 DIAGNOSIS — J4531 Mild persistent asthma with (acute) exacerbation: Secondary | ICD-10-CM | POA: Diagnosis not present

## 2020-09-23 DIAGNOSIS — R0609 Other forms of dyspnea: Secondary | ICD-10-CM

## 2020-09-23 LAB — D-DIMER, QUANTITATIVE: D-Dimer, Quant: 0.93 mcg/mL FEU — ABNORMAL HIGH (ref <0.50)

## 2020-09-23 MED ORDER — IOHEXOL 350 MG/ML SOLN
75.0000 mL | Freq: Once | INTRAVENOUS | Status: AC | PRN
  Administered 2020-09-23: 75 mL via INTRAVENOUS

## 2020-09-23 MED ORDER — BUDESONIDE-FORMOTEROL FUMARATE 160-4.5 MCG/ACT IN AERO
2.0000 | INHALATION_SPRAY | Freq: Two times a day (BID) | RESPIRATORY_TRACT | 5 refills | Status: DC
Start: 2020-09-23 — End: 2020-09-30

## 2020-09-23 MED ORDER — AMOXICILLIN-POT CLAVULANATE 875-125 MG PO TABS
1.0000 | ORAL_TABLET | Freq: Two times a day (BID) | ORAL | 0 refills | Status: AC
Start: 2020-09-23 — End: 2020-09-30

## 2020-09-23 MED ORDER — PREDNISONE 20 MG PO TABS
20.0000 mg | ORAL_TABLET | Freq: Every day | ORAL | 0 refills | Status: DC
Start: 2020-09-23 — End: 2020-09-30

## 2020-09-23 NOTE — Progress Notes (Signed)
Reviewed and agree with assessment/plan.   Hyun Marsalis, MD [facility] 09/23/2020, 11:15 AM Pager:  [phone]  

## 2020-09-23 NOTE — Addendum Note (Signed)
Addended by: Trudi Ida on: 09/23/2020 04:23 PM   Modules accepted: Orders

## 2020-09-23 NOTE — Assessment & Plan Note (Signed)
Acute on chronic respiratory failure.  Patient had exertional hypoxemia May 2021.  Use of oxygen for a brief amount of time.  Today in office she exertional hypoxemia.  O2 saturation 78% on room air.  Will need to be started on oxygen.  Patient's O2 saturation goals greater than 88 to 90%. Questionable etiology.  Patient does have diastolic heart failure but appears to be stable on exam.  Recent chest x-ray showed no evidence of volume overload.  BNP was normal.  Patient remains on Lasix 60 mg daily. Possible asthma exacerbation.  Recent COVID-19.  Check D-dimer. Patient will need close follow-up.  Plan  Patient Instructions  Chest xray and labs  Begin Oxgyen 2l/m, Goal is to have Oxygen sats >90% .  Change Flovent to Symbicort 160 2 puffs Twice daily   Albuterol As needed   Prednisone 20mg  daily for 5 days  Continue on Lasix  Low salt diet  Daily weights.  Follow up with in 1 week and As needed   Please contact office for sooner follow up if symptoms do not improve or worsen or seek emergency care

## 2020-09-23 NOTE — Assessment & Plan Note (Signed)
Possible slow to resolve asthmatic exacerbation however has minimum cough and wheezing.  Now with new onset exertional dyspnea.  Patient did have COVID-19 2 months ago.  Is sedentary with chronic leg swelling due to chronic diastolic heart failure and venous insufficiency.  Will check chest x-ray today.  We will treat with low-dose empiric steroids.  Change Flovent to Symbicort.  D-dimer is pending.  If D-dimer is elevated.  Will need to proceed with a CT chest angio to rule out possible VTE Will need close follow-up.  Patient advised if symptoms or not improving or worsen she will need sooner follow-up and possible emergency room care.  Plan  Patient Instructions  Chest xray and labs  Begin Oxgyen 2l/m, Goal is to have Oxygen sats >90% .  Change Flovent to Symbicort 160 2 puffs Twice daily   Albuterol As needed   Prednisone 20mg  daily for 5 days  Continue on Lasix  Low salt diet  Daily weights.  Follow up with in 1 week and As needed   Please contact office for sooner follow up if symptoms do not improve or worsen or seek emergency care

## 2020-09-23 NOTE — Addendum Note (Signed)
Addended by: Trudi Ida on: 09/23/2020 04:49 PM   Modules accepted: Orders

## 2020-09-23 NOTE — Addendum Note (Signed)
Addended by: Trudi Ida on: 09/23/2020 10:49 AM   Modules accepted: Orders

## 2020-09-23 NOTE — Progress Notes (Signed)
@Patient  ID: [name], female    DOB: Mar 03, 1941, 80 y.o.   MRN: [id number]  Chief Complaint  Patient presents with  . Follow-up    Referring provider: [name]  HPI: 80 year old female never smoker followed for obstructive sleep apnea and mild asthma. Chronic respiratory failure Medical history significant for diastolic heart failure, breast cancer or  TEST/EVENTS :   Spirometry 06/06/12>>FEV1 1.48 (69%), FVC 1.95 (70%), FEV1% 76  PFT 07/27/12 >> FEV1 1.71 (88%), FEV1% 77, FEF 25-75% 1.44 (64%), TLC 3.58 (77%), DLCO 72%, no BD.  Spirometry 10/24/17 >> FEV1 1.15 (63%), FEV1% 83  FeNO 10/24/17 >> 19  Sleep Tests:   PSG 12/01/06>>AHI 10, SpO2 low 80%, PLMI 0   CPAP titration 02/16/07>>CPAP 11 cm H2O  CPAP 01/25/20 to 02/23/20 >> used on 30 of 30 nights with average 10 hrs 56 min.  Average AHI 1.6 with CPAP 11 cm H2O.  Cardiac Tests:   Echo 12/07/19 >> EF 57%, grade 2 DD, mild MR   09/23/2020 Acute OV : Dyspnea and Hypoxia  Patient presents for an acute office visit.  Patient complains that she has had progressive shortness of breath low oxygen levels at home for last weeks.   Patient has a known history of diastolic heart failure followed by cardiology.  May 2021 patient had similar symptoms with a decompensated diastolic heart failure.  She was started on oxygen for hypoxemia.  Use the oxygen for about a month and then improved with discontinuation of oxygen.  Today in the office on arrival to the office patient's O2 saturations were 78% on room air.  Patient rested for 2 minutes with improvement in oxygen above 90% at rest on room air. Seen by cardiology 2 weeks ago felt to have an asthma exacerbation and given prednisone taper.  Office notes reviewed and felt diastolic heart failure as stable. Chart review shows Chest x-ray September 02, 2020 showed mild patchy atelectasis in the bases with a trace right pleural effusion.. Labs with normal white blood cell count  and hemoglobin hematocrit, BNP 53 and serum creatinine at 0.88.Marland Kitchen Potassium was slightly low at 3.4. No increased weight gain, or increased leg swelling .  Remains on Flovent Twice daily  . Last 3 weeks having to increase albuterol to 1-2 x day.  Feels that prednisone may have helped some but continues to have shortness of breath and intermittent wheezing.  Minimum cough.  No discolored mucus no fever. No recent travel , no calf pain , no hx of VTE, no hemoptysis,  Had Covid 19 07/18/20. (fully vaccinated) . Was sick with fatigue, headaches for about 2 weeks . Was down for about 2 weeks .  Independent , drives , lives with husband at home.   Doing well on CPAP, wears CPAP all night long.  Never misses a night.  Feels rested on CPAP.  CPAP download shows excellent compliance with 100% usage.  Daily average usage at 11 hours.  Patient is on CPAP 11 cm H2O.  AHI is 1.0.  Allergies  Allergen Reactions  . Nebivolol Hcl Other (See Comments)  . Ibuprofen Swelling    Patient reports swelling of legs   . Adhesive [Tape] Other (See Comments)    UNSPECIFIED   . Bactrim [Sulfamethoxazole-Trimethoprim] Other (See Comments)    UNSPECIFIED   . Dairy Aid [Lactase] Other (See Comments)    Ice cream, sour cream  . Lactose Intolerance (Gi) Other (See Comments)  . Microplegia Msa-Msg [Plegisol] Other (See  Comments)  . Sulfa Antibiotics Other (See Comments)    UNSPECIFIED   . Meloxicam Nausea Only    Immunization History  Administered Date(s) Administered  . Influenza Split 03/09/2013  . Influenza Whole 04/15/2012  . Influenza, High Dose Seasonal PF 04/29/2016, 03/07/2018, 03/20/2019, 03/24/2020, 05/04/2020  . Influenza,inj,Quad PF,6+ Mos 04/04/2014  . Influenza-Unspecified 05/04/2017  . PFIZER(Purple Top)SARS-COV-2 Vaccination 07/17/2019, 08/06/2019, 11/14/2019  . Pneumococcal Conjugate-13 04/04/2014  . Pneumococcal Polysaccharide-23 03/05/2009  . Td 11/06/2006  . Zoster 02/02/2013    Past  Medical History:  Diagnosis Date  . Anemia    PMH  . Anxiety   . Asthma   . Breast cancer ([place]) 1988   left breast cancer  . Collagenous colitis   . Compression fracture    lumbar 1  . Hypertension   . Hypothyroidism   . Osteoarthritis   . Osteopenia   . PONV (postoperative nausea and vomiting)   . Sleep apnea    wears CPAP nightly    Tobacco History: Social History   Tobacco Use  Smoking Status Never Smoker  Smokeless Tobacco Never Used   Counseling given: Not Answered   Outpatient Medications Prior to Visit  Medication Sig Dispense Refill  . albuterol (PROAIR HFA) 108 (90 Base) MCG/ACT inhaler USE 2 INHALATIONS EVERY 6 HOURS AS NEEDED FOR WHEEZING OR SHORTNESS OF BREATH 51 g 3  . albuterol (PROVENTIL) (2.5 MG/3ML) 0.083% nebulizer solution Take 3 mLs (2.5 mg dose) by nebulization every 6 (six) hours as needed for Wheezing. 360 mL 0  . Calcium Citrate-Vitamin D (CITRACAL + D PO) Take 2 tablets by mouth daily.    . clobetasol cream (TEMOVATE) 3.55 % Apply 1 application topically daily as needed (irritation).    Marland Kitchen dextromethorphan-guaiFENesin (MUCINEX DM) 30-600 MG per 12 hr tablet Take 1 tablet by mouth every 12 (twelve) hours.    . diclofenac sodium (VOLTAREN) 1 % GEL as needed.    . diltiazem (CARDIZEM CD) 240 MG 24 hr capsule Take 240 mg by mouth daily.    . furosemide (LASIX) 40 MG tablet Take 60 mg by mouth daily. Can take additional dose if weigh increase >2 lbs in 2 days    . glucosamine-chondroitin 500-400 MG tablet Take 1 tablet by mouth in the morning, at noon, in the evening, and at bedtime. Patient taking 4 times daily.    . hydrALAZINE (APRESOLINE) 50 MG tablet TAKE 1 TABLET THREE TIMES A DAY 270 tablet 3  . levothyroxine (SYNTHROID, LEVOTHROID) 88 MCG tablet Take 88 mcg by mouth daily.    Marland Kitchen losartan (COZAAR) 100 MG tablet Take 100 mg by mouth daily.    . Multiple Vitamins-Minerals (MULTIVITAMIN WITH MINERALS) tablet Take 1 tablet by mouth daily.    .  naproxen sodium (ALEVE) 220 MG tablet Take 220 mg by mouth daily as needed.    . NON FORMULARY Mega b stress once a day    . Polyethyl Glycol-Propyl Glycol 0.4-0.3 % SOLN 1 drop as needed.    . potassium chloride SA (KLOR-CON) 20 MEQ tablet Take 20 mEq by mouth daily. 60 mg daily    . sertraline (ZOLOFT) 100 MG tablet Take 100 mg by mouth 2 (two) times daily.     . fluticasone (FLOVENT HFA) 110 MCG/ACT inhaler USE 2 INHALATIONS BID 36 g 3   No facility-administered medications prior to visit.     Review of Systems:   Constitutional:   No  weight loss, night sweats,  Fevers, chills, + fatigue, or  lassitude.  HEENT:   No headaches,  Difficulty swallowing,  Tooth/dental problems, or  Sore throat,                No sneezing, itching, ear ache, nasal congestion, post nasal drip,   CV:  No chest pain,  Orthopnea, PND,  anasarca, dizziness, palpitations, syncope.   GI  No heartburn, indigestion, abdominal pain, nausea, vomiting, diarrhea, change in bowel habits, loss of appetite, bloody stools.   Resp:    No chest wall deformity  Skin: no rash or lesions.  GU: no dysuria, change in color of urine, no urgency or frequency.  No flank pain, no hematuria   MS:  No joint pain or swelling.  No decreased range of motion.  No back pain.    Physical Exam  BP 132/74 (BP Location: Right Arm, Patient Position: Sitting, Cuff Size: Normal)   Pulse 75   Temp 98.3 F (36.8 C) (Skin)   Resp (!) 25   Ht 5' 1.5" (1.562 m)   Wt 250 lb (113.4 kg)   SpO2 92%   BMI 46.47 kg/m   GEN: A/Ox3; pleasant , NAD, BMI 46   HEENT:  [place]/AT,   NOSE-clear, THROAT-clear, no lesions, no postnasal drip or exudate noted.   NECK:  Supple w/ fair ROM; no JVD; normal carotid impulses w/o bruits; no thyromegaly or nodules palpated; no lymphadenopathy.    RESP  Clear  P & A; w/o, wheezes/ rales/ or rhonchi. no accessory muscle use, no dullness to percussion  CARD:  RRR, no m/r/g, 1+ peripheral edema, pulses  intact, no cyanosis or clubbing.  GI:   Soft & nt; nml bowel sounds; no organomegaly or masses detected.   Musco: Warm bil, no deformities or joint swelling noted.   Neuro: alert, no focal deficits noted.    Skin: Warm, no lesions or rashes    Lab Results:  CBC    Component Value Date/Time   WBC 7.8 09/02/2020 1528   WBC 5.9 02/18/2016 1309   RBC 3.62 (L) 09/02/2020 1528   RBC 4.09 02/18/2016 1309   HGB 12.2 09/02/2020 1528   HGB 14.0 12/17/2014 1435   HCT 36.0 09/02/2020 1528   HCT 41.6 12/17/2014 1435   PLT 185 09/02/2020 1528   MCV 99 (H) 09/02/2020 1528   MCV 100.8 12/17/2014 1435   MCH 33.7 (H) 09/02/2020 1528   MCH 33.7 02/18/2016 1309   MCHC 33.9 09/02/2020 1528   MCHC 32.5 02/18/2016 1309   RDW 12.4 09/02/2020 1528   RDW 13.5 12/17/2014 1435   LYMPHSABS 1.1 02/18/2016 1309   LYMPHSABS 0.9 12/17/2014 1435   MONOABS 0.8 02/18/2016 1309   MONOABS 0.5 12/17/2014 1435   EOSABS 0.4 02/18/2016 1309   EOSABS 0.3 12/17/2014 1435   BASOSABS 0.0 02/18/2016 1309   BASOSABS 0.0 12/17/2014 1435    BMET    Component Value Date/Time   NA 145 (H) 09/02/2020 1528   NA 143 12/17/2014 1435   K 3.4 (L) 09/02/2020 1528   K 4.0 12/17/2014 1435   CL 104 09/02/2020 1528   CO2 22 09/02/2020 1528   CO2 25 12/17/2014 1435   GLUCOSE 100 (H) 09/02/2020 1528   GLUCOSE 104 (H) 08/25/2018 1440   GLUCOSE 103 12/17/2014 1435   BUN 17 09/02/2020 1528   BUN 16.9 12/17/2014 1435   CREATININE 0.88 09/02/2020 1528   CREATININE 0.9 12/17/2014 1435   CALCIUM 9.8 09/02/2020 1528   CALCIUM 9.8 12/17/2014 1435   GFRNONAA 54 (  L) 11/30/2019 1344   GFRAA 62 11/30/2019 1344    BNP    Component Value Date/Time   BNP 53.1 09/02/2020 1528    ProBNP No results found for: PROBNP  Imaging: DG Chest 2 View  Result Date: 09/02/2020 CLINICAL DATA:  Dyspnea on exertion EXAM: CHEST - 2 VIEW COMPARISON:  11/26/2019, 09/15/2015 FINDINGS: Mild cardiomegaly without overt pulmonary edema. Patchy  atelectasis at the bases. Trace right pleural effusion or thickening. No pneumothorax. Old right-sided rib deformities. Treated compression deformity at the thoracolumbar junction. IMPRESSION: Mild cardiomegaly without overt edema. Patchy atelectasis at the bases with trace right pleural effusion or thickening. Electronically Signed   By: Donavan Foil M.D.   On: 09/02/2020 16:40      No flowsheet data found.  No results found for: NITRICOXIDE      Assessment & Plan:   Asthma with acute exacerbation Possible slow to resolve asthmatic exacerbation however has minimum cough and wheezing.  Now with new onset exertional dyspnea.  Patient did have COVID-19 2 months ago.  Is sedentary with chronic leg swelling due to chronic diastolic heart failure and venous insufficiency.  Will check chest x-ray today.  We will treat with low-dose empiric steroids.  Change Flovent to Symbicort.  D-dimer is pending.  If D-dimer is elevated.  Will need to proceed with a CT chest angio to rule out possible VTE Will need close follow-up.  Patient advised if symptoms or not improving or worsen she will need sooner follow-up and possible emergency room care.  Plan  Patient Instructions  Chest xray and labs  Begin Oxgyen 2l/m, Goal is to have Oxygen sats >90% .  Change Flovent to Symbicort 160 2 puffs Twice daily   Albuterol As needed   Prednisone 20mg  daily for 5 days  Continue on Lasix  Low salt diet  Daily weights.  Follow up with in 1 week and As needed   Please contact office for sooner follow up if symptoms do not improve or worsen or seek emergency care          OSA (obstructive sleep apnea) Excellent control on CPAP.  No changes.  Chronic respiratory failure with hypoxia (HCC) Acute on chronic respiratory failure.  Patient had exertional hypoxemia May 2021.  Use of oxygen for a brief amount of time.  Today in office she exertional hypoxemia.  O2 saturation 78% on room air.  Will need to be  started on oxygen.  Patient's O2 saturation goals greater than 88 to 90%. Questionable etiology.  Patient does have diastolic heart failure but appears to be stable on exam.  Recent chest x-ray showed no evidence of volume overload.  BNP was normal.  Patient remains on Lasix 60 mg daily. Possible asthma exacerbation.  Recent COVID-19.  Check D-dimer. Patient will need close follow-up.  Plan  Patient Instructions  Chest xray and labs  Begin Oxgyen 2l/m, Goal is to have Oxygen sats >90% .  Change Flovent to Symbicort 160 2 puffs Twice daily   Albuterol As needed   Prednisone 20mg  daily for 5 days  Continue on Lasix  Low salt diet  Daily weights.  Follow up with in 1 week and As needed   Please contact office for sooner follow up if symptoms do not improve or worsen or seek emergency care             Rexene Edison, NP 09/23/2020

## 2020-09-23 NOTE — Telephone Encounter (Signed)
[place], Mickeal Skinner, Leeroy Cha, [place] Order placed incorrectly calling into office   [place]

## 2020-09-23 NOTE — Assessment & Plan Note (Signed)
Excellent control on CPAP.  No changes.

## 2020-09-23 NOTE — Telephone Encounter (Signed)
Order sent earlier not on o2 template  I have placed a new order to [facility] for o2

## 2020-09-23 NOTE — Patient Instructions (Addendum)
Chest xray and labs  Begin Oxgyen 2l/m, Goal is to have Oxygen sats >90% .  Change Flovent to Symbicort 160 2 puffs Twice daily   Albuterol As needed   Prednisone 20mg  daily for 5 days  Continue on Lasix  Low salt diet  Daily weights.  Follow up with in 1 week and As needed   Please contact office for sooner follow up if symptoms do not improve or worsen or seek emergency care

## 2020-09-24 NOTE — Telephone Encounter (Signed)
Tried calling pt, NA and no VM  Called [facility] and they do not open until after 9:00 am  Will try back later

## 2020-09-30 ENCOUNTER — Ambulatory Visit: Payer: Medicare HMO

## 2020-09-30 ENCOUNTER — Other Ambulatory Visit: Payer: Self-pay

## 2020-09-30 ENCOUNTER — Encounter: Payer: Self-pay | Admitting: Adult Health

## 2020-09-30 ENCOUNTER — Ambulatory Visit (INDEPENDENT_AMBULATORY_CARE_PROVIDER_SITE_OTHER): Payer: Medicare HMO | Admitting: Adult Health

## 2020-09-30 VITALS — BP 112/76 | HR 67 | Temp 97.7°F | Ht 61.0 in | Wt 253.4 lb

## 2020-09-30 DIAGNOSIS — J4531 Mild persistent asthma with (acute) exacerbation: Secondary | ICD-10-CM | POA: Diagnosis not present

## 2020-09-30 DIAGNOSIS — J9611 Chronic respiratory failure with hypoxia: Secondary | ICD-10-CM | POA: Diagnosis not present

## 2020-09-30 DIAGNOSIS — G4733 Obstructive sleep apnea (adult) (pediatric): Secondary | ICD-10-CM

## 2020-09-30 DIAGNOSIS — J189 Pneumonia, unspecified organism: Secondary | ICD-10-CM | POA: Diagnosis not present

## 2020-09-30 MED ORDER — BUDESONIDE-FORMOTEROL FUMARATE 160-4.5 MCG/ACT IN AERO: 2.0000 | INHALATION_SPRAY | Freq: Two times a day (BID) | RESPIRATORY_TRACT | 3 refills | Status: DC

## 2020-09-30 NOTE — Assessment & Plan Note (Signed)
Recent flareup with associated pneumonia.  Now improving.  Continue continue on Symbicort.

## 2020-09-30 NOTE — Assessment & Plan Note (Signed)
Community-acquired pneumonia-patient has clinical improvement after antibiotics. Clinically she is improving.  We will have her finish up antibiotics.  Continue on aggressive mucociliary clearance regimen .  We will recheck chest x-ray on return in 3 to 4 weeks.  Plan  Patient Instructions  Finish Augmentin today .  Continue on Oxgyen 2l/m with activity as needed. , Goal is to have Oxygen sats >90% .  Continue on Symbicort 160 2 puffs Twice daily   Albuterol As needed   Continue on CPAP At bedtime  .   Follow up with in 3-4 weeks with chest xray Dr. Halford Chessman  Or Mareta Chesnut NP  Please contact office for sooner follow up if symptoms do not improve or worsen or seek emergency care

## 2020-09-30 NOTE — Patient Instructions (Addendum)
Finish Augmentin today .  Continue on Oxgyen 2l/m with activity as needed. , Goal is to have Oxygen sats >90% .  Continue on Symbicort 160 2 puffs Twice daily   Albuterol As needed   Continue on CPAP At bedtime  .   Follow up with in 3-4 weeks with chest xray Dr. Halford Chessman  Or [place] Lesure NP  Please contact office for sooner follow up if symptoms do not improve or worsen or seek emergency care

## 2020-09-30 NOTE — Assessment & Plan Note (Signed)
Decreased oxygen demands.  Patient is to maintain O2 saturations greater than 88 to 90%.  If no further desaturations on return visit can discontinue oxygen at follow-up visit.

## 2020-09-30 NOTE — Telephone Encounter (Signed)
[place] and was advised the generic Symbicort needs a PA but the brand name didn't. The tech states the pt picked up the brand name Symbicort and paid a co-pay of $47. Pt has an appt today with TP 12pm. Will forward to her as FYI.

## 2020-09-30 NOTE — Assessment & Plan Note (Signed)
Continue on CPAP at bedtime.  Doing well.

## 2020-09-30 NOTE — Progress Notes (Signed)
@Patient  ID: [name], female    DOB: 16-Mar-1941, 80 y.o.   MRN: [id number]  Chief Complaint  Patient presents with  . Follow-up    Referring provider: [name]  HPI: 80 year old female never smoker followed for obstructive sleep apnea and mild asthma Previous chronic respiratory failure with oxygen briefly in May 2021 felt secondary to diastolic heart failure exacerbation.  Used it for 1 month and then returned the oxygen.   TEST/EVENTS :   Spirometry 06/06/12>>FEV1 1.48 (69%), FVC 1.95 (70%), FEV1% 76  PFT 07/27/12 >> FEV1 1.71 (88%), FEV1% 77, FEF 25-75% 1.44 (64%), TLC 3.58 (77%), DLCO 72%, no BD.  Spirometry 10/24/17 >> FEV1 1.15 (63%), FEV1% 83  FeNO 10/24/17 >> 19  Sleep Tests:   PSG 12/01/06>>AHI 10, SpO2 low 80%, PLMI 0   CPAP titration 02/16/07>>CPAP 11 cm H2O  CPAP 01/25/20 to 02/23/20 >>used on 30 of 30 nights with average 10 hrs 56 min. Average AHI 1.6 with CPAP 11 cm H2O.  Cardiac Tests:   Echo 12/07/19 >> EF 57%, grade 2 DD, mild MR    09/30/2020 Follow up ; asthma exacerbation/pneumonia Patient returns for a 1 week follow-up.  Patient was seen last week with several weeks of progressive shortness of breath.  Was initially seen by cardiology felt to have an asthma exacerbation.  Was given a prednisone taper.  She continued to have ongoing symptoms of shortness of breath and increased albuterol use.  Of note patient did have COVID-19 July 18, 2020.  She was fully vaccinated.  Michela Pitcher it took her about 2 weeks to get over her symptoms.  Last visit chest x-ray showed probable pneumonia with new patchy airspace disease in the right lung base.  Patient also had recurrence of exertional hypoxemia.  She was started on oxygen 2 L with activity.  Lab work showed an elevated D-dimer.  She was set up for a subsequent CT angio chest.  This was negative for PE.  There was mild groundglass opacities bilaterally and an increased patchy airspace disease in the  left upper lobe.  She was started on Augmentin 875 mg twice daily.  She was changed from Flovent to Symbicort.  And given prednisone 20 mg for 5 days. Since last visit patient is feeling much better , with decreased shortness of breath and wheezing.  Feels like she is getting close to her baseline.  Does feel like her energy level has not returned to normal.  She has 1 additional day of Augmentin left.  She feels that Symbicort has really helped.  She would like this sent to her mail in pharmacy. She denies any hemoptysis chest pain orthopnea PND  She says her oxygen levels have been doing much better.  In fact over the last 24 hours she says she has not needed to use oxygen.  O2 saturations have remained remained greater than 90% on room air.    Allergies  Allergen Reactions  . Nebivolol Hcl Other (See Comments)  . Ibuprofen Swelling    Patient reports swelling of legs   . Adhesive [Tape] Other (See Comments)    UNSPECIFIED   . Bactrim [Sulfamethoxazole-Trimethoprim] Other (See Comments)    UNSPECIFIED   . Dairy Aid [Lactase] Other (See Comments)    Ice cream, sour cream  . Lactose Intolerance (Gi) Other (See Comments)  . Microplegia Msa-Msg [Plegisol] Other (See Comments)  . Sulfa Antibiotics Other (See Comments)    UNSPECIFIED   . Meloxicam Nausea Only  Immunization History  Administered Date(s) Administered  . Influenza Split 03/09/2013  . Influenza Whole 04/15/2012  . Influenza, High Dose Seasonal PF 04/29/2016, 03/07/2018, 03/20/2019, 03/24/2020, 05/04/2020  . Influenza,inj,Quad PF,6+ Mos 04/04/2014  . Influenza-Unspecified 05/04/2017  . PFIZER(Purple Top)SARS-COV-2 Vaccination 07/17/2019, 08/06/2019, 11/14/2019  . Pneumococcal Conjugate-13 04/04/2014  . Pneumococcal Polysaccharide-23 03/05/2009  . Td 11/06/2006  . Zoster 02/02/2013    Past Medical History:  Diagnosis Date  . Anemia    PMH  . Anxiety   . Asthma   . Breast cancer ([place]) 1988   left breast cancer   . Collagenous colitis   . Compression fracture    lumbar 1  . Hypertension   . Hypothyroidism   . Osteoarthritis   . Osteopenia   . PONV (postoperative nausea and vomiting)   . Sleep apnea    wears CPAP nightly    Tobacco History: Social History   Tobacco Use  Smoking Status Never Smoker  Smokeless Tobacco Never Used   Counseling given: Not Answered   Outpatient Medications Prior to Visit  Medication Sig Dispense Refill  . albuterol (PROAIR HFA) 108 (90 Base) MCG/ACT inhaler USE 2 INHALATIONS EVERY 6 HOURS AS NEEDED FOR WHEEZING OR SHORTNESS OF BREATH 51 g 3  . amoxicillin-clavulanate (AUGMENTIN) 875-125 MG tablet Take 1 tablet by mouth 2 (two) times daily for 7 days. 14 tablet 0  . Calcium Citrate-Vitamin D (CITRACAL + D PO) Take 2 tablets by mouth daily.    . clobetasol cream (TEMOVATE) 6.83 % Apply 1 application topically daily as needed (irritation).    Marland Kitchen dextromethorphan-guaiFENesin (MUCINEX DM) 30-600 MG per 12 hr tablet Take 1 tablet by mouth every 12 (twelve) hours.    . diclofenac sodium (VOLTAREN) 1 % GEL as needed.    . diltiazem (CARDIZEM CD) 240 MG 24 hr capsule Take 240 mg by mouth daily.    . furosemide (LASIX) 40 MG tablet Take 60 mg by mouth daily. Can take additional dose if weigh increase >2 lbs in 2 days    . glucosamine-chondroitin 500-400 MG tablet Take 1 tablet by mouth in the morning, at noon, in the evening, and at bedtime. Patient taking 4 times daily.    . hydrALAZINE (APRESOLINE) 50 MG tablet TAKE 1 TABLET THREE TIMES A DAY 270 tablet 3  . levothyroxine (SYNTHROID, LEVOTHROID) 88 MCG tablet Take 88 mcg by mouth daily.    Marland Kitchen losartan (COZAAR) 100 MG tablet Take 100 mg by mouth daily.    . Multiple Vitamins-Minerals (MULTIVITAMIN WITH MINERALS) tablet Take 1 tablet by mouth daily.    . naproxen sodium (ALEVE) 220 MG tablet Take 220 mg by mouth daily as needed.    . NON FORMULARY Mega b stress once a day    . Polyethyl Glycol-Propyl Glycol 0.4-0.3 %  SOLN 1 drop as needed.    . potassium chloride SA (KLOR-CON) 20 MEQ tablet Take 20 mEq by mouth daily. 60 mg daily    . sertraline (ZOLOFT) 100 MG tablet Take 100 mg by mouth 2 (two) times daily.     . budesonide-formoterol (SYMBICORT) 160-4.5 MCG/ACT inhaler Inhale 2 puffs into the lungs in the morning and at bedtime. 1 each 5  . predniSONE (DELTASONE) 20 MG tablet Take 1 tablet (20 mg total) by mouth daily with breakfast. 5 tablet 0  . albuterol (PROVENTIL) (2.5 MG/3ML) 0.083% nebulizer solution Take 3 mLs (2.5 mg dose) by nebulization every 6 (six) hours as needed for Wheezing. (Patient not taking: Reported on 09/30/2020)  360 mL 0   No facility-administered medications prior to visit.     Review of Systems:   Constitutional:   No  weight loss, night sweats,  Fevers, chills, + fatigue, or  lassitude.  HEENT:   No headaches,  Difficulty swallowing,  Tooth/dental problems, or  Sore throat,                No sneezing, itching, ear ache, nasal congestion, post nasal drip,   CV:  No chest pain,  Orthopnea, PND, swelling in lower extremities, anasarca, dizziness, palpitations, syncope.   GI  No heartburn, indigestion, abdominal pain, nausea, vomiting, diarrhea, change in bowel habits, loss of appetite, bloody stools.   Resp: No chest wall deformity  Skin: no rash or lesions.  GU: no dysuria, change in color of urine, no urgency or frequency.  No flank pain, no hematuria   MS:  No joint pain or swelling.  No decreased range of motion.  No back pain.    Physical Exam  BP 112/76 (BP Location: Left Arm, Cuff Size: Normal)   Pulse 67   Temp 97.7 F (36.5 C) (Temporal)   Ht 5\' 1"  (1.549 m)   Wt 253 lb 6.4 oz (114.9 kg)   SpO2 96%   BMI 47.88 kg/m   GEN: A/Ox3; pleasant , NAD, BMI 47   HEENT:  [place]/AT,    NOSE-clear, THROAT-clear, no lesions, no postnasal drip or exudate noted.   NECK:  Supple w/ fair ROM; no JVD; normal carotid impulses w/o bruits; no thyromegaly or nodules  palpated; no lymphadenopathy.    RESP  Clear  P & A; w/o, wheezes/ rales/ or rhonchi. no accessory muscle use, no dullness to percussion  CARD:  RRR, no m/r/g, tr  peripheral edema, pulses intact, no cyanosis or clubbing.  GI:   Soft & nt; nml bowel sounds; no organomegaly or masses detected.   Musco: Warm bil, no deformities or joint swelling noted.   Neuro: alert, no focal deficits noted.    Skin: Warm, no lesions or rashes    Lab Results:  CBC   BNP  ProBNP No results found for: PROBNP  Imaging: DG Chest 2 View  Result Date: 09/23/2020 CLINICAL DATA:  Dyspnea. EXAM: CHEST - 2 VIEW COMPARISON:  09/02/2020 FINDINGS: The cardio pericardial silhouette is enlarged. Interstitial markings are diffusely coarsened with chronic features. New patchy airspace disease noted at the right base. Bones are diffusely demineralized with evidence of vertebral augmentation in the region of the thoracolumbar junction. Lower thoracic compression fracture is similar to prior. IMPRESSION: New patchy airspace disease at the right lung base, suspicious for pneumonia. Cardiomegaly with diffuse interstitial lung disease. Electronically Signed   By: Misty Stanley M.D.   On: 09/23/2020 12:05   DG Chest 2 View  Result Date: 09/02/2020 CLINICAL DATA:  Dyspnea on exertion EXAM: CHEST - 2 VIEW COMPARISON:  11/26/2019, 09/15/2015 FINDINGS: Mild cardiomegaly without overt pulmonary edema. Patchy atelectasis at the bases. Trace right pleural effusion or thickening. No pneumothorax. Old right-sided rib deformities. Treated compression deformity at the thoracolumbar junction. IMPRESSION: Mild cardiomegaly without overt edema. Patchy atelectasis at the bases with trace right pleural effusion or thickening. Electronically Signed   By: Donavan Foil M.D.   On: 09/02/2020 16:40   CT ANGIO CHEST PE W OR WO CONTRAST  Result Date: 09/23/2020 CLINICAL DATA:  Shortness of breath and hypoxia. Elevated D-dimer. Clinical  suspicion for pulmonary embolism. EXAM: CT ANGIOGRAPHY CHEST WITH CONTRAST TECHNIQUE: Multidetector CT imaging  of the chest was performed using the standard protocol during bolus administration of intravenous contrast. Multiplanar CT image reconstructions and MIPs were obtained to evaluate the vascular anatomy. CONTRAST:  33mL OMNIPAQUE IOHEXOL 350 MG/ML SOLN COMPARISON:  None. FINDINGS: Cardiovascular: Image degradation by respiratory motion artifact noted mainly in the lung bases4. Satisfactory opacification of pulmonary arteries noted, and no pulmonary emboli identified. No evidence of thoracic aortic dissection or aneurysm. Aortic and coronary atherosclerotic calcification noted. Mediastinum/Nodes: No masses or pathologically enlarged lymph nodes identified. Lungs/Pleura: Mild heterogeneous ground-glass opacity is seen bilaterally with minimal patchy airspace disease in the anterior left upper lobe. This could be due to atypical infection or edema. No suspicious pulmonary nodules or masses identified. No evidence of pleural effusion. Upper abdomen: No acute findings. Musculoskeletal: No suspicious bone lesions identified. Left breast implant noted. Review of the MIP images confirms the above findings. IMPRESSION: No evidence of pulmonary embolism. Mild heterogeneous ground-glass opacity and minimal patchy airspace disease in left upper lobe. This is nonspecific and may be seen with atypical infection or edema. No evidence of mass, lymphadenopathy or pleural effusion. Aortic Atherosclerosis (ICD10-I70.0). Electronically Signed   By: Marlaine Hind M.D.   On: 09/23/2020 18:47      No flowsheet data found.  No results found for: NITRICOXIDE      Assessment & Plan:   CAP (community acquired pneumonia) Community-acquired pneumonia-patient has clinical improvement after antibiotics. Clinically she is improving.  We will have her finish up antibiotics.  Continue on aggressive mucociliary clearance  regimen .  We will recheck chest x-ray on return in 3 to 4 weeks.  Plan  Patient Instructions  Finish Augmentin today .  Continue on Oxgyen 2l/m with activity as needed. , Goal is to have Oxygen sats >90% .  Continue on Symbicort 160 2 puffs Twice daily   Albuterol As needed   Continue on CPAP At bedtime  .   Follow up with in 3-4 weeks with chest xray Dr. Halford Chessman  Or Ary Lavine NP  Please contact office for sooner follow up if symptoms do not improve or worsen or seek emergency care          Chronic respiratory failure with hypoxia ([place]) Decreased oxygen demands.  Patient is to maintain O2 saturations greater than 88 to 90%.  If no further desaturations on return visit can discontinue oxygen at follow-up visit.  OSA (obstructive sleep apnea) Continue on CPAP at bedtime.  Doing well.  Asthma with acute exacerbation Recent flareup with associated pneumonia.  Now improving.  Continue continue on Symbicort.     Rexene Edison, NP 09/30/2020

## 2020-10-03 ENCOUNTER — Telehealth: Payer: Self-pay | Admitting: *Deleted

## 2020-10-03 NOTE — Telephone Encounter (Signed)
Called and spoke with patient regarding Symbicort (budesonide and formoterol), she states she received the 1st one at [facility] for $47 for 1 month.  She can get a [facility] supply [facility] for [facility]65.  She had already received a PA for [employer] and apparently her PA did not [facility].  I let her know I have the form from [facility] and will take care of it for her.  She was appreciative.  Attempted to do a PA using covermymeds, received the following message:  Additional Information Required [facility] is not able to process this request through [place], please contact the plan at [phone] or fax in request to [phone]. *NOTICE ABOUT OPIOIDS*: Effective Jul 05, 2017 Medicare Part D plans have implemented new opioid safety edits, including a 7 day supply limit for opioid naiive patients. If your patient is opioid naiive, decrease the quantity and days supply of the script to less than 7 days. If a day supply > 7 days is needed, please contact the plan at [phone] or fax in request to [phone]. For claims rejecting for 31-UCJ Duplicate [place] Opioids and/or 88-DUR Opioid/Benzo Drug interaction no coverage determination is required. Please contact the filling pharmacy directly.  Called [phone], placed on hold, unable to get a live person on the phone.  Will attempt at a later time.

## 2020-10-07 NOTE — Telephone Encounter (Signed)
Heather, do you have the [facility] form still? If so, what is the phone number listed on the form? I can call for the PA to initiate over the phone.

## 2020-10-07 NOTE — Telephone Encounter (Signed)
Heather, please advise on this if you still have the form as pt is calling back.

## 2020-10-09 NOTE — Telephone Encounter (Signed)
Medical necessity form received from [facility].  Form filled out and put in Tammy Parrett's box for her to complete and sign.

## 2020-10-09 NOTE — Telephone Encounter (Signed)
Medication name and strength: Budesonide-formoterol Ordering Provider: Rexene Edison NP  Was PA started with CMM?: yes If yes, please enter KEY: [id number] Medication tried and failed: Flovent, Dulera Covered Alternatives: Advair diskus, Advair HFA, Breo and Symbicort  PA sent to plan, time frame for approval / denial: 1-3 days Routing to [facility] for follow-up  [facility]:  [phone], spoke with [facility] regarding coverage for Symbicort versus Budesonide-formoterol.  She checked the patient's coverage and verified that neither are covered and require a PA.  Advised Gladis that she has used [employer] in the past and she gets painful thrush and that she had approval when she was using [employer].  She stated she did not see a previous PA.  She said we could also fill out a medical necessity form, but could not tell me where to locate the form.  She transferred me to the PA line and I spoke with Ivin Booty verified that both the budesonide-formoterol and Symbicort require a PA.  I asked about the medical necessity form and where to find it, she is faxing me a tier reduction form to complete.  Sent form electronically to plan for tier reduction. Called and spoke with patient informed of steps taken to attempt to get medication approved/cost reduced.  She was very Patent attorney.  I let her know that it could take up to 5 business day to get a response and that once we get a response we will contact her and let her know.  Will await decision.

## 2020-10-09 NOTE — Telephone Encounter (Signed)
There was no form faxed to the office, the patient was called and she stated that she needed a PA and had one done previously for [employer].  I Called [facility] at [phone] to start PA:  Spoke with [place] regarding starting a PA for Symbicort 160-4.5 (budesonide-formoterol), I was on hold for 10 minutes waiting on a respresentative to start the PA, advised that I could use covermymeds ([facility]).

## 2020-10-10 ENCOUNTER — Telehealth: Payer: Self-pay | Admitting: *Deleted

## 2020-10-10 NOTE — Telephone Encounter (Signed)
Medical necessity form completed for budesonide/formoterol (symbicort), signed by Rexene Edison NP and [facility]ed to [facility] [phone].  Received fax report that it was sent successfully.  Nothing further needed.

## 2020-10-13 NOTE — Telephone Encounter (Signed)
Late entry:  Medial necessity form completed and faxed on 10/10/2020, received fax report that fax was sent successfully.  Attempted to call [facility] to get update, but there was an issue with their automated system.  Called patient and updated patient that form had been faxed on Friday and would call on 10/14/2020 to attempt to get an update again.

## 2020-10-13 NOTE — Telephone Encounter (Signed)
Pt calling to check on the status of PA for Symbicort inhaler. Pt can be reached at [phone].

## 2020-10-14 NOTE — Telephone Encounter (Signed)
Received fax stating that the wrong PA form was done  They faxed a new one to be filled out from [place]  I have placed in TP's lookat in B pod

## 2020-10-16 ENCOUNTER — Telehealth: Payer: Self-pay

## 2020-10-16 ENCOUNTER — Other Ambulatory Visit: Payer: Self-pay

## 2020-10-16 MED ORDER — ADVAIR HFA 115-21 MCG/ACT IN AERO: 2.0000 | INHALATION_SPRAY | Freq: Two times a day (BID) | RESPIRATORY_TRACT | 1 refills | Status: DC

## 2020-10-16 NOTE — Telephone Encounter (Signed)
Patient made aware that symbicort not covered by insurance. [facility]. Discussed how to administer and to call if any questions.

## 2020-10-16 NOTE — Telephone Encounter (Signed)
Insurance will not cover Symbicort.  Patient will need to begin Advair 115/21 2 puffs twice daily. Continue with follow-up as planned and as needed

## 2020-10-16 NOTE — Telephone Encounter (Signed)
Patient is aware of below message and voiced her understanding.  She is agreeable to Advair.  Rx has been sent to preferred pharmacy.  Nothing further needed at this time.

## 2020-10-21 ENCOUNTER — Other Ambulatory Visit: Payer: Self-pay

## 2020-10-21 ENCOUNTER — Ambulatory Visit (INDEPENDENT_AMBULATORY_CARE_PROVIDER_SITE_OTHER): Payer: Medicare HMO | Admitting: Pulmonary Disease

## 2020-10-21 ENCOUNTER — Encounter: Payer: Self-pay | Admitting: Pulmonary Disease

## 2020-10-21 VITALS — BP 140/78 | HR 71 | Temp 98.2°F | Ht 61.5 in | Wt 251.8 lb

## 2020-10-21 DIAGNOSIS — J849 Interstitial pulmonary disease, unspecified: Secondary | ICD-10-CM

## 2020-10-21 DIAGNOSIS — J9611 Chronic respiratory failure with hypoxia: Secondary | ICD-10-CM | POA: Diagnosis not present

## 2020-10-21 NOTE — Progress Notes (Signed)
[facility]  Chief Complaint  Patient presents with  . Follow-up    SHOB remaining the same, here for follow up CXR for PNA    Constitutional:  BP 140/78 (BP Location: Right Arm, Patient Position: Sitting, Cuff Size: Normal)   Pulse 71   Temp 98.2 F (36.8 C) (Skin)   Ht 5' 1.5" (1.562 m)   Wt 251 lb 12.8 oz (114.2 kg)   SpO2 92%   BMI 46.81 kg/m   Past Medical History:  Anemia, Anxiety, Breast cancer 1988, Collagenous colitis, L1 compression fracture, HTN, Hypothyroidism, OA, Osteopenia, Diastolic CHF, CKD 3a, COVID 23 July 2020  Past Surgical History:  Her  has a past surgical history that includes Modified radical mastectomy w/ axillary lymph node dissection; Breast reduction surgery; Toe Surgery; Septoplasty; Cesarean section; Tonsillectomy and adenoidectomy; right foot surgery; Reconstruction breast w/ latissimus dorsi flap; Colonoscopy w/ biopsies and polypectomy; Cataract extraction w/ intraocular lens  implant, bilateral; Kyphoplasty (N/A, 02/18/2016); Cyst excision (N/A, 08/30/2018); and Reduction mammaplasty (Right).  Brief Summary:  [name] is a 80 y.o. female with obstructive sleep apnea and asthma.    Subjective:   She developed a cough and fatigue in mid January.  She did home COVID test, and was positive for COVID 19 infection.  She has persistent dyspnea and was treated with prednisone in February and again in March.  She saw Tammy Parrett at end of March and found to have low oxygen level.  CT chest showed b/l changes of pneumonia and possible ILD COVID infection.  She was treated with another course of steroids, and with augmentin.  She was also started on symbicort.  She feels better, and no longer using oxygen.  She has pulse oximeter at home and her readings never go below 89%, even with exertion.  She feels that symbicort has helped tremendously with her breathing, but unfortunately it isn't covered by her  insurance.  She will switch to advair after she finishes current script for symbicort.  She is using CPAP nightly, and no issues with set up.  Physical Exam:   Appearance - well kempt   ENMT - no sinus tenderness, no oral exudate, no LAN, Mallampati 3 airway, no stridor  Respiratory - equal breath sounds bilaterally, no wheezing or rales  CV - s1s2 regular rate and rhythm, no murmurs  Ext - no clubbing, no edema  Skin - no rashes  Psych - normal mood and affect  Pulmonary Tests:   Spirometry 06/06/12>>FEV1 1.48 (69%), FVC 1.95 (70%), FEV1% 76  PFT 07/27/12 >> FEV1 1.71 (88%), FEV1% 77, FEF 25-75% 1.44 (64%), TLC 3.58 (77%), DLCO 72%, no BD.  Spirometry 10/24/17 >> FEV1 1.15 (63%), FEV1% 83  FeNO 10/24/17 >> 19  Chest imaging:   CT angio chest 09/23/20 >> GGO b/l with patchy ASD LUL  Sleep Tests:   PSG 12/01/06>>AHI 10, SpO2 low 80%, PLMI 0   CPAP titration 02/16/07>>CPAP 11 cm H2O  CPAP 09/21/20 to 10/20/20 >> used on 30 of 30 nights with average 11 hrs 16 min.  Average AHI 1.4 with CPAP 11 cm H2O  Cardiac Tests:   Echo 12/07/19 >> EF 57%, grade 2 DD, mild MR  Social History:  She  reports that she has never smoked. She has never used smokeless tobacco. She reports current alcohol use of about 1.0 standard drink of alcohol per week. She reports that she does not use drugs.  Family History:  Her family  history includes Brain cancer in her maternal aunt; Breast cancer in her maternal aunt and another family member; Hypertension in her brother, father, and mother; Kidney cancer in her paternal uncle and another family member; Lung cancer in her mother; Ovarian cancer in her sister and another family member; Prostate cancer in her paternal uncle.     Assessment/Plan:   History COVID 19 pneumonia pneumonia with presumed bacterial superinfection. - from January through March of 2022 - clinically improved after course of augmentin, and several courses of prednisone - her  recent CT chest showed changes that are suggestive of possible early ILD from COVID infection; will arrange for high resolution CT chest in August 2022 to monitor - her oxygen needs have improved and she is no longer using supplemental oxygen at home; will have home oxygen set up discontinued  Obstructive sleep apnea. - she is compliant with CPAP and reports benefit - she uses [employer] for her DME - continue CPAP 11 cm H2O  Moderate persistent asthma. - improved with change to symbicort, but not covered by her insurance - she will transition to advair once her current course of symbicort is completed - prn albuterol  Dyspnea on exertion. - she is not able to walk more than 200 feet without have to stop and rest - handicap parking form completed for her  Obesity. - she is aware of how her weight can impact her health, particularly with regard to sleep apnea  Chronic diastolic CHF. - followed by Dr. Einar Gip   Time Spent Involved in Patient Care on Day of Examination:  34 minutes  Follow up:   Patient Instructions  Will arrange for CT chest in August 2022 and follow up after that    Medication List:   Allergies as of 10/21/2020      Reactions   Nebivolol Hcl Other (See Comments)   Ibuprofen Swelling   Patient reports swelling of legs    Adhesive [tape] Other (See Comments)   UNSPECIFIED    Bactrim [sulfamethoxazole-trimethoprim] Other (See Comments)   UNSPECIFIED   Dairy Aid [lactase] Other (See Comments)   Ice cream, sour cream   Lactose Intolerance (gi) Other (See Comments)   Microplegia Msa-msg [plegisol] Other (See Comments)   Sulfa Antibiotics Other (See Comments)   UNSPECIFIED   Meloxicam Nausea Only      Medication List       Accurate as of October 21, 2020  1:01 PM. If you have any questions, ask your nurse or doctor.        Advair HFA 115-21 MCG/ACT inhaler Generic drug: fluticasone-salmeterol Inhale 2 puffs into the lungs 2 (two) times daily.    albuterol (2.5 MG/3ML) 0.083% nebulizer solution Commonly known as: PROVENTIL Take 3 mLs (2.5 mg dose) by nebulization every 6 (six) hours as needed for Wheezing.   albuterol 108 (90 Base) MCG/ACT inhaler Commonly known as: ProAir HFA USE 2 INHALATIONS EVERY 6 HOURS AS NEEDED FOR WHEEZING OR SHORTNESS OF BREATH   budesonide-formoterol 160-4.5 MCG/ACT inhaler Commonly known as: Symbicort Inhale 2 puffs into the lungs in the morning and at bedtime.   CITRACAL + D PO Take 2 tablets by mouth daily.   clobetasol cream 0.05 % Commonly known as: TEMOVATE Apply 1 application topically daily as needed (irritation).   dextromethorphan-guaiFENesin 30-600 MG 12hr tablet Commonly known as: MUCINEX DM Take 1 tablet by mouth every 12 (twelve) hours.   diclofenac sodium 1 % Gel Commonly known as: VOLTAREN as needed.   diltiazem 240  MG 24 hr capsule Commonly known as: CARDIZEM CD Take 240 mg by mouth daily.   furosemide 40 MG tablet Commonly known as: LASIX Take 60 mg by mouth daily. Can take additional dose if weigh increase >2 lbs in 2 days   glucosamine-chondroitin 500-400 MG tablet Take 1 tablet by mouth 3 (three) times daily. Patient taking 4 times daily.   hydrALAZINE 50 MG tablet Commonly known as: APRESOLINE TAKE 1 TABLET THREE TIMES A DAY   levothyroxine 88 MCG tablet Commonly known as: SYNTHROID Take 88 mcg by mouth daily.   losartan 100 MG tablet Commonly known as: COZAAR Take 100 mg by mouth daily.   multivitamin with minerals tablet Take 1 tablet by mouth daily.   naproxen sodium 220 MG tablet Commonly known as: ALEVE Take 220 mg by mouth daily as needed.   NON FORMULARY Mega b stress once a day   Polyethyl Glycol-Propyl Glycol 0.4-0.3 % Soln 1 drop as needed.   potassium chloride SA 20 MEQ tablet Commonly known as: KLOR-CON Take 20 mEq by mouth daily. 60 mg daily   sertraline 100 MG tablet Commonly known as: ZOLOFT Take 100 mg by mouth 2 (two) times  daily.       Signature:  Chesley Mires, MD [place] Pager - [phone] 10/21/2020, 1:01 PM

## 2020-10-21 NOTE — Patient Instructions (Signed)
Will arrange for CT chest in August 2022 and follow up after that

## 2020-11-13 ENCOUNTER — Other Ambulatory Visit: Payer: Self-pay | Admitting: Physician Assistant

## 2020-11-13 DIAGNOSIS — Z1231 Encounter for screening mammogram for malignant neoplasm of breast: Secondary | ICD-10-CM

## 2020-11-27 ENCOUNTER — Telehealth: Payer: Self-pay | Admitting: Pulmonary Disease

## 2020-11-27 NOTE — Telephone Encounter (Signed)
Patient is aware of below message and voiced her understanding.  Nothing further needed.   

## 2020-11-27 NOTE — Telephone Encounter (Signed)
Spoke with pt, wants to know when she should get her second covid booster shot. Received first vaccine 07/2019, second vaccine 08/2019, had a booster 11/2019 per pt. Pfizer X3.  Pt states she had covid pneumonia in March 2022, states she is back at baseline now.   VS please advise on recs, thanks!

## 2020-11-27 NOTE — Telephone Encounter (Signed)
Should be okay for her to proceed with getting 2nd booster COVID vaccine.

## 2020-12-23 ENCOUNTER — Encounter: Payer: Self-pay | Admitting: Student

## 2020-12-23 ENCOUNTER — Other Ambulatory Visit: Payer: Self-pay

## 2020-12-23 ENCOUNTER — Ambulatory Visit: Payer: Medicare HMO | Admitting: Student

## 2020-12-23 VITALS — BP 134/56 | HR 64 | Temp 98.3°F | Resp 17 | Ht 61.5 in | Wt 254.0 lb

## 2020-12-23 DIAGNOSIS — I5032 Chronic diastolic (congestive) heart failure: Secondary | ICD-10-CM

## 2020-12-23 DIAGNOSIS — R600 Localized edema: Secondary | ICD-10-CM

## 2020-12-23 DIAGNOSIS — I10 Essential (primary) hypertension: Secondary | ICD-10-CM

## 2020-12-23 MED ORDER — FUROSEMIDE 80 MG PO TABS: 80.0000 mg | ORAL_TABLET | Freq: Every day | ORAL | 3 refills | Status: DC

## 2020-12-23 MED ORDER — POTASSIUM CHLORIDE CRYS ER 20 MEQ PO TBCR
60.0000 meq | EXTENDED_RELEASE_TABLET | Freq: Every day | ORAL | 3 refills | Status: DC
Start: 2020-12-23 — End: 2022-03-23

## 2020-12-23 NOTE — Progress Notes (Signed)
Primary Physician/Referring:  [name]  Patient ID: [name], female    DOB: 04/30/1941, 80 y.o.   MRN: [id number]  Chief Complaint  Patient presents with   Hypertension   chronic diastolic heart failure    3 months   HPI:    AYOMIDE ZULETA  is a 80 y.o. Caucasian female  with hypothyroidism, hypertension, depression/anxiety, OSA on CPAP, history of left breast cancer, hypocalcemia, CKD stage 3. She did not tolerate Bisoprolol,  labetalol due to worsening asthma and Aldactone caused worsening renal function.   Patient presents for 53-monthfollow-up of hypertension, chronic diastolic heart failure.  She is presently following closely for management of dyspnea per pulmonology.  Patient reports symptoms of dyspnea have significantly improved since last visit.  She is without specific complaints today.  She does report she has been taking 80 mg of Lasix every 2 days due to increased bilateral lower extremity swelling.  Patient admits to some dietary indiscretion with occasional high salt intake.   She denies chest pain, palpitations, dizziness, syncope, near syncope, leg swelling, orthopnea.   Past Medical History:  Diagnosis Date   Anemia    PMH   Anxiety    Asthma    [place]   left breast cancer   Collagenous colitis    Compression fracture    lumbar 1   Hypertension    Hypothyroidism    Osteoarthritis    Osteopenia    PONV (postoperative nausea and vomiting)    Sleep apnea    wears CPAP nightly   Past Surgical History:  Procedure Laterality Date   BREAST REDUCTION SURGERY     Right   CATARACT EXTRACTION W/ INTRAOCULAR LENS  IMPLANT, BILATERAL     CESAREAN SECTION     COLONOSCOPY W/ BIOPSIES AND POLYPECTOMY     CYST EXCISION N/A 08/30/2018   Procedure: excision of posterior shoulder / back sebaceous cyst;  Surgeon: DWallace Going DO;  [facility]  Service: Plastics;  Laterality: N/A;   KYPHOPLASTY N/A  02/18/2016   Procedure: LUMBAR 1 KYPHOPLASTY;  Surgeon: MPhylliss Bob MD;  Location[place] [facility]  Service: Orthopedics;  Laterality: N/A;  LUMBAR 1 KYPHOPLASTY   MODIFIED RADICAL MASTECTOMY W/ AXILLARY LYMPH NODE DISSECTION     Left   RECONSTRUCTION BREAST W/ LATISSIMUS DORSI FLAP     left   REDUCTION MAMMAPLASTY Right    right foot surgery     corrected hammer toe, straightened big toe   SEPTOPLASTY     TOE SURGERY     TONSILLECTOMY AND ADENOIDECTOMY     Family History  Problem Relation Age of Onset   Lung cancer Mother    Hypertension Mother    Ovarian cancer Sister    Breast cancer Maternal Aunt    Brain cancer Maternal Aunt    Breast cancer Other        cousin   Ovarian cancer Other        cousin   Kidney cancer Other        cousin   Kidney cancer Paternal Uncle    Prostate cancer Paternal Uncle    Hypertension Father    Hypertension Brother     Social History   Tobacco Use   Smoking status: Never   Smokeless tobacco: Never  Substance Use Topics   Alcohol use: Yes    Alcohol/week: 1.0 standard drink    Types: 1 Glasses of wine per week  Comment: OCC   Marital Status: Married  ROS  Review of Systems  Constitutional: Negative for malaise/fatigue and weight gain.  Cardiovascular:  Positive for dyspnea on exertion (improving) and leg swelling. Negative for chest pain, claudication, near-syncope, orthopnea, palpitations, paroxysmal nocturnal dyspnea and syncope.  Respiratory:  Negative for wheezing.   Hematologic/Lymphatic: Does not bruise/bleed easily.  Gastrointestinal:  Negative for melena.  Neurological:  Negative for dizziness and weakness.  Objective  Blood pressure (!) 134/56, pulse 64, temperature 98.3 F (36.8 C), temperature source Temporal, resp. rate 17, height 5' 1.5" (1.562 m), weight 254 lb (115.2 kg), SpO2 97 %.  Vitals with BMI 12/23/2020 12/23/2020 10/21/2020  Height - 5' 1.5" 5' 1.5"  Weight - 254 lbs 251 lbs 13 oz  BMI - 00.92 33.00  Systolic 762  263 335  Diastolic 56 78 78  Pulse 64 76 71     Physical Exam Vitals reviewed.  Constitutional:      General: She is not in acute distress.    Appearance: She is well-developed. She is obese. She is not ill-appearing.  HENT:     Head: Normocephalic and atraumatic.  Cardiovascular:     Rate and Rhythm: Normal rate and regular rhythm.     Pulses: Normal pulses and intact distal pulses.          Carotid pulses are 2+ on the right side and 2+ on the left side.      Dorsalis pedis pulses are 2+ on the right side and 2+ on the left side.       Posterior tibial pulses are 2+ on the right side and 2+ on the left side.     Heart sounds: S1 normal and S2 normal. No murmur heard.   No gallop.     Comments: No JVD. Pulmonary:     Effort: Pulmonary effort is normal. No accessory muscle usage or respiratory distress.     Breath sounds: No decreased breath sounds, wheezing, rhonchi or rales.  Musculoskeletal:     Right lower leg: Edema (1+ pitting, stable) present.     Left lower leg: Edema (1+ pitting stable) present.  Skin:    General: Skin is warm and dry.  Neurological:     General: No focal deficit present.     Mental Status: She is alert.  Psychiatric:        Mood and Affect: Mood normal.        Behavior: Behavior normal.   Laboratory examination:   Recent Labs    09/02/20 1528  NA 145*  K 3.4*  CL 104  CO2 22  GLUCOSE 100*  BUN 17  CREATININE 0.88  CALCIUM 9.8   CrCl cannot be calculated (Patient's most recent lab result is older than the maximum 21 days allowed.).  CMP Latest Ref Rng & Units 09/02/2020 11/30/2019 12/08/2018  Glucose 65 - 99 mg/dL 100(H) 107(H) 113(H)  BUN 8 - 27 mg/dL _0 Creatinine 0.57 - 1.00 mg/dL 0.88 1.02(H) 1.01(H)  Sodium 134 - 144 mmol/L 145(H) 145(H) 142  Potassium 3.5 - 5.2 mmol/L 3.4(L) 3.8 3.7  Chloride 96 - 106 mmol/L 104 104 103  CO2 20 - 29 mmol/L _1 Calcium 8.7 - 10.3 mg/dL 9.8 10.2 9.7  Total Protein 6.5 - 8.1 g/dL - - -   Total Bilirubin 0.3 - 1.2 mg/dL - - -  Alkaline Phos 38 - 126 U/L - - -  AST 15 - 41 U/L - - -  ALT 14 - 54 U/L - - -   CBC Latest Ref Rng & Units 09/02/2020 02/18/2016 12/17/2014  WBC 3.4 - 10.8 x10E3/uL 7.8 5.9 5.7  Hemoglobin 11.1 - 15.9 g/dL 12.2 13.8 14.0  Hematocrit 34.0 - 46.6 % 36.0 42.5 41.6  Platelets 150 - 450 x10E3/uL 185 168 185   Lipid Panel  No results found for: CHOL, TRIG, HDL, CHOLHDL, VLDL, LDLCALC, LDLDIRECT HEMOGLOBIN A1C No results found for: HGBA1C, MPG TSH No results for input(s): TSH in the last 8760 hours.  BNP (last 3 results) Recent Labs    09/02/20 1528  BNP 53.1    External labs: Labs 04/01/2020:  A1c 5.1%.  TSH normal.  Hb 12.6/HCT 38.4, mild macrocytosis present, platelets 172.  Serum glucose 94 mg, BUN 17, creatinine 0.86, EGFR 66 mL, sodium 142, potassium 3.6, CMP otherwise normal.  Total cholesterol 217, triglycerides 68, HDL 118, LDL 87. 03/21/2019: BUN 22 creatinine 0.97, EGFR 57 mL, sodium 144, potassium 3.9. Total cholesterol 185, triglycerides 126, HDL 87, LDL 77. Allergies   Allergies  Allergen Reactions   Nebivolol Hcl Other (See Comments)   Ibuprofen Swelling    Patient reports swelling of legs    Adhesive [Tape] Other (See Comments)    UNSPECIFIED    Bactrim [Sulfamethoxazole-Trimethoprim] Other (See Comments)    UNSPECIFIED    Dairy Aid [Lactase] Other (See Comments)    Ice cream, sour cream   Lactose Intolerance (Gi) Other (See Comments)   Microplegia Msa-Msg [Plegisol] Other (See Comments)   Sulfa Antibiotics Other (See Comments)    UNSPECIFIED    Meloxicam Nausea Only      Medications Prior to Visit:   Outpatient Medications Prior to Visit  Medication Sig Dispense Refill   albuterol (PROAIR HFA) 108 (90 Base) MCG/ACT inhaler USE 2 INHALATIONS EVERY 6 HOURS AS NEEDED FOR WHEEZING OR SHORTNESS OF BREATH 51 g 3   albuterol (PROVENTIL) (2.5 MG/3ML) 0.083% nebulizer solution Take 3 mLs (2.5 mg dose) by  nebulization every 6 (six) hours as needed for Wheezing. 360 mL 0   Calcium Citrate-Vitamin D (CITRACAL + D PO) Take 2 tablets by mouth daily.     clobetasol cream (TEMOVATE) 5.18 % Apply 1 application topically daily as needed (irritation).     denosumab (PROLIA) 60 MG/ML SOSY injection every 6 (six) months.     dextromethorphan-guaiFENesin (MUCINEX DM) 30-600 MG per 12 hr tablet Take 1 tablet by mouth every 12 (twelve) hours.     diclofenac sodium (VOLTAREN) 1 % GEL as needed.     diltiazem (CARDIZEM CD) 240 MG 24 hr capsule Take 240 mg by mouth daily.     fluticasone-salmeterol (ADVAIR HFA) 115-21 MCG/ACT inhaler Inhale 2 puffs into the lungs 2 (two) times daily. 3 each 1   glucosamine-chondroitin 500-400 MG tablet Take 1 tablet by mouth 3 (three) times daily. Patient taking 4 times daily.     hydrALAZINE (APRESOLINE) 50 MG tablet TAKE 1 TABLET THREE TIMES A DAY 270 tablet 3   levothyroxine (SYNTHROID, LEVOTHROID) 88 MCG tablet Take 88 mcg by mouth daily.     losartan (COZAAR) 100 MG tablet Take 100 mg by mouth daily.     Multiple Vitamins-Minerals (MULTIVITAMIN WITH MINERALS) tablet Take 1 tablet by mouth daily.     naproxen sodium (ALEVE) 220 MG tablet Take 220 mg by mouth daily as needed.     NON FORMULARY Mega b stress once a day     ORTHOVISC 30 MG/2ML SOSY Inject 6 mLs into  the skin every 6 (six) months.     Polyethyl Glycol-Propyl Glycol 0.4-0.3 % SOLN 1 drop as needed.     sertraline (ZOLOFT) 100 MG tablet Take 100 mg by mouth 2 (two) times daily.      furosemide (LASIX) 40 MG tablet Take 60 mg by mouth daily. Can take additional dose if weigh increase >2 lbs in 2 days     potassium chloride SA (KLOR-CON) 20 MEQ tablet Take 20 mEq by mouth daily. 60 mg daily     budesonide-formoterol (SYMBICORT) 160-4.5 MCG/ACT inhaler Inhale 2 puffs into the lungs in the morning and at bedtime. 3 each 3   No facility-administered medications prior to visit.     Final Medications at End of Visit     Current Meds  Medication Sig   albuterol (PROAIR HFA) 108 (90 Base) MCG/ACT inhaler USE 2 INHALATIONS EVERY 6 HOURS AS NEEDED FOR WHEEZING OR SHORTNESS OF BREATH   albuterol (PROVENTIL) (2.5 MG/3ML) 0.083% nebulizer solution Take 3 mLs (2.5 mg dose) by nebulization every 6 (six) hours as needed for Wheezing.   Calcium Citrate-Vitamin D (CITRACAL + D PO) Take 2 tablets by mouth daily.   clobetasol cream (TEMOVATE) 8.00 % Apply 1 application topically daily as needed (irritation).   denosumab (PROLIA) 60 MG/ML SOSY injection every 6 (six) months.   dextromethorphan-guaiFENesin (MUCINEX DM) 30-600 MG per 12 hr tablet Take 1 tablet by mouth every 12 (twelve) hours.   diclofenac sodium (VOLTAREN) 1 % GEL as needed.   diltiazem (CARDIZEM CD) 240 MG 24 hr capsule Take 240 mg by mouth daily.   fluticasone-salmeterol (ADVAIR HFA) 115-21 MCG/ACT inhaler Inhale 2 puffs into the lungs 2 (two) times daily.   glucosamine-chondroitin 500-400 MG tablet Take 1 tablet by mouth 3 (three) times daily. Patient taking 4 times daily.   hydrALAZINE (APRESOLINE) 50 MG tablet TAKE 1 TABLET THREE TIMES A DAY   levothyroxine (SYNTHROID, LEVOTHROID) 88 MCG tablet Take 88 mcg by mouth daily.   losartan (COZAAR) 100 MG tablet Take 100 mg by mouth daily.   Multiple Vitamins-Minerals (MULTIVITAMIN WITH MINERALS) tablet Take 1 tablet by mouth daily.   naproxen sodium (ALEVE) 220 MG tablet Take 220 mg by mouth daily as needed.   NON FORMULARY Mega b stress once a day   ORTHOVISC 30 MG/2ML SOSY Inject 6 mLs into the skin every 6 (six) months.   Polyethyl Glycol-Propyl Glycol 0.4-0.3 % SOLN 1 drop as needed.   potassium chloride SA (KLOR-CON M20) 20 MEQ tablet Take 3 tablets (60 mEq total) by mouth daily.   sertraline (ZOLOFT) 100 MG tablet Take 100 mg by mouth 2 (two) times daily.    [DISCONTINUED] furosemide (LASIX) 40 MG tablet Take 60 mg by mouth daily. Can take additional dose if weigh increase >2 lbs in 2 days    [DISCONTINUED] potassium chloride SA (KLOR-CON) 20 MEQ tablet Take 20 mEq by mouth daily. 60 mg daily      Medications Discontinued During This Encounter  Medication Reason   budesonide-formoterol (SYMBICORT) 160-4.5 MCG/ACT inhaler Error   potassium chloride SA (KLOR-CON) 20 MEQ tablet Dose change   furosemide (LASIX) 40 MG tablet Reorder    Radiology:   No results found.  Chest x-ray 09/02/2020: Mild cardiomegaly without overt pulmonary edema. Patchy atelectasis at the bases. Trace right pleural effusion or thickening. No pneumothorax. Old right-sided rib deformities. Treated compression deformity at the thoracolumbar junction.   IMPRESSION: Mild cardiomegaly without overt edema. Patchy atelectasis at the bases with  trace right pleural effusion or thickening.  Cardiac Studies:     Lexiscan Myoview stress test 01/17/2019: Lexiscan stress test was performed. Stress EKG is non-diagnostic, as this is pharmacological stress test. Pharmacological myocardial perfusion imaging is normal. LVEF 69%. Low risk study.   Echocardiogram 12/07/2019:  Left ventricle cavity is normal in size. Moderate concentric hypertrophy  of the left ventricle. Normal global wall motion. Normal LV systolic function with EF 57%. Doppler evidence of grade II (pseudonormal)  diastolic dysfunction, elevated LAP.  Left atrial cavity is mildly dilated.  Mild (Grade I) mitral regurgitation.  Mild tricuspid regurgitation. RA-RV gradient 20 mmHg.  No significant change compared to previous study on 12/26/2018.    EKG:   07/14/2020: Sinus rhythm at the rate of 63 bpm with borderline first-degree AV block, no evidence of ischemia, normal EKG otherwise.    10/17/2019: Marked sinus bradycardia at rate of 54 bpm with first-degree AV block, normal axis, no evidence of ischemia.  Abnormal EKG. COMPARED TO EKG 11/24/2018: Sinus rhythm/sinus arrhythmia at 64 bpm.  Assessment     ICD-10-CM   1. Chronic diastolic heart  failure (HCC)  L39.03 Basic metabolic panel    PCV ECHOCARDIOGRAM COMPLETE    2. Essential hypertension  E09 Basic metabolic panel    PCV ECHOCARDIOGRAM COMPLETE    3. Leg edema  R60.0       Meds ordered this encounter  Medications   furosemide (LASIX) 80 MG tablet    Sig: Take 1 tablet (80 mg total) by mouth daily. Can take additional dose if weigh increase >2 lbs in 2 days    Dispense:  90 tablet    Refill:  3   potassium chloride SA (KLOR-CON M20) 20 MEQ tablet    Sig: Take 3 tablets (60 mEq total) by mouth daily.    Dispense:  270 tablet    Refill:  3   Medications Discontinued During This Encounter  Medication Reason   budesonide-formoterol (SYMBICORT) 160-4.5 MCG/ACT inhaler Error   potassium chloride SA (KLOR-CON) 20 MEQ tablet Dose change   furosemide (LASIX) 40 MG tablet Reorder    Orders Placed This Encounter  Procedures   Basic metabolic panel   PCV ECHOCARDIOGRAM COMPLETE    Standing Status:   Future    Standing Expiration Date:   12/23/2021     Recommendations:   Tamirra A Plunk  is a  80 y.o.  female  with hypothyroidism, hypertension, depression/anxiety, OSA on CPAP, history of left breast cancer, hypocalcemia, CKD stage 3, chronic diastolic heart failure.   Patient is presently enrolled in our p.m. and PCM for heart failure management with our clinic.    Patient presents for 76-monthfollow-up of hypertension, chronic diastolic heart failure.  She is presently following closely for management of dyspnea per pulmonology.  Patient's dyspnea on exertion has significantly improved since last visit.  There are no clinical signs of acute decompensated heart failure and lungs are now clear to auscultation bilaterally.  Patient has noticed increased need for additional Lasix doses, particularly after she eats something salty.  Advised patient she may take Lasix 80 mg once daily with increased potassium supplementation.  We will repeat BMP in 1 week.  We will also obtain  repeat echocardiogram prior to next follow-up visit.  Patient is otherwise stable from a cardiovascular standpoint.  Again discussed with patient regarding importance of dietary and medication compliance as well as weight loss, she verbalized understanding. Blood pressure is well controlled.   Follow-up in  6 months, sooner if needed, for chronic diastolic heart failure and hypertension.   Alethia Berthold, PA-C 12/23/2020, 2:35 PM Office: [phone]

## 2020-12-31 LAB — BASIC METABOLIC PANEL
BUN/Creatinine Ratio: 17 (ref 12–28)
BUN: 18 mg/dL (ref 8–27)
CO2: 20 mmol/L (ref 20–29)
Calcium: 10.4 mg/dL — ABNORMAL HIGH (ref 8.7–10.3)
Chloride: 104 mmol/L (ref 96–106)
Creatinine, Ser: 1.04 mg/dL — ABNORMAL HIGH (ref 0.57–1.00)
Glucose: 109 mg/dL — ABNORMAL HIGH (ref 65–99)
Potassium: 3.7 mmol/L (ref 3.5–5.2)
Sodium: 143 mmol/L (ref 134–144)
eGFR: 55 mL/min/{1.73_m2} — ABNORMAL LOW (ref >59)

## 2021-01-01 NOTE — Progress Notes (Signed)
Please inform patient her kidney function deteriorated with increased dose of Lasix.  Advised her to reduce Lasix back to 60 mg daily and to avoid salt intake.  Advised her to stay well-hydrated.

## 2021-01-01 NOTE — Progress Notes (Signed)
Patient voiced understanding, but she did mention that she only an 80mg  tablet that she can cut in half to make 40mg . Is that ok? Please advise.

## 2021-01-02 NOTE — Progress Notes (Signed)
Yes she can cut it in half

## 2021-01-02 NOTE — Progress Notes (Signed)
Called and spoke with patient regarding her taking Lasix 40mg , she will continue cut the 80mg  tablet in half.

## 2021-01-09 ENCOUNTER — Other Ambulatory Visit: Payer: Self-pay

## 2021-01-09 ENCOUNTER — Ambulatory Visit
Admission: RE | Admit: 2021-01-09 | Discharge: 2021-01-09 | Disposition: A | Discharge status: Home / Self Care | Payer: Medicare HMO | Source: Ambulatory Visit | Attending: Physician Assistant | Admitting: Physician Assistant

## 2021-01-09 DIAGNOSIS — Z1231 Encounter for screening mammogram for malignant neoplasm of breast: Secondary | ICD-10-CM

## 2021-01-12 ENCOUNTER — Ambulatory Visit: Payer: Medicare HMO | Admitting: Cardiology

## 2021-02-10 ENCOUNTER — Ambulatory Visit
Admission: RE | Admit: 2021-02-10 | Discharge: 2021-02-10 | Disposition: A | Discharge status: Home / Self Care | Payer: Medicare HMO | Source: Ambulatory Visit | Attending: Pulmonary Disease | Admitting: Pulmonary Disease

## 2021-02-10 DIAGNOSIS — J849 Interstitial pulmonary disease, unspecified: Secondary | ICD-10-CM

## 2021-03-03 ENCOUNTER — Other Ambulatory Visit: Payer: Self-pay

## 2021-03-03 ENCOUNTER — Ambulatory Visit (INDEPENDENT_AMBULATORY_CARE_PROVIDER_SITE_OTHER): Payer: Medicare HMO | Admitting: Pulmonary Disease

## 2021-03-03 ENCOUNTER — Encounter: Payer: Self-pay | Admitting: Pulmonary Disease

## 2021-03-03 VITALS — BP 140/64 | HR 64 | Temp 98.2°F | Ht 61.5 in | Wt 259.0 lb

## 2021-03-03 DIAGNOSIS — G4733 Obstructive sleep apnea (adult) (pediatric): Secondary | ICD-10-CM | POA: Diagnosis not present

## 2021-03-03 DIAGNOSIS — J4531 Mild persistent asthma with (acute) exacerbation: Secondary | ICD-10-CM

## 2021-03-03 DIAGNOSIS — Z8616 Personal history of COVID-19: Secondary | ICD-10-CM

## 2021-03-03 NOTE — Progress Notes (Signed)
[facility]  Chief Complaint  Patient presents with   Follow-up    Follow up for CAT Scan.     Constitutional:  BP 140/64 (BP Location: Right Arm, Patient Position: Sitting, Cuff Size: Large)   Pulse 64   Temp 98.2 F (36.8 C) (Oral)   Ht 5' 1.5" (1.562 m)   Wt 259 lb (117.5 kg)   SpO2 95%   BMI 48.15 kg/m   Past Medical History:  Anemia, Anxiety, Breast cancer 1988, Collagenous colitis, L1 compression fracture, HTN, Hypothyroidism, OA, Osteopenia, Diastolic CHF, CKD 3a, COVID 23 July 2020  Past Surgical History:  Her  has a past surgical history that includes Modified radical mastectomy w/ axillary lymph node dissection; Breast reduction surgery; Toe Surgery; Septoplasty; Cesarean section; Tonsillectomy and adenoidectomy; right foot surgery; Reconstruction breast w/ latissimus dorsi flap; Colonoscopy w/ biopsies and polypectomy; Cataract extraction w/ intraocular lens  implant, bilateral; Kyphoplasty (N/A, 02/18/2016); Cyst excision (N/A, 08/30/2018); and Reduction mammaplasty (Right).  Brief Summary:  [name] is a 80 y.o. female with obstructive sleep apnea and asthma.    Subjective:   She had CT chest.  Showed mild scarring and mild air trapping.  She is not having cough, wheeze, or sputum.  Mobility limited because of knee pains.  She is waiting to get injections for this.  Has been using tramadol.  Helps with pain, but has made her feel bloated.  Uses CPAP nightly.  No issues with mask fit or pressure.  Physical Exam:   Appearance - well kempt, using a walker  ENMT - no sinus tenderness, no oral exudate, no LAN, Mallampati 3 airway, no stridor  Respiratory - equal breath sounds bilaterally, no wheezing or rales  CV - s1s2 regular rate and rhythm, no murmurs  Ext - no clubbing, no edema  Skin - no rashes  Psych - normal mood and affect   Pulmonary Tests:  Spirometry 06/06/12>>FEV1 1.48 (69%), FVC 1.95 (70%),  FEV1% 76 PFT 07/27/12 >> FEV1 1.71 (88%), FEV1% 77, FEF 25-75% 1.44 (64%), TLC 3.58 (77%), DLCO 72%, no BD. Spirometry 10/24/17 >> FEV1 1.15 (63%), FEV1% 83 FeNO 10/24/17 >> 19  Chest imaging:  CT angio chest 09/23/20 >> GGO b/l with patchy ASD LUL HRCT 02/11/21 >> mild irregular peripheral interstitial opacity and some septal thickening at lung bases, air trapping (indeterminate for UIP)  Sleep Tests:  PSG 12/01/06>>AHI 10, SpO2 low 80%, PLMI 0  CPAP titration 02/16/07>>CPAP 11 cm H2O CPAP 01/31/21 to 03/01/21 >> used on 30 of 30 nights with average 10 hrs 14 min.  Average AHI 2.6 with CPAP 11 cm H2O  Cardiac Tests:  Echo 12/07/19 >> EF 57%, grade 2 DD, mild MR  Social History:  She  reports that she has never smoked. She has never used smokeless tobacco. She reports current alcohol use of about 1.0 standard drink per week. She reports that she does not use drugs.  Family History:  Her family history includes Brain cancer in her maternal aunt; Breast cancer in her maternal aunt and another family member; Hypertension in her brother, father, and mother; Kidney cancer in her paternal uncle and another family member; Lung cancer in her mother; Ovarian cancer in her sister and another family member; Prostate cancer in her paternal uncle.     Assessment/Plan:   History COVID 19 pneumonia pneumonia with presumed bacterial superinfection. 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 - she is compliant with CPAP and reports benefit - she uses [employer] for her DME - continue CPAP 11 cm H2O  Moderate persistent asthma. - symbicort wasn't covered by insurance - continue advair with prn albuterol  Dyspnea on exertion. - she is not able to walk more than 200 feet without have to stop and rest - previously completed  handicap parking form completed for her  Obesity. - she is aware of how her weight can impact her health, particularly with regard to sleep apnea  Chronic diastolic CHF. - followed by Dr. Einar Gip   Time Spent Involved in Patient Care on Day of Examination:  33 minutes  Follow up:   Patient Instructions  Follow up in 6 months   Medication List:   Allergies as of 03/03/2021       Reactions   Nebivolol Hcl Other (See Comments)   Ibuprofen Swelling   Patient reports swelling of legs    Adhesive [tape] Other (See Comments)   UNSPECIFIED    Bactrim [sulfamethoxazole-trimethoprim] Other (See Comments)   UNSPECIFIED   Dairy Aid [lactase] Other (See Comments)   Ice cream, sour cream   Lactose Intolerance (gi) Other (See Comments)   Microplegia Msa-msg [plegisol] Other (See Comments)   Sulfa Antibiotics Other (See Comments)   UNSPECIFIED   Meloxicam Nausea Only        Medication List        Accurate as of March 03, 2021 12:23 PM. If you have any questions, ask your nurse or doctor.          Advair HFA 115-21 MCG/ACT inhaler Generic drug: fluticasone-salmeterol Inhale 2 puffs into the lungs 2 (two) times daily.   albuterol (2.5 MG/3ML) 0.083% nebulizer solution Commonly known as: PROVENTIL Take 3 mLs (2.5 mg dose) by nebulization every 6 (six) hours as needed for Wheezing.   albuterol 108 (90 Base) MCG/ACT inhaler Commonly known as: ProAir HFA USE 2 INHALATIONS EVERY 6 HOURS AS NEEDED FOR WHEEZING OR SHORTNESS OF BREATH   CITRACAL + D PO Take 2 tablets by mouth daily.   clobetasol cream 0.05 % Commonly known as: TEMOVATE Apply 1 application topically daily as needed (irritation).   dextromethorphan-guaiFENesin 30-600 MG 12hr tablet Commonly known as: MUCINEX DM Take 1 tablet by mouth every 12 (twelve) hours.   diclofenac sodium 1 % Gel Commonly known as: VOLTAREN as needed.   diltiazem 240 MG 24 hr capsule Commonly known as: CARDIZEM CD Take 240 mg by  mouth daily.   furosemide 80 MG tablet Commonly known as: LASIX Take 1 tablet (80 mg total) by mouth daily. Can take additional dose if weigh increase >2 lbs in 2 days   glucosamine-chondroitin 500-400 MG tablet Take 1 tablet by mouth 3 (three) times daily. Patient taking 4 times daily.   hydrALAZINE 50 MG tablet Commonly known as: APRESOLINE TAKE 1 TABLET THREE TIMES A DAY   levothyroxine 88 MCG tablet Commonly known as: SYNTHROID Take 88 mcg by mouth daily.   losartan 100 MG tablet Commonly known as: COZAAR Take 100 mg by mouth daily.   multivitamin with minerals tablet Take 1 tablet by mouth daily.   naproxen sodium 220 MG tablet Commonly known as: ALEVE Take 220 mg by mouth daily as needed.   NON FORMULARY Mega b stress once a day   OrthoVisc 30 MG/2ML Sosy Generic drug: Hyaluronan  Inject 6 mLs into the skin every 6 (six) months.   Polyethyl Glycol-Propyl Glycol 0.4-0.3 % Soln 1 drop as needed.   potassium chloride SA 20 MEQ tablet Commonly known as: Klor-Con M20 Take 3 tablets (60 mEq total) by mouth daily.   Prolia 60 MG/ML Sosy injection Generic drug: denosumab every 6 (six) months.   sertraline 100 MG tablet Commonly known as: ZOLOFT Take 100 mg by mouth 2 (two) times daily.   traMADol 50 MG tablet Commonly known as: ULTRAM Take 50 mg by mouth 3 (three) times daily as needed.        Signature:  Chesley Mires, MD [place] Pager - [phone] 03/03/2021, 12:23 PM

## 2021-03-03 NOTE — Patient Instructions (Signed)
Follow up in 6 months 

## 2021-03-06 ENCOUNTER — Other Ambulatory Visit: Payer: Self-pay | Admitting: Cardiology

## 2021-03-06 DIAGNOSIS — I10 Essential (primary) hypertension: Secondary | ICD-10-CM

## 2021-03-27 ENCOUNTER — Other Ambulatory Visit: Payer: Self-pay | Admitting: Adult Health

## 2021-05-04 ENCOUNTER — Encounter: Payer: Self-pay | Admitting: Pharmacist

## 2021-05-04 NOTE — Progress Notes (Signed)
.  CARE PLAN ENTRY  05/04/2021 Name: [name] MRN: [id number] DOB: 1941-05-15  [name] is enrolled in Remote Patient Monitoring/Principle Care Monitoring.  Date of Enrollment: 11/30/19 Supervising physician: Adrian Prows Indication: HTN  Remote Readings: Compliant, Weight stable at 250 - 252 lbs.   Next scheduled OV: 07/01/21  Pharmacist Clinical Goal(s):  Over the next 90 days, patient will demonstrate Improved medication adherence as evidenced by medication fill history Over the next 90 days, patient will demonstrate improved understanding of prescribed medications and rationale for usage as evidenced by patient teach back Over the next 90 days, patient will experience decrease in ED visits. ED visits in last 6 months = 0 Over the next 90 days, patient will not experience hospital admission. Hospital Admissions in last 6 months = 0  Interventions: Provider and Inter-disciplinary care team collaboration (see longitudinal plan of care) Comprehensive medication review performed. Discussed plans with patient for ongoing care management follow up and provided patient with direct contact information for care management team Collaboration with provider re: medication management  Patient Self Care Activities:  Self administers medications as prescribed Attends all scheduled provider appointments Performs ADL's independently Performs IADL's independently  Allergies  Allergen Reactions   Nebivolol Hcl Other (See Comments)   Ibuprofen Swelling    Patient reports swelling of legs    Adhesive [Tape] Other (See Comments)    UNSPECIFIED    Bactrim [Sulfamethoxazole-Trimethoprim] Other (See Comments)    UNSPECIFIED    Dairy Aid [Tilactase] Other (See Comments)    Ice cream, sour cream   Lactose Intolerance (Gi) Other (See Comments)   Microplegia Msa-Msg [Plegisol] Other (See Comments)   Sulfa Antibiotics Other (See Comments)    UNSPECIFIED    Meloxicam Nausea Only    Outpatient Encounter Medications as of 05/04/2021  Medication Sig   ADVAIR HFA 115-21 MCG/ACT inhaler USE 2 INHALATIONS TWICE A DAY   albuterol (PROAIR HFA) 108 (90 Base) MCG/ACT inhaler USE 2 INHALATIONS EVERY 6 HOURS AS NEEDED FOR WHEEZING OR SHORTNESS OF BREATH   albuterol (PROVENTIL) (2.5 MG/3ML) 0.083% nebulizer solution Take 3 mLs (2.5 mg dose) by nebulization every 6 (six) hours as needed for Wheezing.   Calcium Citrate-Vitamin D (CITRACAL + D PO) Take 2 tablets by mouth daily.   clobetasol cream (TEMOVATE) 4.31 % Apply 1 application topically daily as needed (irritation).   denosumab (PROLIA) 60 MG/ML SOSY injection every 6 (six) months.   dextromethorphan-guaiFENesin (MUCINEX DM) 30-600 MG per 12 hr tablet Take 1 tablet by mouth every 12 (twelve) hours.   diclofenac sodium (VOLTAREN) 1 % GEL as needed.   diltiazem (CARDIZEM CD) 240 MG 24 hr capsule Take 240 mg by mouth daily.   furosemide (LASIX) 80 MG tablet Take 1 tablet (80 mg total) by mouth daily. Can take additional dose if weigh increase >2 lbs in 2 days   glucosamine-chondroitin 500-400 MG tablet Take 1 tablet by mouth 3 (three) times daily. Patient taking 4 times daily.   hydrALAZINE (APRESOLINE) 50 MG tablet TAKE 1 TABLET THREE TIMES A DAY   levothyroxine (SYNTHROID, LEVOTHROID) 88 MCG tablet Take 88 mcg by mouth daily.   losartan (COZAAR) 100 MG tablet Take 100 mg by mouth daily.   Multiple Vitamins-Minerals (MULTIVITAMIN WITH MINERALS) tablet Take 1 tablet by mouth daily.   naproxen sodium (ALEVE) 220 MG tablet Take 220 mg by mouth daily as needed.   NON FORMULARY Mega b stress once a day   ORTHOVISC 30 MG/2ML SOSY Inject  6 mLs into the skin every 6 (six) months.   Polyethyl Glycol-Propyl Glycol 0.4-0.3 % SOLN 1 drop as needed.   potassium chloride SA (KLOR-CON M20) 20 MEQ tablet Take 3 tablets (60 mEq total) by mouth daily.   sertraline (ZOLOFT) 100 MG tablet Take 100 mg by mouth 2 (two) times daily.    traMADol  (ULTRAM) 50 MG tablet Take 50 mg by mouth 3 (three) times daily as needed.   No facility-administered encounter medications on file as of 05/04/2021.     Heart Failure   Type: Diastolic  Last ejection fraction: 57% NYHA Class: II (slight limitation of activity) AHA HF Stage: B (Heart disease present - no symptoms present)  Patient is currently controlled on the following medications: hydralazine 50 mg TID, lasix 80 mg, diltiazem 240 mg, losartan 100 mg  We discussed diet and exercise extensively and weighing daily; if you gain more than 3 pounds in one day or 5 pounds in one week call your doctor  Plan  Continue current medications and control with diet and exercise ______________ Visit Information SDOH (Social Determinants of Health) assessments performed: Yes.  [name] was given information about Principle Care Management/Remote Patient Monitoring services today including:  RPM/PCM service includes personalized support from designated clinical staff supervised by her physician, including individualized plan of care and coordination with other care providers 24/7 contact phone numbers for assistance for urgent and routine care needs. Standard insurance, coinsurance, copays and deductibles apply for principle care management only during months in which we provide at least 30 minutes of these services. Most insurances cover these services at 100%, however patients may be responsible for any copay, coinsurance and/or deductible if applicable. This service may help you avoid the need for more expensive face-to-face services. Only one practitioner may furnish and bill the service in a calendar month. The patient may stop PCM/RPM services at any time (effective at the end of the month) by phone call to the office staff.  Patient agreed to services and verbal consent obtained.   Manuela Schwartz, Pharm.D. [facility] [phone] Ext:  [phone]

## 2021-05-12 ENCOUNTER — Other Ambulatory Visit: Payer: Self-pay | Admitting: Pulmonary Disease

## 2021-05-12 DIAGNOSIS — J4530 Mild persistent asthma, uncomplicated: Secondary | ICD-10-CM

## 2021-05-18 ENCOUNTER — Other Ambulatory Visit: Payer: Self-pay | Admitting: Orthopedic Surgery

## 2021-05-18 DIAGNOSIS — M25551 Pain in right hip: Secondary | ICD-10-CM

## 2021-05-20 ENCOUNTER — Other Ambulatory Visit: Payer: Self-pay | Admitting: Orthopedic Surgery

## 2021-05-20 DIAGNOSIS — M25551 Pain in right hip: Secondary | ICD-10-CM

## 2021-05-27 ENCOUNTER — Ambulatory Visit
Admission: RE | Admit: 2021-05-27 | Discharge: 2021-05-27 | Disposition: A | Discharge status: Home / Self Care | Payer: TRICARE For Life (TFL) | Source: Ambulatory Visit | Attending: Orthopedic Surgery | Admitting: Orthopedic Surgery

## 2021-05-27 ENCOUNTER — Ambulatory Visit
Admission: RE | Admit: 2021-05-27 | Discharge: 2021-05-27 | Disposition: A | Discharge status: Home / Self Care | Payer: Medicare HMO | Source: Ambulatory Visit | Attending: Orthopedic Surgery | Admitting: Orthopedic Surgery

## 2021-05-27 DIAGNOSIS — M25551 Pain in right hip: Secondary | ICD-10-CM

## 2021-06-03 ENCOUNTER — Encounter (HOSPITAL_COMMUNITY): Payer: Self-pay | Admitting: Orthopedic Surgery

## 2021-06-03 ENCOUNTER — Other Ambulatory Visit: Payer: Self-pay | Admitting: Orthopedic Surgery

## 2021-06-03 DIAGNOSIS — Z8616 Personal history of COVID-19: Secondary | ICD-10-CM | POA: Diagnosis not present

## 2021-06-03 DIAGNOSIS — Z79899 Other long term (current) drug therapy: Secondary | ICD-10-CM | POA: Diagnosis not present

## 2021-06-03 DIAGNOSIS — F419 Anxiety disorder, unspecified: Secondary | ICD-10-CM | POA: Diagnosis not present

## 2021-06-03 DIAGNOSIS — I110 Hypertensive heart disease with heart failure: Secondary | ICD-10-CM | POA: Diagnosis not present

## 2021-06-03 DIAGNOSIS — Z9221 Personal history of antineoplastic chemotherapy: Secondary | ICD-10-CM | POA: Diagnosis not present

## 2021-06-03 DIAGNOSIS — Z6841 Body Mass Index (BMI) 40.0 and over, adult: Secondary | ICD-10-CM | POA: Diagnosis not present

## 2021-06-03 DIAGNOSIS — I5032 Chronic diastolic (congestive) heart failure: Secondary | ICD-10-CM | POA: Diagnosis not present

## 2021-06-03 DIAGNOSIS — J449 Chronic obstructive pulmonary disease, unspecified: Secondary | ICD-10-CM | POA: Diagnosis not present

## 2021-06-03 DIAGNOSIS — M8008XA Age-related osteoporosis with current pathological fracture, vertebra(e), initial encounter for fracture: Secondary | ICD-10-CM | POA: Diagnosis present

## 2021-06-03 DIAGNOSIS — E039 Hypothyroidism, unspecified: Secondary | ICD-10-CM | POA: Diagnosis not present

## 2021-06-03 DIAGNOSIS — Z853 Personal history of malignant neoplasm of breast: Secondary | ICD-10-CM | POA: Diagnosis not present

## 2021-06-03 DIAGNOSIS — G4733 Obstructive sleep apnea (adult) (pediatric): Secondary | ICD-10-CM | POA: Diagnosis not present

## 2021-06-03 NOTE — Anesthesia Preprocedure Evaluation (Addendum)
Anesthesia Evaluation  Patient identified by MRN, date of birth, ID band Patient awake    Reviewed: Allergy & Precautions, NPO status , Patient's Chart, lab work & pertinent test results  History of Anesthesia Complications (+) PONV  Airway Mallampati: I  TM Distance: >3 FB Neck ROM: Full    Dental  (+) Caps, Dental Advisory Given   Pulmonary shortness of breath, sleep apnea and Continuous Positive Airway Pressure Ventilation , COPD,  COPD inhaler,    breath sounds clear to auscultation       Cardiovascular hypertension, Pt. on medications (-) angina+ DOE   Rhythm:Regular Rate:Normal  '21 ECHO: EF 57%, mod LVH with normal LVF, mild MR, mild TR '20 Stress: normal perfusion with EF 69%   Neuro/Psych Anxiety Compression fracture    GI/Hepatic negative GI ROS, Neg liver ROS,   Endo/Other  Hypothyroidism Morbid obesity  Renal/GU negative Renal ROS     Musculoskeletal   Abdominal (+) + obese,   Peds  Hematology negative hematology ROS (+)   Anesthesia Other Findings Breast cancer  Reproductive/Obstetrics                           Anesthesia Physical Anesthesia Plan  ASA: 3  Anesthesia Plan: General   Post-op Pain Management: Tylenol PO (pre-op)   Induction: Intravenous  PONV Risk Score and Plan: 4 or greater and Ondansetron, Dexamethasone and Treatment may vary due to age or medical condition  Airway Management Planned: Oral ETT  Additional Equipment: None  Intra-op Plan:   Post-operative Plan: Extubation in OR  Informed Consent:     Dental advisory given  Plan Discussed with: CRNA and Surgeon  Anesthesia Plan Comments: (PAT note written 06/03/2021 by Myra Gianotti, PA-C. )      Anesthesia Quick Evaluation

## 2021-06-03 NOTE — Progress Notes (Signed)
Anesthesia Chart Review:  Case: [id number] Date/Time: 06/04/21 1217   Procedure: LUMBAR 2 KYPHOPLASTY   Anesthesia type: General   Pre-op diagnosis: LUMBAR 2 COMPRESSION FRACTURE   Location: [facility] OR ROOM 05 / [place]   Surgeons: Phylliss Bob, MD       DISCUSSION: Patient is a 80 year old female scheduled for the above procedure. Case was added on 06/03/21 reportedly due to patient's significant pain and disability related to her L2 compression fracture.   History includes never smoker, post-operative N/V, HTN, asthma, OSA, hypothyroidism, breast cancer (left modified radical mastectomy w/ axillary lymph node dissection 1988 for "multifocal microscopic invasive ductal breast cancer", s/p chemotherapy; left latissimus flap 6144 initially complicated by necrosis, redo left breast reconstruction with implant & right reduction mammaplasty for symmetry), anemia, collagenous colitis, lumbar compression fracture (s/p L1 kyphoplasty 02/18/16), COVID-19 (07/18/20).  Last pulmonology visit with Dr. Halford Chessman on 03/03/21 for follow-up OSA, moderate persistent asthma, chronic dyspnea on exertion, and COVID-19 pneumonia (07/2020 with asthma exacerbation and CAP 09/2020, required home O2). She was compliant with CPAP (11 cm H2O). Continue Advair with as needed albuterol for asthma (insurance would not cover Symbicort). 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  She had no significant respiratory symptoms at that time, continue to monitor clinically.  75-month follow-up planned.  Last cardiology visit 12/23/20 with Lawerance Cruel, PA-C for follow-up HTN and chronic diastolic CHF.  She noted patient's dyspnea on exertion had significantly improved since previous visit.  No clinical signs of acute decompensated heart failure at that time.  Patient did report increased need for additional Lasix doses so Lasix increased to 80 mg daily and KCl supplement increased as well. Six  mont follow-up planned with routine echo prior to visit (echo. (Office visit scheduled 07/01/21 with echo 06/22/21.) In the interim, she did have a Remote Patient Monitoring/Principle Care Monitoring visit on 05/04/21 with pharmacist Manuela Schwartz, [facility] who noted patient with stable weights and "Patient is currently controlled on the following medications: hydralazine 50 mg TID, lasix 80 mg, diltiazem 240 mg, losartan 100 mg".  She is a same day work-up, so labs and anesthesia team evaluation on the day of surgery.    VS:  BP Readings from Last 3 Encounters:  03/03/21 140/64  12/23/20 (!) 134/56  10/21/20 140/78   Pulse Readings from Last 3 Encounters:  03/03/21 64  12/23/20 64  10/21/20 71     PROVIDERS: [name] is PCP. Annual [employer] visit 04/27/21. ([place]) Chesley Mires, MD is pulmonologist      Adrian Prows, MD is cardiologist Magrinat, Sarajane Jews, MD is HEM-ONC. Last visit 12/17/14 and discharged to survivorship program.   LABS: For day of surgery as indicated. Currently, last results include: Glucose 89, BUN 14, creatinine 0.75, potassium 4.1, sodium 142, AST 22, ALT 19 on 04/27/21 ([facility]). H/H 12.2/36.0, platelets 185 on 09/02/20.    Pulmonary & Sleep Tests (as outlined by Dr. Halford Chessman): Spirometry 06/06/12>>FEV1 1.48 (69%), FVC 1.95 (70%), FEV1% 76 PFT 07/27/12 >> FEV1 1.71 (88%), FEV1% 77, FEF 25-75% 1.44 (64%), TLC 3.58 (77%), DLCO 72%, no BD. Spirometry 10/24/17 >> FEV1 1.15 (63%), FEV1% 83 FeNO 10/24/17 >> 19  PSG 12/01/06>>AHI 10, SpO2 low 80%, PLMI 0  CPAP titration 02/16/07>>CPAP 11 cm H2O CPAP 01/31/21 to 03/01/21 >> used on 30 of 30 nights with average 10 hrs 14 min.  Average AHI 2.6 with CPAP 11 cm H2O  IMAGES: MRI L-spine 05/27/21: IMPRESSION: 1. Chronic L1 compression fracture status post kyphoplasty. Abnormal edema surrounding the cement augmentation is concerning for abnormal motion with stress reaction and/or traumatic injury.  Chronic 1 cm of bony retropulsion of a large bony fragment into the canal with resulting severe canal stenosis at L1 and compression of the conus. 2. Acute/recent L2 fracture with marrow edema and approximately 50% height loss. 3. Moderate to severe foraminal stenosis bilaterally at T11-T12 and on the left at L5-S1. Moderate foraminal stenosis on the right at L5-S1.  MRI Pelvis/Right Hip 05/27/21: IMPRESSION: 1. Tendinosis and partial tearing of the distal right iliopsoas tendon. 2. Mild left common hamstring and bilateral gluteus tendinosis. 3. No acute osseous findings or significant arthropathic changes at either hip. Mild sacroiliac degenerative changes bilaterally. Lumbar spine findings are dictated separately.   CT Chest (high resolution) 02/10/21: IMPRESSION: 1. There is mild irregular peripheral interstitial opacity and some septal thickening at the lung bases, without clear evidence of bronchiectasis, bronchiolectasis, or honeycombing. There is some involvement of non dependent portions of the right middle lobe and lingula. Constellation of findings is consistent with an early "indeterminate for UIP" pattern by pulmonary fibrosis criteria. Consider ongoing annual CT follow-up to evaluate for stability of fibrotic findings and pattern. Findings are indeterminate for UIP per consensus guidelines: Diagnosis of Idiopathic Pulmonary Fibrosis: An Official ATS/ERS/JRS/ALAT Clinical Practice Guideline. [place], Iss 5, [phone], Mar 05 2017. 2. Lobular air trapping on expiratory phase imaging, most suggestive of small airways disease, however this can be a feature of pulmonary fibrosis such as chronic fibrosing hypersensitivity pneumonitis. 3. Coronary artery disease. - Aortic Atherosclerosis (ICD10-I70.0).    EKG: 07/14/2020: Sinus rhythm at the rate of 63 bpm with borderline first-degree AV block, no evidence of ischemia, normal EKG otherwise.      CV: Echocardiogram 12/07/2019:  Left ventricle cavity is normal in size. Moderate concentric hypertrophy  of the left ventricle. Normal global wall motion. Normal LV systolic function with EF 57%. Doppler evidence of grade II (pseudonormal)  diastolic dysfunction, elevated LAP.  Left atrial cavity is mildly dilated.  Mild (Grade I) mitral regurgitation.  Mild tricuspid regurgitation. RA-RV gradient 20 mmHg.  No significant change compared to previous study on 12/26/2018.   Lexiscan Myoview stress test 01/17/2019: Lexiscan stress test was performed. Stress EKG is non-diagnostic, as this is pharmacological stress test. Pharmacological myocardial perfusion imaging is normal. LVEF 69%. Low risk study.     Past Medical History:  Diagnosis Date   Anemia    PMH   Anxiety    Asthma    Breast cancer ([place]) 1988   left breast cancer   Collagenous colitis    Compression fracture    lumbar 1   Hypertension    Hypothyroidism    Osteoarthritis    Osteopenia    PONV (postoperative nausea and vomiting)    Sleep apnea    wears CPAP nightly    Past Surgical History:  Procedure Laterality Date   BREAST REDUCTION SURGERY     Right   CATARACT EXTRACTION W/ INTRAOCULAR LENS  IMPLANT, BILATERAL     CESAREAN SECTION     COLONOSCOPY W/ BIOPSIES AND POLYPECTOMY     CYST EXCISION N/A 08/30/2018   Procedure: excision of posterior shoulder / back sebaceous cyst;  Surgeon: Wallace Going, DO;  Location: [place];  Service: Plastics;  Laterality: N/A;   KYPHOPLASTY N/A 02/18/2016   Procedure: LUMBAR 1 KYPHOPLASTY;  Surgeon: Phylliss Bob, MD;  Location: [place];  Service: Orthopedics;  Laterality: N/A;  LUMBAR 1 KYPHOPLASTY   MODIFIED RADICAL MASTECTOMY W/ AXILLARY LYMPH NODE DISSECTION     Left   RECONSTRUCTION BREAST W/ LATISSIMUS DORSI FLAP     left   REDUCTION MAMMAPLASTY Right    right foot surgery     corrected hammer toe, straightened big toe   SEPTOPLASTY     TOE  SURGERY     TONSILLECTOMY AND ADENOIDECTOMY      MEDICATIONS: No current facility-administered medications for this encounter.    ADVAIR HFA 115-21 MCG/ACT inhaler   albuterol (PROVENTIL) (2.5 MG/3ML) 0.083% nebulizer solution   albuterol (VENTOLIN HFA) 108 (90 Base) MCG/ACT inhaler   B Complex-C (B-COMPLEX WITH VITAMIN C) tablet   Calcium Carb-Cholecalciferol (CALTRATE 600+D3 PO)   clobetasol ointment (TEMOVATE) 0.05 %   denosumab (PROLIA) 60 MG/ML SOSY injection   diclofenac sodium (VOLTAREN) 1 % GEL   diltiazem (CARDIZEM CD) 240 MG 24 hr capsule   diphenhydrAMINE (BENADRYL) 50 MG capsule   furosemide (LASIX) 80 MG tablet   hydrALAZINE (APRESOLINE) 50 MG tablet   HYDROcodone-acetaminophen (NORCO/VICODIN) 5-325 MG tablet   levothyroxine (SYNTHROID, LEVOTHROID) 88 MCG tablet   losartan (COZAAR) 100 MG tablet   magnesium oxide (MAG-OX) 400 (240 Mg) MG tablet   MELATONIN PO   methocarbamol (ROBAXIN) 500 MG tablet   Multiple Vitamins-Minerals (MULTIVITAMIN WITH MINERALS) tablet   Polyethyl Glycol-Propyl Glycol (SYSTANE) 0.4-0.3 % SOLN   Polyethylene Glycol 400 (BLINK TEARS) 0.25 % GEL   potassium chloride SA (KLOR-CON M20) 20 MEQ tablet   sertraline (ZOLOFT) 100 MG tablet   naproxen sodium (ALEVE) 220 MG tablet   ORTHOVISC 30 MG/2ML SOSY   Polyethyl Glycol-Propyl Glycol 0.4-0.3 % SOLN    Myra Gianotti, PA-C Surgical Short Stay/Anesthesiology [facility] Phone [phone] [facility] Phone [phone] 06/03/2021 2:18 PM

## 2021-06-03 NOTE — Progress Notes (Signed)
DUE TO COVID-19 ONLY ONE VISITOR IS ALLOWED TO COME WITH YOU AND STAY IN THE WAITING ROOM ONLY DURING PRE OP AND PROCEDURE DAY OF SURGERY.   PCP - Roe Coombs, PA Cardiologist - Dr Adrian Prows Pulmonology - Dr Chesley Mires  Chest x-ray - 09/23/20 EKG - 07/14/20 Stress Test - 7/15/220 PVC ECHO - 12/07/19 Cardiac Cath - n/a  ICD Pacemaker/Loop - n/a  Sleep Study -  Yes CPAP - wears nightly  ERAS: Clear liquids til 9:30 AM on DOS.  Anesthesia review: Yes  STOP now taking any Aspirin (unless otherwise instructed by your surgeon), Aleve, Naproxen, Ibuprofen, Motrin, Advil, Goody's, BC's, all herbal medications, fish oil, and all vitamins.   Coronavirus Screening Covid test n/a Ambulatory Surgery  Do you have any of the following symptoms:  Cough yes/no: No Fever (>100.24F)  yes/no: No Runny nose yes/no: No Sore throat yes/no: No Difficulty breathing/shortness of breath  yes/no: No  Have you traveled in the last 14 days and where? yes/no: No  Patient verbalized understanding of instructions that were given via phone.

## 2021-06-04 ENCOUNTER — Ambulatory Visit (HOSPITAL_COMMUNITY)
Admission: RE | Admit: 2021-06-04 | Discharge: 2021-06-04 | Disposition: A | Discharge status: Home / Self Care | Payer: Medicare HMO | Attending: Orthopedic Surgery | Admitting: Orthopedic Surgery

## 2021-06-04 ENCOUNTER — Encounter (HOSPITAL_COMMUNITY): Payer: Self-pay | Admitting: Orthopedic Surgery

## 2021-06-04 ENCOUNTER — Ambulatory Visit (HOSPITAL_COMMUNITY): Payer: Medicare HMO | Admitting: Vascular Surgery

## 2021-06-04 ENCOUNTER — Other Ambulatory Visit: Payer: Self-pay

## 2021-06-04 ENCOUNTER — Ambulatory Visit (HOSPITAL_COMMUNITY): Payer: Medicare HMO

## 2021-06-04 ENCOUNTER — Encounter (HOSPITAL_COMMUNITY)
Admission: RE | Disposition: A | Discharge status: Home / Self Care | Payer: Self-pay | Source: Home / Self Care | Attending: Orthopedic Surgery

## 2021-06-04 DIAGNOSIS — J449 Chronic obstructive pulmonary disease, unspecified: Secondary | ICD-10-CM | POA: Insufficient documentation

## 2021-06-04 DIAGNOSIS — E039 Hypothyroidism, unspecified: Secondary | ICD-10-CM | POA: Insufficient documentation

## 2021-06-04 DIAGNOSIS — I5032 Chronic diastolic (congestive) heart failure: Secondary | ICD-10-CM | POA: Diagnosis not present

## 2021-06-04 DIAGNOSIS — G4733 Obstructive sleep apnea (adult) (pediatric): Secondary | ICD-10-CM | POA: Diagnosis not present

## 2021-06-04 DIAGNOSIS — I110 Hypertensive heart disease with heart failure: Secondary | ICD-10-CM | POA: Insufficient documentation

## 2021-06-04 DIAGNOSIS — Z419 Encounter for procedure for purposes other than remedying health state, unspecified: Secondary | ICD-10-CM

## 2021-06-04 DIAGNOSIS — Z853 Personal history of malignant neoplasm of breast: Secondary | ICD-10-CM | POA: Insufficient documentation

## 2021-06-04 DIAGNOSIS — Z9221 Personal history of antineoplastic chemotherapy: Secondary | ICD-10-CM | POA: Insufficient documentation

## 2021-06-04 DIAGNOSIS — M8008XA Age-related osteoporosis with current pathological fracture, vertebra(e), initial encounter for fracture: Secondary | ICD-10-CM | POA: Insufficient documentation

## 2021-06-04 DIAGNOSIS — Z79899 Other long term (current) drug therapy: Secondary | ICD-10-CM | POA: Insufficient documentation

## 2021-06-04 DIAGNOSIS — Z6841 Body Mass Index (BMI) 40.0 and over, adult: Secondary | ICD-10-CM | POA: Insufficient documentation

## 2021-06-04 DIAGNOSIS — F419 Anxiety disorder, unspecified: Secondary | ICD-10-CM | POA: Insufficient documentation

## 2021-06-04 DIAGNOSIS — Z8616 Personal history of COVID-19: Secondary | ICD-10-CM | POA: Insufficient documentation

## 2021-06-04 HISTORY — DX: Dyspnea, unspecified: R06.00

## 2021-06-04 HISTORY — PX: KYPHOPLASTY: SHX5884

## 2021-06-04 LAB — CBC WITH DIFFERENTIAL/PLATELET
Abs Immature Granulocytes: 0.02 10*3/uL (ref 0.00–0.07)
Basophils Absolute: 0 10*3/uL (ref 0.0–0.1)
Basophils Relative: 0 %
Eosinophils Absolute: 0.3 10*3/uL (ref 0.0–0.5)
Eosinophils Relative: 5 %
HCT: 39.7 % (ref 36.0–46.0)
Hemoglobin: 13.1 g/dL (ref 12.0–15.0)
Immature Granulocytes: 0 %
Lymphocytes Relative: 23 %
Lymphs Abs: 1.1 10*3/uL (ref 0.7–4.0)
MCH: 33.3 pg (ref 26.0–34.0)
MCHC: 33 g/dL (ref 30.0–36.0)
MCV: 101 fL — ABNORMAL HIGH (ref 80.0–100.0)
Monocytes Absolute: 0.6 10*3/uL (ref 0.1–1.0)
Monocytes Relative: 12 %
Neutro Abs: 2.9 10*3/uL (ref 1.7–7.7)
Neutrophils Relative %: 60 %
Platelets: 179 10*3/uL (ref 150–400)
RBC: 3.93 MIL/uL (ref 3.87–5.11)
RDW: 12.3 % (ref 11.5–15.5)
WBC: 4.9 10*3/uL (ref 4.0–10.5)
nRBC: 0 % (ref 0.0–0.2)

## 2021-06-04 LAB — COMPREHENSIVE METABOLIC PANEL
ALT: 24 U/L (ref 0–44)
AST: 23 U/L (ref 15–41)
Albumin: 3.4 g/dL — ABNORMAL LOW (ref 3.5–5.0)
Alkaline Phosphatase: 81 U/L (ref 38–126)
Anion gap: 8 (ref 5–15)
BUN: 17 mg/dL (ref 8–23)
CO2: 25 mmol/L (ref 22–32)
Calcium: 9 mg/dL (ref 8.9–10.3)
Chloride: 104 mmol/L (ref 98–111)
Creatinine, Ser: 0.8 mg/dL (ref 0.44–1.00)
GFR, Estimated: >60 mL/min (ref >60)
Glucose, Bld: 100 mg/dL — ABNORMAL HIGH (ref 70–99)
Potassium: 3.7 mmol/L (ref 3.5–5.1)
Sodium: 137 mmol/L (ref 135–145)
Total Bilirubin: 0.9 mg/dL (ref 0.3–1.2)
Total Protein: 6.1 g/dL — ABNORMAL LOW (ref 6.5–8.1)

## 2021-06-04 LAB — TYPE AND SCREEN
ABO/RH(D): O POS
Antibody Screen: NEGATIVE

## 2021-06-04 LAB — PROTIME-INR
INR: 1 (ref 0.8–1.2)
Prothrombin Time: 12.9 seconds (ref 11.4–15.2)

## 2021-06-04 LAB — APTT: aPTT: 37 seconds — ABNORMAL HIGH (ref 24–36)

## 2021-06-04 LAB — GLUCOSE, CAPILLARY: Glucose-Capillary: 109 mg/dL — ABNORMAL HIGH (ref 70–99)

## 2021-06-04 LAB — SURGICAL PCR SCREEN
MRSA, PCR: NEGATIVE
Staphylococcus aureus: NEGATIVE

## 2021-06-04 LAB — ABO/RH: ABO/RH(D): O POS

## 2021-06-04 SURGERY — KYPHOPLASTY
Anesthesia: General

## 2021-06-04 MED ORDER — MIDAZOLAM HCL 2 MG/2ML IJ SOLN
INTRAMUSCULAR | Status: DC | PRN
  Administered 2021-06-04: 1 mg via INTRAVENOUS

## 2021-06-04 MED ORDER — FENTANYL CITRATE (PF) 100 MCG/2ML IJ SOLN
INTRAMUSCULAR | Status: AC
  Filled 2021-06-04: qty 2

## 2021-06-04 MED ORDER — MIDAZOLAM HCL 2 MG/2ML IJ SOLN
INTRAMUSCULAR | Status: AC
  Filled 2021-06-04: qty 2

## 2021-06-04 MED ORDER — FENTANYL CITRATE (PF) 100 MCG/2ML IJ SOLN: 25.0000 ug | INTRAMUSCULAR | Status: DC | PRN

## 2021-06-04 MED ORDER — PROPOFOL 10 MG/ML IV BOLUS
INTRAVENOUS | Status: DC | PRN
  Administered 2021-06-04: 50 mg via INTRAVENOUS
  Administered 2021-06-04: 150 mg via INTRAVENOUS

## 2021-06-04 MED ORDER — ACETAMINOPHEN 500 MG PO TABS
1000.0000 mg | ORAL_TABLET | Freq: Once | ORAL | Status: AC
  Administered 2021-06-04: 1000 mg via ORAL
  Filled 2021-06-04: qty 2

## 2021-06-04 MED ORDER — EPHEDRINE 5 MG/ML INJ
INTRAVENOUS | Status: AC
  Filled 2021-06-04: qty 5

## 2021-06-04 MED ORDER — FENTANYL CITRATE (PF) 250 MCG/5ML IJ SOLN
INTRAMUSCULAR | Status: DC | PRN
  Administered 2021-06-04 (×2): 50 ug via INTRAVENOUS

## 2021-06-04 MED ORDER — DEXAMETHASONE SODIUM PHOSPHATE 10 MG/ML IJ SOLN
INTRAMUSCULAR | Status: DC | PRN
  Administered 2021-06-04: 10 mg via INTRAVENOUS

## 2021-06-04 MED ORDER — BACITRACIN ZINC 500 UNIT/GM EX OINT
TOPICAL_OINTMENT | CUTANEOUS | Status: AC
  Filled 2021-06-04: qty 28.35

## 2021-06-04 MED ORDER — 0.9 % SODIUM CHLORIDE (POUR BTL) OPTIME
TOPICAL | Status: DC | PRN
  Administered 2021-06-04: 1000 mL

## 2021-06-04 MED ORDER — PROPOFOL 10 MG/ML IV BOLUS
INTRAVENOUS | Status: AC
  Filled 2021-06-04: qty 20

## 2021-06-04 MED ORDER — METHOCARBAMOL 500 MG PO TABS: 500.0000 mg | ORAL_TABLET | Freq: Four times a day (QID) | ORAL | 0 refills | Status: DC | PRN

## 2021-06-04 MED ORDER — POVIDONE-IODINE 7.5 % EX SOLN: Freq: Once | CUTANEOUS | Status: DC

## 2021-06-04 MED ORDER — CEFAZOLIN SODIUM-DEXTROSE 2-4 GM/100ML-% IV SOLN
2.0000 g | INTRAVENOUS | Status: AC
  Administered 2021-06-04: 2 g via INTRAVENOUS
  Filled 2021-06-04: qty 100

## 2021-06-04 MED ORDER — ORAL CARE MOUTH RINSE: 15.0000 mL | Freq: Once | OROMUCOSAL | Status: AC

## 2021-06-04 MED ORDER — ONDANSETRON HCL 4 MG/2ML IJ SOLN
INTRAMUSCULAR | Status: AC
  Filled 2021-06-04: qty 2

## 2021-06-04 MED ORDER — LACTATED RINGERS IV SOLN: INTRAVENOUS | Status: DC

## 2021-06-04 MED ORDER — ROCURONIUM BROMIDE 10 MG/ML (PF) SYRINGE
PREFILLED_SYRINGE | INTRAVENOUS | Status: DC | PRN
  Administered 2021-06-04: 60 mg via INTRAVENOUS
  Administered 2021-06-04: 20 mg via INTRAVENOUS

## 2021-06-04 MED ORDER — ROCURONIUM BROMIDE 10 MG/ML (PF) SYRINGE
PREFILLED_SYRINGE | INTRAVENOUS | Status: AC
  Filled 2021-06-04: qty 10

## 2021-06-04 MED ORDER — SUGAMMADEX SODIUM 200 MG/2ML IV SOLN
INTRAVENOUS | Status: DC | PRN
  Administered 2021-06-04: 400 mg via INTRAVENOUS

## 2021-06-04 MED ORDER — PHENYLEPHRINE HCL-NACL 20-0.9 MG/250ML-% IV SOLN
INTRAVENOUS | Status: DC | PRN
  Administered 2021-06-04: 100 ug/min via INTRAVENOUS

## 2021-06-04 MED ORDER — EPHEDRINE SULFATE-NACL 50-0.9 MG/10ML-% IV SOSY
PREFILLED_SYRINGE | INTRAVENOUS | Status: DC | PRN
  Administered 2021-06-04 (×2): 10 mg via INTRAVENOUS
  Administered 2021-06-04: 5 mg via INTRAVENOUS

## 2021-06-04 MED ORDER — HYDROCODONE-ACETAMINOPHEN 5-325 MG PO TABS: 1.0000 | ORAL_TABLET | Freq: Four times a day (QID) | ORAL | 0 refills | Status: DC | PRN

## 2021-06-04 MED ORDER — FENTANYL CITRATE (PF) 250 MCG/5ML IJ SOLN
INTRAMUSCULAR | Status: AC
  Filled 2021-06-04: qty 5

## 2021-06-04 MED ORDER — CHLORHEXIDINE GLUCONATE 0.12 % MT SOLN: 15.0000 mL | Freq: Once | OROMUCOSAL | Status: AC

## 2021-06-04 MED ORDER — LIDOCAINE 2% (20 MG/ML) 5 ML SYRINGE
INTRAMUSCULAR | Status: DC | PRN
  Administered 2021-06-04: 20 mg via INTRAVENOUS

## 2021-06-04 MED ORDER — ONDANSETRON HCL 4 MG/2ML IJ SOLN
4.0000 mg | Freq: Once | INTRAMUSCULAR | Status: AC | PRN
  Administered 2021-06-04: 4 mg via INTRAVENOUS

## 2021-06-04 MED ORDER — BACITRACIN 500 UNIT/GM EX OINT
TOPICAL_OINTMENT | CUTANEOUS | Status: DC | PRN
  Administered 2021-06-04: 1 via TOPICAL

## 2021-06-04 MED ORDER — CHLORHEXIDINE GLUCONATE 0.12 % MT SOLN
OROMUCOSAL | Status: AC
  Administered 2021-06-04: 15 mL via OROMUCOSAL
  Filled 2021-06-04: qty 15

## 2021-06-04 MED ORDER — DEXAMETHASONE SODIUM PHOSPHATE 10 MG/ML IJ SOLN
INTRAMUSCULAR | Status: AC
  Filled 2021-06-04: qty 1

## 2021-06-04 MED ORDER — IOPAMIDOL (ISOVUE-300) INJECTION 61%
INTRAVENOUS | Status: DC | PRN
  Administered 2021-06-04: 100 mL

## 2021-06-04 MED ORDER — FENTANYL CITRATE (PF) 100 MCG/2ML IJ SOLN
25.0000 ug | INTRAMUSCULAR | Status: DC | PRN
  Administered 2021-06-04 (×2): 50 ug via INTRAVENOUS

## 2021-06-04 MED ORDER — LIDOCAINE 2% (20 MG/ML) 5 ML SYRINGE
INTRAMUSCULAR | Status: AC
  Filled 2021-06-04: qty 5

## 2021-06-04 MED ORDER — BUPIVACAINE-EPINEPHRINE (PF) 0.25% -1:200000 IJ SOLN
INTRAMUSCULAR | Status: AC
  Filled 2021-06-04: qty 30

## 2021-06-04 MED ORDER — ONDANSETRON HCL 4 MG/2ML IJ SOLN: 4.0000 mg | Freq: Once | INTRAMUSCULAR | Status: DC

## 2021-06-04 SURGICAL SUPPLY — 46 items
BAG COUNTER SPONGE SURGICOUNT (BAG) IMPLANT
BLADE SURG 15 STRL LF DISP TIS (BLADE) ×1 IMPLANT
BLADE SURG 15 STRL SS (BLADE) ×1
CEMENT KYPHON C01A KIT/MIXER (Cement) ×2 IMPLANT
COVER MAYO STAND STRL (DRAPES) ×2 IMPLANT
COVER SURGICAL LIGHT HANDLE (MISCELLANEOUS) ×2 IMPLANT
CURETTE EXPRESS SZ2 7MM (INSTRUMENTS) IMPLANT
CURRETTE EXPRESS SZ2 7MM (INSTRUMENTS)
DRAPE C-ARM 42X72 X-RAY (DRAPES) ×2 IMPLANT
DRAPE HALF SHEET 40X57 (DRAPES) IMPLANT
DRAPE INCISE IOBAN 66X45 STRL (DRAPES) ×2 IMPLANT
DRAPE LAPAROTOMY T 102X78X121 (DRAPES) ×2 IMPLANT
DRAPE SURG 17X23 STRL (DRAPES) ×4 IMPLANT
DRAPE WARM FLUID 44X44 (DRAPES) ×2 IMPLANT
DURAPREP 26ML APPLICATOR (WOUND CARE) ×2 IMPLANT
GAUZE 4X4 16PLY ~~LOC~~+RFID DBL (SPONGE) ×2 IMPLANT
GAUZE SPONGE 2X2 8PLY STRL LF (GAUZE/BANDAGES/DRESSINGS) ×1 IMPLANT
GLOVE SRG 8 PF TXTR STRL LF DI (GLOVE) ×1 IMPLANT
GLOVE SURG ENC MOIS LTX SZ6.5 (GLOVE) ×2 IMPLANT
GLOVE SURG ENC MOIS LTX SZ8 (GLOVE) ×2 IMPLANT
GLOVE SURG UNDER POLY LF SZ7 (GLOVE) ×2 IMPLANT
GLOVE SURG UNDER POLY LF SZ8 (GLOVE) ×1
GOWN STRL REUS W/ TWL LRG LVL3 (GOWN DISPOSABLE) ×2 IMPLANT
GOWN STRL REUS W/ TWL XL LVL3 (GOWN DISPOSABLE) ×1 IMPLANT
GOWN STRL REUS W/TWL LRG LVL3 (GOWN DISPOSABLE) ×2
GOWN STRL REUS W/TWL XL LVL3 (GOWN DISPOSABLE) ×1
INTRODUCER DEVICE OSTEO LEVEL (INTRODUCER) ×2 IMPLANT
KIT BASIN OR (CUSTOM PROCEDURE TRAY) ×2 IMPLANT
KIT TURNOVER KIT B (KITS) ×2 IMPLANT
NEEDLE 22X1 1/2 (OR ONLY) (NEEDLE) IMPLANT
NEEDLE HYPO 25X1 1.5 SAFETY (NEEDLE) IMPLANT
NEEDLE SPNL 18GX3.5 QUINCKE PK (NEEDLE) ×4 IMPLANT
NS IRRIG 1000ML POUR BTL (IV SOLUTION) ×2 IMPLANT
PACK UNIVERSAL I (CUSTOM PROCEDURE TRAY) ×2 IMPLANT
PAD ARMBOARD 7.5X6 YLW CONV (MISCELLANEOUS) ×6 IMPLANT
POSITIONER HEAD PRONE TRACH (MISCELLANEOUS) ×2 IMPLANT
SPONGE GAUZE 2X2 8PLY STRL LF (GAUZE/BANDAGES/DRESSINGS) ×2 IMPLANT
SPONGE GAUZE 2X2 STER 10/PKG (GAUZE/BANDAGES/DRESSINGS) ×1
SUT MNCRL AB 4-0 PS2 18 (SUTURE) ×2 IMPLANT
SYR BULB IRRIG 60ML STRL (SYRINGE) ×2 IMPLANT
SYR CONTROL 10ML LL (SYRINGE) IMPLANT
TAPE CLOTH SURG 4X10 WHT LF (GAUZE/BANDAGES/DRESSINGS) ×2 IMPLANT
TOWEL GREEN STERILE (TOWEL DISPOSABLE) ×2 IMPLANT
TOWEL GREEN STERILE FF (TOWEL DISPOSABLE) ×2 IMPLANT
TRAY KYPHOPAK 15/3 ONESTEP 1ST (MISCELLANEOUS) ×2 IMPLANT
TRAY KYPHOPAK 20/3 ONESTEP 1ST (MISCELLANEOUS) IMPLANT

## 2021-06-04 NOTE — H&P (Signed)
PREOPERATIVE H&P  Chief Complaint: Low back pain  HPI: [name] is a 80 y.o. female who presents with ongoing pain in the low back  MRI reveals a subacute L2 compression fracture.  Patient has failed multiple forms of conservative care and continues to have pain (see office notes for additional details regarding the patient's full course of treatment)  Past Medical History:  Diagnosis Date   Anemia    PMH;  Patient denies this dx as of 06/03/21   Anxiety    Asthma    Breast cancer ([place]) 1988   left breast cancer   Collagenous colitis    Compression fracture    lumbar 1   Dyspnea    with exertion   Hypertension    Hypothyroidism    Osteoarthritis    Osteopenia    Pneumonia 07/2020   PONV (postoperative nausea and vomiting)    Sleep apnea    wears CPAP nightly   Past Surgical History:  Procedure Laterality Date   BREAST REDUCTION SURGERY     Right   CATARACT EXTRACTION W/ INTRAOCULAR LENS  IMPLANT, BILATERAL     CESAREAN SECTION     COLONOSCOPY W/ BIOPSIES AND POLYPECTOMY     CYST EXCISION N/A 08/30/2018   Procedure: excision of posterior shoulder / back sebaceous cyst;  Surgeon: Wallace Going, DO;  Location: [facility];  Service: Plastics;  Laterality: N/A;   KYPHOPLASTY N/A 02/18/2016   Procedure: LUMBAR 1 KYPHOPLASTY;  Surgeon: Phylliss Bob, MD;  Location: [facility];  Service: Orthopedics;  Laterality: N/A;  LUMBAR 1 KYPHOPLASTY   MODIFIED RADICAL MASTECTOMY W/ AXILLARY LYMPH NODE DISSECTION     Left   RECONSTRUCTION BREAST W/ LATISSIMUS DORSI FLAP     left   REDUCTION MAMMAPLASTY Right    right foot surgery     corrected hammer toe, straightened big toe   SEPTOPLASTY     TOE SURGERY     TONSILLECTOMY AND ADENOIDECTOMY     Social History   Socioeconomic History   Marital status: Married    Spouse name: Not on file   Number of children: 1   Years of education: Not on file   Highest education level: Not on file   Occupational History   Occupation: retired  Tobacco Use   Smoking status: Never   Smokeless tobacco: Never  Vaping Use   Vaping Use: Never used  Substance and Sexual Activity   Alcohol use: Yes    Alcohol/week: 1.0 standard drink    Types: 1 Glasses of wine per week    Comment: wine in evenings but none since 04/2021   Drug use: No   Sexual activity: Not on file  Other Topics Concern   Not on file  Social History Narrative   Not on file   Social Determinants of Health   Financial Resource Strain: Not on file  Food Insecurity: Not on file  Transportation Needs: Not on file  Physical Activity: Not on file  Stress: Not on file  Social Connections: Not on file   Family History  Problem Relation Age of Onset   Lung cancer Mother    Hypertension Mother    Ovarian cancer Sister    Breast cancer Maternal Aunt    Brain cancer Maternal Aunt    Breast cancer Other        cousin   Ovarian cancer Other        cousin   Kidney cancer Other  cousin   Kidney cancer Paternal Uncle    Prostate cancer Paternal Uncle    Hypertension Father    Hypertension Brother    Allergies  Allergen Reactions   Nebivolol Hcl Other (See Comments)    Hypotensive episode   Bactrim [Sulfamethoxazole-Trimethoprim] Other (See Comments)    UNSPECIFIED    Dairy Aid [Tilactase] Diarrhea and Other (See Comments)    Ice cream, sour cream   Lactose Intolerance (Gi) Diarrhea   Microplegia Msa-Msg [Plegisol] Other (See Comments)    Unknown reaction    Adhesive [Tape] Rash   Meloxicam Nausea Only   Other Nausea And Vomiting    general anesthesia   Sulfa Antibiotics Rash        Prior to Admission medications   Medication Sig Start Date End Date Taking? Authorizing Provider  ADVAIR HFA 115-21 MCG/ACT inhaler USE 2 INHALATIONS TWICE A DAY 03/27/21  Yes Sood, Elisabeth Cara, MD  albuterol (VENTOLIN HFA) 108 (90 Base) MCG/ACT inhaler USE 2 INHALATIONS EVERY 6 HOURS AS NEEDED FOR WHEEZING OR SHORTNESS OF  BREATH 05/12/21  Yes Chesley Mires, MD  B Complex-C (B-COMPLEX WITH VITAMIN C) tablet Take 2 tablets by mouth in the morning.   Yes [provider]  Calcium Carb-Cholecalciferol (CALTRATE 600+D3 PO) Take 1 tablet by mouth in the morning and at bedtime.   Yes [provider]  clobetasol ointment (TEMOVATE) 8.09 % Apply 1 application topically 2 (two) times daily as needed (irritation).   Yes [provider]  denosumab (PROLIA) 60 MG/ML SOSY injection 60 mg every 6 (six) months.   Yes [provider]  diclofenac sodium (VOLTAREN) 1 % GEL 1 application 4 (four) times daily as needed (pain.). 04/10/14  Yes [provider]  diltiazem (CARDIZEM CD) 240 MG 24 hr capsule Take 240 mg by mouth in the morning.   Yes [provider]  diphenhydrAMINE (BENADRYL) 50 MG capsule Take 100 mg by mouth See admin instructions. Take 2 capsules (50 mg) by mouth up to 3 times daily   Yes [provider]  furosemide (LASIX) 80 MG tablet Take 1 tablet (80 mg total) by mouth daily. Can take additional dose if weigh increase >2 lbs in 2 days Patient taking differently: Take 40 mg by mouth daily. 12/23/20  Yes Cantwell, Celeste C, PA-C  hydrALAZINE (APRESOLINE) 50 MG tablet TAKE 1 TABLET THREE TIMES A DAY 03/10/21  Yes Cantwell, Celeste C, PA-C  HYDROcodone-acetaminophen (NORCO/VICODIN) 5-325 MG tablet Take 1 tablet by mouth in the morning, at noon, and at bedtime.   Yes [provider]  levothyroxine (SYNTHROID, LEVOTHROID) 88 MCG tablet Take 88 mcg by mouth daily.   Yes [provider]  losartan (COZAAR) 100 MG tablet Take 100 mg by mouth in the morning.   Yes [provider]  magnesium oxide (MAG-OX) 400 (240 Mg) MG tablet Take 400 mg by mouth daily as needed (regularity (constipation)).   Yes [provider]  MELATONIN PO Take 2 Doses by mouth at bedtime. ZzzQuil Pure Zzzs Melatonin Sleep Aid Gummies   Yes [provider]   methocarbamol (ROBAXIN) 500 MG tablet Take 500 mg by mouth in the morning, at noon, and at bedtime.   Yes [provider]  Multiple Vitamins-Minerals (MULTIVITAMIN WITH MINERALS) tablet Take 1 tablet by mouth daily at 12 noon. Centrum Silver for Women   Yes [provider]  naproxen sodium (ALEVE) 220 MG tablet Take 440 mg by mouth in the morning and at bedtime.   Yes  [provider]  ORTHOVISC 30 MG/2ML SOSY Inject 6 mLs into the skin every 6 (six) months. 11/10/20  Yes [provider]  Polyethyl Glycol-Propyl Glycol (SYSTANE) 0.4-0.3 % SOLN Place 1-2 drops into both eyes 3 (three) times daily as needed (dry/irritated eyes.).   Yes [provider]  Polyethyl Glycol-Propyl Glycol 0.4-0.3 % SOLN 1 drop as needed.   Yes [provider]  Polyethylene Glycol 400 (BLINK TEARS) 0.25 % GEL Place 1 drop into both eyes at bedtime.   Yes [provider]  potassium chloride SA (KLOR-CON M20) 20 MEQ tablet Take 3 tablets (60 mEq total) by mouth daily. Patient taking differently: Take 20 mEq by mouth 3 (three) times daily. 12/23/20 06/03/24 Yes Cantwell, Celeste C, PA-C  sertraline (ZOLOFT) 100 MG tablet Take 100 mg by mouth 2 (two) times daily.    Yes [provider]  albuterol (PROVENTIL) (2.5 MG/3ML) 0.083% nebulizer solution Take 3 mLs (2.5 mg dose) by nebulization every 6 (six) hours as needed for Wheezing. 10/09/19   Chesley Mires, MD     All other systems have been reviewed and were otherwise negative with the exception of those mentioned in the HPI and as above.  Physical Exam: Vitals:   06/04/21 0944  BP: (!) 162/66  Pulse: 62  Resp: 18  Temp: 97.6 F (36.4 C)  SpO2: 97%    Body mass index is 43.31 kg/m.  General: Alert, no acute distress Cardiovascular: No pedal edema Respiratory: No cyanosis, no use of accessory musculature Skin: No lesions in the area of chief complaint Neurologic: Sensation intact  distally Psychiatric: Patient is competent for consent with normal mood and affect Lymphatic: No axillary or cervical lymphadenopathy   Assessment/Plan: LUMBAR 2 COMPRESSION FRACTURE Plan for Procedure(s): LUMBAR 2 KYPHOPLASTY  Of note, I did extensively discussed with the patient that her pain may be coming from other etiologies as well, however, I did appear that the L2 compression fracture was likely her pain generator.  We did elect to proceed with a kyphoplasty procedure, with the understanding that if her pain does not alleviated substantially, additional more significant procedures may need to be entertained.   Norva Karvonen, MD 06/04/2021 12:33 PM

## 2021-06-04 NOTE — Op Note (Signed)
PATIENT NAME: [name]   MEDICAL RECORD NO.:   [id number]    DATE OF BIRTH: 03/17/1941   DATE OF PROCEDURE: 06/04/2021                               OPERATIVE REPORT   PREOPERATIVE DIAGNOSIS: Osteoporotic L2 compression fracture.  POSTOPERATIVE DIAGNOSIS:  Osteoporotic L2 compression fracture.  PROCEDURE:  L2 kyphoplasty.  SURGEON:  Phylliss Bob, MD.  ASSISTANTPricilla Holm, PA-C.  ANESTHESIA:  General endotracheal anesthesia.  COMPLICATIONS:  None.  DISPOSITION:  Stable.  ESTIMATED BLOOD LOSS:  Minimal.  INDICATIONS FOR SURGERY:  Briefly, [name] is a very pleasant 42- year-old female, who has been having severe pain in her back for approximately 1 month. This has been very disabling to her.  At the time of my last evaluation with her, I did express to her that there were multiple possible etiologies of her rather severe debilitating pain, one possibility which was the presence of an obvious L2 compression fracture.  We did elect to proceed with treatment for this particular injury, as this particular surgery is a minimally invasive surgery with very minimal risk, compared to more extensive surgeries with much more risk and recovery associated with them.  She did elect to proceed.  She did understand that if this particular surgery did not address her substantial miserable pain, additional interventions may need to be entertained.  She did wish to proceed.  OPERATIVE DETAILS:  On 06/04/2021, the patient was brought to surgery and general endotracheal anesthesia was administered.  The patient was placed prone on a well-padded flat Jackson bed with a spinal frame.  Antibiotics were given.  AP and lateral fluoroscopy was brought into the field.  The L2 pedicles were marked out in the usual fashion.  After a time-out procedure was performed, I did advance Jamshidis across the L2 pedicles on the right and left sides.  This was a meticulous portion of the procedure, as  the patient was noted to have very small pedicles, particularly on the right side.  I was ultimately able to gain access through the vertebral bodies with a Jamshidi's.  In then inserted kyphoplasty balloons and I was able to inflate the balloons with approximately 3cc of contrast.  At this point, after the cement was mixed, a total of approximately 7cc of cement was injected.  Most of the cement was injected through the left cannula, as introduction of cement into the left cannula did crossover to the right side.  I did attempt to introduce cement through the right cannula, although it was noted that a small portion of cement did migrate below the L2 pedicle on the right.  This was identified immediately, and at this point, I did immediately discontinue the introduction of any additional cement on the right. Excellent interdigitation of cement was identified bilaterally. The cement was then allowed to harden, after which point the Jamshidis were removed.  The wound  was then irrigated and closed using 4-0 Monocryl.  Bacitracin and a sterile dressing were applied.  The patient was then awokened from general endotracheal anesthesia and transferred to recovery in stable condition.   Phylliss Bob, MD

## 2021-06-04 NOTE — Anesthesia Postprocedure Evaluation (Signed)
Anesthesia Post Note  Patient: Lenoria A Olvera  Procedure(s) Performed: LUMBAR 2 KYPHOPLASTY     Patient location during evaluation: PACU Anesthesia Type: General Level of consciousness: awake and alert Pain management: pain level controlled Vital Signs Assessment: post-procedure vital signs reviewed and stable Respiratory status: spontaneous breathing, nonlabored ventilation, respiratory function stable and patient connected to nasal cannula oxygen Cardiovascular status: blood pressure returned to baseline and stable Postop Assessment: no apparent nausea or vomiting Anesthetic complications: no   No notable events documented.  Last Vitals:  Vitals:   06/04/21 1530 06/04/21 1545  BP: (!) 122/54 (!) 125/57  Pulse: 66 70  Resp: 15 18  Temp:    SpO2: 95% 95%    Last Pain:  Vitals:   06/04/21 1545  TempSrc:   PainSc: 2                  Catalina Gravel

## 2021-06-04 NOTE — Anesthesia Procedure Notes (Signed)
Procedure Name: Intubation Date/Time: 06/04/2021 1:35 PM Performed by: Michele Rockers, CRNA Pre-anesthesia Checklist: Patient identified, Patient being monitored, Timeout performed, Emergency Drugs available and Suction available Patient Re-evaluated:Patient Re-evaluated prior to induction Oxygen Delivery Method: Circle System Utilized Preoxygenation: Pre-oxygenation with 100% oxygen Induction Type: IV induction Ventilation: Mask ventilation without difficulty Laryngoscope Size: Mac and 3 Grade View: Grade II Tube type: Oral Tube size: 7.5 mm Number of attempts: 1 Airway Equipment and Method: Stylet Placement Confirmation: ETT inserted through vocal cords under direct vision, positive ETCO2 and breath sounds checked- equal and bilateral Secured at: 22 cm Tube secured with: Tape Dental Injury: Teeth and Oropharynx as per pre-operative assessment  Difficulty Due To: Difficult Airway- due to limited oral opening and Difficult Airway- due to anterior larynx Comments: 1st attempt with miller 2 unable to visualized VC, 2nd attempt with Mac 3 by Dr. Glennon Mac with success. Lots of soft tissues and limited oral opening.

## 2021-06-04 NOTE — Progress Notes (Signed)
Dr. Lynann Bologna made aware that patient was unable to give urine specimen for ordered urinalysis. Ok per Dr. Lynann Bologna.

## 2021-06-04 NOTE — Transfer of Care (Signed)
Immediate Anesthesia Transfer of Care Note  Patient: [name]  Procedure(s) Performed: LUMBAR 2 KYPHOPLASTY  Patient Location: PACU  Anesthesia Type:General  Level of Consciousness: awake  Airway & Oxygen Therapy: Patient Spontanous Breathing and Patient connected to face mask oxygen  Post-op Assessment: Report given to RN and Post -op Vital signs reviewed and stable  Post vital signs: Reviewed and stable  Last Vitals:  Vitals Value Taken Time  BP 145/66 06/04/21 1500  Temp    Pulse 64 06/04/21 1502  Resp 18 06/04/21 1502  SpO2 98 % 06/04/21 1502  Vitals shown include unvalidated device data.  Last Pain:  Vitals:   06/04/21 1020  TempSrc:   PainSc: 0-No pain         Complications: No notable events documented.

## 2021-06-05 ENCOUNTER — Encounter (HOSPITAL_COMMUNITY): Payer: Self-pay | Admitting: Orthopedic Surgery

## 2021-06-22 ENCOUNTER — Ambulatory Visit: Payer: Medicare HMO

## 2021-06-22 ENCOUNTER — Other Ambulatory Visit: Payer: Self-pay

## 2021-06-22 DIAGNOSIS — I10 Essential (primary) hypertension: Secondary | ICD-10-CM

## 2021-06-22 DIAGNOSIS — I5032 Chronic diastolic (congestive) heart failure: Secondary | ICD-10-CM

## 2021-07-01 ENCOUNTER — Other Ambulatory Visit: Payer: Self-pay

## 2021-07-01 ENCOUNTER — Encounter: Payer: Self-pay | Admitting: Student

## 2021-07-01 ENCOUNTER — Ambulatory Visit: Payer: Medicare HMO | Admitting: Student

## 2021-07-01 VITALS — BP 144/62 | HR 64 | Temp 98.2°F | Ht 61.5 in | Wt 238.6 lb

## 2021-07-01 DIAGNOSIS — I10 Essential (primary) hypertension: Secondary | ICD-10-CM

## 2021-07-01 DIAGNOSIS — I5032 Chronic diastolic (congestive) heart failure: Secondary | ICD-10-CM

## 2021-07-01 NOTE — Progress Notes (Signed)
Primary Physician/Referring:  [name]  Patient ID: [name], female    DOB: 1940-12-03, 80 y.o.   MRN: [id number]  Chief Complaint  Patient presents with   Congestive Heart Failure   Hypertension   Follow-up   Results   HPI:    [name]  is a 80 y.o. Caucasian female  with hypothyroidism, hypertension, depression/anxiety, OSA on CPAP, history of left breast cancer, hypocalcemia, CKD stage 3. She did not tolerate Bisoprolol,  labetalol due to worsening asthma and Aldactone caused worsening renal function.   Patient presents for 20-monthfollow-up of chronic diastolic heart failure and hypertension.  Last office visit increase Lasix to 80 mg once daily, however renal function suffered, she was therefore advised to cut Lasix back to 40 mg daily.  Patient also underwent repeat echocardiogram 06/22/21, which is unchanged compared to previous and 12/2019.  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  Due to pain from her back and hip patient is now in a wheelchair.  She is presently in significant pain as she has been sitting for extended period of time.  She denies chest pain, palpitations, syncope, near syncope.  Denies orthopnea, PND, leg edema.  Notably she is lost approximately 21 pounds since August, likely combination of eating less and reducing volume overload.  Patient has made diet changes and is following low-sodium diet.  Past Medical History:  Diagnosis Date   Anemia    PMH;  Patient denies this dx as of 06/03/21   Anxiety    Asthma    Breast cancer ([facility] 1988   left breast cancer   Collagenous colitis    Compression fracture    lumbar 1   Dyspnea    with exertion   Hypertension    Hypothyroidism    Osteoarthritis    Osteopenia    Pneumonia 07/2020   PONV (postoperative nausea and vomiting)    Sleep apnea    wears CPAP  nightly   Past Surgical History:  Procedure Laterality Date   BREAST REDUCTION SURGERY     Right   CATARACT EXTRACTION W/ INTRAOCULAR LENS  IMPLANT, BILATERAL     CESAREAN SECTION     COLONOSCOPY W/ BIOPSIES AND POLYPECTOMY     CYST EXCISION N/A 08/30/2018   Procedure: excision of posterior shoulder / back sebaceous cyst;  Surgeon: DWallace Going DO;  [place] [facility]  Service: [place];  Laterality: N/A;   KYPHOPLASTY N/A 02/18/2016   Procedure: LUMBAR 1 KYPHOPLASTY;  Surgeon: MPhylliss Bob MD;  [place] [facility]  Service: Orthopedics;  Laterality: N/A;  LUMBAR 1 KYPHOPLASTY   KYPHOPLASTY N/A 06/04/2021   Procedure: LUMBAR 2 KYPHOPLASTY;  Surgeon: DPhylliss Bob MD;  [place] [facility]  Service: Orthopedics;  Laterality: N/A;   MODIFIED RADICAL MASTECTOMY W/ AXILLARY LYMPH NODE DISSECTION     Left   RECONSTRUCTION BREAST W/ LATISSIMUS DORSI FLAP     left   REDUCTION MAMMAPLASTY Right    right foot surgery     corrected hammer toe, straightened big toe   SEPTOPLASTY     TOE SURGERY     TONSILLECTOMY AND ADENOIDECTOMY     Family History  Problem Relation Age of Onset   Lung cancer Mother    Hypertension Mother    Ovarian cancer Sister    Breast cancer Maternal Aunt  Brain cancer Maternal Aunt    Breast cancer Other        cousin   Ovarian cancer Other        cousin   Kidney cancer Other        cousin   Kidney cancer Paternal Uncle    Prostate cancer Paternal Uncle    Hypertension Father    Hypertension Brother     Social History   Tobacco Use   Smoking status: Never   Smokeless tobacco: Never  Substance Use Topics   Alcohol use: Yes    Alcohol/week: 1.0 standard drink    Types: 1 Glasses of wine per week    Comment: wine in evenings but none since 04/2021   Marital Status: Married  ROS  Review of Systems  Constitutional: Negative for malaise/fatigue and weight gain.  Cardiovascular:  Positive for dyspnea on exertion (improving).  Negative for chest pain, claudication, leg swelling (resolved), near-syncope, orthopnea, palpitations, paroxysmal nocturnal dyspnea and syncope.  Respiratory:  Negative for wheezing.   Hematologic/Lymphatic: Does not bruise/bleed easily.  Musculoskeletal:  Positive for back pain and joint pain.  Gastrointestinal:  Negative for melena.  Neurological:  Negative for dizziness and weakness.   Objective  Blood pressure (!) 144/62, pulse 64, temperature 98.2 F (36.8 C), temperature source Temporal, height 5' 1.5" (1.562 m), weight 238 lb 9.6 oz (108.2 kg), SpO2 95 %.  Vitals with BMI 07/01/2021 06/04/2021 06/04/2021  Height 5' 1.5" - -  Weight 238 lbs 10 oz - -  BMI 52.84 - -  Systolic 132 440 102  Diastolic 62 57 54  Pulse 64 70 66     Physical Exam Vitals reviewed.  Constitutional:      General: She is not in acute distress.    Appearance: She is well-developed. She is obese. She is not ill-appearing.  Cardiovascular:     Rate and Rhythm: Normal rate and regular rhythm.     Pulses: Normal pulses and intact distal pulses.          Carotid pulses are 2+ on the right side and 2+ on the left side.      Dorsalis pedis pulses are 2+ on the right side and 2+ on the left side.       Posterior tibial pulses are 2+ on the right side and 2+ on the left side.     Heart sounds: S1 normal and S2 normal. No murmur heard.   No gallop.     Comments: No JVD. Pulmonary:     Effort: Pulmonary effort is normal. No accessory muscle usage or respiratory distress.     Breath sounds: No decreased breath sounds, wheezing, rhonchi or rales.  Musculoskeletal:     Right lower leg: No edema.     Left lower leg: No edema.  Neurological:     Mental Status: She is alert.   Laboratory examination:   Recent Labs    09/02/20 1528 12/30/20 1533 06/04/21 1035  NA 145* 143 137  K 3.4* 3.7 3.7  CL 104 104 104  CO2 _0 GLUCOSE 100* 109* 100*  BUN _1 CREATININE 0.88 1.04* 0.80  CALCIUM 9.8 10.4*  9.0  GFRNONAA  --   --  >60   CrCl cannot be calculated (Patient's most recent lab result is older than the maximum 21 days allowed.).  CMP Latest Ref Rng & Units 06/04/2021 12/30/2020 09/02/2020  Glucose 70 - 99 mg/dL 100(H) 109(H) 100(H)  BUN 8 -  23 mg/dL _0 Creatinine 0.44 - 1.00 mg/dL 0.80 1.04(H) 0.88  Sodium 135 - 145 mmol/L 137 143 145(H)  Potassium 3.5 - 5.1 mmol/L 3.7 3.7 3.4(L)  Chloride 98 - 111 mmol/L 104 104 104  CO2 22 - 32 mmol/L _1 Calcium 8.9 - 10.3 mg/dL 9.0 10.4(H) 9.8  Total Protein 6.5 - 8.1 g/dL 6.1(L) - -  Total Bilirubin 0.3 - 1.2 mg/dL 0.9 - -  Alkaline Phos 38 - 126 U/L 81 - -  AST 15 - 41 U/L 23 - -  ALT 0 - 44 U/L 24 - -   CBC Latest Ref Rng & Units 06/04/2021 09/02/2020 02/18/2016  WBC 4.0 - 10.5 K/uL 4.9 7.8 5.9  Hemoglobin 12.0 - 15.0 g/dL 13.1 12.2 13.8  Hematocrit 36.0 - 46.0 % 39.7 36.0 42.5  Platelets 150 - 400 K/uL 179 185 168   Lipid Panel  No results found for: CHOL, TRIG, HDL, CHOLHDL, VLDL, LDLCALC, LDLDIRECT HEMOGLOBIN A1C No results found for: HGBA1C, MPG TSH No results for input(s): TSH in the last 8760 hours.  BNP (last 3 results) Recent Labs    09/02/20 1528  BNP 53.1    External labs: Labs 04/01/2020:  A1c 5.1%.  TSH normal.  Hb 12.6/HCT 38.4, mild macrocytosis present, platelets 172.  Serum glucose 94 mg, BUN 17, creatinine 0.86, EGFR 66 mL, sodium 142, potassium 3.6, CMP otherwise normal.  Total cholesterol 217, triglycerides 68, HDL 118, LDL 87. 03/21/2019: BUN 22 creatinine 0.97, EGFR 57 mL, sodium 144, potassium 3.9. Total cholesterol 185, triglycerides 126, HDL 87, LDL 77.  Allergies   Allergies  Allergen Reactions   Nebivolol Hcl Other (See Comments)    Hypotensive episode   Bactrim [Sulfamethoxazole-Trimethoprim] Other (See Comments)    UNSPECIFIED    Dairy Aid [Tilactase] Diarrhea and Other (See Comments)    Ice cream, sour cream   Lactose Intolerance (Gi) Diarrhea   Microplegia Msa-Msg  [Plegisol] Other (See Comments)    Unknown reaction    Adhesive [Tape] Rash   Meloxicam Nausea Only   Other Nausea And Vomiting    general anesthesia   Sulfa Antibiotics Rash         Medications Prior to Visit:   Outpatient Medications Prior to Visit  Medication Sig Dispense Refill   ADVAIR HFA 115-21 MCG/ACT inhaler USE 2 INHALATIONS TWICE A DAY 36 g 3   albuterol (PROVENTIL) (2.5 MG/3ML) 0.083% nebulizer solution Take 3 mLs (2.5 mg dose) by nebulization every 6 (six) hours as needed for Wheezing. 360 mL 0   albuterol (VENTOLIN HFA) 108 (90 Base) MCG/ACT inhaler USE 2 INHALATIONS EVERY 6 HOURS AS NEEDED FOR WHEEZING OR SHORTNESS OF BREATH 51 g 3   B Complex-C (B-COMPLEX WITH VITAMIN C) tablet Take 2 tablets by mouth in the morning.     Calcium Carb-Cholecalciferol (CALTRATE 600+D3 PO) Take 1 tablet by mouth in the morning and at bedtime.     clobetasol ointment (TEMOVATE) 4.48 % Apply 1 application topically 2 (two) times daily as needed (irritation).     denosumab (PROLIA) 60 MG/ML SOSY injection 60 mg every 6 (six) months.     diclofenac sodium (VOLTAREN) 1 % GEL 1 application 4 (four) times daily as needed (pain.).     diltiazem (CARDIZEM CD) 240 MG 24 hr capsule Take 240 mg by mouth in the morning.     diphenhydrAMINE (BENADRYL) 50 MG capsule Take 100 mg by mouth See admin instructions. Take 2 capsules (50  mg) by mouth up to 3 times daily     furosemide (LASIX) 80 MG tablet Take 1 tablet (80 mg total) by mouth daily. Can take additional dose if weigh increase >2 lbs in 2 days (Patient taking differently: Take 40 mg by mouth daily.) 90 tablet 3   hydrALAZINE (APRESOLINE) 50 MG tablet TAKE 1 TABLET THREE TIMES A DAY 270 tablet 3   levothyroxine (SYNTHROID, LEVOTHROID) 88 MCG tablet Take 88 mcg by mouth daily.     losartan (COZAAR) 100 MG tablet Take 100 mg by mouth in the morning.     magnesium oxide (MAG-OX) 400 (240 Mg) MG tablet Take 400 mg by mouth daily as needed (regularity  (constipation)).     MELATONIN PO Take 2 Doses by mouth at bedtime. ZzzQuil Pure Zzzs Melatonin Sleep Aid Gummies     methocarbamol (ROBAXIN) 500 MG tablet Take 500 mg by mouth in the morning, at noon, and at bedtime.     Multiple Vitamins-Minerals (MULTIVITAMIN WITH MINERALS) tablet Take 1 tablet by mouth daily at 12 noon. Centrum Silver for Women     ORTHOVISC 30 MG/2ML SOSY Inject 6 mLs into the skin every 6 (six) months.     Polyethyl Glycol-Propyl Glycol (SYSTANE) 0.4-0.3 % SOLN Place 1-2 drops into both eyes 3 (three) times daily as needed (dry/irritated eyes.).     Polyethyl Glycol-Propyl Glycol 0.4-0.3 % SOLN 1 drop as needed.     Polyethylene Glycol 400 (BLINK TEARS) 0.25 % GEL Place 1 drop into both eyes at bedtime.     potassium chloride SA (KLOR-CON M20) 20 MEQ tablet Take 3 tablets (60 mEq total) by mouth daily. (Patient taking differently: Take 20 mEq by mouth 3 (three) times daily.) 270 tablet 3   sertraline (ZOLOFT) 100 MG tablet Take 100 mg by mouth 2 (two) times daily.      HYDROcodone-acetaminophen (NORCO/VICODIN) 5-325 MG tablet Take 1 tablet by mouth in the morning, at noon, and at bedtime.     HYDROcodone-acetaminophen (NORCO/VICODIN) 5-325 MG tablet Take 1 tablet by mouth every 6 (six) hours as needed for moderate pain or severe pain. 20 tablet 0   methocarbamol (ROBAXIN) 500 MG tablet Take 1 tablet (500 mg total) by mouth every 6 (six) hours as needed for muscle spasms. 60 tablet 0   No facility-administered medications prior to visit.   Final Medications at End of Visit    Current Meds  Medication Sig   ADVAIR HFA 115-21 MCG/ACT inhaler USE 2 INHALATIONS TWICE A DAY   albuterol (PROVENTIL) (2.5 MG/3ML) 0.083% nebulizer solution Take 3 mLs (2.5 mg dose) by nebulization every 6 (six) hours as needed for Wheezing.   albuterol (VENTOLIN HFA) 108 (90 Base) MCG/ACT inhaler USE 2 INHALATIONS EVERY 6 HOURS AS NEEDED FOR WHEEZING OR SHORTNESS OF BREATH   B Complex-C (B-COMPLEX  WITH VITAMIN C) tablet Take 2 tablets by mouth in the morning.   Calcium Carb-Cholecalciferol (CALTRATE 600+D3 PO) Take 1 tablet by mouth in the morning and at bedtime.   clobetasol ointment (TEMOVATE) 7.26 % Apply 1 application topically 2 (two) times daily as needed (irritation).   denosumab (PROLIA) 60 MG/ML SOSY injection 60 mg every 6 (six) months.   diclofenac sodium (VOLTAREN) 1 % GEL 1 application 4 (four) times daily as needed (pain.).   diltiazem (CARDIZEM CD) 240 MG 24 hr capsule Take 240 mg by mouth in the morning.   diphenhydrAMINE (BENADRYL) 50 MG capsule Take 100 mg by mouth See admin instructions. Take 2  capsules (50 mg) by mouth up to 3 times daily   furosemide (LASIX) 80 MG tablet Take 1 tablet (80 mg total) by mouth daily. Can take additional dose if weigh increase >2 lbs in 2 days (Patient taking differently: Take 40 mg by mouth daily.)   hydrALAZINE (APRESOLINE) 50 MG tablet TAKE 1 TABLET THREE TIMES A DAY   levothyroxine (SYNTHROID, LEVOTHROID) 88 MCG tablet Take 88 mcg by mouth daily.   losartan (COZAAR) 100 MG tablet Take 100 mg by mouth in the morning.   magnesium oxide (MAG-OX) 400 (240 Mg) MG tablet Take 400 mg by mouth daily as needed (regularity (constipation)).   MELATONIN PO Take 2 Doses by mouth at bedtime. ZzzQuil Pure Zzzs Melatonin Sleep Aid Gummies   methocarbamol (ROBAXIN) 500 MG tablet Take 500 mg by mouth in the morning, at noon, and at bedtime.   Multiple Vitamins-Minerals (MULTIVITAMIN WITH MINERALS) tablet Take 1 tablet by mouth daily at 12 noon. Centrum Silver for Women   ORTHOVISC 30 MG/2ML SOSY Inject 6 mLs into the skin every 6 (six) months.   Polyethyl Glycol-Propyl Glycol (SYSTANE) 0.4-0.3 % SOLN Place 1-2 drops into both eyes 3 (three) times daily as needed (dry/irritated eyes.).   Polyethyl Glycol-Propyl Glycol 0.4-0.3 % SOLN 1 drop as needed.   Polyethylene Glycol 400 (BLINK TEARS) 0.25 % GEL Place 1 drop into both eyes at bedtime.   potassium  chloride SA (KLOR-CON M20) 20 MEQ tablet Take 3 tablets (60 mEq total) by mouth daily. (Patient taking differently: Take 20 mEq by mouth 3 (three) times daily.)   sertraline (ZOLOFT) 100 MG tablet Take 100 mg by mouth 2 (two) times daily.    [DISCONTINUED] HYDROcodone-acetaminophen (NORCO/VICODIN) 5-325 MG tablet Take 1 tablet by mouth in the morning, at noon, and at bedtime.    Medications Discontinued During This Encounter  Medication Reason   HYDROcodone-acetaminophen (NORCO/VICODIN) 5-325 MG tablet    HYDROcodone-acetaminophen (NORCO/VICODIN) 5-325 MG tablet    methocarbamol (ROBAXIN) 500 MG tablet    Radiology:   No results found.  Chest x-ray 09/02/2020: Mild cardiomegaly without overt pulmonary edema. Patchy atelectasis at the bases. Trace right pleural effusion or thickening. No pneumothorax. Old right-sided rib deformities. Treated compression deformity at the thoracolumbar junction.   IMPRESSION: Mild cardiomegaly without overt edema. Patchy atelectasis at the bases with trace right pleural effusion or thickening.  Cardiac Studies:    Lexiscan Myoview stress test 01/17/2019: Lexiscan stress test was performed. Stress EKG is non-diagnostic, as this is pharmacological stress test. Pharmacological myocardial perfusion imaging is normal. LVEF 69%. Low risk study.   Echocardiogram 06/22/2021:    Normal LV systolic function with EF 57%. Left ventricle cavity is normal in size. Moderate concentric remodeling of the left ventricle. Normal global wall motion. Doppler evidence of grade I (impaired) diastolic dysfunction, elevated LAP. E/e' 15.1 with E/A 0.8 and DT 232. Calculated EF 57%.  Left atrial cavity is mildly dilated.  Trace mitral regurgitation. Mild calcification of the mitral valve annulus. No evidence of mitral valve stenosis.  No significant change from 12/07/2019.    EKG:  07/01/2021: Sinus rhythm at a rate of 63 bpm.  Left axis.  Left anterior fascicular block.   Poor refreshing, cannot exclude anteroseptal infarct old.  Low voltage complexes, consider pulmonary disease pattern.  07/14/2020: Sinus rhythm at the rate of 63 bpm with borderline first-degree AV block, no evidence of ischemia, normal EKG otherwise.    10/17/2019: Marked sinus bradycardia at rate of 54 bpm with first-degree  AV block, normal axis, no evidence of ischemia.  Abnormal EKG. COMPARED TO EKG 11/24/2018: Sinus rhythm/sinus arrhythmia at 64 bpm.  Assessment     ICD-10-CM   1. Chronic diastolic heart failure (HCC)  I50.32 EKG 12-Lead    2. Essential hypertension  I10       No orders of the defined types were placed in this encounter.  Medications Discontinued During This Encounter  Medication Reason   HYDROcodone-acetaminophen (NORCO/VICODIN) 5-325 MG tablet    HYDROcodone-acetaminophen (NORCO/VICODIN) 5-325 MG tablet    methocarbamol (ROBAXIN) 500 MG tablet     Orders Placed This Encounter  Procedures   EKG 12-Lead     Recommendations:   [name]  is a  80 y.o.  female  with hypothyroidism, hypertension, depression/anxiety, OSA on CPAP, history of left breast cancer, hypocalcemia, CKD stage 3, chronic diastolic heart failure.   Patient is presently enrolled in our PPM and PCM for heart failure management with our clinic.    Patient presents for 52-monthfollow-up of chronic diastolic heart failure and hypertension.  Last office visit increase Lasix to 80 mg once daily, however renal function suffered, she was therefore advised to cut Lasix back to 60 mg daily.  Patient also underwent repeat echocardiogram 06/22/21, which is unchanged compared to previous and 12/2019.  Patient's blood pressure is elevated in the office today, likely secondary to pain.  She states it is relatively well controlled at home.  Encourage patient to continue to monitor blood pressure on a regular basis and notify our office if it is >130/80 mmHg.  Patient is congratulated on weight loss and  encouraged to continue to focus on reducing sodium and calorie intake.  She is euvolemic on exam without clinical evidence of heart failure.  We will continue Lasix 40 mg daily.  EKG remains unchanged.  Patient is otherwise stable from a cardiovascular standpoint.Follow-up in 3 months, sooner if needed for blood pressure management and HFpEF.   CAlethia Berthold PA-C 07/01/2021, 4:07 PM Office: [phone]

## 2021-08-17 ENCOUNTER — Other Ambulatory Visit: Payer: Self-pay | Admitting: Orthopaedic Surgery

## 2021-08-17 DIAGNOSIS — M25512 Pain in left shoulder: Secondary | ICD-10-CM

## 2021-09-06 ENCOUNTER — Ambulatory Visit
Admission: RE | Admit: 2021-09-06 | Discharge: 2021-09-06 | Disposition: A | Discharge status: Home / Self Care | Payer: Medicare HMO | Source: Ambulatory Visit | Attending: Orthopaedic Surgery | Admitting: Orthopaedic Surgery

## 2021-09-06 ENCOUNTER — Other Ambulatory Visit: Payer: Self-pay

## 2021-09-06 DIAGNOSIS — M25512 Pain in left shoulder: Secondary | ICD-10-CM

## 2021-09-19 ENCOUNTER — Encounter: Payer: Self-pay | Admitting: Rehabilitative and Restorative Service Providers"

## 2021-09-19 ENCOUNTER — Ambulatory Visit: Payer: Medicare HMO | Attending: Orthopaedic Surgery | Admitting: Rehabilitative and Restorative Service Providers"

## 2021-09-19 ENCOUNTER — Other Ambulatory Visit: Payer: Self-pay

## 2021-09-19 DIAGNOSIS — M6281 Muscle weakness (generalized): Secondary | ICD-10-CM | POA: Diagnosis present

## 2021-09-19 DIAGNOSIS — M25512 Pain in left shoulder: Secondary | ICD-10-CM | POA: Diagnosis not present

## 2021-09-19 DIAGNOSIS — G8929 Other chronic pain: Secondary | ICD-10-CM | POA: Diagnosis present

## 2021-09-19 NOTE — Therapy (Signed)
?OUTPATIENT PHYSICAL THERAPY SHOULDER EVALUATION ? ? ?Patient Name: [name] ?MRN: [id number] ?DOB:[birth year], 81 y.o., female ?Today's Date: 09/19/2021 ? ? PT End of Session - 09/19/21 1230   ? ? Visit Number 1   ? Number of Visits 12   ? Date for PT Re-Evaluation 10/31/21   ? Authorization Type Aetna MCR   ? Progress Note Due on Visit 10   ? PT Start Time 1132   ? PT Stop Time 1227   ? PT Time Calculation (min) 55 min   ? Activity Tolerance Patient tolerated treatment well;No increased pain   ? Behavior During Therapy [facility] for tasks assessed/performed   ? ?  ?  ? ?  ? ? ?Past Medical History:  ?Diagnosis Date  ? Anemia   ? PMH;  Patient denies this dx as of 06/03/21  ? Anxiety   ? Asthma   ? Breast cancer ([place]) 1988  ? left breast cancer  ? Collagenous colitis   ? Compression fracture   ? lumbar 1  ? Dyspnea   ? with exertion  ? Hypertension   ? Hypothyroidism   ? Osteoarthritis   ? Osteopenia   ? Pneumonia 07/2020  ? PONV (postoperative nausea and vomiting)   ? Sleep apnea   ? wears CPAP nightly  ? ?Past Surgical History:  ?Procedure Laterality Date  ? BREAST REDUCTION SURGERY    ? Right  ? CATARACT EXTRACTION W/ INTRAOCULAR LENS  IMPLANT, BILATERAL    ? CESAREAN SECTION    ? COLONOSCOPY W/ BIOPSIES AND POLYPECTOMY    ? CYST EXCISION N/A 08/30/2018  ? Procedure: excision of posterior shoulder / back sebaceous cyst;  Surgeon: Wallace Going, DO;  Location: [place];  Service: Plastics;  Laterality: N/A;  ? KYPHOPLASTY N/A 02/18/2016  ? Procedure: LUMBAR 1 KYPHOPLASTY;  Surgeon: Phylliss Bob, MD;  Location: [place];  Service: Orthopedics;  Laterality: N/A;  LUMBAR 1 KYPHOPLASTY  ? KYPHOPLASTY N/A 06/04/2021  ? Procedure: LUMBAR 2 KYPHOPLASTY;  Surgeon: Phylliss Bob, MD;  Location: [place];  Service: Orthopedics;  Laterality: N/A;  ? MODIFIED RADICAL MASTECTOMY W/ AXILLARY LYMPH NODE DISSECTION    ? Left  ? RECONSTRUCTION BREAST W/ LATISSIMUS DORSI FLAP    ? left  ? REDUCTION MAMMAPLASTY  Right   ? right foot surgery    ? corrected hammer toe, straightened big toe  ? SEPTOPLASTY    ? TOE SURGERY    ? TONSILLECTOMY AND ADENOIDECTOMY    ? ?Patient Active Problem List  ? Diagnosis Date Noted  ? CAP (community acquired pneumonia) 09/30/2020  ? Chronic respiratory failure with hypoxia ([place]) 09/23/2020  ? Allergic rhinitis 08/31/2019  ? Pilar cyst 08/04/2018  ? History of breast cancer in female 06/09/2018  ? History of reconstruction of both breasts 06/09/2018  ? Severe obesity (BMI >= 40) ([place]) 09/16/2015  ? Asthma with acute exacerbation 09/15/2015  ? DOE (dyspnea on exertion) 01/11/2014  ? Insomnia 05/05/2012  ? Osteoarthritis   ? Osteopenia   ? Collagenous colitis   ? OSA (obstructive sleep apnea)   ? Hypothyroidism   ? Mild persistent asthma   ? Hypertension   ? Breast cancer, left breast ([place])   ? ? ?PCP: Aura Dials, PA-C ? ?REFERRING PROVIDER: Melrose Nakayama, MD ? ?REFERRING DIAG: L chronic RCT ? ?THERAPY DIAG:  ?Chronic left shoulder pain ? ?Muscle weakness (generalized) ? ? ?ONSET DATE: 6 years ago ? ?SUBJECTIVE:                                                                                                                                                                                     ? ?  SUBJECTIVE STATEMENT: ?Drs thinking was to do the therapy first and see if it helps; if not, I am a candidate for reverse shoulder replacement. However, I cannot do the surgery now due to husband's schedule. Therapy before worked. ? ?PERTINENT HISTORY: ?About 6 years ago, pt was sitting on a footstool and went to pick up a toy that had fallen to the floor and grandson began to slide out of a chair and she threw her arm up to prevent him from falling.  She heard a pop. She had therapy before that assisted.In the past couple of months, pain began again insidiously. Pt has a history of hip pain of which she has a 6 month old prescription for as well; wants to concentrate on L shoulder first. She reports  having a transport w/c to assist with cooking.  ? ?PAIN:  ?Are you having pain?  No but wakes pt up at night; keeps voltaren gel near bed at night ? ?PRECAUTIONS: None ? ?WEIGHT BEARING RESTRICTIONS No ? ?FALLS:  ?Has patient fallen in last 6 months? No Number of falls: 0 ? ?LIVING ENVIRONMENT: ?Lives with: lives with their family and lives with their spouse ?Lives in: House/apartment ?Stairs: No;  ? ? 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 ?PLOF: Independent with basic ADLs ? ?PATIENT GOALS to be able to use my arm with decreased pain.  ? ?OBJECTIVE:  ? ?DIAGNOSTIC FINDINGS:  ?MRI 09/06/21 IMPRESSION: ?1. Moderate tendinosis of the supraspinatus tendon with a 10 mm ?full-thickness tear and 11 mm of retraction. ?2. Moderate tendinosis of the infraspinatus tendon. ?3. Moderate tendinosis of the subscapularis tendon. ?4. Mild tendinosis of the intra-articular portion of the long head ?of the biceps tendon. ?5. Severe osteoarthritis of the left glenohumeral joint. ? ?PATIENT SURVEYS:  ?FOTO : needs to be done ? ?COGNITION: ? Overall cognitive status: Within functional limits for tasks assessed ?    ?SENSATION: ?WFL ? ?POSTURE: ?Dowers hump, rounded shoulders bil, forward head.  ? ?UPPER EXTREMITY ROM:  ? ?Active ROM-measured sitting Right ?09/19/2021 Left ?09/19/2021  ?Shoulder flexion WFL 81 degrees  ?Shoulder extension    ?Shoulder abduction WFL 40 deg with shoulder hike  ?Shoulder adduction    ?Shoulder internal rotation L4/5 L glute med   ?Shoulder external rotation T2 To L ear  ?Elbow flexion    ?Elbow extension    ?Wrist flexion    ?Wrist extension    ?Wrist ulnar deviation    ?Wrist radial deviation    ?Wrist pronation    ?Wrist supination    ?(Blank rows = not tested) ? ?PROM: L shoulder supine: flexion to 120, abduction to 140, IR to approx 40, ER to approx 60 ? ?UPPER EXTREMITY MMT: ? ?MMT Right ?09/19/2021 Left ?09/19/2021  ?Shoulder flexion 3+/5 2-/5  ?Shoulder extension     ?Shoulder abduction 3-/5 2-/5  ?Shoulder adduction    ?Shoulder internal rotation 3+/5 2-/5  ?Shoulder external rotation 3+/5 2-/5  ?Middle trapezius    ?Lower trapezius    ?Elbow flexion 4+/5 3+/5  ?Elbow extension 4+/5 3/5  ?Wrist flexion    ?Wrist extension    ?Wrist ulnar deviation    ?Wrist radial deviation    ?Wrist pronation    ?Wrist supination    ?Grip strength (lbs)    ?(Blank rows = not tested) ? ?Cervical AROM limited 25% all planes  ? ?SHOULDER SPECIAL TESTS: ? Impingement tests: Karges/Kennedy impingement test: positive  ?Rotator cuff assessment: Drop arm test:  negative, Empty can test: positive , Full can test: negative, and see diagnostic section ? ? ?PALPATION:  ?L supraspinatus groove can be easily palpated compared to contralateral side supine; scapulae equal ?  ?TODAY'S TREATMENT:  ?Reviewed PT POC ? ? ?PATIENT EDUCATION: ?Education details: reviewed PT POC, explained what tendonitis is ?Person educated: Patient ?Education method: Explanation ?Education comprehension: verbalized understanding ? ? ?HOME EXERCISE PROGRAM: ?Not issued at eval due to time constraints; pt late ? ?ASSESSMENT: ? ?CLINICAL IMPRESSION: ?Patient is a 81 y.o. female who was seen today for physical therapy evaluation and treatment for L shoulder pain. She demonstrates decreased L shoulder AROM, decreased L shoulder strength, and poor posture.  She has tendonosis of rotator cuff musculature along with Moderate tendinosis of the supraspinatus tendon with a 10 mm full-thickness tear and 11 mm of retraction. Pt is R handed. Pt would benefit from PT to address pain management and deficits in ROM and strengthening.  ? ? ?OBJECTIVE IMPAIRMENTS decreased activity tolerance, decreased mobility, decreased ROM, decreased strength, impaired UE functional use, postural dysfunction, and pain.  ? ?ACTIVITY LIMITATIONS cleaning, meal prep, shopping, and using L UE for fxnal activities .  ? ?PERSONAL FACTORS 3+ comorbidities: L breast  cancer history, osteopenia, asthma, rotator cuff complex impairments  are also affecting patient's functional outcome.  ? ? ?REHAB POTENTIAL: Good ? ?CLINICAL DECISION MAKING: Stable/uncomplicated ? ?EVALUATION C

## 2021-09-24 ENCOUNTER — Ambulatory Visit: Payer: Medicare HMO

## 2021-09-24 ENCOUNTER — Other Ambulatory Visit: Payer: Self-pay

## 2021-09-24 DIAGNOSIS — M6281 Muscle weakness (generalized): Secondary | ICD-10-CM

## 2021-09-24 DIAGNOSIS — G8929 Other chronic pain: Secondary | ICD-10-CM

## 2021-09-24 DIAGNOSIS — M25512 Pain in left shoulder: Secondary | ICD-10-CM | POA: Diagnosis not present

## 2021-09-24 NOTE — Therapy (Signed)
?OUTPATIENT PHYSICAL THERAPY TREATMENT NOTE ? ? ?Patient Name: [name] ?MRN: [id number] ?DOB:02/07/1941, 81 y.o., female ?Today's Date: 09/24/2021 ? ?PCP: Aura Dials, PA-C ?REFERRING PROVIDER: Aura Dials, PA-C ? ? PT End of Session - 09/24/21 1413   ? ? Visit Number 2   ? Number of Visits 12   ? Date for PT Re-Evaluation 10/31/21   ? Authorization Type Aetna MCR   ? Progress Note Due on Visit 10   ? PT Start Time 1415   ? PT Stop Time 1500   ? PT Time Calculation (min) 45 min   ? Activity Tolerance Patient tolerated treatment well;No increased pain   ? Behavior During Therapy [facility] for tasks assessed/performed   ? ?  ?  ? ?  ? ? ?Past Medical History:  ?Diagnosis Date  ? Anemia   ? PMH;  Patient denies this dx as of 06/03/21  ? Anxiety   ? Asthma   ? Breast cancer ([place]) 1988  ? left breast cancer  ? Collagenous colitis   ? Compression fracture   ? lumbar 1  ? Dyspnea   ? with exertion  ? Hypertension   ? Hypothyroidism   ? Osteoarthritis   ? Osteopenia   ? Pneumonia 07/2020  ? PONV (postoperative nausea and vomiting)   ? Sleep apnea   ? wears CPAP nightly  ? ?Past Surgical History:  ?Procedure Laterality Date  ? BREAST REDUCTION SURGERY    ? Right  ? CATARACT EXTRACTION W/ INTRAOCULAR LENS  IMPLANT, BILATERAL    ? CESAREAN SECTION    ? COLONOSCOPY W/ BIOPSIES AND POLYPECTOMY    ? CYST EXCISION N/A 08/30/2018  ? Procedure: excision of posterior shoulder / back sebaceous cyst;  Surgeon: Wallace Going, DO;  Location: [place];  Service: Plastics;  Laterality: N/A;  ? KYPHOPLASTY N/A 02/18/2016  ? Procedure: LUMBAR 1 KYPHOPLASTY;  Surgeon: Phylliss Bob, MD;  Location: [place];  Service: Orthopedics;  Laterality: N/A;  LUMBAR 1 KYPHOPLASTY  ? KYPHOPLASTY N/A 06/04/2021  ? Procedure: LUMBAR 2 KYPHOPLASTY;  Surgeon: Phylliss Bob, MD;  Location: [place];  Service: Orthopedics;  Laterality: N/A;  ? MODIFIED RADICAL MASTECTOMY W/ AXILLARY LYMPH NODE DISSECTION    ? Left  ? RECONSTRUCTION  BREAST W/ LATISSIMUS DORSI FLAP    ? left  ? REDUCTION MAMMAPLASTY Right   ? right foot surgery    ? corrected hammer toe, straightened big toe  ? SEPTOPLASTY    ? TOE SURGERY    ? TONSILLECTOMY AND ADENOIDECTOMY    ? ?Patient Active Problem List  ? Diagnosis Date Noted  ? CAP (community acquired pneumonia) 09/30/2020  ? Chronic respiratory failure with hypoxia ([place]) 09/23/2020  ? Allergic rhinitis 08/31/2019  ? Pilar cyst 08/04/2018  ? History of breast cancer in female 06/09/2018  ? History of reconstruction of both breasts 06/09/2018  ? Severe obesity (BMI >= 40) ([place]) 09/16/2015  ? Asthma with acute exacerbation 09/15/2015  ? DOE (dyspnea on exertion) 01/11/2014  ? Insomnia 05/05/2012  ? Osteoarthritis   ? Osteopenia   ? Collagenous colitis   ? OSA (obstructive sleep apnea)   ? Hypothyroidism   ? Mild persistent asthma   ? Hypertension   ? Breast cancer, left breast ([place])   ? ? ?REFERRING DIAG: L chronic RCT ? ?THERAPY DIAG:  ?Chronic left shoulder pain ? ?Muscle weakness (generalized) ? ?PERTINENT HISTORY: About 6 years ago, pt was sitting on a footstool and went  to pick up a toy that had fallen to the floor and grandson began to slide out of a chair and she threw her arm up to prevent him from falling.  She heard a pop. She had therapy before that assisted.In the past couple of months, pain began again insidiously. Pt has a history of hip pain of which she has a 21 month old prescription for as well; wants to concentrate on L shoulder first. She reports having a transport w/c to assist with cooking.  ? ?PRECAUTIONS: None ? ?SUBJECTIVE: "My shoulder was hurting this morning." ? ?PAIN:  ?Are you having pain? No ? ? ? ?OBJECTIVE:  ?  ?DIAGNOSTIC FINDINGS:  ?MRI 09/06/21 IMPRESSION: ?1. Moderate tendinosis of the supraspinatus tendon with a 10 mm ?full-thickness tear and 11 mm of retraction. ?2. Moderate tendinosis of the infraspinatus tendon. ?3. Moderate tendinosis of the subscapularis tendon. ?4. Mild tendinosis  of the intra-articular portion of the long head ?of the biceps tendon. ?5. Severe osteoarthritis of the left glenohumeral joint. ?  ?PATIENT SURVEYS:  ?FOTO : 51, predicted 38 ?  ?COGNITION: ?          Overall cognitive status: Within functional limits for tasks assessed ?                                 ?SENSATION: ?WFL ?  ?POSTURE: ?Dowers hump, rounded shoulders bil, forward head.  ?  ?UPPER EXTREMITY ROM:  ?  ?Active ROM-measured sitting Right ?09/19/2021 Left ?09/19/2021  ?Shoulder flexion WFL 81 degrees  ?Shoulder extension      ?Shoulder abduction WFL 40 deg with shoulder hike  ?Shoulder adduction      ?Shoulder internal rotation L4/5 L glute med   ?Shoulder external rotation T2 To L ear  ?Elbow flexion      ?Elbow extension      ?Wrist flexion      ?Wrist extension      ?Wrist ulnar deviation      ?Wrist radial deviation      ?Wrist pronation      ?Wrist supination      ?(Blank rows = not tested) ?  ?PROM: L shoulder supine: flexion to 120, abduction to 140, IR to approx 40, ER to approx 60 ?  ?UPPER EXTREMITY MMT: ?  ?MMT Right ?09/19/2021 Left ?09/19/2021  ?Shoulder flexion 3+/5 2-/5  ?Shoulder extension      ?Shoulder abduction 3-/5 2-/5  ?Shoulder adduction      ?Shoulder internal rotation 3+/5 2-/5  ?Shoulder external rotation 3+/5 2-/5  ?Middle trapezius      ?Lower trapezius      ?Elbow flexion 4+/5 3+/5  ?Elbow extension 4+/5 3/5  ?Wrist flexion      ?Wrist extension      ?Wrist ulnar deviation      ?Wrist radial deviation      ?Wrist pronation      ?Wrist supination      ?Grip strength (lbs)      ?(Blank rows = not tested) ?  ?Cervical AROM limited 25% all planes  ?  ?SHOULDER SPECIAL TESTS: ?           Impingement tests: Parkerson/Kennedy impingement test: positive  ?Rotator cuff assessment: Drop arm test: negative, Empty can test: positive , Full can test: negative, and see diagnostic section ?  ?  ?PALPATION:  ?L supraspinatus groove can be easily palpated compared to contralateral side supine;  scapulae equal ?            ?TODAY'S TREATMENT:  ? ?[place] Adult PT Treatment:                                                DATE: 09/24/21 ?Therapeutic Exercise: ?Supine AAROM FL with wand x20 ?Supine AAROM ER with wand x20 ?Seated scap retractions 2x10 ?Seated elbow flexion with 1# DB ?L shoulder isometrics x1 min ?Pendulums 2 x10 ?Manual Therapy: ?Joint mobs A/P and inferior ?PROM left shoulder flexion, ER, ABD ?  ?  ?PATIENT EDUCATION: ?Education details: reviewed PT POC, explained what tendonitis is ?Person educated: Patient ?Education method: Explanation ?Education comprehension: verbalized understanding ?  ?  ?HOME EXERCISE PROGRAM: ? ?Access Code: [email or web address] ?URL: [email or web address] ?Date: 09/24/2021 ?Prepared by: Edythe Lynn ? ?Exercises ?- Supine Shoulder Flexion AAROM with Dowel  - 1 x daily - 7 x weekly - 2 sets - 10 reps ?- Supine Shoulder External Rotation in 45 Degrees Abduction AAROM with Dowel  - 1 x daily - 7 x weekly - 2 sets - 10 reps ?- Seated Scapular Retraction  - 1 x daily - 7 x weekly - 2 sets - 10 reps ?- Circular Shoulder Pendulum with Table Support  - 1 x daily - 7 x weekly - 2 sets - 10 reps ?  ?  ?ASSESSMENT: ?  ?CLINICAL IMPRESSION: ?[name] arrives to PT session with 0/10 left shoulder pain. She tolerated gentle grade 1 and 2 GH joint mobs A to P and inferiorly to improve ROM. Session focused on AAROM and postural exercises. She tolerated exercises with no increase in pain.  ?  ?  ?OBJECTIVE IMPAIRMENTS decreased activity tolerance, decreased mobility, decreased ROM, decreased strength, impaired UE functional use, postural dysfunction, and pain.  ?  ?ACTIVITY LIMITATIONS cleaning, meal prep, shopping, and using L UE for fxnal activities .  ?  ?PERSONAL FACTORS 3+ comorbidities: L breast cancer history, osteopenia, asthma, rotator cuff complex impairments  are also affecting patient's functional outcome.  ?  ?  ?REHAB POTENTIAL: Good ?  ?CLINICAL DECISION MAKING:  Stable/uncomplicated ?  ?EVALUATION COMPLEXITY: Low ?  ?  ?GOALS: ?Goals reviewed with patient? Yes ?  ?SHORT TERM GOALS: [place] date: 10/17/2021 ?  ?Pt will be indep with initial HEP to assist with functional L UE stre

## 2021-09-26 NOTE — Therapy (Signed)
?OUTPATIENT PHYSICAL THERAPY TREATMENT NOTE ? ? ?Patient Name: [name] ?MRN: [id number] ?DOB:05/24/1941, 81 y.o., female ?Today's Date: 09/29/2021 ? ?PCP: Aura Dials, PA-C ?REFERRING PROVIDER: Aura Dials, PA-C ? ? PT End of Session - 09/29/21 1537   ? ? Visit Number 3   ? Number of Visits 12   ? Date for PT Re-Evaluation 10/31/21   ? Authorization Type [employer] MCR   ? Progress Note Due on Visit 10   ? PT Start Time 1537   ? PT Stop Time 1615   ? PT Time Calculation (min) 38 min   ? Activity Tolerance Patient tolerated treatment well;No increased pain   ? Behavior During Therapy [facility] for tasks assessed/performed   ? ?  ?  ? ?  ? ? ? ?Past Medical History:  ?Diagnosis Date  ? Anemia   ? PMH;  Patient denies this dx as of 06/03/21  ? Anxiety   ? Asthma   ? Breast cancer ([place]) 1988  ? left breast cancer  ? Collagenous colitis   ? Compression fracture   ? lumbar 1  ? Dyspnea   ? with exertion  ? Hypertension   ? Hypothyroidism   ? Osteoarthritis   ? Osteopenia   ? Pneumonia 07/2020  ? PONV (postoperative nausea and vomiting)   ? Sleep apnea   ? wears CPAP nightly  ? ?Past Surgical History:  ?Procedure Laterality Date  ? BREAST REDUCTION SURGERY    ? Right  ? CATARACT EXTRACTION W/ INTRAOCULAR LENS  IMPLANT, BILATERAL    ? CESAREAN SECTION    ? COLONOSCOPY W/ BIOPSIES AND POLYPECTOMY    ? CYST EXCISION N/A 08/30/2018  ? Procedure: excision of posterior shoulder / back sebaceous cyst;  Surgeon: Wallace Going, DO;  Location: [place];  Service: Plastics;  Laterality: N/A;  ? KYPHOPLASTY N/A 02/18/2016  ? Procedure: LUMBAR 1 KYPHOPLASTY;  Surgeon: Phylliss Bob, MD;  Location: [place];  Service: Orthopedics;  Laterality: N/A;  LUMBAR 1 KYPHOPLASTY  ? KYPHOPLASTY N/A 06/04/2021  ? Procedure: LUMBAR 2 KYPHOPLASTY;  Surgeon: Phylliss Bob, MD;  Location: [place];  Service: Orthopedics;  Laterality: N/A;  ? MODIFIED RADICAL MASTECTOMY W/ AXILLARY LYMPH NODE DISSECTION    ? Left  ? RECONSTRUCTION  BREAST W/ LATISSIMUS DORSI FLAP    ? left  ? REDUCTION MAMMAPLASTY Right   ? right foot surgery    ? corrected hammer toe, straightened big toe  ? SEPTOPLASTY    ? TOE SURGERY    ? TONSILLECTOMY AND ADENOIDECTOMY    ? ?Patient Active Problem List  ? Diagnosis Date Noted  ? CAP (community acquired pneumonia) 09/30/2020  ? Chronic respiratory failure with hypoxia ([place]) 09/23/2020  ? Allergic rhinitis 08/31/2019  ? Pilar cyst 08/04/2018  ? History of breast cancer in female 06/09/2018  ? History of reconstruction of both breasts 06/09/2018  ? Severe obesity (BMI >= 40) ([place]) 09/16/2015  ? Asthma with acute exacerbation 09/15/2015  ? DOE (dyspnea on exertion) 01/11/2014  ? Insomnia 05/05/2012  ? Osteoarthritis   ? Osteopenia   ? Collagenous colitis   ? OSA (obstructive sleep apnea)   ? Hypothyroidism   ? Mild persistent asthma   ? Hypertension   ? Breast cancer, left breast ([place])   ? ? ?REFERRING DIAG: L chronic RCT ? ?THERAPY DIAG:  ?Chronic left shoulder pain ? ?Muscle weakness (generalized) ? ?PERTINENT HISTORY: About 6 years ago, pt was sitting on a footstool and  went to pick up a toy that had fallen to the floor and grandson began to slide out of a chair and she threw her arm up to prevent him from falling.  She heard a pop. She had therapy before that assisted.In the past couple of months, pain began again insidiously. Pt has a history of hip pain of which she has a 40 month old prescription for as well; wants to concentrate on L shoulder first. She reports having a transport w/c to assist with cooking.  ? ?PRECAUTIONS: None ? ?SUBJECTIVE: Pt reports no shoulder pain today, although she does report pain this morning in her Rt hip from a diagnoses partially torn distal iliopsoas per pt report. She reports her Hep has been doing well, although she has problems with coordination in regard to shoulder pendulums. ? ?PAIN:  ?Are you having pain? No ? ? ? ?OBJECTIVE:  ? *Unless otherwise noted, objective information  collected previously* ? ?DIAGNOSTIC FINDINGS:  ?MRI 09/06/21 IMPRESSION: ?1. Moderate tendinosis of the supraspinatus tendon with a 10 mm ?full-thickness tear and 11 mm of retraction. ?2. Moderate tendinosis of the infraspinatus tendon. ?3. Moderate tendinosis of the subscapularis tendon. ?4. Mild tendinosis of the intra-articular portion of the long head ?of the biceps tendon. ?5. Severe osteoarthritis of the left glenohumeral joint. ?  ?PATIENT SURVEYS:  ?FOTO : 51, predicted 46 ?  ?COGNITION: ?          Overall cognitive status: Within functional limits for tasks assessed ?                                 ?SENSATION: ?WFL ?  ?POSTURE: ?Dowers hump, rounded shoulders bil, forward head.  ?  ?UPPER EXTREMITY ROM:  ?  ?Active ROM-measured sitting Right ?09/19/2021 Left ?09/19/2021  ?Shoulder flexion WFL 81 degrees  ?Shoulder extension      ?Shoulder abduction WFL 40 deg with shoulder hike  ?Shoulder adduction      ?Shoulder internal rotation L4/5 L glute med   ?Shoulder external rotation T2 To L ear  ?Elbow flexion      ?Elbow extension      ?Wrist flexion      ?Wrist extension      ?Wrist ulnar deviation      ?Wrist radial deviation      ?Wrist pronation      ?Wrist supination      ?(Blank rows = not tested) ?  ?PROM: L shoulder supine: flexion to 120, abduction to 140, IR to approx 40, ER to approx 60 ?  ?UPPER EXTREMITY MMT: ?  ?MMT Right ?09/19/2021 Left ?09/19/2021  ?Shoulder flexion 3+/5 2-/5  ?Shoulder extension      ?Shoulder abduction 3-/5 2-/5  ?Shoulder adduction      ?Shoulder internal rotation 3+/5 2-/5  ?Shoulder external rotation 3+/5 2-/5  ?Middle trapezius      ?Lower trapezius      ?Elbow flexion 4+/5 3+/5  ?Elbow extension 4+/5 3/5  ?Wrist flexion      ?Wrist extension      ?Wrist ulnar deviation      ?Wrist radial deviation      ?Wrist pronation      ?Wrist supination      ?Grip strength (lbs)      ?(Blank rows = not tested) ?  ?Cervical AROM limited 25% all planes  ?  ?SHOULDER SPECIAL TESTS: ?  Impingement tests: Laiche/Kennedy impingement test: positive  ?Rotator cuff assessment: Drop arm test: negative, Empty can test: positive , Full can test: negative, and see diagnostic section ?  ?  ?PALPATION:  ?L supraspinatus groove can be easily palpated compared to contralateral side supine; scapulae equal ?            ?TODAY'S TREATMENT:  ? ?[facility] Adult PT Treatment:                                                DATE: 09/29/2021 ?Therapeutic Exercise: ?Seated double shoulder ER with GTB 3x10 ?Seated BIL shoulder rolls 2x10 forward and  ?Forward shoulder flexion AAROM with physioball at wall with PT perturbations at end range 3x30 seconds ?Seated BIL scaption while holding black physioball 2x8 BIL ?Seated low rows with 20# cable 2x10 ?Seated high rows with 20# cable 2x10 ?Seated lat pull-down with 10# cable 2x10 ?Manual Therapy: ?N/A ?Neuromuscular re-ed: ?N/A ?Therapeutic Activity: ?N/A ?Modalities: ?N/A ?Self Care: ?N/A ? ? ?[place] Adult PT Treatment:                                                DATE: 09/24/21 ?Therapeutic Exercise: ?Supine AAROM FL with wand x20 ?Supine AAROM ER with wand x20 ?Seated scap retractions 2x10 ?Seated elbow flexion with 1# DB ?L shoulder isometrics x1 min ?Pendulums 2 x10 ?Manual Therapy: ?Joint mobs A/P and inferior ?PROM left shoulder flexion, ER, ABD ?  ?  ?PATIENT EDUCATION: ?Education details: reviewed PT POC, explained what tendonitis is ?Person educated: Patient ?Education method: Explanation ?Education comprehension: verbalized understanding ?  ?  ?HOME EXERCISE PROGRAM: ? ?Access Code: [email or web address] ?URL: [email or web address] ?Date: 09/24/2021 ?Prepared by: Edythe Lynn ? ?Exercises ?- Supine Shoulder Flexion AAROM with Dowel  - 1 x daily - 7 x weekly - 2 sets - 10 reps ?- Supine Shoulder External Rotation in 45 Degrees Abduction AAROM with Dowel  - 1 x daily - 7 x weekly - 2 sets - 10 reps ?- Seated Scapular Retraction  - 1 x daily - 7 x weekly - 2 sets -  10 reps ?- Circular Shoulder Pendulum with Table Support  - 1 x daily - 7 x weekly - 2 sets - 10 reps ?  ?  ?ASSESSMENT: ?  ?CLINICAL IMPRESSION: ?Pt responded well to all interventions today, demonstrating

## 2021-09-28 NOTE — Progress Notes (Signed)
? ?Primary Physician/Referring:  Aura Dials, PA-C ? ?Patient ID: [name], female    DOB: 1941/01/28, 81 y.o.   MRN: [id number] ? ?Chief Complaint  ?Patient presents with  ? Chronic diastolic heart failure ([place])  ? Hypertension  ?  3 months  ? ?HPI:   ? ?[name]  is a 81 y.o. Caucasian female  with hypothyroidism, hypertension, depression/anxiety, OSA on CPAP, history of left breast cancer, hypocalcemia, CKD stage 3. She did not tolerate Bisoprolol,  labetalol due to worsening asthma and Aldactone caused worsening renal function.  ? ?Patient presents for 3 months follow up. At last visit reduced Lasix to 40 mg daily, she was otherwise stable from a cardiac standpoint. Patient is dealing with chronic pain for which she is undergoing PT, but is otherwise doing well overall. Blood pressure is well controlled. Leg swelling is minimal and intermittent. She is presently taking Lasix 40 mg daily, with additional doses as needed.  ? ?Patient continues to lose weight, which she is congratulated on.   ? ?Past Medical History:  ?Diagnosis Date  ? Anemia   ? PMH;  Patient denies this dx as of 06/03/21  ? Anxiety   ? Asthma   ? Breast cancer ([place]) 1988  ? left breast cancer  ? Collagenous colitis   ? Compression fracture   ? lumbar 1  ? Dyspnea   ? with exertion  ? Hypertension   ? Hypothyroidism   ? Osteoarthritis   ? Osteopenia   ? Pneumonia 07/2020  ? PONV (postoperative nausea and vomiting)   ? Sleep apnea   ? wears CPAP nightly  ? ?Past Surgical History:  ?Procedure Laterality Date  ? BREAST REDUCTION SURGERY    ? Right  ? CATARACT EXTRACTION W/ INTRAOCULAR LENS  IMPLANT, BILATERAL    ? CESAREAN SECTION    ? COLONOSCOPY W/ BIOPSIES AND POLYPECTOMY    ? CYST EXCISION N/A 08/30/2018  ? Procedure: excision of posterior shoulder / back sebaceous cyst;  Surgeon: Wallace Going, DO;  Location: [place];  Service: Plastics;  Laterality: N/A;  ? KYPHOPLASTY N/A 02/18/2016  ? Procedure:  LUMBAR 1 KYPHOPLASTY;  Surgeon: Phylliss Bob, MD;  Location: [place];  Service: Orthopedics;  Laterality: N/A;  LUMBAR 1 KYPHOPLASTY  ? KYPHOPLASTY N/A 06/04/2021  ? Procedure: LUMBAR 2 KYPHOPLASTY;  Surgeon: Phylliss Bob, MD;  Location: [place];  Service: Orthopedics;  Laterality: N/A;  ? MODIFIED RADICAL MASTECTOMY W/ AXILLARY LYMPH NODE DISSECTION    ? Left  ? RECONSTRUCTION BREAST W/ LATISSIMUS DORSI FLAP    ? left  ? REDUCTION MAMMAPLASTY Right   ? right foot surgery    ? corrected hammer toe, straightened big toe  ? SEPTOPLASTY    ? TOE SURGERY    ? TONSILLECTOMY AND ADENOIDECTOMY    ? ?Family History  ?Problem Relation Age of Onset  ? Lung cancer Mother   ? Hypertension Mother   ? Hypertension Father   ? Ovarian cancer Sister   ? Hypertension Brother   ? Angina Brother 73  ? Breast cancer Maternal Aunt   ? Brain cancer Maternal Aunt   ? Kidney cancer Paternal Uncle   ? Prostate cancer Paternal Uncle   ? Breast cancer Other   ?     cousin  ? Ovarian cancer Other   ?     cousin  ? Kidney cancer Other   ?     cousin  ?  ?Social History  ? ?  Tobacco Use  ? Smoking status: Never  ? Smokeless tobacco: Never  ?Substance Use Topics  ? Alcohol use: Yes  ?  Alcohol/week: 1.0 standard drink  ?  Types: 1 Glasses of wine per week  ?  Comment: wine in evenings but none since 04/2021  ? ?Marital Status: Married  ?ROS  ?Review of Systems  ?Constitutional: Negative for malaise/fatigue and weight gain.  ?Cardiovascular:  Positive for dyspnea on exertion (stable) and leg swelling (mild, intermittent). Negative for chest pain, claudication, near-syncope, orthopnea, palpitations, paroxysmal nocturnal dyspnea and syncope.  ?Neurological:  Negative for dizziness.  ? ?Objective  ?Blood pressure 132/69, pulse 78, temperature 98 ?F (36.7 ?C), temperature source Temporal, resp. rate 17, height 5' 1.5" (1.562 m), weight 236 lb (107 kg), SpO2 93 %.  ? ?  09/30/2021  ? 10:43 AM 07/01/2021  ?  1:18 PM 06/04/2021  ?  3:45 PM  ?Vitals with BMI   ?Height 5' 1.5" 5' 1.5"   ?Weight 236 lbs 238 lbs 10 oz   ?BMI 43.88 44.36   ?Systolic 854 627 035  ?Diastolic 69 62 57  ?Pulse 78 64 70  ?  ? Physical Exam ?Vitals reviewed.  ?Constitutional:   ?   General: She is not in acute distress. ?   Appearance: She is well-developed. She is obese. She is not ill-appearing.  ?Cardiovascular:  ?   Rate and Rhythm: Normal rate and regular rhythm.  ?   Pulses: Normal pulses and intact distal pulses.     ?     Carotid pulses are 2+ on the right side and 2+ on the left side. ?     Dorsalis pedis pulses are 2+ on the right side and 2+ on the left side.  ?     Posterior tibial pulses are 2+ on the right side and 2+ on the left side.  ?   Heart sounds: S1 normal and S2 normal. No murmur heard. ?  No gallop.  ?   Comments: No JVD. ?Pulmonary:  ?   Effort: Pulmonary effort is normal. No accessory muscle usage or respiratory distress.  ?   Breath sounds: No decreased breath sounds, wheezing, rhonchi or rales.  ?Musculoskeletal:  ?   Right lower leg: No edema.  ?   Left lower leg: No edema.  ?Neurological:  ?   Mental Status: She is alert.  ?Physical exam unchanged compared to previous  ? ?Laboratory examination:  ? ?Recent Labs  ?  12/30/20 ?1533 06/04/21 ?1035  ?NA 143 137  ?K 3.7 3.7  ?CL 104 104  ?CO2 20 25  ?GLUCOSE 109* 100*  ?BUN 18 17  ?CREATININE 1.04* 0.80  ?CALCIUM 10.4* 9.0  ?GFRNONAA  --  >60  ? ?CrCl cannot be calculated (Patient's most recent lab result is older than the maximum 21 days allowed.).  ? ?  Latest Ref Rng & Units 06/04/2021  ? 10:35 AM 12/30/2020  ?  3:33 PM 09/02/2020  ?  3:28 PM  ?CMP  ?Glucose 70 - 99 mg/dL 100   109   100    ?BUN 8 - 23 mg/dL _0 ?Creatinine 0.44 - 1.00 mg/dL 0.80   1.04   0.88    ?Sodium 135 - 145 mmol/L 137   143   145    ?Potassium 3.5 - 5.1 mmol/L 3.7   3.7   3.4    ?Chloride 98 - 111 mmol/L 104   104  104    ?CO2 22 - 32 mmol/L _0 ?Calcium 8.9 - 10.3 mg/dL 9.0   10.4   9.8    ?Total Protein 6.5 - 8.1 g/dL 6.1       ?Total Bilirubin 0.3 - 1.2 mg/dL 0.9      ?Alkaline Phos 38 - 126 U/L 81      ?AST 15 - 41 U/L 23      ?ALT 0 - 44 U/L 24      ? ? ?  Latest Ref Rng & Units 06/04/2021  ? 10:35 AM 09/02/2020  ?  3:28 PM 02/18/2016  ?  1:09 PM  ?CBC  ?WBC 4.0 - 10.5 K/uL 4.9   7.8   5.9    ?Hemoglobin 12.0 - 15.0 g/dL 13.1   12.2   13.8    ?Hematocrit 36.0 - 46.0 % 39.7   36.0   42.5    ?Platelets 150 - 400 K/uL 179   185   168    ? ?Lipid Panel  ?No results found for: CHOL, TRIG, HDL, CHOLHDL, VLDL, LDLCALC, LDLDIRECT ?HEMOGLOBIN A1C ?No results found for: HGBA1C, MPG ?TSH ?No results for input(s): TSH in the last 8760 hours. ? ?BNP (last 3 results) ?No results for input(s): BNP in the last 8760 hours. ? ? ?External labs: ?04/27/2021:  ?Glucose 70 - 99 mg/dL 89   ?BUN 8 - 27 mg/dL 14   ?Creatinine 0.57 - 1.00 mg/dL 0.75   ?eGFR >59 mL/min/1.73 82   ?BUN/Creatinine Ratio 12 - 28 19   ?Sodium 134 - 144 mmol/L 142   ?Potassium 3.5 - 5.2 mmol/L 4.1   ?Chloride 96 - 106 mmol/L 105   ?CO2 20 - 29 mmol/L 22   ?CALCIUM 8.7 - 10.3 mg/dL 10.0   ?Total Protein 6.0 - 8.5 g/dL 6.4   ?Albumin, Serum 3.7 - 4.7 g/dL 4.3   ?Globulin, Total 1.5 - 4.5 g/dL 2.1   ?Albumin/Globulin Ratio 1.2 - 2.2 2.0   ?Total Bilirubin 0.0 - 1.2 mg/dL 0.3   ?Alkaline Phosphatase 44 - 121 IU/L 82   ?AST 0 - 40 IU/L 22   ?ALT (SGPT) 0 - 32 IU/L 19   ? ?Cholesterol, Total 100 - 199 mg/dL 208 High     ?Triglycerides 0 - 149 mg/dL 91    ?HDL >39 mg/dL 107    ?VLDL Cholesterol Cal 5 - 40 mg/dL 16    ?LDL 0 - 99 mg/dL 85    ?TSH 3.88 ? ? ?Labs 04/01/2020: ? ?A1c 5.1%.  TSH normal. ? ?Hb 12.6/HCT 38.4, mild macrocytosis present, platelets 172. ? ?Serum glucose 94 mg, BUN 17, creatinine 0.86, EGFR 66 mL, sodium 142, potassium 3.6, CMP otherwise normal. ? ?Total cholesterol 217, triglycerides 68, HDL 118, LDL 87. ?03/21/2019: BUN 22 creatinine 0.97, EGFR 57 mL, sodium 144, potassium 3.9. Total cholesterol 185, triglycerides 126, HDL 87, LDL 77. ? ?Allergies  ? ?Allergies   ?Allergen Reactions  ? Nebivolol Hcl Other (See Comments)  ?  Hypotensive episode  ? Bactrim [Sulfamethoxazole-Trimethoprim] Other (See Comments)  ?  UNSPECIFIED ?  ? Dairy Aid [Tilactase] Diarrhea and Other (See C

## 2021-09-29 ENCOUNTER — Other Ambulatory Visit: Payer: Self-pay

## 2021-09-29 ENCOUNTER — Ambulatory Visit: Payer: Medicare HMO | Admitting: Student

## 2021-09-29 ENCOUNTER — Ambulatory Visit: Payer: Medicare HMO

## 2021-09-29 VITALS — BP 132/69 | HR 78 | Temp 98.0°F | Resp 17 | Ht 61.5 in | Wt 236.0 lb

## 2021-09-29 DIAGNOSIS — M25512 Pain in left shoulder: Secondary | ICD-10-CM | POA: Diagnosis not present

## 2021-09-29 DIAGNOSIS — I10 Essential (primary) hypertension: Secondary | ICD-10-CM

## 2021-09-29 DIAGNOSIS — I5032 Chronic diastolic (congestive) heart failure: Secondary | ICD-10-CM

## 2021-09-29 DIAGNOSIS — G8929 Other chronic pain: Secondary | ICD-10-CM

## 2021-09-29 DIAGNOSIS — M6281 Muscle weakness (generalized): Secondary | ICD-10-CM

## 2021-09-30 ENCOUNTER — Encounter: Payer: Self-pay | Admitting: Student

## 2021-10-01 ENCOUNTER — Ambulatory Visit: Payer: Medicare HMO

## 2021-10-05 NOTE — Therapy (Signed)
?OUTPATIENT PHYSICAL THERAPY TREATMENT NOTE ? ? ?Patient Name: [name] ?MRN: [id number] ?DOB:April 09, 1941, 81 y.o., female ?Today's Date: 10/06/2021 ? ?PCP: Aura Dials, PA-C ?REFERRING PROVIDER: Aura Dials, PA-C ? ? PT End of Session - 10/06/21 1701   ? ? Visit Number 4   ? Number of Visits 12   ? Date for PT Re-Evaluation 10/31/21   ? Authorization Type Aetna MCR   ? Authorization Time Period FOTO V6, V10, kx mod v15   ? Progress Note Due on Visit 10   ? PT Start Time 1703   ? PT Stop Time 7829   ? PT Time Calculation (min) 42 min   ? Activity Tolerance Patient tolerated treatment well;No increased pain   ? Behavior During Therapy [facility] for tasks assessed/performed   ? ?  ?  ? ?  ? ? ? ? ?Past Medical History:  ?Diagnosis Date  ? Anemia   ? PMH;  Patient denies this dx as of 06/03/21  ? Anxiety   ? Asthma   ? Breast cancer ([place]) 1988  ? left breast cancer  ? Collagenous colitis   ? Compression fracture   ? lumbar 1  ? Dyspnea   ? with exertion  ? Hypertension   ? Hypothyroidism   ? Osteoarthritis   ? Osteopenia   ? Pneumonia 07/2020  ? PONV (postoperative nausea and vomiting)   ? Sleep apnea   ? wears CPAP nightly  ? ?Past Surgical History:  ?Procedure Laterality Date  ? BREAST REDUCTION SURGERY    ? Right  ? CATARACT EXTRACTION W/ INTRAOCULAR LENS  IMPLANT, BILATERAL    ? CESAREAN SECTION    ? COLONOSCOPY W/ BIOPSIES AND POLYPECTOMY    ? CYST EXCISION N/A 08/30/2018  ? Procedure: excision of posterior shoulder / back sebaceous cyst;  Surgeon: Wallace Going, DO;  Location: [facility];  Service: Plastics;  Laterality: N/A;  ? KYPHOPLASTY N/A 02/18/2016  ? Procedure: LUMBAR 1 KYPHOPLASTY;  Surgeon: Phylliss Bob, MD;  Location: [facility];  Service: Orthopedics;  Laterality: N/A;  LUMBAR 1 KYPHOPLASTY  ? KYPHOPLASTY N/A 06/04/2021  ? Procedure: LUMBAR 2 KYPHOPLASTY;  Surgeon: Phylliss Bob, MD;  Location: [facility];  Service: Orthopedics;  Laterality: N/A;  ? MODIFIED RADICAL MASTECTOMY W/  AXILLARY LYMPH NODE DISSECTION    ? Left  ? RECONSTRUCTION BREAST W/ LATISSIMUS DORSI FLAP    ? left  ? REDUCTION MAMMAPLASTY Right   ? right foot surgery    ? corrected hammer toe, straightened big toe  ? SEPTOPLASTY    ? TOE SURGERY    ? TONSILLECTOMY AND ADENOIDECTOMY    ? ?Patient Active Problem List  ? Diagnosis Date Noted  ? CAP (community acquired pneumonia) 09/30/2020  ? Chronic respiratory failure with hypoxia ([place]) 09/23/2020  ? Allergic rhinitis 08/31/2019  ? Pilar cyst 08/04/2018  ? History of breast cancer in female 06/09/2018  ? History of reconstruction of both breasts 06/09/2018  ? Severe obesity (BMI >= 40) ([place]) 09/16/2015  ? Asthma with acute exacerbation 09/15/2015  ? DOE (dyspnea on exertion) 01/11/2014  ? Insomnia 05/05/2012  ? Osteoarthritis   ? Osteopenia   ? Collagenous colitis   ? OSA (obstructive sleep apnea)   ? Hypothyroidism   ? Mild persistent asthma   ? Hypertension   ? Breast cancer, left breast ([place])   ? ? ?REFERRING DIAG: L chronic RCT ? ?THERAPY DIAG:  ?Chronic left shoulder pain ? ?Muscle weakness (generalized) ? ?  PERTINENT HISTORY: About 6 years ago, pt was sitting on a footstool and went to pick up a toy that had fallen to the floor and grandson began to slide out of a chair and she threw her arm up to prevent him from falling.  She heard a pop. She had therapy before that assisted.In the past couple of months, pain began again insidiously. Pt has a history of hip pain of which she has a 75 month old prescription for as well; wants to concentrate on L shoulder first. She reports having a transport w/c to assist with cooking.  ? ?PRECAUTIONS: None ? ?SUBJECTIVE: Pt reports no shoulder pain, reporting only mild Rt hip pain and Lt knee pain. Pt reports daily adherence to her HEP. ? ?PAIN:  ?Are you having pain? No ? ? ? ?OBJECTIVE:  ? *Unless otherwise noted, objective information collected previously* ? ?DIAGNOSTIC FINDINGS:  ?MRI 09/06/21 IMPRESSION: ?1. Moderate tendinosis of the  supraspinatus tendon with a 10 mm ?full-thickness tear and 11 mm of retraction. ?2. Moderate tendinosis of the infraspinatus tendon. ?3. Moderate tendinosis of the subscapularis tendon. ?4. Mild tendinosis of the intra-articular portion of the long head ?of the biceps tendon. ?5. Severe osteoarthritis of the left glenohumeral joint. ?  ?PATIENT SURVEYS:  ?FOTO : 51, predicted 85 ?  ?COGNITION: ?          Overall cognitive status: Within functional limits for tasks assessed ?                                 ?SENSATION: ?WFL ?  ?POSTURE: ?Dowers hump, rounded shoulders bil, forward head.  ?  ?UPPER EXTREMITY ROM:  ?  ?Active ROM-measured sitting Right ?09/19/2021 Left ?09/19/2021  ?Shoulder flexion WFL 81 degrees  ?Shoulder extension      ?Shoulder abduction WFL 40 deg with shoulder hike  ?Shoulder adduction      ?Shoulder internal rotation L4/5 L glute med   ?Shoulder external rotation T2 To L ear  ?Elbow flexion      ?Elbow extension      ?Wrist flexion      ?Wrist extension      ?Wrist ulnar deviation      ?Wrist radial deviation      ?Wrist pronation      ?Wrist supination      ?(Blank rows = not tested) ?  ?PROM: L shoulder supine: flexion to 120, abduction to 140, IR to approx 40, ER to approx 60 ?  ?UPPER EXTREMITY MMT: ?  ?MMT Right ?09/19/2021 Left ?09/19/2021  ?Shoulder flexion 3+/5 2-/5  ?Shoulder extension      ?Shoulder abduction 3-/5 2-/5  ?Shoulder adduction      ?Shoulder internal rotation 3+/5 2-/5  ?Shoulder external rotation 3+/5 2-/5  ?Middle trapezius      ?Lower trapezius      ?Elbow flexion 4+/5 3+/5  ?Elbow extension 4+/5 3/5  ?Wrist flexion      ?Wrist extension      ?Wrist ulnar deviation      ?Wrist radial deviation      ?Wrist pronation      ?Wrist supination      ?Grip strength (lbs)      ?(Blank rows = not tested) ?  ?Cervical AROM limited 25% all planes  ?  ?SHOULDER SPECIAL TESTS: ?           Impingement tests: Winward/Kennedy impingement test: positive  ?Rotator cuff  assessment: Drop arm  test: negative, Empty can test: positive , Full can test: negative, and see diagnostic section ?  ?  ?PALPATION:  ?L supraspinatus groove can be easily palpated compared to contralateral side supine; scapulae equal ?            ?TODAY'S TREATMENT:  ?[facility] Adult PT Treatment:                                                DATE: 10/06/2021 ?Therapeutic Exercise: ?NuStep arms only level 5 x5 minutes while collecting subjective information ?Standing Lt shoulder UE Ranger with lateral BTB pull and PT perturbations 2x10 each side ?Mini-squat into momentum-based BIL shoulder flexion while holding a 5# dumbbell, followed by slow, eccentric lowering 3x8 ?Lt shoulder ER walkout with RTB 2x8 with 5-sec hold at end-range ?Seated low rows with 20# cable x10 ?Seated high rows with 20# cable x10 ?Seated lat pull-down with 10# cable x10 ?Shoulder rolls 2x10 forward/ backward ?Manual Therapy: ?N/A ?Neuromuscular re-ed: ?N/A ?Therapeutic Activity: ?N/A ?Modalities: ?N/A ?Self Care: ?N/A ? ? ? ?[facility] Adult PT Treatment:                                                DATE: 09/29/2021 ?Therapeutic Exercise: ?Seated double shoulder ER with GTB 3x10 ?Seated BIL shoulder rolls 2x10 forward and  ?Forward shoulder flexion AAROM with physioball at wall with PT perturbations at end range 3x30 seconds ?Seated BIL scaption while holding black physioball 2x8 BIL ?Seated low rows with 20# cable 2x10 ?Seated high rows with 20# cable 2x10 ?Seated lat pull-down with 10# cable 2x10 ?Manual Therapy: ?N/A ?Neuromuscular re-ed: ?N/A ?Therapeutic Activity: ?N/A ?Modalities: ?N/A ?Self Care: ?N/A ? ? ?[facility] Adult PT Treatment:                                                DATE: 09/24/21 ?Therapeutic Exercise: ?Supine AAROM FL with wand x20 ?Supine AAROM ER with wand x20 ?Seated scap retractions 2x10 ?Seated elbow flexion with 1# DB ?L shoulder isometrics x1 min ?Pendulums 2 x10 ?Manual Therapy: ?Joint mobs A/P and inferior ?PROM left shoulder flexion, ER, ABD ?   ?  ?PATIENT EDUCATION: ?Education details: reviewed PT POC, explained what tendonitis is ?Person educated: Patient ?Education method: Explanation ?Education comprehension: verbalized understanding ?  ?

## 2021-10-06 ENCOUNTER — Ambulatory Visit: Payer: Medicare HMO | Attending: Orthopaedic Surgery

## 2021-10-06 DIAGNOSIS — M25512 Pain in left shoulder: Secondary | ICD-10-CM | POA: Diagnosis present

## 2021-10-06 DIAGNOSIS — M6281 Muscle weakness (generalized): Secondary | ICD-10-CM | POA: Diagnosis present

## 2021-10-06 DIAGNOSIS — G8929 Other chronic pain: Secondary | ICD-10-CM | POA: Diagnosis present

## 2021-10-07 NOTE — Therapy (Signed)
?OUTPATIENT PHYSICAL THERAPY TREATMENT NOTE ? ? ?Patient Name: [name] ?MRN: [id number] ?DOB:23-Jan-1941, 81 y.o., female ?Today's Date: 10/08/2021 ? ?PCP: Aura Dials, PA-C ?REFERRING PROVIDER: Melrose Nakayama, MD ? ? PT End of Session - 10/08/21 1357   ? ? Visit Number 5   ? Number of Visits 12   ? Date for PT Re-Evaluation 10/31/21   ? Authorization Type Aetna MCR   ? Authorization Time Period FOTO V6, V10, kx mod v15   ? Progress Note Due on Visit 10   ? PT Start Time 1400   ? PT Stop Time 1450   10 minutes vasopneumatic treatment  ? PT Time Calculation (min) 50 min   ? Activity Tolerance Patient tolerated treatment well;No increased pain   ? Behavior During Therapy [facility] for tasks assessed/performed   ? ?  ?  ? ?  ? ? ? ? ? ?Past Medical History:  ?Diagnosis Date  ? Anemia   ? PMH;  Patient denies this dx as of 06/03/21  ? Anxiety   ? Asthma   ? Breast cancer ([place]) 1988  ? left breast cancer  ? Collagenous colitis   ? Compression fracture   ? lumbar 1  ? Dyspnea   ? with exertion  ? Hypertension   ? Hypothyroidism   ? Osteoarthritis   ? Osteopenia   ? Pneumonia 07/2020  ? PONV (postoperative nausea and vomiting)   ? Sleep apnea   ? wears CPAP nightly  ? ?Past Surgical History:  ?Procedure Laterality Date  ? BREAST REDUCTION SURGERY    ? Right  ? CATARACT EXTRACTION W/ INTRAOCULAR LENS  IMPLANT, BILATERAL    ? CESAREAN SECTION    ? COLONOSCOPY W/ BIOPSIES AND POLYPECTOMY    ? CYST EXCISION N/A 08/30/2018  ? Procedure: excision of posterior shoulder / back sebaceous cyst;  Surgeon: Wallace Going, DO;  Location: [facility];  Service: Plastics;  Laterality: N/A;  ? KYPHOPLASTY N/A 02/18/2016  ? Procedure: LUMBAR 1 KYPHOPLASTY;  Surgeon: Phylliss Bob, MD;  Location: [facility];  Service: Orthopedics;  Laterality: N/A;  LUMBAR 1 KYPHOPLASTY  ? KYPHOPLASTY N/A 06/04/2021  ? Procedure: LUMBAR 2 KYPHOPLASTY;  Surgeon: Phylliss Bob, MD;  Location: [facility];  Service: Orthopedics;  Laterality: N/A;  ?  MODIFIED RADICAL MASTECTOMY W/ AXILLARY LYMPH NODE DISSECTION    ? Left  ? RECONSTRUCTION BREAST W/ LATISSIMUS DORSI FLAP    ? left  ? REDUCTION MAMMAPLASTY Right   ? right foot surgery    ? corrected hammer toe, straightened big toe  ? SEPTOPLASTY    ? TOE SURGERY    ? TONSILLECTOMY AND ADENOIDECTOMY    ? ?Patient Active Problem List  ? Diagnosis Date Noted  ? CAP (community acquired pneumonia) 09/30/2020  ? Chronic respiratory failure with hypoxia ([place]) 09/23/2020  ? Allergic rhinitis 08/31/2019  ? Pilar cyst 08/04/2018  ? History of breast cancer in female 06/09/2018  ? History of reconstruction of both breasts 06/09/2018  ? Severe obesity (BMI >= 40) ([place]) 09/16/2015  ? Asthma with acute exacerbation 09/15/2015  ? DOE (dyspnea on exertion) 01/11/2014  ? Insomnia 05/05/2012  ? Osteoarthritis   ? Osteopenia   ? Collagenous colitis   ? OSA (obstructive sleep apnea)   ? Hypothyroidism   ? Mild persistent asthma   ? Hypertension   ? Breast cancer, left breast ([place])   ? ? ?REFERRING DIAG: L chronic RCT ? ?THERAPY DIAG:  ?Chronic left shoulder pain ? ?  Muscle weakness (generalized) ? ?PERTINENT HISTORY: About 6 years ago, pt was sitting on a footstool and went to pick up a toy that had fallen to the floor and grandson began to slide out of a chair and she threw her arm up to prevent him from falling.  She heard a pop. She had therapy before that assisted.In the past couple of months, pain began again insidiously. Pt has a history of hip pain of which she has a 42 month old prescription for as well; wants to concentrate on L shoulder first. She reports having a transport w/c to assist with cooking.  ? ?PRECAUTIONS: None ? ?SUBJECTIVE: Pt reports experiencing intense Lt shoulder pain Tuesday night after her session, which improved yesterday, and is resolved today. She reports no pain today, but adds that she thinks the "pulling" exercises (lat pulls) may have increased her symptoms. ? ?PAIN:  ?Are you having pain?  No ? ? ? ?OBJECTIVE:  ? *Unless otherwise noted, objective information collected previously* ? ?DIAGNOSTIC FINDINGS:  ?MRI 09/06/21 IMPRESSION: ?1. Moderate tendinosis of the supraspinatus tendon with a 10 mm ?full-thickness tear and 11 mm of retraction. ?2. Moderate tendinosis of the infraspinatus tendon. ?3. Moderate tendinosis of the subscapularis tendon. ?4. Mild tendinosis of the intra-articular portion of the long head ?of the biceps tendon. ?5. Severe osteoarthritis of the left glenohumeral joint. ?  ?PATIENT SURVEYS:  ?FOTO : 51, predicted 74 ?  ?COGNITION: ?          Overall cognitive status: Within functional limits for tasks assessed ?                                 ?SENSATION: ?WFL ?  ?POSTURE: ?Dowers hump, rounded shoulders bil, forward head.  ?  ?UPPER EXTREMITY ROM:  ?  ?Active ROM-measured sitting Right ?09/19/2021 Left ?09/19/2021  ?Shoulder flexion WFL 81 degrees  ?Shoulder extension      ?Shoulder abduction WFL 40 deg with shoulder hike  ?Shoulder adduction      ?Shoulder internal rotation L4/5 L glute med   ?Shoulder external rotation T2 To L ear  ?Elbow flexion      ?Elbow extension      ?Wrist flexion      ?Wrist extension      ?Wrist ulnar deviation      ?Wrist radial deviation      ?Wrist pronation      ?Wrist supination      ?(Blank rows = not tested) ?  ?PROM: L shoulder supine: flexion to 120, abduction to 140, IR to approx 40, ER to approx 60 ?  ?UPPER EXTREMITY MMT: ?  ?MMT Right ?09/19/2021 Left ?09/19/2021  ?Shoulder flexion 3+/5 2-/5  ?Shoulder extension      ?Shoulder abduction 3-/5 2-/5  ?Shoulder adduction      ?Shoulder internal rotation 3+/5 2-/5  ?Shoulder external rotation 3+/5 2-/5  ?Middle trapezius      ?Lower trapezius      ?Elbow flexion 4+/5 3+/5  ?Elbow extension 4+/5 3/5  ?Wrist flexion      ?Wrist extension      ?Wrist ulnar deviation      ?Wrist radial deviation      ?Wrist pronation      ?Wrist supination      ?Grip strength (lbs)      ?(Blank rows = not tested) ?   ?Cervical AROM limited 25% all planes  ?  ?SHOULDER  SPECIAL TESTS: ?           Impingement tests: [place]on/Kennedy impingement test: positive  ?Rotator cuff assessment: Drop arm test: negative, Empty can test: positive , Full can test: negative, and see diagnostic section ?  ?  ?PALPATION:  ?L supraspinatus groove can be easily palpated compared to contralateral side supine; scapulae equal ?            ?TODAY'S TREATMENT:  ? ?[facility] Adult PT Treatment:                                                DATE: 10/08/2021 ?Therapeutic Exercise: ?UBE level 1 x2 minutes forward/ x2 minutes backward while collecting subjective information ?Sidelying Lt shoulder ER with 3# dumbbell 3x10 ?Standing low rows with 20# cable 3x10 ?Standing shoulder rolls 2x10 forward and backward ?Standing Lt shoulder scaption stepwise 1# dumbbell lifts to shoulder-level shelf and then to overhead shelf and back down 2x6 ?Bent-over Lt shoulder pendulums with 1# dumbbell 2x30sec ?Manual Therapy: ?N/A ?Neuromuscular re-ed: ?N/A ?Therapeutic Activity: ?N/A ?Modalities: ?[place] vasopneumatic treatment at 34d F to Lt shoulder x10 minutes with no adverse response ?Self Care: ?N/A ? ? ?[facility] Adult PT Treatment:                                                DATE: 10/06/2021 ?Therapeutic Exercise: ?NuStep arms only level 5 x5 minutes while collecting subjective information ?Standing Lt shoulder UE Ranger with lateral BTB pull and PT perturbations 2x10 each side ?Mini-squat into momentum-based BIL shoulder flexion while holding a 5# dumbbell, followed by slow, eccentric lowering 3x8 ?Lt shoulder ER walkout with RTB 2x8 with 5-sec hold at end-range ?Seated low rows with 20# cable x10 ?Seated high rows with 20# cable x10 ?Seated lat pull-down with 10# cable x10 ?Shoulder rolls 2x10 forward/ backward ?Manual Therapy: ?N/A ?Neuromuscular re-ed: ?N/A ?Therapeutic Activity: ?N/A ?Modalities: ?N/A ?Self Care: ?N/A ? ? ? ?[facility] Adult PT Treatment:                                                 DATE: 09/29/2021 ?Therapeutic Exercise: ?Seated double shoulder ER with GTB 3x10 ?Seated BIL shoulder rolls 2x10 forward and  ?Forward shoulder flexion AAROM with physioball at wall with P

## 2021-10-08 ENCOUNTER — Ambulatory Visit: Payer: Medicare HMO

## 2021-10-08 DIAGNOSIS — G8929 Other chronic pain: Secondary | ICD-10-CM

## 2021-10-08 DIAGNOSIS — M25512 Pain in left shoulder: Secondary | ICD-10-CM | POA: Diagnosis not present

## 2021-10-08 DIAGNOSIS — M6281 Muscle weakness (generalized): Secondary | ICD-10-CM

## 2021-10-13 ENCOUNTER — Ambulatory Visit: Payer: Medicare HMO

## 2021-10-13 DIAGNOSIS — G8929 Other chronic pain: Secondary | ICD-10-CM

## 2021-10-13 DIAGNOSIS — M6281 Muscle weakness (generalized): Secondary | ICD-10-CM

## 2021-10-13 DIAGNOSIS — M25512 Pain in left shoulder: Secondary | ICD-10-CM | POA: Diagnosis not present

## 2021-10-13 NOTE — Therapy (Signed)
?OUTPATIENT PHYSICAL THERAPY TREATMENT NOTE ? ? ?Patient Name: [name] ?MRN: [id number] ?DOB:Aug 21, 1940, 81 y.o., female ?Today's Date: 10/13/2021 ? ?PCP: Aura Dials, PA-C ?REFERRING PROVIDER: Aura Dials, PA-C ? ? PT End of Session - 10/13/21 1525   ? ? Visit Number 6   ? Number of Visits 12   ? Date for PT Re-Evaluation 10/31/21   ? Authorization Type Aetna MCR   ? Authorization Time Period FOTO V6, V10, kx mod v15   ? Progress Note Due on Visit 10   ? PT Start Time 1530   ? PT Stop Time 1612   ? PT Time Calculation (min) 42 min   ? Activity Tolerance Patient tolerated treatment well   ? Behavior During Therapy [facility] for tasks assessed/performed   ? ?  ?  ? ?  ? ? ? ? ? ? ?Past Medical History:  ?Diagnosis Date  ? Anemia   ? PMH;  Patient denies this dx as of 06/03/21  ? Anxiety   ? Asthma   ? Breast cancer ([place]) 1988  ? left breast cancer  ? Collagenous colitis   ? Compression fracture   ? lumbar 1  ? Dyspnea   ? with exertion  ? Hypertension   ? Hypothyroidism   ? Osteoarthritis   ? Osteopenia   ? Pneumonia 07/2020  ? PONV (postoperative nausea and vomiting)   ? Sleep apnea   ? wears CPAP nightly  ? ?Past Surgical History:  ?Procedure Laterality Date  ? BREAST REDUCTION SURGERY    ? Right  ? CATARACT EXTRACTION W/ INTRAOCULAR LENS  IMPLANT, BILATERAL    ? CESAREAN SECTION    ? COLONOSCOPY W/ BIOPSIES AND POLYPECTOMY    ? CYST EXCISION N/A 08/30/2018  ? Procedure: excision of posterior shoulder / back sebaceous cyst;  Surgeon: Wallace Going, DO;  Location: [place];  Service: Plastics;  Laterality: N/A;  ? KYPHOPLASTY N/A 02/18/2016  ? Procedure: LUMBAR 1 KYPHOPLASTY;  Surgeon: Phylliss Bob, MD;  Location: [place];  Service: Orthopedics;  Laterality: N/A;  LUMBAR 1 KYPHOPLASTY  ? KYPHOPLASTY N/A 06/04/2021  ? Procedure: LUMBAR 2 KYPHOPLASTY;  Surgeon: Phylliss Bob, MD;  Location: [place];  Service: Orthopedics;  Laterality: N/A;  ? MODIFIED RADICAL MASTECTOMY W/ AXILLARY LYMPH  NODE DISSECTION    ? Left  ? RECONSTRUCTION BREAST W/ LATISSIMUS DORSI FLAP    ? left  ? REDUCTION MAMMAPLASTY Right   ? right foot surgery    ? corrected hammer toe, straightened big toe  ? SEPTOPLASTY    ? TOE SURGERY    ? TONSILLECTOMY AND ADENOIDECTOMY    ? ?Patient Active Problem List  ? Diagnosis Date Noted  ? CAP (community acquired pneumonia) 09/30/2020  ? Chronic respiratory failure with hypoxia ([place]) 09/23/2020  ? Allergic rhinitis 08/31/2019  ? Pilar cyst 08/04/2018  ? History of breast cancer in female 06/09/2018  ? History of reconstruction of both breasts 06/09/2018  ? Severe obesity (BMI >= 40) ([place]) 09/16/2015  ? Asthma with acute exacerbation 09/15/2015  ? DOE (dyspnea on exertion) 01/11/2014  ? Insomnia 05/05/2012  ? Osteoarthritis   ? Osteopenia   ? Collagenous colitis   ? OSA (obstructive sleep apnea)   ? Hypothyroidism   ? Mild persistent asthma   ? Hypertension   ? Breast cancer, left breast ([place])   ? ? ?REFERRING DIAG: L chronic RCT ? ?THERAPY DIAG:  ?Chronic left shoulder pain ? ?Muscle weakness (generalized) ? ?  PERTINENT HISTORY: About 6 years ago, pt was sitting on a footstool and went to pick up a toy that had fallen to the floor and grandson began to slide out of a chair and she threw her arm up to prevent him from falling.  She heard a pop. She had therapy before that assisted.In the past couple of months, pain began again insidiously. Pt has a history of hip pain of which she has a 56 month old prescription for as well; wants to concentrate on L shoulder first. She reports having a transport w/c to assist with cooking.  ? ?PRECAUTIONS: None ? ?SUBJECTIVE: Pt reports feeling much better since starting PT, adding that she has not been woken by shoulder pain the past week. She also reports adherence to her HEP. ? ?PAIN:  ?Are you having pain? No ? ? ? ?OBJECTIVE:  ? *Unless otherwise noted, objective information collected previously* ? ?DIAGNOSTIC FINDINGS:  ?MRI 09/06/21 IMPRESSION: ?1.  Moderate tendinosis of the supraspinatus tendon with a 10 mm ?full-thickness tear and 11 mm of retraction. ?2. Moderate tendinosis of the infraspinatus tendon. ?3. Moderate tendinosis of the subscapularis tendon. ?4. Mild tendinosis of the intra-articular portion of the long head ?of the biceps tendon. ?5. Severe osteoarthritis of the left glenohumeral joint. ?  ?PATIENT SURVEYS:  ?FOTO : 54, predicted 63 ?10/13/2021: 52% ?  ?COGNITION: ?          Overall cognitive status: Within functional limits for tasks assessed ?                                 ?SENSATION: ?WFL ?  ?POSTURE: ?Dowers hump, rounded shoulders bil, forward head.  ?  ?UPPER EXTREMITY ROM:  ?  ?Active ROM-measured sitting Right ?09/19/2021 Left ?09/19/2021 Left (supine) ?10/13/2021  ?Shoulder flexion WFL 81 degrees 150 degrees with p!  ?Shoulder extension       ?Shoulder abduction WFL 40 deg with shoulder hike 160 degrees  ?Shoulder adduction       ?Shoulder internal rotation L4/5 L glute med  20 degrees  ?Shoulder external rotation T2 To L ear 60 degrees  ?Elbow flexion       ?Elbow extension       ?Wrist flexion       ?Wrist extension       ?Wrist ulnar deviation       ?Wrist radial deviation       ?Wrist pronation       ?Wrist supination       ?(Blank rows = not tested) ?  ?PROM: L shoulder supine: flexion to 120, abduction to 140, IR to approx 40, ER to approx 60 ?  ?UPPER EXTREMITY MMT: ?  ?MMT Right ?09/19/2021 Left ?09/19/2021 Right ?10/13/2021 Left ?10/13/2021  ?Shoulder flexion 3+/5 2-/5 3+/5 3/5  ?Shoulder extension        ?Shoulder abduction 3-/5 2-/5 4+/5 4+/5  ?Shoulder adduction        ?Shoulder internal rotation 3+/5 2-/5 5/5 5/5  ?Shoulder external rotation 3+/5 2-/5 5/5 4+/5  ?Middle trapezius        ?Lower trapezius        ?Elbow flexion 4+/5 3+/5 5/5 5/5  ?Elbow extension 4+/5 3/5 5/5 5/5  ?Wrist flexion        ?Wrist extension        ?Wrist ulnar deviation        ?Wrist radial deviation        ?  Wrist pronation        ?Wrist supination         ?Grip strength (lbs)        ?(Blank rows = not tested) ?  ?Cervical AROM limited 25% all planes  ?  ?SHOULDER SPECIAL TESTS: ?           Impingement tests: [place]leau/Kennedy impingement test: positive  ?Rotator cuff assessment: Drop arm test: negative, Empty can test: positive , Full can test: negative, and see diagnostic section ?  ?  ?PALPATION:  ?L supraspinatus groove can be easily palpated compared to contralateral side supine; scapulae equal ?            ?TODAY'S TREATMENT:  ? ?[facility] Adult PT Treatment:                                                DATE: 10/13/2021 ?Therapeutic Exercise: ?Standing Lt shoulder scaption stepwise 2# dumbbell lifts to shoulder-level shelf and then to overhead shelf and back down 2x6 ?Bent-over Lt shoulder pendulums with 1# dumbbell 2x30sec ?Standing BIL shoulder extension with 3# cables at Free Motion 3x10 ?Standing low rows with 23# cable 2x10 ?Standing shoulder rolls 2x10 forward and backward ?Standing Lt shoulder ER with RTB 2x10 ?Manual Therapy: ?N/A ?Neuromuscular re-ed: ?N/A ?Therapeutic Activity: ?Re-assessment of objective measures with education about progress made to this point ?Re-administration of FOTO with education ?Modalities: ?N/A ?Self Care: ?N/A ? ? ?[facility] Adult PT Treatment:                                                DATE: 10/08/2021 ?Therapeutic Exercise: ?UBE level 1 x2 minutes forward/ x2 minutes backward while collecting subjective information ?Sidelying Lt shoulder ER with 3# dumbbell 3x10 ?Standing low rows with 20# cable 3x10 ?Standing shoulder rolls 2x10 forward and backward ?Standing Lt shoulder scaption stepwise 1# dumbbell lifts to shoulder-level shelf and then to overhead shelf and back down 2x6 ?Bent-over Lt shoulder pendulums with 1# dumbbell 2x30sec ?Manual Therapy: ?N/A ?Neuromuscular re-ed: ?N/A ?Therapeutic Activity: ?N/A ?Modalities: ?[place] vasopneumatic treatment at 34d F to Lt shoulder x10 minutes with no adverse response ?Self  Care: ?N/A ? ? ?[place] Adult PT Treatment:                                                DATE: 10/06/2021 ?Therapeutic Exercise: ?NuStep arms only level 5 x5 minutes while collecting subjective information ?Standing

## 2021-10-15 ENCOUNTER — Ambulatory Visit: Payer: Medicare HMO

## 2021-10-15 DIAGNOSIS — G8929 Other chronic pain: Secondary | ICD-10-CM

## 2021-10-15 DIAGNOSIS — M25512 Pain in left shoulder: Secondary | ICD-10-CM | POA: Diagnosis not present

## 2021-10-15 DIAGNOSIS — M6281 Muscle weakness (generalized): Secondary | ICD-10-CM

## 2021-10-15 NOTE — Therapy (Signed)
?OUTPATIENT PHYSICAL THERAPY TREATMENT NOTE ? ? ?Patient Name: [name] ?MRN: [id number] ?DOB:06-01-1941, 81 y.o., female ?Today's Date: 10/15/2021 ? ?PCP: Aura Dials, PA-C ?REFERRING PROVIDER: Melrose Nakayama, MD ? ? PT End of Session - 10/15/21 1357   ? ? Visit Number 7   ? Number of Visits 12   ? Date for PT Re-Evaluation 10/31/21   ? Authorization Type Aetna MCR   ? Authorization Time Period FOTO V6, V10, kx mod v15   ? Progress Note Due on Visit 10   ? PT Start Time 1400   ? PT Stop Time 0932   ? PT Time Calculation (min) 45 min   ? Activity Tolerance Patient tolerated treatment well   ? Behavior During Therapy [facility] for tasks assessed/performed   ? ?  ?  ? ?  ? ? ? ? ? ? ? ?Past Medical History:  ?Diagnosis Date  ? Anemia   ? PMH;  Patient denies this dx as of 06/03/21  ? Anxiety   ? Asthma   ? Breast cancer ([place]) 1988  ? left breast cancer  ? Collagenous colitis   ? Compression fracture   ? lumbar 1  ? Dyspnea   ? with exertion  ? Hypertension   ? Hypothyroidism   ? Osteoarthritis   ? Osteopenia   ? Pneumonia 07/2020  ? PONV (postoperative nausea and vomiting)   ? Sleep apnea   ? wears CPAP nightly  ? ?Past Surgical History:  ?Procedure Laterality Date  ? BREAST REDUCTION SURGERY    ? Right  ? CATARACT EXTRACTION W/ INTRAOCULAR LENS  IMPLANT, BILATERAL    ? CESAREAN SECTION    ? COLONOSCOPY W/ BIOPSIES AND POLYPECTOMY    ? CYST EXCISION N/A 08/30/2018  ? Procedure: excision of posterior shoulder / back sebaceous cyst;  Surgeon: Wallace Going, DO;  Location: [place];  Service: Plastics;  Laterality: N/A;  ? KYPHOPLASTY N/A 02/18/2016  ? Procedure: LUMBAR 1 KYPHOPLASTY;  Surgeon: Phylliss Bob, MD;  Location: [place];  Service: Orthopedics;  Laterality: N/A;  LUMBAR 1 KYPHOPLASTY  ? KYPHOPLASTY N/A 06/04/2021  ? Procedure: LUMBAR 2 KYPHOPLASTY;  Surgeon: Phylliss Bob, MD;  Location: [place];  Service: Orthopedics;  Laterality: N/A;  ? MODIFIED RADICAL MASTECTOMY W/ AXILLARY LYMPH  NODE DISSECTION    ? Left  ? RECONSTRUCTION BREAST W/ LATISSIMUS DORSI FLAP    ? left  ? REDUCTION MAMMAPLASTY Right   ? right foot surgery    ? corrected hammer toe, straightened big toe  ? SEPTOPLASTY    ? TOE SURGERY    ? TONSILLECTOMY AND ADENOIDECTOMY    ? ?Patient Active Problem List  ? Diagnosis Date Noted  ? CAP (community acquired pneumonia) 09/30/2020  ? Chronic respiratory failure with hypoxia ([place]) 09/23/2020  ? Allergic rhinitis 08/31/2019  ? Pilar cyst 08/04/2018  ? History of breast cancer in female 06/09/2018  ? History of reconstruction of both breasts 06/09/2018  ? Severe obesity (BMI >= 40) ([place]) 09/16/2015  ? Asthma with acute exacerbation 09/15/2015  ? DOE (dyspnea on exertion) 01/11/2014  ? Insomnia 05/05/2012  ? Osteoarthritis   ? Osteopenia   ? Collagenous colitis   ? OSA (obstructive sleep apnea)   ? Hypothyroidism   ? Mild persistent asthma   ? Hypertension   ? Breast cancer, left breast ([place])   ? ? ?REFERRING DIAG: L chronic RCT ? ?THERAPY DIAG:  ?Chronic left shoulder pain ? ?Muscle weakness (generalized) ? ?  PERTINENT HISTORY: About 6 years ago, pt was sitting on a footstool and went to pick up a toy that had fallen to the floor and grandson began to slide out of a chair and she threw her arm up to prevent him from falling.  She heard a pop. She had therapy before that assisted.In the past couple of months, pain began again insidiously. Pt has a history of hip pain of which she has a 86 month old prescription for as well; wants to concentrate on L shoulder first. She reports having a transport w/c to assist with cooking.  ? ?PRECAUTIONS: None ? ?SUBJECTIVE: Pt reports no shoulder pain today, although she reports some minor soreness lasting a day following last session. She reports adherence to her HEP. ? ?PAIN:  ?Are you having pain? No ? ? ? ?OBJECTIVE:  ? *Unless otherwise noted, objective information collected previously* ? ?DIAGNOSTIC FINDINGS:  ?MRI 09/06/21 IMPRESSION: ?1. Moderate  tendinosis of the supraspinatus tendon with a 10 mm ?full-thickness tear and 11 mm of retraction. ?2. Moderate tendinosis of the infraspinatus tendon. ?3. Moderate tendinosis of the subscapularis tendon. ?4. Mild tendinosis of the intra-articular portion of the long head ?of the biceps tendon. ?5. Severe osteoarthritis of the left glenohumeral joint. ?  ?PATIENT SURVEYS:  ?FOTO : 30, predicted 63 ?10/13/2021: 52% ?  ?COGNITION: ?          Overall cognitive status: Within functional limits for tasks assessed ?                                 ?SENSATION: ?WFL ?  ?POSTURE: ?Dowers hump, rounded shoulders bil, forward head.  ?  ?UPPER EXTREMITY ROM:  ?  ?Active ROM-measured sitting Right ?09/19/2021 Left ?09/19/2021 Left (supine) ?10/13/2021  ?Shoulder flexion WFL 81 degrees 150 degrees with p!  ?Shoulder extension       ?Shoulder abduction WFL 40 deg with shoulder hike 160 degrees  ?Shoulder adduction       ?Shoulder internal rotation L4/5 L glute med  20 degrees  ?Shoulder external rotation T2 To L ear 60 degrees  ?Elbow flexion       ?Elbow extension       ?Wrist flexion       ?Wrist extension       ?Wrist ulnar deviation       ?Wrist radial deviation       ?Wrist pronation       ?Wrist supination       ?(Blank rows = not tested) ?  ?PROM: L shoulder supine: flexion to 120, abduction to 140, IR to approx 40, ER to approx 60 ?  ?UPPER EXTREMITY MMT: ?  ?MMT Right ?09/19/2021 Left ?09/19/2021 Right ?10/13/2021 Left ?10/13/2021  ?Shoulder flexion 3+/5 2-/5 3+/5 3/5  ?Shoulder extension        ?Shoulder abduction 3-/5 2-/5 4+/5 4+/5  ?Shoulder adduction        ?Shoulder internal rotation 3+/5 2-/5 5/5 5/5  ?Shoulder external rotation 3+/5 2-/5 5/5 4+/5  ?Middle trapezius        ?Lower trapezius        ?Elbow flexion 4+/5 3+/5 5/5 5/5  ?Elbow extension 4+/5 3/5 5/5 5/5  ?Wrist flexion        ?Wrist extension        ?Wrist ulnar deviation        ?Wrist radial deviation        ?Wrist pronation        ?  Wrist supination        ?Grip  strength (lbs)        ?(Blank rows = not tested) ?  ?Cervical AROM limited 25% all planes  ?  ?SHOULDER SPECIAL TESTS: ?           Impingement tests: [place]/Kennedy impingement test: positive  ?Rotator cuff assessment: Drop arm test: negative, Empty can test: positive , Full can test: negative, and see diagnostic section ?  ?  ?PALPATION:  ?L supraspinatus groove can be easily palpated compared to contralateral side supine; scapulae equal ?            ?TODAY'S TREATMENT:  ? ?[facility] Adult PT Treatment:                                                DATE: 10/15/2021 ?Therapeutic Exercise: ?UBE level 2, x2 minutes forward/ x2 minutes backward while collecting subjective information ?Standing Single Arm Rear Delt Fly with YTB 2x10 BIL ?Standing Lt shoulder scaption stepwise 2# dumbbell lifts to shoulder-level shelf and then to overhead shelf and back down 2x6 ?Bent-over Lt shoulder pendulums with 1# dumbbell 2x30sec ?Mini-squat into BIL shoulder flexion swing with 8# GAIAM ball and slow eccentric shoulder lowering 2x8 ?Standing BIL shoulder extension with 3# cables at Free Motion 3x10 ?3kg ball roll up wall with Lt shoulder x5 with minor PT assistance ?Manual Therapy: ?N/A ?Neuromuscular re-ed: ?N/A ?Therapeutic Activity: ?N/A ?Modalities: ?N/A ?Self Care: ?N/A ? ? ?[facility] Adult PT Treatment:                                                DATE: 10/13/2021 ?Therapeutic Exercise: ?Standing Lt shoulder scaption stepwise 2# dumbbell lifts to shoulder-level shelf and then to overhead shelf and back down 2x6 ?Bent-over Lt shoulder pendulums with 1# dumbbell 2x30sec ?Standing BIL shoulder extension with 3# cables at Free Motion 3x10 ?Standing low rows with 23# cable 2x10 ?Standing shoulder rolls 2x10 forward and backward ?Standing Lt shoulder ER with RTB 2x10 ?Manual Therapy: ?N/A ?Neuromuscular re-ed: ?N/A ?Therapeutic Activity: ?Re-assessment of objective measures with education about progress made to this  point ?Re-administration of FOTO with education ?Modalities: ?N/A ?Self Care: ?N/A ? ? ?[facility] Adult PT Treatment:                                                DATE: 10/08/2021 ?Therapeutic Exercise: ?UBE level 1 x2 minutes forwar

## 2021-10-20 ENCOUNTER — Ambulatory Visit: Payer: Medicare HMO

## 2021-10-20 DIAGNOSIS — M6281 Muscle weakness (generalized): Secondary | ICD-10-CM

## 2021-10-20 DIAGNOSIS — M25512 Pain in left shoulder: Secondary | ICD-10-CM | POA: Diagnosis not present

## 2021-10-20 DIAGNOSIS — G8929 Other chronic pain: Secondary | ICD-10-CM

## 2021-10-20 NOTE — Therapy (Signed)
?OUTPATIENT PHYSICAL THERAPY TREATMENT NOTE ? ? ?Patient Name: [name] ?MRN: [id number] ?DOB:01/28/1941, 81 y.o., female ?Today's Date: 10/20/2021 ? ?PCP: Aura Dials, PA-C ?REFERRING PROVIDER: Aura Dials, PA-C ? ? PT End of Session - 10/20/21 1353   ? ? Visit Number 8   ? Number of Visits 12   ? Date for PT Re-Evaluation 10/31/21   ? Authorization Type Aetna MCR   ? Authorization Time Period FOTO V6, V10, kx mod v15   ? Progress Note Due on Visit 10   ? PT Start Time 1355   ? PT Stop Time 1450   10 minutes vasopneumatic treatment  ? PT Time Calculation (min) 55 min   ? Activity Tolerance Patient tolerated treatment well   ? Behavior During Therapy [facility] for tasks assessed/performed   ? ?  ?  ? ?  ? ? ? ? ? ? ? ? ?Past Medical History:  ?Diagnosis Date  ? Anemia   ? PMH;  Patient denies this dx as of 06/03/21  ? Anxiety   ? Asthma   ? Breast cancer ([place]) 1988  ? left breast cancer  ? Collagenous colitis   ? Compression fracture   ? lumbar 1  ? Dyspnea   ? with exertion  ? Hypertension   ? Hypothyroidism   ? Osteoarthritis   ? Osteopenia   ? Pneumonia 07/2020  ? PONV (postoperative nausea and vomiting)   ? Sleep apnea   ? wears CPAP nightly  ? ?Past Surgical History:  ?Procedure Laterality Date  ? BREAST REDUCTION SURGERY    ? Right  ? CATARACT EXTRACTION W/ INTRAOCULAR LENS  IMPLANT, BILATERAL    ? CESAREAN SECTION    ? COLONOSCOPY W/ BIOPSIES AND POLYPECTOMY    ? CYST EXCISION N/A 08/30/2018  ? Procedure: excision of posterior shoulder / back sebaceous cyst;  Surgeon: Wallace Going, DO;  Location: [place];  Service: Plastics;  Laterality: N/A;  ? KYPHOPLASTY N/A 02/18/2016  ? Procedure: LUMBAR 1 KYPHOPLASTY;  Surgeon: Phylliss Bob, MD;  Location: [place];  Service: Orthopedics;  Laterality: N/A;  LUMBAR 1 KYPHOPLASTY  ? KYPHOPLASTY N/A 06/04/2021  ? Procedure: LUMBAR 2 KYPHOPLASTY;  Surgeon: Phylliss Bob, MD;  Location: [place];  Service: Orthopedics;  Laterality: N/A;  ? MODIFIED  RADICAL MASTECTOMY W/ AXILLARY LYMPH NODE DISSECTION    ? Left  ? RECONSTRUCTION BREAST W/ LATISSIMUS DORSI FLAP    ? left  ? REDUCTION MAMMAPLASTY Right   ? right foot surgery    ? corrected hammer toe, straightened big toe  ? SEPTOPLASTY    ? TOE SURGERY    ? TONSILLECTOMY AND ADENOIDECTOMY    ? ?Patient Active Problem List  ? Diagnosis Date Noted  ? CAP (community acquired pneumonia) 09/30/2020  ? Chronic respiratory failure with hypoxia ([place]) 09/23/2020  ? Allergic rhinitis 08/31/2019  ? Pilar cyst 08/04/2018  ? History of breast cancer in female 06/09/2018  ? History of reconstruction of both breasts 06/09/2018  ? Severe obesity (BMI >= 40) ([place]) 09/16/2015  ? Asthma with acute exacerbation 09/15/2015  ? DOE (dyspnea on exertion) 01/11/2014  ? Insomnia 05/05/2012  ? Osteoarthritis   ? Osteopenia   ? Collagenous colitis   ? OSA (obstructive sleep apnea)   ? Hypothyroidism   ? Mild persistent asthma   ? Hypertension   ? Breast cancer, left breast ([place])   ? ? ?REFERRING DIAG: L chronic RCT ? ?THERAPY DIAG:  ?Chronic left  shoulder pain ? ?Muscle weakness (generalized) ? ?PERTINENT HISTORY: About 6 years ago, pt was sitting on a footstool and went to pick up a toy that had fallen to the floor and grandson began to slide out of a chair and she threw her arm up to prevent him from falling.  She heard a pop. She had therapy before that assisted.In the past couple of months, pain began again insidiously. Pt has a history of hip pain of which she has a 37 month old prescription for as well; wants to concentrate on L shoulder first. She reports having a transport w/c to assist with cooking.  ? ?PRECAUTIONS: None ? ?SUBJECTIVE: Pt reports no pain today, adding that she continues to see improvement in her shoulder symptoms. She reports it isn't perfect yet, but she describes her sxs as "really good." ? ?PAIN:  ?Are you having pain? No ? ? ? ?OBJECTIVE:  ? *Unless otherwise noted, objective information collected  previously* ? ?DIAGNOSTIC FINDINGS:  ?MRI 09/06/21 IMPRESSION: ?1. Moderate tendinosis of the supraspinatus tendon with a 10 mm ?full-thickness tear and 11 mm of retraction. ?2. Moderate tendinosis of the infraspinatus tendon. ?3. Moderate tendinosis of the subscapularis tendon. ?4. Mild tendinosis of the intra-articular portion of the long head ?of the biceps tendon. ?5. Severe osteoarthritis of the left glenohumeral joint. ?  ?PATIENT SURVEYS:  ?FOTO : 35, predicted 63 ?10/13/2021: 52% ?  ?COGNITION: ?          Overall cognitive status: Within functional limits for tasks assessed ?                                 ?SENSATION: ?WFL ?  ?POSTURE: ?Dowers hump, rounded shoulders bil, forward head.  ?  ?UPPER EXTREMITY ROM:  ?  ?Active ROM-measured sitting Right ?09/19/2021 Left ?09/19/2021 Left (supine) ?10/13/2021  ?Shoulder flexion WFL 81 degrees 150 degrees with p!  ?Shoulder extension       ?Shoulder abduction WFL 40 deg with shoulder hike 160 degrees  ?Shoulder adduction       ?Shoulder internal rotation L4/5 L glute med  20 degrees  ?Shoulder external rotation T2 To L ear 60 degrees  ?Elbow flexion       ?Elbow extension       ?Wrist flexion       ?Wrist extension       ?Wrist ulnar deviation       ?Wrist radial deviation       ?Wrist pronation       ?Wrist supination       ?(Blank rows = not tested) ?  ?PROM: L shoulder supine: flexion to 120, abduction to 140, IR to approx 40, ER to approx 60 ?  ?UPPER EXTREMITY MMT: ?  ?MMT Right ?09/19/2021 Left ?09/19/2021 Right ?10/13/2021 Left ?10/13/2021  ?Shoulder flexion 3+/5 2-/5 3+/5 3/5  ?Shoulder extension        ?Shoulder abduction 3-/5 2-/5 4+/5 4+/5  ?Shoulder adduction        ?Shoulder internal rotation 3+/5 2-/5 5/5 5/5  ?Shoulder external rotation 3+/5 2-/5 5/5 4+/5  ?Middle trapezius        ?Lower trapezius        ?Elbow flexion 4+/5 3+/5 5/5 5/5  ?Elbow extension 4+/5 3/5 5/5 5/5  ?Wrist flexion        ?Wrist extension        ?Wrist ulnar deviation        ?  Wrist  radial deviation        ?Wrist pronation        ?Wrist supination        ?Grip strength (lbs)        ?(Blank rows = not tested) ?  ?Cervical AROM limited 25% all planes  ?  ?SHOULDER SPECIAL TESTS: ?           Impingement tests: Edmunds/Kennedy impingement test: positive  ?Rotator cuff assessment: Drop arm test: negative, Empty can test: positive , Full can test: negative, and see diagnostic section ?  ?  ?PALPATION:  ?L supraspinatus groove can be easily palpated compared to contralateral side supine; scapulae equal ?            ?TODAY'S TREATMENT:  ? ?[facility] Adult PT Treatment:                                                DATE: 10/20/2021 ?Therapeutic Exercise: ?Glute bridge with 10# kettlebell 3x10 with 3-sec hold ?Seated BIL shoulder scaption with 2# dumbbells 3x8 ?Supine shoulder flexion AAROM with wooden dowel and 2# ankle weight 2x10 with 10-sec hold ?Supine shoulder protraction with wooden dowel with 2# ankle weight 2x15 ?Seated shoulder rolls 2x10 forward and backward ?Standing BIL shoulder extension with two 3# cables 2x10 ?Manual Therapy: ?N/A ?Neuromuscular re-ed: ?N/A ?Therapeutic Activity: ?N/A ?Modalities: ?GameReady vasopneumatic treatment to Lt shoulder at 34d F x10 minutes with no adverse response ?Self Care: ?N/A ? ? ?[place] Adult PT Treatment:                                                DATE: 10/15/2021 ?Therapeutic Exercise: ?UBE level 2, x2 minutes forward/ x2 minutes backward while collecting subjective information ?Standing Single Arm Rear Delt Fly with YTB 2x10 BIL ?Standing Lt shoulder scaption stepwise 2# dumbbell lifts to shoulder-level shelf and then to overhead shelf and back down 2x6 ?Bent-over Lt shoulder pendulums with 1# dumbbell 2x30sec ?Mini-squat into BIL shoulder flexion swing with 8# GAIAM ball and slow eccentric shoulder lowering 2x8 ?Standing BIL shoulder extension with 3# cables at Free Motion 3x10 ?3kg ball roll up wall with Lt shoulder x5 with minor PT assistance ?Manual  Therapy: ?N/A ?Neuromuscular re-ed: ?N/A ?Therapeutic Activity: ?N/A ?Modalities: ?N/A ?Self Care: ?N/A ? ? ?[place] Adult PT Treatment:                                                DATE: 10/13/2021 ?Therapeutic Exercise:

## 2021-10-22 ENCOUNTER — Ambulatory Visit: Payer: Medicare HMO

## 2021-10-22 DIAGNOSIS — G8929 Other chronic pain: Secondary | ICD-10-CM

## 2021-10-22 DIAGNOSIS — M25512 Pain in left shoulder: Secondary | ICD-10-CM | POA: Diagnosis not present

## 2021-10-22 DIAGNOSIS — M6281 Muscle weakness (generalized): Secondary | ICD-10-CM

## 2021-10-22 NOTE — Therapy (Signed)
?OUTPATIENT PHYSICAL THERAPY TREATMENT NOTE ? ? ?Patient Name: [name] ?MRN: [id number] ?DOB:12-Sep-1940, 81 y.o., female ?Today's Date: 10/22/2021 ? ?PCP: Aura Dials, PA-C ?REFERRING PROVIDER: Melrose Nakayama, MD ? ? PT End of Session - 10/22/21 1356   ? ? Visit Number 9   ? Number of Visits 12   ? Date for PT Re-Evaluation 10/31/21   ? Authorization Type Aetna MCR   ? Authorization Time Period FOTO V6, V10, kx mod v15   ? Progress Note Due on Visit 10   ? PT Start Time 1358   ? PT Stop Time 2620   ? PT Time Calculation (min) 45 min   ? Activity Tolerance Patient tolerated treatment well   ? Behavior During Therapy [facility] for tasks assessed/performed   ? ?  ?  ? ?  ? ? ? ? ? ? ? ? ? ?Past Medical History:  ?Diagnosis Date  ? Anemia   ? PMH;  Patient denies this dx as of 06/03/21  ? Anxiety   ? Asthma   ? Breast cancer ([place]) 1988  ? left breast cancer  ? Collagenous colitis   ? Compression fracture   ? lumbar 1  ? Dyspnea   ? with exertion  ? Hypertension   ? Hypothyroidism   ? Osteoarthritis   ? Osteopenia   ? Pneumonia 07/2020  ? PONV (postoperative nausea and vomiting)   ? Sleep apnea   ? wears CPAP nightly  ? ?Past Surgical History:  ?Procedure Laterality Date  ? BREAST REDUCTION SURGERY    ? Right  ? CATARACT EXTRACTION W/ INTRAOCULAR LENS  IMPLANT, BILATERAL    ? CESAREAN SECTION    ? COLONOSCOPY W/ BIOPSIES AND POLYPECTOMY    ? CYST EXCISION N/A 08/30/2018  ? Procedure: excision of posterior shoulder / back sebaceous cyst;  Surgeon: Wallace Going, DO;  Location: [place];  Service: Plastics;  Laterality: N/A;  ? KYPHOPLASTY N/A 02/18/2016  ? Procedure: LUMBAR 1 KYPHOPLASTY;  Surgeon: Phylliss Bob, MD;  Location: [place];  Service: Orthopedics;  Laterality: N/A;  LUMBAR 1 KYPHOPLASTY  ? KYPHOPLASTY N/A 06/04/2021  ? Procedure: LUMBAR 2 KYPHOPLASTY;  Surgeon: Phylliss Bob, MD;  Location: [place];  Service: Orthopedics;  Laterality: N/A;  ? MODIFIED RADICAL MASTECTOMY W/ AXILLARY  LYMPH NODE DISSECTION    ? Left  ? RECONSTRUCTION BREAST W/ LATISSIMUS DORSI FLAP    ? left  ? REDUCTION MAMMAPLASTY Right   ? right foot surgery    ? corrected hammer toe, straightened big toe  ? SEPTOPLASTY    ? TOE SURGERY    ? TONSILLECTOMY AND ADENOIDECTOMY    ? ?Patient Active Problem List  ? Diagnosis Date Noted  ? CAP (community acquired pneumonia) 09/30/2020  ? Chronic respiratory failure with hypoxia ([place]) 09/23/2020  ? Allergic rhinitis 08/31/2019  ? Pilar cyst 08/04/2018  ? History of breast cancer in female 06/09/2018  ? History of reconstruction of both breasts 06/09/2018  ? Severe obesity (BMI >= 40) ([place]) 09/16/2015  ? Asthma with acute exacerbation 09/15/2015  ? DOE (dyspnea on exertion) 01/11/2014  ? Insomnia 05/05/2012  ? Osteoarthritis   ? Osteopenia   ? Collagenous colitis   ? OSA (obstructive sleep apnea)   ? Hypothyroidism   ? Mild persistent asthma   ? Hypertension   ? Breast cancer, left breast ([place])   ? ? ?REFERRING DIAG: L chronic RCT ? ?THERAPY DIAG:  ?Chronic left shoulder pain ? ?Muscle weakness (  generalized) ? ?PERTINENT HISTORY: About 6 years ago, pt was sitting on a footstool and went to pick up a toy that had fallen to the floor and grandson began to slide out of a chair and she threw her arm up to prevent him from falling.  She heard a pop. She had therapy before that assisted.In the past couple of months, pain began again insidiously. Pt has a history of hip pain of which she has a 65 month old prescription for as well; wants to concentrate on L shoulder first. She reports having a transport w/c to assist with cooking.  ? ?PRECAUTIONS: None ? ?SUBJECTIVE: Pt reports that she stubbed her Rt 2nd toe this morning, and it is hurting today. Otherwise, she reports no shoulder pain or hip pain today. She reports increased soreness after last visit only lasting that day.  ? ?PAIN:  ?Are you having pain? No ? ? ? ?OBJECTIVE:  ? *Unless otherwise noted, objective information collected  previously* ? ?DIAGNOSTIC FINDINGS:  ?MRI 09/06/21 IMPRESSION: ?1. Moderate tendinosis of the supraspinatus tendon with a 10 mm ?full-thickness tear and 11 mm of retraction. ?2. Moderate tendinosis of the infraspinatus tendon. ?3. Moderate tendinosis of the subscapularis tendon. ?4. Mild tendinosis of the intra-articular portion of the long head ?of the biceps tendon. ?5. Severe osteoarthritis of the left glenohumeral joint. ?  ?PATIENT SURVEYS:  ?FOTO : 57, predicted 63 ?10/13/2021: 52% ?  ?COGNITION: ?          Overall cognitive status: Within functional limits for tasks assessed ?                                 ?SENSATION: ?WFL ?  ?POSTURE: ?Dowers hump, rounded shoulders bil, forward head.  ?  ?UPPER EXTREMITY ROM:  ?  ?Active ROM-measured sitting Right ?09/19/2021 Left ?09/19/2021 Left (supine) ?10/13/2021  ?Shoulder flexion WFL 81 degrees 150 degrees with p!  ?Shoulder extension       ?Shoulder abduction WFL 40 deg with shoulder hike 160 degrees  ?Shoulder adduction       ?Shoulder internal rotation L4/5 L glute med  20 degrees  ?Shoulder external rotation T2 To L ear 60 degrees  ?Elbow flexion       ?Elbow extension       ?Wrist flexion       ?Wrist extension       ?Wrist ulnar deviation       ?Wrist radial deviation       ?Wrist pronation       ?Wrist supination       ?(Blank rows = not tested) ?  ?PROM: L shoulder supine: flexion to 120, abduction to 140, IR to approx 40, ER to approx 60 ?  ?UPPER EXTREMITY MMT: ?  ?MMT Right ?09/19/2021 Left ?09/19/2021 Right ?10/13/2021 Left ?10/13/2021  ?Shoulder flexion 3+/5 2-/5 3+/5 3/5  ?Shoulder extension        ?Shoulder abduction 3-/5 2-/5 4+/5 4+/5  ?Shoulder adduction        ?Shoulder internal rotation 3+/5 2-/5 5/5 5/5  ?Shoulder external rotation 3+/5 2-/5 5/5 4+/5  ?Middle trapezius        ?Lower trapezius        ?Elbow flexion 4+/5 3+/5 5/5 5/5  ?Elbow extension 4+/5 3/5 5/5 5/5  ?Wrist flexion        ?Wrist extension        ?Wrist ulnar deviation        ?  Wrist  radial deviation        ?Wrist pronation        ?Wrist supination        ?Grip strength (lbs)        ?(Blank rows = not tested) ?  ?Cervical AROM limited 25% all planes  ?  ?SHOULDER SPECIAL TESTS: ?           Impingement tests: Takach/Kennedy impingement test: positive  ?Rotator cuff assessment: Drop arm test: negative, Empty can test: positive , Full can test: negative, and see diagnostic section ?  ?  ?PALPATION:  ?L supraspinatus groove can be easily palpated compared to contralateral side supine; scapulae equal ?            ?TODAY'S TREATMENT:  ? ?[facility] Adult PT Treatment:                                                DATE: 10/22/2021 ?Therapeutic Exercise: ?Seated low rows with 25# 2x10 ?Seated high rows with 25# 2x10 ?Seated lat pull-down with 10# cable 2x10 ?Seated shoulder rolls 2x10 forward and backward ?Standing shoulder scaption with 2# dumbbells 3x8 ?Standing BIL shoulder extension with 3# cables 3x10 ?Manual Therapy: ?N/A ?Neuromuscular re-ed: ?N/A ?Therapeutic Activity: ?N/A ?Modalities: ?N/A ?Self Care: ?N/A ? ? ?[place] Adult PT Treatment:                                                DATE: 10/20/2021 ?Therapeutic Exercise: ?Glute bridge with 10# kettlebell 3x10 with 3-sec hold ?Seated BIL shoulder scaption with 2# dumbbells 3x8 ?Supine shoulder flexion AAROM with wooden dowel and 2# ankle weight 2x10 with 10-sec hold ?Supine shoulder protraction with wooden dowel with 2# ankle weight 2x15 ?Seated shoulder rolls 2x10 forward and backward ?Standing BIL shoulder extension with two 3# cables 2x10 ?Manual Therapy: ?N/A ?Neuromuscular re-ed: ?N/A ?Therapeutic Activity: ?N/A ?Modalities: ?GameReady vasopneumatic treatment to Lt shoulder at 34d F x10 minutes with no adverse response ?Self Care: ?N/A ? ? ?[facility] Adult PT Treatment:                                                DATE: 10/15/2021 ?Therapeutic Exercise: ?UBE level 2, x2 minutes forward/ x2 minutes backward while collecting subjective  information ?Standing Single Arm Rear Delt Fly with YTB 2x10 BIL ?Standing Lt shoulder scaption stepwise 2# dumbbell lifts to shoulder-level shelf and then to overhead shelf and back down 2x6 ?Bent-over Lt shoulder pendulums with

## 2021-11-13 NOTE — Therapy (Signed)
?OUTPATIENT PHYSICAL THERAPY TREATMENT NOTE/ RE-EVALUATION ? ?Progress Note ?Reporting Period 09/19/2021 to 11/14/2021 ? ? ?See note below for Objective Data and Assessment of Progress/Goals.  ? ? ? ? ?Patient Name: [name] ?MRN: [id number] ?DOB:[birth year], 81 y.o., female ?Today's Date: 11/14/2021 ? ?PCP: Aura Dials, PA-C ?REFERRING PROVIDER: Aura Dials, PA-C ? ? PT End of Session - 11/14/21 1103   ? ? Visit Number 10   ? Number of Visits 18   ? Date for PT Re-Evaluation 01/16/22   ? Authorization Type Aetna MCR   ? Authorization Time Period FOTO V6, V10, kx mod v15   ? Progress Note Due on Visit 20   ? PT Start Time 1045   Pt arrived 15 minutes late  ? PT Stop Time 1115   ? PT Time Calculation (min) 30 min   ? Activity Tolerance Patient tolerated treatment well   ? Behavior During Therapy [facility] for tasks assessed/performed   ? ?  ?  ? ?  ? ? ? ? ? ? ? ? ? ? ?Past Medical History:  ?Diagnosis Date  ? Anemia   ? PMH;  Patient denies this dx as of 06/03/21  ? Anxiety   ? Asthma   ? Breast cancer ([place]) 1988  ? left breast cancer  ? Collagenous colitis   ? Compression fracture   ? lumbar 1  ? Dyspnea   ? with exertion  ? Hypertension   ? Hypothyroidism   ? Osteoarthritis   ? Osteopenia   ? Pneumonia 07/2020  ? PONV (postoperative nausea and vomiting)   ? Sleep apnea   ? wears CPAP nightly  ? ?Past Surgical History:  ?Procedure Laterality Date  ? BREAST REDUCTION SURGERY    ? Right  ? CATARACT EXTRACTION W/ INTRAOCULAR LENS  IMPLANT, BILATERAL    ? CESAREAN SECTION    ? COLONOSCOPY W/ BIOPSIES AND POLYPECTOMY    ? CYST EXCISION N/A 08/30/2018  ? Procedure: excision of posterior shoulder / back sebaceous cyst;  Surgeon: Wallace Going, DO;  Location: [place];  Service: Plastics;  Laterality: N/A;  ? KYPHOPLASTY N/A 02/18/2016  ? Procedure: LUMBAR 1 KYPHOPLASTY;  Surgeon: Phylliss Bob, MD;  Location: [place];  Service: Orthopedics;  Laterality: N/A;  LUMBAR 1 KYPHOPLASTY  ? KYPHOPLASTY  N/A 06/04/2021  ? Procedure: LUMBAR 2 KYPHOPLASTY;  Surgeon: Phylliss Bob, MD;  Location: [place];  Service: Orthopedics;  Laterality: N/A;  ? MODIFIED RADICAL MASTECTOMY W/ AXILLARY LYMPH NODE DISSECTION    ? Left  ? RECONSTRUCTION BREAST W/ LATISSIMUS DORSI FLAP    ? left  ? REDUCTION MAMMAPLASTY Right   ? right foot surgery    ? corrected hammer toe, straightened big toe  ? SEPTOPLASTY    ? TOE SURGERY    ? TONSILLECTOMY AND ADENOIDECTOMY    ? ?Patient Active Problem List  ? Diagnosis Date Noted  ? CAP (community acquired pneumonia) 09/30/2020  ? Chronic respiratory failure with hypoxia ([place]) 09/23/2020  ? Allergic rhinitis 08/31/2019  ? Pilar cyst 08/04/2018  ? History of breast cancer in female 06/09/2018  ? History of reconstruction of both breasts 06/09/2018  ? Severe obesity (BMI >= 40) ([place]) 09/16/2015  ? Asthma with acute exacerbation 09/15/2015  ? DOE (dyspnea on exertion) 01/11/2014  ? Insomnia 05/05/2012  ? Osteoarthritis   ? Osteopenia   ? Collagenous colitis   ? OSA (obstructive sleep apnea)   ? Hypothyroidism   ? Mild persistent asthma   ?  Hypertension   ? Breast cancer, left breast ([place])   ? ? ?REFERRING DIAG: L chronic RCT ? ?THERAPY DIAG:  ?Chronic left shoulder pain ? ?Muscle weakness (generalized) ? ?PERTINENT HISTORY: About 6 years ago, pt was sitting on a footstool and went to pick up a toy that had fallen to the floor and grandson began to slide out of a chair and she threw her arm up to prevent him from falling.  She heard a pop. She had therapy before that assisted.In the past couple of months, pain began again insidiously. Pt has a history of hip pain of which she has a 32 month old prescription for as well; wants to concentrate on L shoulder first. She reports having a transport w/c to assist with cooking.  ? ?PRECAUTIONS: None ? ?SUBJECTIVE: Pt reports that her Lt shoulder has been doing much better, although she still has baseline Rt hip pain. She reports this is exacerbated with being  active through the day, although she reports 0/10 pain currently. Pt reports that she would like to continue PT with her hip as the primary focus.  ? ?PAIN:  ?Are you having pain? No ? ? ? ?OBJECTIVE:  ? *Unless otherwise noted, objective information collected previously* ? ?DIAGNOSTIC FINDINGS:  ?MRI 09/06/21 IMPRESSION: ?1. Moderate tendinosis of the supraspinatus tendon with a 10 mm ?full-thickness tear and 11 mm of retraction. ?2. Moderate tendinosis of the infraspinatus tendon. ?3. Moderate tendinosis of the subscapularis tendon. ?4. Mild tendinosis of the intra-articular portion of the long head ?of the biceps tendon. ?5. Severe osteoarthritis of the left glenohumeral joint. ?  ?PATIENT SURVEYS:  ?FOTO : 79, predicted 63 ?10/13/2021: 52% ?11/14/2021: 66% (goal met) ?  ?COGNITION: ?          Overall cognitive status: Within functional limits for tasks assessed ?                                 ?SENSATION: ?WFL ?  ?POSTURE: ?Dowers hump, rounded shoulders bil, forward head.  ?  ?UPPER EXTREMITY ROM:  ?  ?Active ROM-measured sitting Right ?09/19/2021 Left ?09/19/2021 Left (supine) ?10/13/2021  ?Shoulder flexion WFL 81 degrees 150 degrees with p!  ?Shoulder extension       ?Shoulder abduction WFL 40 deg with shoulder hike 160 degrees  ?Shoulder adduction       ?Shoulder internal rotation L4/5 L glute med  20 degrees  ?Shoulder external rotation T2 To L ear 60 degrees  ?Elbow flexion       ?Elbow extension       ?Wrist flexion       ?Wrist extension       ?Wrist ulnar deviation       ?Wrist radial deviation       ?Wrist pronation       ?Wrist supination       ?(Blank rows = not tested) ?  ?PROM: L shoulder supine: flexion to 120, abduction to 140, IR to approx 40, ER to approx 60 ?  ?UPPER EXTREMITY MMT: ?  ?MMT Right ?09/19/2021 Left ?09/19/2021 Right ?10/13/2021 Left ?10/13/2021 Right ?11/14/2021 Left ?11/14/2021  ?Shoulder flexion 3+/5 2-/5 3+/5 3/5 4+/5 4+/5  ?Shoulder extension          ?Shoulder abduction 3-/5 2-/5 4+/5  4+/5 5/5 5/5  ?Shoulder adduction          ?Shoulder internal rotation 3+/5 2-/5 5/5 5/5 5/5 5/5  ?Shoulder  external rotation 3+/5 2-/5 5/5 4+/5 5/5 5/5  ?Middle trapezius          ?Lower trapezius          ?Elbow flexion 4+/5 3+/5 5/5 5/5    ?Elbow extension 4+/5 3/5 5/5 5/5    ?Wrist flexion          ?Wrist extension          ?Wrist ulnar deviation          ?Wrist radial deviation          ?Wrist pronation          ?Wrist supination          ?Grip strength (lbs)          ?(Blank rows = not tested) ?  ?Cervical AROM limited 25% all planes  ?  ? ? ?PALPATION: ?11/14/2021: Exquisite TTP to Rt greater trochanter ? ?LE ROM: ? ?A/PROM Right ?11/14/2021 Left ?11/14/2021  ?Hip flexion 90/120 95/120  ?Hip abduction 50p! 55/60  ?Hip adduction 15/20 15/20  ?Hip internal rotation    ?Hip external rotation    ? (Blank rows = not tested) ? ?LE MMT: ? ?MMT Right ?11/14/2021 Left ?11/14/2021  ?Hip flexion 3/5 3/5  ?Hip extension 3-/5 3-/5  ?Hip abduction 3-/5 3-/5  ?Hip internal rotation 3/5 4/5  ?Hip external rotation 3/5 3/5  ? (Blank rows = not tested) ? ?LOWER EXTREMITY SPECIAL TESTS:  ?Ober's (11/14/2021): (+) on Rt ? ?FUNCTIONAL TESTS:  ?5xSTS (11/14/2021): 15 seconds ? ? ? ? ?SHOULDER SPECIAL TESTS: ?           Impingement tests: Bour/Kennedy impingement test: positive  ?Rotator cuff assessment: Drop arm test: negative, Empty can test: positive , Full can test: negative, and see diagnostic section ?  ?  ?PALPATION:  ?L supraspinatus groove can be easily palpated compared to contralateral side supine; scapulae equal ?            ?TODAY'S TREATMENT:  ? ?[facility] Adult PT Treatment:                                                DATE: 11/14/2021 ?Therapeutic Exercise: ?6 minutes Nustep warm-up while collecting subjective information ?Manual Therapy: ?N/A ?Neuromuscular re-ed: ?N/A ?Therapeutic Activity: ?Hip assessment with pt education on potential underlying GTPS ?Re-administration of FOTO with pt education ?Demonstration and  issuance of hip HEP ?Modalities: ?N/A ?Self Care: ?N/A ? ? ?[place] Adult PT Treatment:                                                DATE: 10/22/2021 ?Therapeutic Exercise: ?Seated low rows with 25# 2x10 ?Seated h

## 2021-11-14 ENCOUNTER — Ambulatory Visit: Payer: Medicare HMO | Attending: Orthopaedic Surgery

## 2021-11-14 DIAGNOSIS — R262 Difficulty in walking, not elsewhere classified: Secondary | ICD-10-CM | POA: Diagnosis present

## 2021-11-14 DIAGNOSIS — G8929 Other chronic pain: Secondary | ICD-10-CM | POA: Insufficient documentation

## 2021-11-14 DIAGNOSIS — M25512 Pain in left shoulder: Secondary | ICD-10-CM | POA: Diagnosis present

## 2021-11-14 DIAGNOSIS — M6281 Muscle weakness (generalized): Secondary | ICD-10-CM | POA: Diagnosis present

## 2021-11-14 DIAGNOSIS — M25551 Pain in right hip: Secondary | ICD-10-CM | POA: Insufficient documentation

## 2021-11-14 NOTE — Addendum Note (Signed)
Addended by: Vanessa [place] R on: 11/14/2021 11:29 AM ? ? Modules accepted: Orders ? ?

## 2021-11-21 ENCOUNTER — Ambulatory Visit: Payer: Medicare HMO

## 2021-11-21 DIAGNOSIS — M25512 Pain in left shoulder: Secondary | ICD-10-CM | POA: Diagnosis not present

## 2021-11-21 DIAGNOSIS — R262 Difficulty in walking, not elsewhere classified: Secondary | ICD-10-CM

## 2021-11-21 DIAGNOSIS — M6281 Muscle weakness (generalized): Secondary | ICD-10-CM

## 2021-11-21 DIAGNOSIS — G8929 Other chronic pain: Secondary | ICD-10-CM

## 2021-11-21 DIAGNOSIS — M25551 Pain in right hip: Secondary | ICD-10-CM

## 2021-11-21 NOTE — Therapy (Signed)
OUTPATIENT PHYSICAL THERAPY TREATMENT NOTE/ RE-EVALUATION  Progress Note Reporting Period 09/19/2021 to 11/21/2021   See note below for Objective Data and Assessment of Progress/Goals.      Patient Name: [name] MRN: [id number] DOB:1941-05-23, 81 y.o., female Today's Date: 11/21/2021  PCP: [name] REFERRING PROVIDER: [name]   PT End of Session - 11/21/21 1119     Visit Number 11    Number of Visits 18    Date for PT Re-Evaluation 01/16/22    Authorization Type Aetna MCR    Authorization Time Period FOTO V6, V10, kx mod v15    Progress Note Due on Visit 20    PT Start Time 1115    PT Stop Time 1155    PT Time Calculation (min) 40 min    Activity Tolerance Patient tolerated treatment well    Behavior During Therapy [facility] for tasks assessed/performed                      Past Medical History:  Diagnosis Date   Anemia    PMH;  Patient denies this dx as of 06/03/21   Anxiety    Asthma    Breast cancer ([place]) 1988   left breast cancer   Collagenous colitis    Compression fracture    lumbar 1   Dyspnea    with exertion   Hypertension    Hypothyroidism    Osteoarthritis    Osteopenia    Pneumonia 07/2020   PONV (postoperative nausea and vomiting)    Sleep apnea    wears CPAP nightly   Past Surgical History:  Procedure Laterality Date   BREAST REDUCTION SURGERY     Right   CATARACT EXTRACTION W/ INTRAOCULAR LENS  IMPLANT, BILATERAL     CESAREAN SECTION     COLONOSCOPY W/ BIOPSIES AND POLYPECTOMY     CYST EXCISION N/A 08/30/2018   Procedure: excision of posterior shoulder / back sebaceous cyst;  Surgeon: Wallace Going, DO;  Location: [place];  Service: Plastics;  Laterality: N/A;   KYPHOPLASTY N/A 02/18/2016   Procedure: LUMBAR 1 KYPHOPLASTY;  Surgeon: Phylliss Bob, MD;  Location: [place];  Service: Orthopedics;  Laterality: N/A;  LUMBAR 1 KYPHOPLASTY   KYPHOPLASTY N/A 06/04/2021   Procedure:  LUMBAR 2 KYPHOPLASTY;  Surgeon: Phylliss Bob, MD;  Location: [place];  Service: Orthopedics;  Laterality: N/A;   MODIFIED RADICAL MASTECTOMY W/ AXILLARY LYMPH NODE DISSECTION     Left   RECONSTRUCTION BREAST W/ LATISSIMUS DORSI FLAP     left   REDUCTION MAMMAPLASTY Right    right foot surgery     corrected hammer toe, straightened big toe   SEPTOPLASTY     TOE SURGERY     TONSILLECTOMY AND ADENOIDECTOMY     Patient Active Problem List   Diagnosis Date Noted   CAP (community acquired pneumonia) 09/30/2020   Chronic respiratory failure with hypoxia ([place]) 09/23/2020   Allergic rhinitis 08/31/2019   Pilar cyst 08/04/2018   History of breast cancer in female 06/09/2018   History of reconstruction of both breasts 06/09/2018   Severe obesity (BMI >= 40) ([place]) 09/16/2015   Asthma with acute exacerbation 09/15/2015   DOE (dyspnea on exertion) 01/11/2014   Insomnia 05/05/2012   Osteoarthritis    Osteopenia    Collagenous colitis    OSA (obstructive sleep apnea)    Hypothyroidism    Mild persistent asthma    Hypertension  Breast cancer, left breast ([facility])     REFERRING DIAG: L chronic RCT  THERAPY DIAG:  Chronic left shoulder pain  Muscle weakness (generalized)  Pain in right hip  Difficulty in walking, not elsewhere classified  PERTINENT HISTORY: About 6 years ago, pt was sitting on a footstool and went to pick up a toy that had fallen to the floor and grandson began to slide out of a chair and she threw her arm up to prevent him from falling.  She heard a pop. She had therapy before that assisted.In the past couple of months, pain began again insidiously. Pt has a history of hip pain of which she has a 37 month old prescription for as well; wants to concentrate on L shoulder first. She reports having a transport w/c to assist with cooking.   PRECAUTIONS: None  SUBJECTIVE: Pt reports that she was seen at [place] on Monday, where she received Lumbar X-ray. This  imaging shows L3 vertebral fx (likely compression fx). Pt reports a hx of two prior lumbar fxs, one at L1 and one at L2, for which she received kyphoplasty. She was instructed not to perform high-level loading or exercise, but that she may continue to perform low-level exercises. She is to be seen by Dr. Jacelyn Grip on 11/23/2021 to determine course of action from this point in regard to her new fx.  PAIN:  Are you having pain? No    OBJECTIVE:   *Unless otherwise noted, objective information collected previously*  DIAGNOSTIC FINDINGS:  MRI 09/06/21 IMPRESSION: 1. Moderate tendinosis of the supraspinatus tendon with a 10 mm full-thickness tear and 11 mm of retraction. 2. Moderate tendinosis of the infraspinatus tendon. 3. Moderate tendinosis of the subscapularis tendon. 4. Mild tendinosis of the intra-articular portion of the long head of the biceps tendon. 5. Severe osteoarthritis of the left glenohumeral joint.   PATIENT SURVEYS:  FOTO : 6, predicted 63 10/13/2021: 52% 11/14/2021: 66% (goal met)   COGNITION:           Overall cognitive status: Within functional limits for tasks assessed                                  SENSATION: WFL   POSTURE: Dowers hump, rounded shoulders bil, forward head.    UPPER EXTREMITY ROM:    Active ROM-measured sitting Right 09/19/2021 Left 09/19/2021 Left (supine) 10/13/2021  Shoulder flexion WFL 81 degrees 150 degrees with p!  Shoulder extension       Shoulder abduction WFL 40 deg with shoulder hike 160 degrees  Shoulder adduction       Shoulder internal rotation L4/5 L glute med  20 degrees  Shoulder external rotation T2 To L ear 60 degrees  Elbow flexion       Elbow extension       Wrist flexion       Wrist extension       Wrist ulnar deviation       Wrist radial deviation       Wrist pronation       Wrist supination       (Blank rows = not tested)   PROM: L shoulder supine: flexion to 120, abduction to 140, IR to approx 40, ER to approx  60   UPPER EXTREMITY MMT:   MMT Right 09/19/2021 Left 09/19/2021 Right 10/13/2021 Left 10/13/2021 Right 11/14/2021 Left 11/14/2021  Shoulder flexion 3+/5 2-/5 3+/5 3/5 4+/5  4+/5  Shoulder extension          Shoulder abduction 3-/5 2-/5 4+/5 4+/5 5/5 5/5  Shoulder adduction          Shoulder internal rotation 3+/5 2-/5 5/5 5/5 5/5 5/5  Shoulder external rotation 3+/5 2-/5 5/5 4+/5 5/5 5/5  Middle trapezius          Lower trapezius          Elbow flexion 4+/5 3+/5 5/5 5/5    Elbow extension 4+/5 3/5 5/5 5/5    Wrist flexion          Wrist extension          Wrist ulnar deviation          Wrist radial deviation          Wrist pronation          Wrist supination          Grip strength (lbs)          (Blank rows = not tested)   Cervical AROM limited 25% all planes      PALPATION: 11/14/2021: Exquisite TTP to Rt greater trochanter  LE ROM:  A/PROM Right 11/21/2021 Left 11/21/2021  Hip flexion 90/120 95/120  Hip abduction 50p! 55/60  Hip adduction 15/20 15/20  Hip internal rotation    Hip external rotation     (Blank rows = not tested)  LE MMT:  MMT Right 11/21/2021 Left 11/21/2021  Hip flexion 3/5 3/5  Hip extension 3-/5 3-/5  Hip abduction 3-/5 3-/5  Hip internal rotation 3/5 4/5  Hip external rotation 3/5 3/5   (Blank rows = not tested)  LOWER EXTREMITY SPECIAL TESTS:  Ober's (11/14/2021): (+) on Rt  FUNCTIONAL TESTS:  5xSTS (11/14/2021): 15 seconds     SHOULDER SPECIAL TESTS:            Impingement tests: Ludke/Kennedy impingement test: positive  Rotator cuff assessment: Drop arm test: negative, Empty can test: positive , Full can test: negative, and see diagnostic section     PALPATION:  L supraspinatus groove can be easily palpated compared to contralateral side supine; scapulae equal             TODAY'S TREATMENT:  [facility] Adult PT Treatment:                                                DATE: 11/21/2021 Therapeutic Exercise: 6 minutes Nustep warm-up  x5 minutes while collecting subjective information Seated march isometric with handhold resistance 2x10 with 5-sec hold BIL Seated BIL hip IR with RTB around ankles 2x15 Squat to table with alternating mini-squat side step down and back length of mat table x3 Seated LAQ 2x10 with 5-sec hold BIL Manual Therapy: N/A Neuromuscular re-ed: N/A Therapeutic Activity: N/A Modalities: N/A Self Care: N/A   [facility] Adult PT Treatment:                                                DATE: 11/14/2021 Therapeutic Exercise: 6 minutes Nustep warm-up while collecting subjective information Manual Therapy: N/A Neuromuscular re-ed: N/A Therapeutic Activity: Hip assessment with pt education on potential underlying GTPS Re-administration of FOTO with pt education Demonstration and issuance of hip  HEP Modalities: N/A Self Care: N/A   [facility] Adult PT Treatment:                                                DATE: 10/22/2021 Therapeutic Exercise: Seated low rows with 25# 2x10 Seated high rows with 25# 2x10 Seated lat pull-down with 10# cable 2x10 Seated shoulder rolls 2x10 forward and backward Standing shoulder scaption with 2# dumbbells 3x8 Standing BIL shoulder extension with 3# cables 3x10 Manual Therapy: N/A Neuromuscular re-ed: N/A Therapeutic Activity: N/A Modalities: N/A Self Care: N/A        PATIENT EDUCATION: Education details: reviewed PT POC, explained what tendonitis is Person educated: Patient Education method: Explanation Education comprehension: verbalized understanding     HOME EXERCISE PROGRAM:  Access Code: [email or web address] URL: [email or web address] Date: 09/24/2021 Prepared by: Edythe Lynn  Exercises - Supine Shoulder Flexion AAROM with Dowel  - 1 x daily - 7 x weekly - 2 sets - 10 reps - Supine Shoulder External Rotation in 45 Degrees Abduction AAROM with Dowel  - 1 x daily - 7 x weekly - 2 sets - 10 reps - Seated Scapular Retraction  - 1 x  daily - 7 x weekly - 2 sets - 10 reps - Circular Shoulder Pendulum with Table Support  - 1 x daily - 7 x weekly - 2 sets - 10 reps  Added 11/14/2021: - Squat with Chair Touch  - 1 x daily - 7 x weekly - 3 sets - 10 reps - Standing ITB Stretch  - 1 x daily - 7 x weekly - 2-min hold - Sidelying Hip Abduction  - 1 x daily - 7 x weekly - 3 sets - 10 reps     ASSESSMENT:   CLINICAL IMPRESSION: Due to pt report of new lumbar spine compression fx, exercises were regressed today until further instructions from her doctor after her appointment on Monday. She responded well to all interventions today with no increase in pain and good form with verbal cues. She will continue to benefit from skilled PT to address her primary impairments and return to her prior level of function with less limitation.     OBJECTIVE IMPAIRMENTS decreased activity tolerance, decreased mobility, decreased ROM, decreased strength, impaired UE functional use, postural dysfunction, and pain.    ACTIVITY LIMITATIONS cleaning, meal prep, shopping, and using L UE for fxnal activities .    PERSONAL FACTORS 3+ comorbidities: L breast cancer history, osteopenia, asthma, rotator cuff complex impairments  are also affecting patient's functional outcome.          GOALS: Goals reviewed with patient? Yes   SHORT TERM GOALS: Target date: 10/17/2021   Pt will be indep with initial HEP to assist with functional L UE strengthening Baseline: not initiated at eval due to time constraints Goal status: ACHIEVED 10/13/2021: Pt reports daily adherence to her HEP   2.  Pt will be able to hold the cell phone with L UE without resting it x 15 min Baseline: 10 min Goal status: ACHIEVED 10/13/2021: ACHIEVED           LONG TERM GOALS: Target date: 10/31/21   Pt will sleep in R sidelying at night >/=6 hours without waking up with L UE pain Baseline: 4-5 hours Goal status: ACHIEVED 10/13/2021: Pt reports no sleep disturbances due to pain  the past week  2.  Pt will put deodorant under L arm with </= 4/10 pain Baseline: 4/10-8/10 11/14/2021: Achieved Goal status: ACHIEVED    3.  Pt will be able to roll hair with 75% less difficulty using L UE Baseline: 85% difficulty 11/14/2021: 20% difficulty Goal status: ACHIEVED    4.  Pt will demonstrate improved L UE AROM flex/abdct/ER by 10 degrees to assist with functional mobility Baseline: flexion 81 deg, abdct 40 deg, ER to L ear measured seated Goal status: ACHIEVED 10/13/2021: See updated MMT chart   5. Pt will demo improved L UE strength to assist with functional mobility by >/= 1/2 MMT grade            Baseline:elbow flex/abdct 2-/5, elbow flex 3+/5            Goal status: ACHIEVED 10/13/2021: See updated MMT chart  6. *New 11/14/2021*   Pt will achieve BIL global hip strength of 4+/5 in order to progress her independent LE strengthening program with less limitation.  Baseline: See MMT chart  Goal Status: INITIAL  7. *New 11/14/2021*  Pt will achieve a 5xSTS of 12 seconds or less in order to promote safe transfers.  Baseline: 15 seconds:  Goal Status: INITIAL  8. *New 11/14/2021*  Pt will achieve full, pain-free Rt hip abduction AROM in order to get into her car with less limitation.  Baseline: 50d with 6/10 pain  Goal Status: INITIAL       PLAN: PT FREQUENCY: 1x/week   PT DURATION: 8 weeks   PLANNED INTERVENTIONS: Therapeutic exercises, Therapeutic activity, Patient/Family education, Joint mobilization, Cryotherapy, Moist heat, Taping, Aquatic Therapy, Dry Needling, Ionotophoresis 63m/ml Dexamethasone, and Manual therapy. Hold on IONTO until signed off by MD for approval due to breast CA history.    PLAN FOR NEXT SESSION: f/u on doctor's appointment for lumbar compression fx, progress low-level hip strengthening   YVanessa Winsted PT, DPT 11/21/21 11:56 AM

## 2021-11-25 ENCOUNTER — Ambulatory Visit: Payer: Medicare HMO

## 2021-11-25 DIAGNOSIS — M25512 Pain in left shoulder: Secondary | ICD-10-CM | POA: Diagnosis not present

## 2021-11-25 DIAGNOSIS — M25551 Pain in right hip: Secondary | ICD-10-CM

## 2021-11-25 DIAGNOSIS — M6281 Muscle weakness (generalized): Secondary | ICD-10-CM

## 2021-11-25 DIAGNOSIS — G8929 Other chronic pain: Secondary | ICD-10-CM

## 2021-11-25 DIAGNOSIS — R262 Difficulty in walking, not elsewhere classified: Secondary | ICD-10-CM

## 2021-11-25 NOTE — Therapy (Signed)
OUTPATIENT PHYSICAL THERAPY TREATMENT NOTE    Patient Name: [name] MRN: [id number] DOB:24-Oct-1940, 81 y.o., female Today's Date: 11/25/2021  PCP: [name] REFERRING PROVIDER: [name]   PT End of Session - 11/25/21 1613     Visit Number 12    Number of Visits 18    Date for PT Re-Evaluation 01/16/22    Authorization Type [employer] MCR    Authorization Time Period FOTO V6, V10, kx mod v15    Progress Note Due on Visit 20    PT Start Time 1615    PT Stop Time 1655    PT Time Calculation (min) 40 min    Activity Tolerance Patient tolerated treatment well    Behavior During Therapy [facility] for tasks assessed/performed                       Past Medical History:  Diagnosis Date   Anemia    PMH;  Patient denies this dx as of 06/03/21   Anxiety    Asthma    Breast cancer ([place]) 1988   left breast cancer   Collagenous colitis    Compression fracture    lumbar 1   Dyspnea    with exertion   Hypertension    Hypothyroidism    Osteoarthritis    Osteopenia    Pneumonia 07/2020   PONV (postoperative nausea and vomiting)    Sleep apnea    wears CPAP nightly   Past Surgical History:  Procedure Laterality Date   BREAST REDUCTION SURGERY     Right   CATARACT EXTRACTION W/ INTRAOCULAR LENS  IMPLANT, BILATERAL     CESAREAN SECTION     COLONOSCOPY W/ BIOPSIES AND POLYPECTOMY     CYST EXCISION N/A 08/30/2018   Procedure: excision of posterior shoulder / back sebaceous cyst;  Surgeon: Wallace Going, DO;  Location: [place];  Service: Plastics;  Laterality: N/A;   KYPHOPLASTY N/A 02/18/2016   Procedure: LUMBAR 1 KYPHOPLASTY;  Surgeon: Phylliss Bob, MD;  Location: [place];  Service: Orthopedics;  Laterality: N/A;  LUMBAR 1 KYPHOPLASTY   KYPHOPLASTY N/A 06/04/2021   Procedure: LUMBAR 2 KYPHOPLASTY;  Surgeon: Phylliss Bob, MD;  Location: [place];  Service: Orthopedics;  Laterality: N/A;   MODIFIED RADICAL MASTECTOMY W/  AXILLARY LYMPH NODE DISSECTION     Left   RECONSTRUCTION BREAST W/ LATISSIMUS DORSI FLAP     left   REDUCTION MAMMAPLASTY Right    right foot surgery     corrected hammer toe, straightened big toe   SEPTOPLASTY     TOE SURGERY     TONSILLECTOMY AND ADENOIDECTOMY     Patient Active Problem List   Diagnosis Date Noted   CAP (community acquired pneumonia) 09/30/2020   Chronic respiratory failure with hypoxia ([place]) 09/23/2020   Allergic rhinitis 08/31/2019   Pilar cyst 08/04/2018   History of breast cancer in female 06/09/2018   History of reconstruction of both breasts 06/09/2018   Severe obesity (BMI >= 40) ([place]) 09/16/2015   Asthma with acute exacerbation 09/15/2015   DOE (dyspnea on exertion) 01/11/2014   Insomnia 05/05/2012   Osteoarthritis    Osteopenia    Collagenous colitis    OSA (obstructive sleep apnea)    Hypothyroidism    Mild persistent asthma    Hypertension    Breast cancer, left breast (HCC)     REFERRING DIAG: L chronic RCT  THERAPY DIAG:  Chronic left  shoulder pain  Muscle weakness (generalized)  Pain in right hip  Difficulty in walking, not elsewhere classified  PERTINENT HISTORY: About 6 years ago, pt was sitting on a footstool and went to pick up a toy that had fallen to the floor and grandson began to slide out of a chair and she threw her arm up to prevent him from falling.  She heard a pop. She had therapy before that assisted.In the past couple of months, pain began again insidiously. Pt has a history of hip pain of which she has a 3 month old prescription for as well; wants to concentrate on L shoulder first. She reports having a transport w/c to assist with cooking.   PRECAUTIONS: None  SUBJECTIVE: Pt reports that her visit with Dr. Jacelyn Grip went well on Monday, adding that she is scheduled to have a lumbar corticosteroid injection next Thursday. She reports that after this, she may be following up to receive a third kyphoplasty if conservative  treatment fails.  PAIN:  Are you having pain? No    OBJECTIVE:   *Unless otherwise noted, objective information collected previously*  DIAGNOSTIC FINDINGS:  MRI 09/06/21 IMPRESSION: 1. Moderate tendinosis of the supraspinatus tendon with a 10 mm full-thickness tear and 11 mm of retraction. 2. Moderate tendinosis of the infraspinatus tendon. 3. Moderate tendinosis of the subscapularis tendon. 4. Mild tendinosis of the intra-articular portion of the long head of the biceps tendon. 5. Severe osteoarthritis of the left glenohumeral joint.   PATIENT SURVEYS:  FOTO : 64, predicted 63 10/13/2021: 52% 11/14/2021: 66% (goal met)   COGNITION:           Overall cognitive status: Within functional limits for tasks assessed                                  SENSATION: WFL   POSTURE: Dowers hump, rounded shoulders bil, forward head.    UPPER EXTREMITY ROM:    Active ROM-measured sitting Right 09/19/2021 Left 09/19/2021 Left (supine) 10/13/2021  Shoulder flexion WFL 81 degrees 150 degrees with p!  Shoulder extension       Shoulder abduction WFL 40 deg with shoulder hike 160 degrees  Shoulder adduction       Shoulder internal rotation L4/5 L glute med  20 degrees  Shoulder external rotation T2 To L ear 60 degrees  Elbow flexion       Elbow extension       Wrist flexion       Wrist extension       Wrist ulnar deviation       Wrist radial deviation       Wrist pronation       Wrist supination       (Blank rows = not tested)   PROM: L shoulder supine: flexion to 120, abduction to 140, IR to approx 40, ER to approx 60   UPPER EXTREMITY MMT:   MMT Right 09/19/2021 Left 09/19/2021 Right 10/13/2021 Left 10/13/2021 Right 11/14/2021 Left 11/14/2021  Shoulder flexion 3+/5 2-/5 3+/5 3/5 4+/5 4+/5  Shoulder extension          Shoulder abduction 3-/5 2-/5 4+/5 4+/5 5/5 5/5  Shoulder adduction          Shoulder internal rotation 3+/5 2-/5 5/5 5/5 5/5 5/5  Shoulder external rotation 3+/5  2-/5 5/5 4+/5 5/5 5/5  Middle trapezius          Lower  trapezius          Elbow flexion 4+/5 3+/5 5/5 5/5    Elbow extension 4+/5 3/5 5/5 5/5    Wrist flexion          Wrist extension          Wrist ulnar deviation          Wrist radial deviation          Wrist pronation          Wrist supination          Grip strength (lbs)          (Blank rows = not tested)   Cervical AROM limited 25% all planes      PALPATION: 11/14/2021: Exquisite TTP to Rt greater trochanter  LE ROM:  A/PROM Right 11/25/2021 Left 11/25/2021  Hip flexion 90/120 95/120  Hip abduction 50p! 55/60  Hip adduction 15/20 15/20  Hip internal rotation    Hip external rotation     (Blank rows = not tested)  LE MMT:  MMT Right 11/25/2021 Left 11/25/2021  Hip flexion 3/5 3/5  Hip extension 3-/5 3-/5  Hip abduction 3-/5 3-/5  Hip internal rotation 3/5 4/5  Hip external rotation 3/5 3/5   (Blank rows = not tested)  LOWER EXTREMITY SPECIAL TESTS:  Ober's (11/14/2021): (+) on Rt  FUNCTIONAL TESTS:  5xSTS (11/14/2021): 15 seconds     SHOULDER SPECIAL TESTS:            Impingement tests: Peterkin/Kennedy impingement test: positive  Rotator cuff assessment: Drop arm test: negative, Empty can test: positive , Full can test: negative, and see diagnostic section     PALPATION:  L supraspinatus groove can be easily palpated compared to contralateral side supine; scapulae equal             TODAY'S TREATMENT:   [facility] Adult PT Treatment:                                                DATE: 11/25/2021 Therapeutic Exercise: 6 minutes Nustep warm-up x5 minutes while collecting subjective information Hooklying march with sequential knee extension upon lowering 2x10 BIL Sidelying hip abduction 2x10 with 3-sec hold BIL PT-assisted Thomas stretch x12mn BIL Bridge 3x10 with 3-sec holds Seated low rows with 25# 2x10 Manual Therapy: N/A Neuromuscular re-ed: N/A Therapeutic Activity: N/A Modalities: N/A Self  Care: N/A   [facility] Adult PT Treatment:                                                DATE: 11/21/2021 Therapeutic Exercise: 6 minutes Nustep warm-up x5 minutes while collecting subjective information Seated march isometric with handhold resistance 2x10 with 5-sec hold BIL Seated BIL hip IR with RTB around ankles 2x15 Squat to table with alternating mini-squat side step down and back length of mat table x3 Seated LAQ 2x10 with 5-sec hold BIL Manual Therapy: N/A Neuromuscular re-ed: N/A Therapeutic Activity: N/A Modalities: N/A Self Care: N/A   [facility] Adult PT Treatment:  DATE: 11/14/2021 Therapeutic Exercise: 6 minutes Nustep warm-up while collecting subjective information Manual Therapy: N/A Neuromuscular re-ed: N/A Therapeutic Activity: Hip assessment with pt education on potential underlying GTPS Re-administration of FOTO with pt education Demonstration and issuance of hip HEP Modalities: N/A Self Care: N/A        PATIENT EDUCATION: Education details: reviewed PT POC, explained what tendonitis is Person educated: Patient Education method: Explanation Education comprehension: verbalized understanding     HOME EXERCISE PROGRAM:  Access Code: [email or web address] URL: [email or web address] Date: 09/24/2021 Prepared by: Edythe Lynn  Exercises - Supine Shoulder Flexion AAROM with Dowel  - 1 x daily - 7 x weekly - 2 sets - 10 reps - Supine Shoulder External Rotation in 45 Degrees Abduction AAROM with Dowel  - 1 x daily - 7 x weekly - 2 sets - 10 reps - Seated Scapular Retraction  - 1 x daily - 7 x weekly - 2 sets - 10 reps - Circular Shoulder Pendulum with Table Support  - 1 x daily - 7 x weekly - 2 sets - 10 reps  Added 11/14/2021: - Squat with Chair Touch  - 1 x daily - 7 x weekly - 3 sets - 10 reps - Standing ITB Stretch  - 1 x daily - 7 x weekly - 2-min hold - Sidelying Hip Abduction  - 1 x daily - 7 x  weekly - 3 sets - 10 reps     ASSESSMENT:   CLINICAL IMPRESSION: Pt responded well to all interventions today, demonstrating good form and no increase in pain with performed exercises. Will plan to continue low-level hip/ core strengthening to tolerance with decreased loading due to lumbar compression fx.  Pt will continue to benefit from skilled PT to address her primary impairments and return to her prior level of function with less limitation.      OBJECTIVE IMPAIRMENTS decreased activity tolerance, decreased mobility, decreased ROM, decreased strength, impaired UE functional use, postural dysfunction, and pain.    ACTIVITY LIMITATIONS cleaning, meal prep, shopping, and using L UE for fxnal activities .    PERSONAL FACTORS 3+ comorbidities: L breast cancer history, osteopenia, asthma, rotator cuff complex impairments  are also affecting patient's functional outcome.          GOALS: Goals reviewed with patient? Yes   SHORT TERM GOALS: Target date: 10/17/2021   Pt will be indep with initial HEP to assist with functional L UE strengthening Baseline: not initiated at eval due to time constraints Goal status: ACHIEVED 10/13/2021: Pt reports daily adherence to her HEP   2.  Pt will be able to hold the cell phone with L UE without resting it x 15 min Baseline: 10 min Goal status: ACHIEVED 10/13/2021: ACHIEVED           LONG TERM GOALS: Target date: 10/31/21   Pt will sleep in R sidelying at night >/=6 hours without waking up with L UE pain Baseline: 4-5 hours Goal status: ACHIEVED 10/13/2021: Pt reports no sleep disturbances due to pain the past week   2.  Pt will put deodorant under L arm with </= 4/10 pain Baseline: 4/10-8/10 11/14/2021: Achieved Goal status: ACHIEVED    3.  Pt will be able to roll hair with 75% less difficulty using L UE Baseline: 85% difficulty 11/14/2021: 20% difficulty Goal status: ACHIEVED    4.  Pt will demonstrate improved L UE AROM flex/abdct/ER  by 10 degrees to assist with functional mobility Baseline: flexion 81 deg, abdct 40 deg,  ER to L ear measured seated Goal status: ACHIEVED 10/13/2021: See updated MMT chart   5. Pt will demo improved L UE strength to assist with functional mobility by >/= 1/2 MMT grade            Baseline:elbow flex/abdct 2-/5, elbow flex 3+/5            Goal status: ACHIEVED 10/13/2021: See updated MMT chart  6. *New 11/14/2021*   Pt will achieve BIL global hip strength of 4+/5 in order to progress her independent LE strengthening program with less limitation.  Baseline: See MMT chart  Goal Status: INITIAL  7. *New 11/14/2021*  Pt will achieve a 5xSTS of 12 seconds or less in order to promote safe transfers.  Baseline: 15 seconds:  Goal Status: INITIAL  8. *New 11/14/2021*  Pt will achieve full, pain-free Rt hip abduction AROM in order to get into her car with less limitation.  Baseline: 50d with 6/10 pain  Goal Status: INITIAL       PLAN: PT FREQUENCY: 1x/week   PT DURATION: 8 weeks   PLANNED INTERVENTIONS: Therapeutic exercises, Therapeutic activity, Patient/Family education, Joint mobilization, Cryotherapy, Moist heat, Taping, Aquatic Therapy, Dry Needling, Ionotophoresis 39m/ml Dexamethasone, and Manual therapy. Hold on IONTO until signed off by MD for approval due to breast CA history.    PLAN FOR NEXT SESSION: f/u on doctor's appointment for lumbar compression fx, progress low-level hip strengthening   YVanessa Achille PT, DPT 11/25/21 4:57 PM

## 2021-11-27 ENCOUNTER — Other Ambulatory Visit: Payer: Self-pay | Admitting: Orthopedic Surgery

## 2021-11-27 ENCOUNTER — Other Ambulatory Visit: Payer: Self-pay | Admitting: Physician Assistant

## 2021-11-27 DIAGNOSIS — M546 Pain in thoracic spine: Secondary | ICD-10-CM

## 2021-11-27 DIAGNOSIS — M545 Low back pain, unspecified: Secondary | ICD-10-CM

## 2021-11-27 DIAGNOSIS — Z1231 Encounter for screening mammogram for malignant neoplasm of breast: Secondary | ICD-10-CM

## 2021-12-02 ENCOUNTER — Ambulatory Visit: Payer: Medicare HMO

## 2021-12-02 DIAGNOSIS — M25512 Pain in left shoulder: Secondary | ICD-10-CM | POA: Diagnosis not present

## 2021-12-02 DIAGNOSIS — M6281 Muscle weakness (generalized): Secondary | ICD-10-CM

## 2021-12-02 DIAGNOSIS — G8929 Other chronic pain: Secondary | ICD-10-CM

## 2021-12-02 DIAGNOSIS — M25551 Pain in right hip: Secondary | ICD-10-CM

## 2021-12-02 DIAGNOSIS — R262 Difficulty in walking, not elsewhere classified: Secondary | ICD-10-CM

## 2021-12-02 NOTE — Therapy (Signed)
OUTPATIENT PHYSICAL THERAPY TREATMENT NOTE    Patient Name: [name] MRN: [id number] DOB:[birth year], 81 y.o., female Today's Date: 12/02/2021  PCP: [name] REFERRING PROVIDER: [name]   PT End of Session - 12/02/21 1215     Visit Number 13    Number of Visits 18    Date for PT Re-Evaluation 01/16/22    Authorization Type [employer] MCR    Authorization Time Period FOTO V6, V10, kx mod v15    Progress Note Due on Visit 20    PT Start Time 1217    PT Stop Time 1257    PT Time Calculation (min) 40 min    Activity Tolerance Patient tolerated treatment well    Behavior During Therapy [facility] for tasks assessed/performed                        Past Medical History:  Diagnosis Date   Anemia    PMH;  Patient denies this dx as of 06/03/21   Anxiety    Asthma    Breast cancer ([place]) 1988   left breast cancer   Collagenous colitis    Compression fracture    lumbar 1   Dyspnea    with exertion   Hypertension    Hypothyroidism    Osteoarthritis    Osteopenia    Pneumonia 07/2020   PONV (postoperative nausea and vomiting)    Sleep apnea    wears CPAP nightly   Past Surgical History:  Procedure Laterality Date   BREAST REDUCTION SURGERY     Right   CATARACT EXTRACTION W/ INTRAOCULAR LENS  IMPLANT, BILATERAL     CESAREAN SECTION     COLONOSCOPY W/ BIOPSIES AND POLYPECTOMY     CYST EXCISION N/A 08/30/2018   Procedure: excision of posterior shoulder / back sebaceous cyst;  Surgeon: Wallace Going, DO;  Location: [place];  Service: Plastics;  Laterality: N/A;   KYPHOPLASTY N/A 02/18/2016   Procedure: LUMBAR 1 KYPHOPLASTY;  Surgeon: Phylliss Bob, MD;  Location: [place];  Service: Orthopedics;  Laterality: N/A;  LUMBAR 1 KYPHOPLASTY   KYPHOPLASTY N/A 06/04/2021   Procedure: LUMBAR 2 KYPHOPLASTY;  Surgeon: Phylliss Bob, MD;  Location: [place];  Service: Orthopedics;  Laterality: N/A;   MODIFIED RADICAL MASTECTOMY W/  AXILLARY LYMPH NODE DISSECTION     Left   RECONSTRUCTION BREAST W/ LATISSIMUS DORSI FLAP     left   REDUCTION MAMMAPLASTY Right    right foot surgery     corrected hammer toe, straightened big toe   SEPTOPLASTY     TOE SURGERY     TONSILLECTOMY AND ADENOIDECTOMY     Patient Active Problem List   Diagnosis Date Noted   CAP (community acquired pneumonia) 09/30/2020   Chronic respiratory failure with hypoxia ([place]) 09/23/2020   Allergic rhinitis 08/31/2019   Pilar cyst 08/04/2018   History of breast cancer in female 06/09/2018   History of reconstruction of both breasts 06/09/2018   Severe obesity (BMI >= 40) ([place]) 09/16/2015   Asthma with acute exacerbation 09/15/2015   DOE (dyspnea on exertion) 01/11/2014   Insomnia 05/05/2012   Osteoarthritis    Osteopenia    Collagenous colitis    OSA (obstructive sleep apnea)    Hypothyroidism    Mild persistent asthma    Hypertension    Breast cancer, left breast (HCC)     REFERRING DIAG: L chronic RCT  THERAPY DIAG:  Chronic  left shoulder pain  Muscle weakness (generalized)  Pain in right hip  Difficulty in walking, not elsewhere classified  PERTINENT HISTORY: About 6 years ago, pt was sitting on a footstool and went to pick up a toy that had fallen to the floor and grandson began to slide out of a chair and she threw her arm up to prevent him from falling.  She heard a pop. She had therapy before that assisted.In the past couple of months, pain began again insidiously. Pt has a history of hip pain of which she has a 63 month old prescription for as well; wants to concentrate on L shoulder first. She reports having a transport w/c to assist with cooking.   PRECAUTIONS: None  SUBJECTIVE: Pt reports that she has an appointment with Dr. Jacelyn Grip tomorrow for a nerve block from L3-L5. She reports her pain is 3/10 BIL hip and Lt knee pain today. She has been adherent to her HEP.   PAIN:  Are you having pain? Yes: NPRS scale: 3/10 Pain  location: BIL hips Pain description: [place]    OBJECTIVE:   *Unless otherwise noted, objective information collected previously*  DIAGNOSTIC FINDINGS:  MRI 09/06/21 IMPRESSION: 1. Moderate tendinosis of the supraspinatus tendon with a 10 mm full-thickness tear and 11 mm of retraction. 2. Moderate tendinosis of the infraspinatus tendon. 3. Moderate tendinosis of the subscapularis tendon. 4. Mild tendinosis of the intra-articular portion of the long head of the biceps tendon. 5. Severe osteoarthritis of the left glenohumeral joint.   PATIENT SURVEYS:  FOTO : 36, predicted 63 10/13/2021: 52% 11/14/2021: 66% (goal met)   COGNITION:           Overall cognitive status: Within functional limits for tasks assessed                                  SENSATION: WFL   POSTURE: Dowers hump, rounded shoulders bil, forward head.    UPPER EXTREMITY ROM:    Active ROM-measured sitting Right 09/19/2021 Left 09/19/2021 Left (supine) 10/13/2021  Shoulder flexion WFL 81 degrees 150 degrees with p!  Shoulder extension       Shoulder abduction WFL 40 deg with shoulder hike 160 degrees  Shoulder adduction       Shoulder internal rotation L4/5 L glute med  20 degrees  Shoulder external rotation T2 To L ear 60 degrees  Elbow flexion       Elbow extension       Wrist flexion       Wrist extension       Wrist ulnar deviation       Wrist radial deviation       Wrist pronation       Wrist supination       (Blank rows = not tested)   PROM: L shoulder supine: flexion to 120, abduction to 140, IR to approx 40, ER to approx 60   UPPER EXTREMITY MMT:   MMT Right 09/19/2021 Left 09/19/2021 Right 10/13/2021 Left 10/13/2021 Right 11/14/2021 Left 11/14/2021  Shoulder flexion 3+/5 2-/5 3+/5 3/5 4+/5 4+/5  Shoulder extension          Shoulder abduction 3-/5 2-/5 4+/5 4+/5 5/5 5/5  Shoulder adduction          Shoulder internal rotation 3+/5 2-/5 5/5 5/5 5/5 5/5  Shoulder external rotation 3+/5 2-/5 5/5  4+/5 5/5 5/5  Middle trapezius  Lower trapezius          Elbow flexion 4+/5 3+/5 5/5 5/5    Elbow extension 4+/5 3/5 5/5 5/5    Wrist flexion          Wrist extension          Wrist ulnar deviation          Wrist radial deviation          Wrist pronation          Wrist supination          Grip strength (lbs)          (Blank rows = not tested)   Cervical AROM limited 25% all planes      PALPATION: 11/14/2021: Exquisite TTP to Rt greater trochanter  LE ROM:  A/PROM Right 12/02/2021 Left 12/02/2021  Hip flexion 90/120 95/120  Hip abduction 50p! 55/60  Hip adduction 15/20 15/20  Hip internal rotation    Hip external rotation     (Blank rows = not tested)  LE MMT:  MMT Right 12/02/2021 Left 12/02/2021  Hip flexion 3/5 3/5  Hip extension 3-/5 3-/5  Hip abduction 3-/5 3-/5  Hip internal rotation 3/5 4/5  Hip external rotation 3/5 3/5   (Blank rows = not tested)  LOWER EXTREMITY SPECIAL TESTS:  Ober's (11/14/2021): (+) on Rt  FUNCTIONAL TESTS:  5xSTS (11/14/2021): 15 seconds     SHOULDER SPECIAL TESTS:            Impingement tests: Bordwell/Kennedy impingement test: positive  Rotator cuff assessment: Drop arm test: negative, Empty can test: positive , Full can test: negative, and see diagnostic section     PALPATION:  L supraspinatus groove can be easily palpated compared to contralateral side supine; scapulae equal             TODAY'S TREATMENT:   [facility] Adult PT Treatment:                                                DATE: 12/02/2021 Therapeutic Exercise: 6 minutes Nustep warm-up x5 minutes while collecting subjective information Standing hip extension with 7# cable to ankle attachment at Free Motion machine 2x10 BIL Standing hip abduction with 3# cable to ankle attachment at Free Motion machine 2x10 BIL Standing hip flexion with 3# cable to ankle attachment at Free Motion machine 2x10 BIL Standing hamstring curl with 7# cable to ankle attachment at Free  Motion machine 2x10 BIL Seated piriformis stretch x81mn BIL Manual Therapy: N/A Neuromuscular re-ed: N/A Therapeutic Activity: N/A Modalities: N/A Self Care: N/A   [facility]                                                DATE: 11/25/2021 Therapeutic Exercise: 6 minutes Nustep warm-up x5 minutes while collecting subjective information Hooklying march with sequential knee extension upon lowering 2x10 BIL Sidelying hip abduction 2x10 with 3-sec hold BIL PT-assisted Thomas stretch x152m BIL Bridge 3x10 with 3-sec holds Seated low rows with 25# 2x10 Manual Therapy: N/A Neuromuscular re-ed: N/A Therapeutic Activity: N/A Modalities: N/A Self Care: N/A   [facility] Adult PT Treatment:  DATE: 11/21/2021 Therapeutic Exercise: 6 minutes Nustep warm-up x5 minutes while collecting subjective information Seated march isometric with handhold resistance 2x10 with 5-sec hold BIL Seated BIL hip IR with RTB around ankles 2x15 Squat to table with alternating mini-squat side step down and back length of mat table x3 Seated LAQ 2x10 with 5-sec hold BIL Manual Therapy: N/A Neuromuscular re-ed: N/A Therapeutic Activity: N/A Modalities: N/A Self Care: N/A        PATIENT EDUCATION: Education details: reviewed PT POC, explained what tendonitis is Person educated: Patient Education method: Explanation Education comprehension: verbalized understanding     HOME EXERCISE PROGRAM:  Access Code: [email or web address] URL: [email or web address] Date: 09/24/2021 Prepared by: Edythe Lynn  Exercises - Supine Shoulder Flexion AAROM with Dowel  - 1 x daily - 7 x weekly - 2 sets - 10 reps - Supine Shoulder External Rotation in 45 Degrees Abduction AAROM with Dowel  - 1 x daily - 7 x weekly - 2 sets - 10 reps - Seated Scapular Retraction  - 1 x daily - 7 x weekly - 2 sets - 10 reps - Circular Shoulder Pendulum with Table  Support  - 1 x daily - 7 x weekly - 2 sets - 10 reps  Added 11/14/2021: - Squat with Chair Touch  - 1 x daily - 7 x weekly - 3 sets - 10 reps - Standing ITB Stretch  - 1 x daily - 7 x weekly - 2-min hold - Sidelying Hip Abduction  - 1 x daily - 7 x weekly - 3 sets - 10 reps     ASSESSMENT:   CLINICAL IMPRESSION: Pt responded well to all new interventions today, reporting mild fatigue and no increase in pain. She will continue to benefit from skilled PT to address her primary impairments and return to her prior level of function with less limitation.     OBJECTIVE IMPAIRMENTS decreased activity tolerance, decreased mobility, decreased ROM, decreased strength, impaired UE functional use, postural dysfunction, and pain.    ACTIVITY LIMITATIONS cleaning, meal prep, shopping, and using L UE for fxnal activities .    PERSONAL FACTORS 3+ comorbidities: L breast cancer history, osteopenia, asthma, rotator cuff complex impairments  are also affecting patient's functional outcome.          GOALS: Goals reviewed with patient? Yes   SHORT TERM GOALS: Target date: 10/17/2021   Pt will be indep with initial HEP to assist with functional L UE strengthening Baseline: not initiated at eval due to time constraints Goal status: ACHIEVED 10/13/2021: Pt reports daily adherence to her HEP   2.  Pt will be able to hold the cell phone with L UE without resting it x 15 min Baseline: 10 min Goal status: ACHIEVED 10/13/2021: ACHIEVED           LONG TERM GOALS: Target date: 10/31/21   Pt will sleep in R sidelying at night >/=6 hours without waking up with L UE pain Baseline: 4-5 hours Goal status: ACHIEVED 10/13/2021: Pt reports no sleep disturbances due to pain the past week   2.  Pt will put deodorant under L arm with </= 4/10 pain Baseline: 4/10-8/10 11/14/2021: Achieved Goal status: ACHIEVED    3.  Pt will be able to roll hair with 75% less difficulty using L UE Baseline: 85%  difficulty 11/14/2021: 20% difficulty Goal status: ACHIEVED    4.  Pt will demonstrate improved L UE AROM flex/abdct/ER by 10 degrees to assist with functional mobility Baseline: flexion 81 deg,  abdct 40 deg, ER to L ear measured seated Goal status: ACHIEVED 10/13/2021: See updated MMT chart   5. Pt will demo improved L UE strength to assist with functional mobility by >/= 1/2 MMT grade            Baseline:elbow flex/abdct 2-/5, elbow flex 3+/5            Goal status: ACHIEVED 10/13/2021: See updated MMT chart  6. *New 11/14/2021*   Pt will achieve BIL global hip strength of 4+/5 in order to progress her independent LE strengthening program with less limitation.  Baseline: See MMT chart  Goal Status: INITIAL  7. *New 11/14/2021*  Pt will achieve a 5xSTS of 12 seconds or less in order to promote safe transfers.  Baseline: 15 seconds:  Goal Status: INITIAL  8. *New 11/14/2021*  Pt will achieve full, pain-free Rt hip abduction AROM in order to get into her car with less limitation.  Baseline: 50d with 6/10 pain  Goal Status: INITIAL       PLAN: PT FREQUENCY: 1x/week   PT DURATION: 8 weeks   PLANNED INTERVENTIONS: Therapeutic exercises, Therapeutic activity, Patient/Family education, Joint mobilization, Cryotherapy, Moist heat, Taping, Aquatic Therapy, Dry Needling, Ionotophoresis 32m/ml Dexamethasone, and Manual therapy. Hold on IONTO until signed off by MD for approval due to breast CA history.    PLAN FOR NEXT SESSION: f/u on doctor's appointment for lumbar compression fx, progress low-level hip strengthening   YVanessa Turner PT, DPT 12/02/21 1:01 PM

## 2021-12-08 ENCOUNTER — Encounter (HOSPITAL_BASED_OUTPATIENT_CLINIC_OR_DEPARTMENT_OTHER): Payer: Self-pay

## 2021-12-08 ENCOUNTER — Observation Stay (HOSPITAL_BASED_OUTPATIENT_CLINIC_OR_DEPARTMENT_OTHER)
Admission: EM | Admit: 2021-12-08 | Discharge: 2021-12-10 | Disposition: A | Discharge status: Home / Self Care | Payer: Medicare HMO | Attending: Surgery | Admitting: Surgery

## 2021-12-08 ENCOUNTER — Other Ambulatory Visit: Payer: Self-pay

## 2021-12-08 DIAGNOSIS — I10 Essential (primary) hypertension: Secondary | ICD-10-CM | POA: Diagnosis not present

## 2021-12-08 DIAGNOSIS — M1990 Unspecified osteoarthritis, unspecified site: Secondary | ICD-10-CM | POA: Diagnosis not present

## 2021-12-08 DIAGNOSIS — E039 Hypothyroidism, unspecified: Secondary | ICD-10-CM | POA: Diagnosis not present

## 2021-12-08 DIAGNOSIS — I110 Hypertensive heart disease with heart failure: Secondary | ICD-10-CM | POA: Insufficient documentation

## 2021-12-08 DIAGNOSIS — I509 Heart failure, unspecified: Secondary | ICD-10-CM | POA: Insufficient documentation

## 2021-12-08 DIAGNOSIS — J45909 Unspecified asthma, uncomplicated: Secondary | ICD-10-CM | POA: Insufficient documentation

## 2021-12-08 DIAGNOSIS — R1031 Right lower quadrant pain: Secondary | ICD-10-CM | POA: Diagnosis present

## 2021-12-08 DIAGNOSIS — K353 Acute appendicitis with localized peritonitis, without perforation or gangrene: Secondary | ICD-10-CM | POA: Diagnosis not present

## 2021-12-08 DIAGNOSIS — Z79899 Other long term (current) drug therapy: Secondary | ICD-10-CM | POA: Insufficient documentation

## 2021-12-08 DIAGNOSIS — K3580 Unspecified acute appendicitis: Secondary | ICD-10-CM | POA: Diagnosis present

## 2021-12-08 DIAGNOSIS — Z853 Personal history of malignant neoplasm of breast: Secondary | ICD-10-CM | POA: Insufficient documentation

## 2021-12-08 DIAGNOSIS — Z9049 Acquired absence of other specified parts of digestive tract: Secondary | ICD-10-CM

## 2021-12-08 LAB — COMPREHENSIVE METABOLIC PANEL
ALT: 16 U/L (ref 0–44)
AST: 19 U/L (ref 15–41)
Albumin: 4.2 g/dL (ref 3.5–5.0)
Alkaline Phosphatase: 73 U/L (ref 38–126)
Anion gap: 12 (ref 5–15)
BUN: 15 mg/dL (ref 8–23)
CO2: 25 mmol/L (ref 22–32)
Calcium: 9.9 mg/dL (ref 8.9–10.3)
Chloride: 104 mmol/L (ref 98–111)
Creatinine, Ser: 0.88 mg/dL (ref 0.44–1.00)
GFR, Estimated: >60 mL/min (ref >60)
Glucose, Bld: 119 mg/dL — ABNORMAL HIGH (ref 70–99)
Potassium: 3.1 mmol/L — ABNORMAL LOW (ref 3.5–5.1)
Sodium: 141 mmol/L (ref 135–145)
Total Bilirubin: 0.5 mg/dL (ref 0.3–1.2)
Total Protein: 7.1 g/dL (ref 6.5–8.1)

## 2021-12-08 LAB — CBC
HCT: 40.1 % (ref 36.0–46.0)
Hemoglobin: 12.8 g/dL (ref 12.0–15.0)
MCH: 31.1 pg (ref 26.0–34.0)
MCHC: 31.9 g/dL (ref 30.0–36.0)
MCV: 97.6 fL (ref 80.0–100.0)
Platelets: 196 10*3/uL (ref 150–400)
RBC: 4.11 MIL/uL (ref 3.87–5.11)
RDW: 13.5 % (ref 11.5–15.5)
WBC: 9.9 10*3/uL (ref 4.0–10.5)
nRBC: 0 % (ref 0.0–0.2)

## 2021-12-08 LAB — LIPASE, BLOOD: Lipase: 24 U/L (ref 11–51)

## 2021-12-08 NOTE — ED Provider Notes (Signed)
[facility] EMERGENCY DEPT Provider Note   CSN: [id number] Arrival date & time: 12/08/21  1920     History {Add pertinent medical, surgical, social history, OB history to HPI:1} Chief Complaint  Patient presents with   Abdominal Pain    [name] is a 81 y.o. female.  Patient presents to the emergency department for evaluation of abdominal pain.  Patient reports that pain began around noon today.  Patient reports that pain worsens with movement.  It is predominantly on the right side.  She has had associated nausea but no vomiting.  No associated diarrhea.  She has not had a fever.      Home Medications Prior to Admission medications   Medication Sig Start Date End Date Taking? Authorizing Provider  ADVAIR [facility] 115-21 MCG/ACT inhaler USE 2 INHALATIONS TWICE A DAY 03/27/21   Chesley Mires, MD  albuterol (PROVENTIL) (2.5 MG/3ML) 0.083% nebulizer solution Take 3 mLs (2.5 mg dose) by nebulization every 6 (six) hours as needed for Wheezing. 10/09/19   Chesley Mires, MD  albuterol (VENTOLIN HFA) 108 (90 Base) MCG/ACT inhaler USE 2 INHALATIONS EVERY 6 HOURS AS NEEDED FOR WHEEZING OR SHORTNESS OF BREATH 05/12/21   Chesley Mires, MD  B Complex-C (B-COMPLEX WITH VITAMIN C) tablet Take 2 tablets by mouth in the morning.    [provider]  Calcium Carb-Cholecalciferol (CALTRATE 600+D3 PO) Take 1 tablet by mouth in the morning and at bedtime.    [provider]  clobetasol ointment (TEMOVATE) 4.16 % Apply 1 application topically 2 (two) times daily as needed (irritation).    [provider]  denosumab (PROLIA) 60 MG/ML SOSY injection 60 mg every 6 (six) months.    [provider]  diclofenac sodium (VOLTAREN) 1 % GEL 1 application 4 (four) times daily as needed (pain.). 04/10/14   [provider]  diltiazem (CARDIZEM CD) 240 MG 24 hr capsule Take 240 mg by mouth in the morning.    [provider]  diphenhydrAMINE (BENADRYL) 50 MG  capsule Take 100 mg by mouth See admin instructions. Take 2 capsules (50 mg) by mouth up to 3 times daily    [provider]  furosemide (LASIX) 80 MG tablet Take 1 tablet (80 mg total) by mouth daily. Can take additional dose if weigh increase >2 lbs in 2 days Patient taking differently: Take 40 mg by mouth daily. 12/23/20   Cantwell, Celeste C, PA-C  glucosamine-chondroitin 500-400 MG tablet Take 1 tablet by mouth 3 (three) times daily.    [provider]  hydrALAZINE (APRESOLINE) 50 MG tablet TAKE 1 TABLET THREE TIMES A DAY 03/10/21   Cantwell, Celeste C, PA-C  KRILL OIL PO Take 1 capsule by mouth daily.    [provider]  levothyroxine (SYNTHROID, LEVOTHROID) 88 MCG tablet Take 88 mcg by mouth daily.    [provider]  losartan (COZAAR) 100 MG tablet Take 100 mg by mouth in the morning.    [provider]  magnesium oxide (MAG-OX) 400 (240 Mg) MG tablet Take 400 mg by mouth daily as needed (regularity (constipation)).    [provider]  MELATONIN PO Take 2 Doses by mouth at bedtime. ZzzQuil Pure Zzzs Melatonin Sleep Aid Gummies    [provider]  methocarbamol (ROBAXIN) 500 MG tablet Take 500 mg by mouth in the morning, at noon, and at bedtime.    [provider]  Multiple Vitamins-Minerals (MULTIVITAMIN WITH MINERALS) tablet Take 1 tablet by mouth daily at 12  noon. [facility]    [provider]  ORTHOVISC 30 MG/2ML SOSY Inject 6 mLs into the skin every 6 (six) months. 11/10/20   [provider]  Polyethyl Glycol-Propyl Glycol (SYSTANE) 0.4-0.3 % SOLN Place 1-2 drops into both eyes 3 (three) times daily as needed (dry/irritated eyes.).    [provider]  Polyethyl Glycol-Propyl Glycol 0.4-0.3 % SOLN 1 drop as needed.    [provider]  Polyethylene Glycol 400 (BLINK TEARS) 0.25 % GEL Place 1 drop into both eyes at bedtime.    [provider]  potassium chloride SA  (KLOR-CON M20) 20 MEQ tablet Take 3 tablets (60 mEq total) by mouth daily. Patient taking differently: Take 20 mEq by mouth 3 (three) times daily. 12/23/20 06/03/24  Cantwell, Celeste C, PA-C  sertraline (ZOLOFT) 100 MG tablet Take 100 mg by mouth 2 (two) times daily.     [provider]  traMADol (ULTRAM) 50 MG tablet Take 50 mg by mouth 3 (three) times daily as needed. 09/17/21   [provider]      Allergies    Nebivolol hcl, Bactrim [sulfamethoxazole-trimethoprim], Dairy aid [tilactase], Lactose intolerance (gi), Microplegia msa-msg [cardioplegia del nido formula], Adhesive [tape], Meloxicam, Other, and Sulfa antibiotics    Review of Systems   Review of Systems  Physical Exam Updated Vital Signs BP (!) 148/53 (BP Location: Right Arm)   Pulse 84   Temp 98.8 F (37.1 C) (Oral)   Resp 18   Ht 5' 1.5" (1.562 m)   Wt 105.7 kg   SpO2 95%   BMI 43.31 kg/m  Physical Exam Vitals and nursing note reviewed.  Constitutional:      General: She is not in acute distress.    Appearance: She is well-developed.  HENT:     Head: Normocephalic and atraumatic.     Mouth/Throat:     Mouth: Mucous membranes are moist.  Eyes:     General: Vision grossly intact. Gaze aligned appropriately.     Extraocular Movements: Extraocular movements intact.     Conjunctiva/sclera: Conjunctivae normal.  Cardiovascular:     Rate and Rhythm: Normal rate and regular rhythm.     Pulses: Normal pulses.     Heart sounds: Normal heart sounds, S1 normal and S2 normal. No murmur heard.   No friction rub. No gallop.  Pulmonary:     Effort: Pulmonary effort is normal. No respiratory distress.     Breath sounds: Normal breath sounds.  Abdominal:     General: Bowel sounds are normal.     Palpations: Abdomen is soft.     Tenderness: There is abdominal tenderness in the right upper quadrant. There is no guarding or rebound.     Hernia: No hernia is present.  Musculoskeletal:        General: No  swelling.     Cervical back: Full passive range of motion without pain, normal range of motion and neck supple. No spinous process tenderness or muscular tenderness. Normal range of motion.     Right lower leg: No edema.     Left lower leg: No edema.  Skin:    General: Skin is warm and dry.     Capillary Refill: Capillary refill takes less than 2 seconds.     Findings: No ecchymosis, erythema, rash or wound.  Neurological:     General: No focal deficit present.     Mental Status: She is alert and oriented to person, place, and time.  GCS: GCS eye subscore is 4. GCS verbal subscore is 5. GCS motor subscore is 6.     Cranial Nerves: Cranial nerves 2-12 are intact.     Sensory: Sensation is intact.     Motor: Motor function is intact.     Coordination: Coordination is intact.  Psychiatric:        Attention and Perception: Attention normal.        Mood and Affect: Mood normal.        Speech: Speech normal.        Behavior: Behavior normal.    ED Results / Procedures / Treatments   Labs (all labs ordered are listed, but only abnormal results are displayed) Labs Reviewed  COMPREHENSIVE METABOLIC PANEL - Abnormal; Notable for the following components:      Result Value   Potassium 3.1 (*)    Glucose, Bld 119 (*)    All other components within normal limits  LIPASE, BLOOD  CBC  URINALYSIS, ROUTINE W REFLEX MICROSCOPIC    EKG None  Radiology No results found.  Procedures Procedures  {Document cardiac monitor, telemetry assessment procedure when appropriate:1}  Medications Ordered in ED Medications - No data to display  ED Course/ Medical Decision Making/ A&P                           Medical Decision Making Amount and/or Complexity of Data Reviewed Labs: ordered. Radiology: ordered.   ***  {Document critical care time when appropriate:1} {Document review of labs and clinical decision tools ie heart score, Chads2Vasc2 etc:1}  {Document your independent review  of radiology images, and any outside records:1} {Document your discussion with family members, caretakers, and with consultants:1} {Document social determinants of health affecting pt's care:1} {Document your decision making why or why not admission, treatments were needed:1} Final Clinical Impression(s) / ED Diagnoses Final diagnoses:  None    Rx / DC Orders ED Discharge Orders     None

## 2021-12-08 NOTE — ED Triage Notes (Signed)
RLQ pain since noon. No vomiting or diarrhea but pt endorses nausea.

## 2021-12-09 ENCOUNTER — Emergency Department (HOSPITAL_BASED_OUTPATIENT_CLINIC_OR_DEPARTMENT_OTHER): Payer: Medicare HMO

## 2021-12-09 ENCOUNTER — Ambulatory Visit: Payer: Medicare HMO

## 2021-12-09 ENCOUNTER — Other Ambulatory Visit: Payer: Self-pay

## 2021-12-09 ENCOUNTER — Emergency Department (HOSPITAL_COMMUNITY): Payer: Medicare HMO | Admitting: Certified Registered Nurse Anesthetist

## 2021-12-09 ENCOUNTER — Encounter (HOSPITAL_COMMUNITY)
Admission: EM | Disposition: A | Discharge status: Home / Self Care | Payer: Self-pay | Source: Home / Self Care | Attending: Emergency Medicine

## 2021-12-09 ENCOUNTER — Encounter (HOSPITAL_BASED_OUTPATIENT_CLINIC_OR_DEPARTMENT_OTHER): Payer: Self-pay | Admitting: Radiology

## 2021-12-09 ENCOUNTER — Emergency Department (EMERGENCY_DEPARTMENT_HOSPITAL): Payer: Medicare HMO | Admitting: Certified Registered Nurse Anesthetist

## 2021-12-09 DIAGNOSIS — I110 Hypertensive heart disease with heart failure: Secondary | ICD-10-CM | POA: Diagnosis not present

## 2021-12-09 DIAGNOSIS — I10 Essential (primary) hypertension: Secondary | ICD-10-CM

## 2021-12-09 DIAGNOSIS — R1031 Right lower quadrant pain: Secondary | ICD-10-CM | POA: Diagnosis present

## 2021-12-09 DIAGNOSIS — M1990 Unspecified osteoarthritis, unspecified site: Secondary | ICD-10-CM

## 2021-12-09 DIAGNOSIS — Z79899 Other long term (current) drug therapy: Secondary | ICD-10-CM | POA: Diagnosis not present

## 2021-12-09 DIAGNOSIS — J45909 Unspecified asthma, uncomplicated: Secondary | ICD-10-CM | POA: Diagnosis not present

## 2021-12-09 DIAGNOSIS — K3580 Unspecified acute appendicitis: Secondary | ICD-10-CM | POA: Diagnosis present

## 2021-12-09 DIAGNOSIS — Z9049 Acquired absence of other specified parts of digestive tract: Secondary | ICD-10-CM

## 2021-12-09 DIAGNOSIS — I509 Heart failure, unspecified: Secondary | ICD-10-CM | POA: Diagnosis not present

## 2021-12-09 DIAGNOSIS — K353 Acute appendicitis with localized peritonitis, without perforation or gangrene: Secondary | ICD-10-CM | POA: Diagnosis not present

## 2021-12-09 DIAGNOSIS — E039 Hypothyroidism, unspecified: Secondary | ICD-10-CM | POA: Diagnosis not present

## 2021-12-09 DIAGNOSIS — Z853 Personal history of malignant neoplasm of breast: Secondary | ICD-10-CM | POA: Diagnosis not present

## 2021-12-09 HISTORY — PX: LAPAROSCOPIC APPENDECTOMY: SHX408

## 2021-12-09 SURGERY — APPENDECTOMY, LAPAROSCOPIC
Anesthesia: General | Site: Abdomen

## 2021-12-09 MED ORDER — DIPHENHYDRAMINE HCL 50 MG/ML IJ SOLN: 12.5000 mg | Freq: Four times a day (QID) | INTRAMUSCULAR | Status: DC | PRN

## 2021-12-09 MED ORDER — ONDANSETRON HCL 4 MG/2ML IJ SOLN
4.0000 mg | Freq: Once | INTRAMUSCULAR | Status: AC
Start: 2021-12-09 — End: 2021-12-09

## 2021-12-09 MED ORDER — DEXAMETHASONE SODIUM PHOSPHATE 10 MG/ML IJ SOLN
INTRAMUSCULAR | Status: DC | PRN
  Administered 2021-12-09: 10 mg via INTRAVENOUS

## 2021-12-09 MED ORDER — ROCURONIUM BROMIDE 10 MG/ML (PF) SYRINGE
PREFILLED_SYRINGE | INTRAVENOUS | Status: DC | PRN
  Administered 2021-12-09: 50 mg via INTRAVENOUS

## 2021-12-09 MED ORDER — AMISULPRIDE (ANTIEMETIC) 5 MG/2ML IV SOLN
10.0000 mg | Freq: Once | INTRAVENOUS | Status: AC | PRN
  Administered 2021-12-09: 10 mg via INTRAVENOUS

## 2021-12-09 MED ORDER — BUPIVACAINE-EPINEPHRINE 0.25% -1:200000 IJ SOLN
INTRAMUSCULAR | Status: DC | PRN
  Administered 2021-12-09: 18 mL

## 2021-12-09 MED ORDER — SODIUM CHLORIDE 0.9 % IV SOLN: INTRAVENOUS | Status: DC

## 2021-12-09 MED ORDER — SUCCINYLCHOLINE CHLORIDE 200 MG/10ML IV SOSY
PREFILLED_SYRINGE | INTRAVENOUS | Status: AC
  Filled 2021-12-09: qty 10

## 2021-12-09 MED ORDER — ONDANSETRON HCL 4 MG/2ML IJ SOLN
INTRAMUSCULAR | Status: AC
  Administered 2021-12-09: 4 mg via INTRAVENOUS
  Filled 2021-12-09: qty 2

## 2021-12-09 MED ORDER — DIPHENHYDRAMINE HCL 12.5 MG/5ML PO ELIX: 12.5000 mg | ORAL_SOLUTION | Freq: Four times a day (QID) | ORAL | Status: DC | PRN

## 2021-12-09 MED ORDER — IOHEXOL 300 MG/ML  SOLN
100.0000 mL | Freq: Once | INTRAMUSCULAR | Status: AC | PRN
  Administered 2021-12-09: 100 mL via INTRAVENOUS

## 2021-12-09 MED ORDER — DILTIAZEM HCL ER COATED BEADS 240 MG PO CP24
240.0000 mg | ORAL_CAPSULE | Freq: Every morning | ORAL | Status: DC
  Administered 2021-12-10: 240 mg via ORAL
  Filled 2021-12-09: qty 1

## 2021-12-09 MED ORDER — AMISULPRIDE (ANTIEMETIC) 5 MG/2ML IV SOLN
INTRAVENOUS | Status: AC
  Filled 2021-12-09: qty 4

## 2021-12-09 MED ORDER — HYDRALAZINE HCL 50 MG PO TABS
50.0000 mg | ORAL_TABLET | Freq: Three times a day (TID) | ORAL | Status: DC
  Administered 2021-12-09 – 2021-12-10 (×3): 50 mg via ORAL
  Filled 2021-12-09 (×3): qty 1

## 2021-12-09 MED ORDER — EPHEDRINE SULFATE-NACL 50-0.9 MG/10ML-% IV SOSY
PREFILLED_SYRINGE | INTRAVENOUS | Status: DC | PRN
  Administered 2021-12-09: 5 mg via INTRAVENOUS

## 2021-12-09 MED ORDER — OYSTER SHELL CALCIUM/D3 500-5 MG-MCG PO TABS
1.0000 | ORAL_TABLET | Freq: Every day | ORAL | Status: DC
  Administered 2021-12-09: 1 via ORAL
  Filled 2021-12-09: qty 1

## 2021-12-09 MED ORDER — ONDANSETRON HCL 4 MG/2ML IJ SOLN
4.0000 mg | Freq: Four times a day (QID) | INTRAMUSCULAR | Status: DC | PRN
  Administered 2021-12-09: 4 mg via INTRAVENOUS
  Filled 2021-12-09: qty 2

## 2021-12-09 MED ORDER — LOSARTAN POTASSIUM 50 MG PO TABS
100.0000 mg | ORAL_TABLET | Freq: Every morning | ORAL | Status: DC
  Administered 2021-12-10: 100 mg via ORAL
  Filled 2021-12-09: qty 2

## 2021-12-09 MED ORDER — ONDANSETRON HCL 4 MG/2ML IJ SOLN
INTRAMUSCULAR | Status: AC
  Filled 2021-12-09: qty 4

## 2021-12-09 MED ORDER — HYDROMORPHONE HCL 1 MG/ML IJ SOLN
0.5000 mg | Freq: Once | INTRAMUSCULAR | Status: AC
  Administered 2021-12-09: 0.5 mg via INTRAVENOUS
  Filled 2021-12-09: qty 1

## 2021-12-09 MED ORDER — LIDOCAINE 2% (20 MG/ML) 5 ML SYRINGE
INTRAMUSCULAR | Status: AC
Start: 2021-12-09 — End: ?
  Filled 2021-12-09: qty 5

## 2021-12-09 MED ORDER — SIMETHICONE 80 MG PO CHEW: 40.0000 mg | CHEWABLE_TABLET | Freq: Four times a day (QID) | ORAL | Status: DC | PRN

## 2021-12-09 MED ORDER — ROCURONIUM BROMIDE 10 MG/ML (PF) SYRINGE
PREFILLED_SYRINGE | INTRAVENOUS | Status: AC
  Filled 2021-12-09: qty 10

## 2021-12-09 MED ORDER — PHENYLEPHRINE 80 MCG/ML (10ML) SYRINGE FOR IV PUSH (FOR BLOOD PRESSURE SUPPORT)
PREFILLED_SYRINGE | INTRAVENOUS | Status: AC
  Filled 2021-12-09: qty 10

## 2021-12-09 MED ORDER — METHOCARBAMOL 500 MG PO TABS
500.0000 mg | ORAL_TABLET | Freq: Three times a day (TID) | ORAL | Status: DC
  Administered 2021-12-09 – 2021-12-10 (×3): 500 mg via ORAL
  Filled 2021-12-09 (×3): qty 1

## 2021-12-09 MED ORDER — POTASSIUM CHLORIDE 10 MEQ/100ML IV SOLN
10.0000 meq | INTRAVENOUS | Status: AC
  Administered 2021-12-09 (×2): 10 meq via INTRAVENOUS
  Filled 2021-12-09 (×2): qty 100

## 2021-12-09 MED ORDER — POLYVINYL ALCOHOL 1.4 % OP SOLN: 1.0000 [drp] | Freq: Three times a day (TID) | OPHTHALMIC | Status: DC | PRN

## 2021-12-09 MED ORDER — ORAL CARE MOUTH RINSE: 15.0000 mL | Freq: Once | OROMUCOSAL | Status: AC

## 2021-12-09 MED ORDER — MELATONIN 5 MG PO TABS: 5.0000 mg | ORAL_TABLET | Freq: Every evening | ORAL | Status: DC | PRN

## 2021-12-09 MED ORDER — FENTANYL CITRATE (PF) 100 MCG/2ML IJ SOLN: 25.0000 ug | INTRAMUSCULAR | Status: DC | PRN

## 2021-12-09 MED ORDER — ROCURONIUM BROMIDE 10 MG/ML (PF) SYRINGE
PREFILLED_SYRINGE | INTRAVENOUS | Status: AC
Start: 2021-12-09 — End: ?
  Filled 2021-12-09: qty 10

## 2021-12-09 MED ORDER — SODIUM CHLORIDE 0.9 % IR SOLN
Status: DC | PRN
  Administered 2021-12-09: 1000 mL

## 2021-12-09 MED ORDER — PROPOFOL 10 MG/ML IV BOLUS
INTRAVENOUS | Status: DC | PRN
  Administered 2021-12-09: 150 mg via INTRAVENOUS

## 2021-12-09 MED ORDER — POTASSIUM CHLORIDE CRYS ER 20 MEQ PO TBCR
20.0000 meq | EXTENDED_RELEASE_TABLET | Freq: Three times a day (TID) | ORAL | Status: DC
  Administered 2021-12-09 – 2021-12-10 (×3): 20 meq via ORAL
  Filled 2021-12-09 (×3): qty 1

## 2021-12-09 MED ORDER — ONDANSETRON HCL 4 MG/2ML IJ SOLN
INTRAMUSCULAR | Status: AC
  Filled 2021-12-09: qty 2

## 2021-12-09 MED ORDER — AMISULPRIDE (ANTIEMETIC) 5 MG/2ML IV SOLN
INTRAVENOUS | Status: AC
  Filled 2021-12-09: qty 2

## 2021-12-09 MED ORDER — CHLORHEXIDINE GLUCONATE 0.12 % MT SOLN
15.0000 mL | Freq: Once | OROMUCOSAL | Status: AC
  Administered 2021-12-09: 15 mL via OROMUCOSAL

## 2021-12-09 MED ORDER — ONDANSETRON HCL 4 MG/2ML IJ SOLN
INTRAMUSCULAR | Status: DC | PRN
  Administered 2021-12-09: 4 mg via INTRAVENOUS

## 2021-12-09 MED ORDER — DEXAMETHASONE SODIUM PHOSPHATE 10 MG/ML IJ SOLN
INTRAMUSCULAR | Status: AC
Start: 2021-12-09 — End: ?
  Filled 2021-12-09: qty 1

## 2021-12-09 MED ORDER — ACETAMINOPHEN 500 MG PO TABS
1000.0000 mg | ORAL_TABLET | Freq: Four times a day (QID) | ORAL | Status: DC
  Administered 2021-12-09 – 2021-12-10 (×4): 1000 mg via ORAL
  Filled 2021-12-09 (×4): qty 2

## 2021-12-09 MED ORDER — SUCCINYLCHOLINE CHLORIDE 200 MG/10ML IV SOSY
PREFILLED_SYRINGE | INTRAVENOUS | Status: AC
Start: 2021-12-09 — End: ?
  Filled 2021-12-09: qty 10

## 2021-12-09 MED ORDER — FENTANYL CITRATE (PF) 250 MCG/5ML IJ SOLN
INTRAMUSCULAR | Status: AC
  Filled 2021-12-09: qty 5

## 2021-12-09 MED ORDER — SERTRALINE HCL 100 MG PO TABS
100.0000 mg | ORAL_TABLET | Freq: Two times a day (BID) | ORAL | Status: DC
  Administered 2021-12-09 – 2021-12-10 (×2): 100 mg via ORAL
  Filled 2021-12-09 (×2): qty 1

## 2021-12-09 MED ORDER — LEVOTHYROXINE SODIUM 88 MCG PO TABS
88.0000 ug | ORAL_TABLET | Freq: Every day | ORAL | Status: DC
  Administered 2021-12-10: 88 ug via ORAL
  Filled 2021-12-09: qty 1

## 2021-12-09 MED ORDER — ENOXAPARIN SODIUM 40 MG/0.4ML IJ SOSY
40.0000 mg | PREFILLED_SYRINGE | INTRAMUSCULAR | Status: DC
  Administered 2021-12-09: 40 mg via SUBCUTANEOUS
  Filled 2021-12-09: qty 0.4

## 2021-12-09 MED ORDER — B COMPLEX-C PO TABS
1.0000 | ORAL_TABLET | Freq: Every morning | ORAL | Status: DC
  Administered 2021-12-10: 1 via ORAL
  Filled 2021-12-09: qty 1

## 2021-12-09 MED ORDER — 0.9 % SODIUM CHLORIDE (POUR BTL) OPTIME
TOPICAL | Status: DC | PRN
  Administered 2021-12-09: 1000 mL

## 2021-12-09 MED ORDER — EPHEDRINE 5 MG/ML INJ
INTRAVENOUS | Status: AC
  Filled 2021-12-09: qty 10

## 2021-12-09 MED ORDER — LACTATED RINGERS IV SOLN: INTRAVENOUS | Status: AC

## 2021-12-09 MED ORDER — LIDOCAINE 2% (20 MG/ML) 5 ML SYRINGE
INTRAMUSCULAR | Status: AC
  Filled 2021-12-09: qty 5

## 2021-12-09 MED ORDER — SUGAMMADEX SODIUM 200 MG/2ML IV SOLN
INTRAVENOUS | Status: DC | PRN
  Administered 2021-12-09: 200 mg via INTRAVENOUS

## 2021-12-09 MED ORDER — DEXAMETHASONE SODIUM PHOSPHATE 10 MG/ML IJ SOLN
INTRAMUSCULAR | Status: AC
  Filled 2021-12-09: qty 2

## 2021-12-09 MED ORDER — PROPOFOL 10 MG/ML IV BOLUS
INTRAVENOUS | Status: AC
Start: 2021-12-09 — End: ?
  Filled 2021-12-09: qty 20

## 2021-12-09 MED ORDER — LACTATED RINGERS IV SOLN: INTRAVENOUS | Status: DC

## 2021-12-09 MED ORDER — HYDROMORPHONE HCL 1 MG/ML IJ SOLN: 0.5000 mg | INTRAMUSCULAR | Status: DC | PRN

## 2021-12-09 MED ORDER — PIPERACILLIN-TAZOBACTAM 3.375 G IVPB 30 MIN
3.3750 g | Freq: Once | INTRAVENOUS | Status: AC
Start: 2021-12-09 — End: 2021-12-09
  Administered 2021-12-09: 3.375 g via INTRAVENOUS
  Filled 2021-12-09: qty 50

## 2021-12-09 MED ORDER — SUCCINYLCHOLINE CHLORIDE 200 MG/10ML IV SOSY
PREFILLED_SYRINGE | INTRAVENOUS | Status: DC | PRN
  Administered 2021-12-09: 120 mg via INTRAVENOUS

## 2021-12-09 MED ORDER — PIPERACILLIN-TAZOBACTAM 3.375 G IVPB
3.3750 g | Freq: Three times a day (TID) | INTRAVENOUS | Status: DC
  Administered 2021-12-09 – 2021-12-10 (×3): 3.375 g via INTRAVENOUS
  Filled 2021-12-09 (×3): qty 50

## 2021-12-09 MED ORDER — FENTANYL CITRATE PF 50 MCG/ML IJ SOSY
50.0000 ug | PREFILLED_SYRINGE | Freq: Once | INTRAMUSCULAR | Status: AC
  Administered 2021-12-09: 50 ug via INTRAVENOUS
  Filled 2021-12-09: qty 1

## 2021-12-09 MED ORDER — ONDANSETRON HCL 4 MG/2ML IJ SOLN
4.0000 mg | Freq: Once | INTRAMUSCULAR | Status: AC
  Administered 2021-12-09: 4 mg via INTRAVENOUS
  Filled 2021-12-09: qty 2

## 2021-12-09 MED ORDER — TRAMADOL HCL 50 MG PO TABS
50.0000 mg | ORAL_TABLET | Freq: Three times a day (TID) | ORAL | Status: DC | PRN
  Administered 2021-12-09: 50 mg via ORAL
  Filled 2021-12-09: qty 1

## 2021-12-09 MED ORDER — ONDANSETRON 4 MG PO TBDP: 4.0000 mg | ORAL_TABLET | Freq: Four times a day (QID) | ORAL | Status: DC | PRN

## 2021-12-09 MED ORDER — LIDOCAINE 2% (20 MG/ML) 5 ML SYRINGE
INTRAMUSCULAR | Status: DC | PRN
  Administered 2021-12-09: 80 mg via INTRAVENOUS

## 2021-12-09 MED ORDER — MOMETASONE FURO-FORMOTEROL FUM 200-5 MCG/ACT IN AERO
2.0000 | INHALATION_SPRAY | Freq: Two times a day (BID) | RESPIRATORY_TRACT | Status: DC
  Administered 2021-12-09 – 2021-12-10 (×2): 2 via RESPIRATORY_TRACT
  Filled 2021-12-09: qty 8.8

## 2021-12-09 MED ORDER — BUPIVACAINE-EPINEPHRINE (PF) 0.25% -1:200000 IJ SOLN
INTRAMUSCULAR | Status: AC
  Filled 2021-12-09: qty 30

## 2021-12-09 MED ORDER — FUROSEMIDE 40 MG PO TABS
40.0000 mg | ORAL_TABLET | Freq: Every day | ORAL | Status: DC
  Administered 2021-12-10: 40 mg via ORAL
  Filled 2021-12-09: qty 1

## 2021-12-09 MED ORDER — ALBUTEROL SULFATE (2.5 MG/3ML) 0.083% IN NEBU: 2.5000 mg | INHALATION_SOLUTION | Freq: Four times a day (QID) | RESPIRATORY_TRACT | Status: DC | PRN

## 2021-12-09 MED ORDER — FENTANYL CITRATE (PF) 250 MCG/5ML IJ SOLN
INTRAMUSCULAR | Status: DC | PRN
Start: 2021-12-09 — End: 2021-12-09
  Administered 2021-12-09 (×2): 50 ug via INTRAVENOUS
  Administered 2021-12-09: 100 ug via INTRAVENOUS

## 2021-12-09 SURGICAL SUPPLY — 48 items
ADH SKN CLS APL DERMABOND .7 (GAUZE/BANDAGES/DRESSINGS) ×1
APL PRP STRL LF DISP 70% ISPRP (MISCELLANEOUS) ×1
APPLIER CLIP 5 13 M/L LIGAMAX5 (MISCELLANEOUS)
APR CLP MED LRG 5 ANG JAW (MISCELLANEOUS)
BAG COUNTER SPONGE SURGICOUNT (BAG) ×2 IMPLANT
BAG RETRIEVAL 10 (BASKET) ×1
BAG SPNG CNTER NS LX DISP (BAG) ×1
BLADE CLIPPER SURG (BLADE) IMPLANT
CANISTER SUCT 3000ML PPV (MISCELLANEOUS) ×2 IMPLANT
CHLORAPREP W/TINT 26 (MISCELLANEOUS) ×2 IMPLANT
CLIP APPLIE 5 13 M/L LIGAMAX5 (MISCELLANEOUS) IMPLANT
COVER SURGICAL LIGHT HANDLE (MISCELLANEOUS) ×2 IMPLANT
CUTTER FLEX LINEAR 45M (STAPLE) ×2 IMPLANT
DERMABOND ADVANCED (GAUZE/BANDAGES/DRESSINGS) ×1
DERMABOND ADVANCED .7 DNX12 (GAUZE/BANDAGES/DRESSINGS) ×1 IMPLANT
ELECT REM PT RETURN 9FT ADLT (ELECTROSURGICAL) ×2
ELECTRODE REM PT RTRN 9FT ADLT (ELECTROSURGICAL) ×1 IMPLANT
GLOVE BIO SURGEON STRL SZ7.5 (GLOVE) ×2 IMPLANT
GLOVE INDICATOR 8.0 STRL GRN (GLOVE) ×2 IMPLANT
GOWN STRL REUS W/ TWL LRG LVL3 (GOWN DISPOSABLE) ×2 IMPLANT
GOWN STRL REUS W/ TWL XL LVL3 (GOWN DISPOSABLE) ×1 IMPLANT
GOWN STRL REUS W/TWL LRG LVL3 (GOWN DISPOSABLE) ×4
GOWN STRL REUS W/TWL XL LVL3 (GOWN DISPOSABLE) ×2
KIT BASIN OR (CUSTOM PROCEDURE TRAY) ×2 IMPLANT
KIT TURNOVER KIT B (KITS) ×2 IMPLANT
NS IRRIG 1000ML POUR BTL (IV SOLUTION) ×2 IMPLANT
PAD ARMBOARD 7.5X6 YLW CONV (MISCELLANEOUS) ×4 IMPLANT
PENCIL SMOKE EVACUATOR (MISCELLANEOUS) ×2 IMPLANT
RELOAD 45 VASCULAR/THIN (ENDOMECHANICALS) ×2 IMPLANT
RELOAD STAPLE 45 2.5 WHT GRN (ENDOMECHANICALS) IMPLANT
RELOAD STAPLE 45 3.5 BLU ETS (ENDOMECHANICALS) IMPLANT
RELOAD STAPLE TA45 3.5 REG BLU (ENDOMECHANICALS) IMPLANT
SCISSORS LAP 5X35 DISP (ENDOMECHANICALS) IMPLANT
SET IRRIG TUBING LAPAROSCOPIC (IRRIGATION / IRRIGATOR) ×2 IMPLANT
SET TUBE SMOKE EVAC HIGH FLOW (TUBING) ×2 IMPLANT
SHEARS HARMONIC ACE PLUS 36CM (ENDOMECHANICALS) ×2 IMPLANT
SPECIMEN JAR SMALL (MISCELLANEOUS) ×2 IMPLANT
SUT MNCRL AB 4-0 PS2 18 (SUTURE) ×2 IMPLANT
SYS BAG RETRIEVAL 10MM (BASKET) ×1
SYSTEM BAG RETRIEVAL 10MM (BASKET) IMPLANT
TOWEL GREEN STERILE (TOWEL DISPOSABLE) ×2 IMPLANT
TOWEL GREEN STERILE FF (TOWEL DISPOSABLE) ×2 IMPLANT
TRAY FOLEY W/BAG SLVR 16FR (SET/KITS/TRAYS/PACK) ×2
TRAY FOLEY W/BAG SLVR 16FR ST (SET/KITS/TRAYS/PACK) ×1 IMPLANT
TRAY LAPAROSCOPIC MC (CUSTOM PROCEDURE TRAY) ×2 IMPLANT
TROCAR ADV FIXATION 5X100MM (TROCAR) ×4 IMPLANT
TROCAR XCEL BLUNT TIP 100MML (ENDOMECHANICALS) ×2 IMPLANT
WATER STERILE IRR 1000ML POUR (IV SOLUTION) ×2 IMPLANT

## 2021-12-09 NOTE — ED Notes (Signed)
Put on 2L was at 88%

## 2021-12-09 NOTE — Transfer of Care (Signed)
Immediate Anesthesia Transfer of Care Note  Patient: [name]  Procedure(s) Performed: APPENDECTOMY LAPAROSCOPIC (Abdomen)  Patient Location: PACU  Anesthesia Type:General  Level of Consciousness: patient cooperative and responds to stimulation  Airway & Oxygen Therapy: Patient Spontanous Breathing and Patient connected to nasal cannula oxygen  Post-op Assessment: Report given to RN and Post -op Vital signs reviewed and stable  Post vital signs: Reviewed and stable  Last Vitals:  Vitals Value Taken Time  BP 161/62 12/09/21 1315  Temp    Pulse 71 12/09/21 1318  Resp 16 12/09/21 1318  SpO2 94 % 12/09/21 1318  Vitals shown include unvalidated device data.  Last Pain:  Vitals:   12/09/21 1100  TempSrc: Oral  PainSc: 2          Complications: No notable events documented.

## 2021-12-09 NOTE — H&P (Signed)
CC: RLQ pain  HPI: [name] is an 81 y.o. female with hx of HTN, hypothyroidism, CHF (now well compensated, presumed 2/2 HTN), OSA (uses CPAP at night) - presented to [facility] overnight with acute onset RLQ pain, began around noon 6/6. Never had this before. Pain is sharp, persistent. Some nausea, no emesis. No radiation. No aggravating/alleviating factors.   Last colonoscopy ~2012, due for repeat now (17yr  PSH: C-sx x1  Past Medical History:  Diagnosis Date   Anemia    PMH;  Patient denies this dx as of 06/03/21   Anxiety    Asthma    Breast cancer ([place] 1988   left breast cancer   Collagenous colitis    Compression fracture    lumbar 1   Dyspnea    with exertion   Hypertension    Hypothyroidism    Osteoarthritis    Osteopenia    Pneumonia 07/2020   PONV (postoperative nausea and vomiting)    Sleep apnea    wears CPAP nightly    Past Surgical History:  Procedure Laterality Date   BREAST REDUCTION SURGERY     Right   CATARACT EXTRACTION W/ INTRAOCULAR LENS  IMPLANT, BILATERAL     CESAREAN SECTION     COLONOSCOPY W/ BIOPSIES AND POLYPECTOMY     CYST EXCISION N/A 08/30/2018   Procedure: excision of posterior shoulder / back sebaceous cyst;  Surgeon: DWallace Going DO;  [place] [facility]  Service: Plastics;  Laterality: N/A;   KYPHOPLASTY N/A 02/18/2016   Procedure: LUMBAR 1 KYPHOPLASTY;  Surgeon: MPhylliss Bob MD;  Location[place] [facility]  Service: Orthopedics;  Laterality: N/A;  LUMBAR 1 KYPHOPLASTY   KYPHOPLASTY N/A 06/04/2021   Procedure: LUMBAR 2 KYPHOPLASTY;  Surgeon: DPhylliss Bob MD;  [place] [facility]  Service: Orthopedics;  Laterality: N/A;   MODIFIED RADICAL MASTECTOMY W/ AXILLARY LYMPH NODE DISSECTION     Left   RECONSTRUCTION BREAST W/ LATISSIMUS DORSI FLAP     left   REDUCTION MAMMAPLASTY Right    right foot surgery     corrected hammer toe, straightened big toe   SEPTOPLASTY     TOE SURGERY     TONSILLECTOMY AND  ADENOIDECTOMY      Family History  Problem Relation Age of Onset   Lung cancer Mother    Hypertension Mother    Hypertension Father    Ovarian cancer Sister    Hypertension Brother    Angina Brother 768  Breast cancer Maternal Aunt    Brain cancer Maternal Aunt    Kidney cancer Paternal Uncle    Prostate cancer Paternal Uncle    Breast cancer Other        cousin   Ovarian cancer Other        cousin   Kidney cancer Other        cousin    Social:  reports that she has never smoked. She has never used smokeless tobacco. She reports current alcohol use of about 1.0 standard drink per week. She reports that she does not use drugs.  Allergies:  Allergies  Allergen Reactions   Nebivolol Hcl Other (See Comments)    Hypotensive episode   Bactrim [Sulfamethoxazole-Trimethoprim] Other (See Comments)    UNSPECIFIED    Dairy Aid [Tilactase] Diarrhea and Other (See Comments)    Ice cream, sour cream   Lactose Intolerance (Gi) Diarrhea   Microplegia Msa-Msg [Cardioplegia [place] Nido Formula] Other (See Comments)    Unknown  reaction    Adhesive [Tape] Rash   Meloxicam Nausea Only   Other Nausea And Vomiting    general anesthesia   Sulfa Antibiotics Rash         Medications: I have reviewed the patient's current medications.  Results for orders placed or performed during the hospital encounter of 12/08/21 (from the past 48 hour(s))  Lipase, blood     Status: None   Collection Time: 12/08/21  8:08 PM  Result Value Ref Range   Lipase 24 11 - 51 U/L    Comment: Performed at [facility], [address], [place], [place] [address]  Comprehensive metabolic panel     Status: Abnormal   Collection Time: 12/08/21  8:08 PM  Result Value Ref Range   Sodium 141 135 - 145 mmol/L   Potassium 3.1 (L) 3.5 - 5.1 mmol/L   Chloride 104 98 - 111 mmol/L   CO2 25 22 - 32 mmol/L   Glucose, Bld 119 (H) 70 - 99 mg/dL    Comment: Glucose reference range applies only to samples  taken after fasting for at least 8 hours.   BUN 15 8 - 23 mg/dL   Creatinine, Ser 0.88 0.44 - 1.00 mg/dL   Calcium 9.9 8.9 - 10.3 mg/dL   Total Protein 7.1 6.5 - 8.1 g/dL   Albumin 4.2 3.5 - 5.0 g/dL   AST 19 15 - 41 U/L   ALT 16 0 - 44 U/L   Alkaline Phosphatase 73 38 - 126 U/L   Total Bilirubin 0.5 0.3 - 1.2 mg/dL   GFR, Estimated >60 >60 mL/min    Comment: (NOTE) Calculated using the CKD-EPI Creatinine Equation (2021)    Anion gap 12 5 - 15    Comment: Performed at [facility], [address] [place], [place] [address]  CBC     Status: None   Collection Time: 12/08/21  8:08 PM  Result Value Ref Range   WBC 9.9 4.0 - 10.5 K/uL   RBC 4.11 3.87 - 5.11 MIL/uL   Hemoglobin 12.8 12.0 - 15.0 g/dL   HCT 40.1 36.0 - 46.0 %   MCV 97.6 80.0 - 100.0 fL   MCH 31.1 26.0 - 34.0 pg   MCHC 31.9 30.0 - 36.0 g/dL   RDW 13.5 11.5 - 15.5 %   Platelets 196 150 - 400 K/uL   nRBC 0.0 0.0 - 0.2 %    Comment: Performed at [facility], [address] [place], [place] [address]    CT ABDOMEN PELVIS W CONTRAST  Result Date: 12/09/2021 CLINICAL DATA:  Right lower quadrant pain. EXAM: CT ABDOMEN AND PELVIS WITH CONTRAST TECHNIQUE: Multidetector CT imaging of the abdomen and pelvis was performed using the standard protocol following bolus administration of intravenous contrast. RADIATION DOSE REDUCTION: This exam was performed according to the departmental dose-optimization program which includes automated exposure control, adjustment of the mA and/or kV according to patient size and/or use of iterative reconstruction technique. CONTRAST:  135m OMNIPAQUE IOHEXOL 300 MG/ML  SOLN COMPARISON:  None Available. FINDINGS: Lower chest: No acute abnormality. Hepatobiliary: 1.0 cm and 1.1 cm foci of low attenuation are seen within the right and left lobes of the liver. No gallstones, gallbladder wall thickening, or biliary dilatation. Pancreas: Unremarkable. No pancreatic  ductal dilatation or surrounding inflammatory changes. Spleen: Normal in size without focal abnormality. Adrenals/Urinary Tract: Adrenal glands are unremarkable. Kidneys are normal, without renal calculi, focal lesion, or hydronephrosis. Bladder is unremarkable. Stomach/Bowel: Stomach is  within normal limits. The appendix is moderately thickened and inflamed. There is no evidence of associated perforation or abscess. No evidence of bowel dilatation. Noninflamed diverticula are seen throughout the sigmoid colon. Vascular/Lymphatic: Aortic atherosclerosis. No enlarged abdominal or pelvic lymph nodes. Reproductive: The uterus is poorly visualized and may be atrophic. The bilateral adnexa are unremarkable. Other: No abdominal wall hernia or abnormality. No abdominopelvic ascites. Musculoskeletal: Prior vertebroplasty is seen at the levels of L1 and L2 with multilevel degenerative changes noted throughout the remainder of the lumbar spine. IMPRESSION: 1. Acute appendicitis without evidence of associated perforation or abscess. 2. Sigmoid diverticulosis. 3. Small hepatic cysts versus hemangiomas. 4. Prior vertebroplasty at the levels of L1 and L2 with multilevel degenerative changes throughout the remainder of the lumbar spine. 5. Aortic atherosclerosis. Aortic Atherosclerosis (ICD10-I70.0). Electronically Signed   By: Virgina Norfolk M.D.   On: 12/09/2021 02:26   US ABDOMEN LIMITED RUQ (LIVER/GB)  Result Date: 12/09/2021 CLINICAL DATA:  Right upper quadrant pain. EXAM: ULTRASOUND ABDOMEN LIMITED RIGHT UPPER QUADRANT COMPARISON:  None Available. FINDINGS: Gallbladder: No gallstones or wall thickening visualized (3.4 mm). No sonographic Murphy sign noted by sonographer. Common bile duct: Diameter: 5.5 mm Liver: No focal lesion identified. Within normal limits in parenchymal echogenicity. Portal vein is patent on color Doppler imaging with normal direction of blood flow towards the liver. Other: The study is limited  secondary to the patient's body habitus and overlying bowel gas. IMPRESSION: Normal right upper quadrant ultrasound. Electronically Signed   By: Virgina Norfolk M.D.   On: 12/09/2021 01:07    ROS - all of the below systems have been reviewed with the patient and positives are indicated with bold text General: chills, fever or night sweats Eyes: blurry vision or double vision ENT: epistaxis or sore throat Allergy/Immunology: itchy/watery eyes or nasal congestion Hematologic/Lymphatic: bleeding problems, blood clots or swollen lymph nodes Endocrine: temperature intolerance or unexpected weight changes Breast: new or changing breast lumps or nipple discharge Resp: cough, shortness of breath, or wheezing CV: chest pain or dyspnea on exertion GI: as per HPI GU: dysuria, trouble voiding, or hematuria MSK: joint pain or joint stiffness Neuro: TIA or stroke symptoms Derm: pruritus and skin lesion changes Psych: anxiety and depression  PE Blood pressure (!) 134/47, pulse 64, temperature 98.5 F (36.9 C), temperature source Oral, resp. rate 20, height 5' 1.5" (1.562 m), weight 105.7 kg, SpO2 97 %. Constitutional: NAD; conversant Eyes: Moist conjunctiva; no lid lag; anicteric; PERRL Lungs: Normal respiratory effort CV: RRR; no palpable thrills; no pitting edema GI: Abd soft, ttp in RLQ; no tenderness elsewhere; nondistended; no palpable hepatosplenomegaly MSK: Normal range of motion of extremities Psychiatric: Appropriate affect; alert and oriented x3  Results for orders placed or performed during the hospital encounter of 12/08/21 (from the past 48 hour(s))  Lipase, blood     Status: None   Collection Time: 12/08/21  8:08 PM  Result Value Ref Range   Lipase 24 11 - 51 U/L    Comment: Performed at [facility], [place], [place], [place] [address]  Comprehensive metabolic panel     Status: Abnormal   Collection Time: 12/08/21  8:08 PM  Result Value Ref Range    Sodium 141 135 - 145 mmol/L   Potassium 3.1 (L) 3.5 - 5.1 mmol/L   Chloride 104 98 - 111 mmol/L   CO2 25 22 - 32 mmol/L   Glucose, Bld 119 (H) 70 - 99 mg/dL    Comment: Glucose  reference range applies only to samples taken after fasting for at least 8 hours.   BUN 15 8 - 23 mg/dL   Creatinine, Ser 0.88 0.44 - 1.00 mg/dL   Calcium 9.9 8.9 - 10.3 mg/dL   Total Protein 7.1 6.5 - 8.1 g/dL   Albumin 4.2 3.5 - 5.0 g/dL   AST 19 15 - 41 U/L   ALT 16 0 - 44 U/L   Alkaline Phosphatase 73 38 - 126 U/L   Total Bilirubin 0.5 0.3 - 1.2 mg/dL   GFR, Estimated >60 >60 mL/min    Comment: (NOTE) Calculated using the CKD-EPI Creatinine Equation (2021)    Anion gap 12 5 - 15    Comment: Performed at [facility], [address] [place], [place] [address]  CBC     Status: None   Collection Time: 12/08/21  8:08 PM  Result Value Ref Range   WBC 9.9 4.0 - 10.5 K/uL   RBC 4.11 3.87 - 5.11 MIL/uL   Hemoglobin 12.8 12.0 - 15.0 g/dL   HCT 40.1 36.0 - 46.0 %   MCV 97.6 80.0 - 100.0 fL   MCH 31.1 26.0 - 34.0 pg   MCHC 31.9 30.0 - 36.0 g/dL   RDW 13.5 11.5 - 15.5 %   Platelets 196 150 - 400 K/uL   nRBC 0.0 0.0 - 0.2 %    Comment: Performed at [facility], [address] [place], [place] [address]    CT ABDOMEN PELVIS W CONTRAST  Result Date: 12/09/2021 CLINICAL DATA:  Right lower quadrant pain. EXAM: CT ABDOMEN AND PELVIS WITH CONTRAST TECHNIQUE: Multidetector CT imaging of the abdomen and pelvis was performed using the standard protocol following bolus administration of intravenous contrast. RADIATION DOSE REDUCTION: This exam was performed according to the departmental dose-optimization program which includes automated exposure control, adjustment of the mA and/or kV according to patient size and/or use of iterative reconstruction technique. CONTRAST:  141m OMNIPAQUE IOHEXOL 300 MG/ML  SOLN COMPARISON:  None Available. FINDINGS: Lower chest: No acute abnormality.  Hepatobiliary: 1.0 cm and 1.1 cm foci of low attenuation are seen within the right and left lobes of the liver. No gallstones, gallbladder wall thickening, or biliary dilatation. Pancreas: Unremarkable. No pancreatic ductal dilatation or surrounding inflammatory changes. Spleen: Normal in size without focal abnormality. Adrenals/Urinary Tract: Adrenal glands are unremarkable. Kidneys are normal, without renal calculi, focal lesion, or hydronephrosis. Bladder is unremarkable. Stomach/Bowel: Stomach is within normal limits. The appendix is moderately thickened and inflamed. There is no evidence of associated perforation or abscess. No evidence of bowel dilatation. Noninflamed diverticula are seen throughout the sigmoid colon. Vascular/Lymphatic: Aortic atherosclerosis. No enlarged abdominal or pelvic lymph nodes. Reproductive: The uterus is poorly visualized and may be atrophic. The bilateral adnexa are unremarkable. Other: No abdominal wall hernia or abnormality. No abdominopelvic ascites. Musculoskeletal: Prior vertebroplasty is seen at the levels of L1 and L2 with multilevel degenerative changes noted throughout the remainder of the lumbar spine. IMPRESSION: 1. Acute appendicitis without evidence of associated perforation or abscess. 2. Sigmoid diverticulosis. 3. Small hepatic cysts versus hemangiomas. 4. Prior vertebroplasty at the levels of L1 and L2 with multilevel degenerative changes throughout the remainder of the lumbar spine. 5. Aortic atherosclerosis. Aortic Atherosclerosis (ICD10-I70.0). Electronically Signed   By: TVirgina NorfolkM.D.   On: 12/09/2021 02:26   [place]DOM[place] LIMITED RUQ (LIVER/GB)  Result Date: 12/09/2021 CLINICAL DATA:  Right upper quadrant pain. EXAM: ULTRASOUND ABDOMEN LIMITED RIGHT UPPER QUADRANT COMPARISON:  None  Available. FINDINGS: Gallbladder: No gallstones or wall thickening visualized (3.4 mm). No sonographic Murphy sign noted by sonographer. Common bile duct: Diameter: 5.5 mm  Liver: No focal lesion identified. Within normal limits in parenchymal echogenicity. Portal vein is patent on color Doppler imaging with normal direction of blood flow towards the liver. Other: The study is limited secondary to the patient's body habitus and overlying bowel gas. IMPRESSION: Normal right upper quadrant ultrasound. Electronically Signed   By: Virgina Norfolk M.D.   On: 12/09/2021 01:07    I have personally reviewed the relevant CT A/P 12/09/21, CBC, CMP  A/P: [name] is an 81 y.o. female with HTN, hypothyroidism, CHF (now well compensated, presumed 2/2 HTN), OSA - here with acute appendicitis, no evidence of perforation  -The anatomy and physiology of the GI tract was discussed at length with her this morning. The pathophysiology of appendicitis was discussed as well. -We reviewed options moving forward for treatment, covering IV abx vs surgery. We discussed that with antibiotics alone, there is reasonable success in managing appendicitis, however, risks of recurrence at 85yr being as high as 40% in some studies. We discussed appendectomy - laparoscopic and potential open techniques as well as scenarios where an ileocecectomy could be necessary. We discussed the material risks (including, but not limited to, pain, bleeding, infection, scarring, need for blood transfusion, damage to surrounding structures- blood vessels/nerves/viscus/organs, damage to ureter/bladder, urine leak, leak from staple line, need for additional procedures, hernia, recurrence although quite low, pneumonia, heart attack, stroke, death) benefits and alternatives to surgery were discussed. The patient's questions were answered to her satisfaction, she voiced understanding and elected to proceed with surgery. Additionally, we discussed typical postoperative expectations and the recovery process.  I spent a total of 75 minutes in both face-to-face and non-face-to-face activities, excluding procedures performed,  for this visit on the date of this encounter.  CNadeen Landau [facility], [place]

## 2021-12-09 NOTE — ED Notes (Signed)
Assisted pt to the restroom 

## 2021-12-09 NOTE — Anesthesia Preprocedure Evaluation (Addendum)
Anesthesia Evaluation  Patient identified by MRN, date of birth, ID band Patient awake    History of Anesthesia Complications (+) PONV and history of anesthetic complications  Airway Mallampati: II  TM Distance: >3 FB     Dental   Pulmonary pneumonia,    breath sounds clear to auscultation       Cardiovascular hypertension, + DOE   Rhythm:Regular Rate:Normal     Neuro/Psych    GI/Hepatic Neg liver ROS, History noted Dr. Nyoka Cowden   Endo/Other  Hypothyroidism   Renal/GU negative Renal ROS     Musculoskeletal  (+) Arthritis ,   Abdominal   Peds  Hematology   Anesthesia Other Findings   Reproductive/Obstetrics                             Anesthesia Physical Anesthesia Plan  ASA: 3  Anesthesia Plan: General   Post-op Pain Management:    Induction: Intravenous  PONV Risk Score and Plan: Ondansetron and Dexamethasone  Airway Management Planned: Oral ETT  Additional Equipment:   Intra-op Plan:   Post-operative Plan: Extubation in OR  Informed Consent: I have reviewed the patients History and Physical, chart, labs and discussed the procedure including the risks, benefits and alternatives for the proposed anesthesia with the patient or authorized representative who has indicated his/her understanding and acceptance.     Dental advisory given  Plan Discussed with: CRNA and Anesthesiologist  Anesthesia Plan Comments:         Anesthesia Quick Evaluation

## 2021-12-09 NOTE — Anesthesia Postprocedure Evaluation (Signed)
Anesthesia Post Note  Patient: [name]  Procedure(s) Performed: APPENDECTOMY LAPAROSCOPIC (Abdomen)     Patient location during evaluation: PACU Anesthesia Type: General Level of consciousness: awake Pain management: pain level controlled Vital Signs Assessment: post-procedure vital signs reviewed and stable Cardiovascular status: stable Postop Assessment: no apparent nausea or vomiting Anesthetic complications: no   No notable events documented.  Last Vitals:  Vitals:   12/09/21 1345 12/09/21 1400  BP: (!) 144/55 (!) 144/56  Pulse: 70 66  Resp: 19 16  Temp:    SpO2: 94% 93%    Last Pain:  Vitals:   12/09/21 1345  TempSrc:   PainSc: 1                  Antoniette Peake

## 2021-12-09 NOTE — ED Notes (Signed)
Patient currently off CPAP. O2 sats 93% on RA.

## 2021-12-09 NOTE — Anesthesia Procedure Notes (Signed)
Procedure Name: Intubation Date/Time: 12/09/2021 12:14 PM Performed by: Betha Loa, CRNA Pre-anesthesia Checklist: Patient identified, Emergency Drugs available, Suction available and Patient being monitored Patient Re-evaluated:Patient Re-evaluated prior to induction Oxygen Delivery Method: Circle System Utilized Preoxygenation: Pre-oxygenation with 100% oxygen Induction Type: IV induction Ventilation: Mask ventilation without difficulty Laryngoscope Size: Mac and 3 Grade View: Grade II Tube type: Oral Tube size: 7.0 mm Number of attempts: 1 Airway Equipment and Method: Stylet and Oral airway Placement Confirmation: ETT inserted through vocal cords under direct vision, positive ETCO2 and breath sounds checked- equal and bilateral Secured at: 22 cm Tube secured with: Tape Dental Injury: Teeth and Oropharynx as per pre-operative assessment

## 2021-12-09 NOTE — Op Note (Signed)
[name] [id number]   PRE-OPERATIVE DIAGNOSIS:  Acute appendicitis  POST-OPERATIVE DIAGNOSIS:  Acute appendicitis without abscess or evident perforation  PROCEDURE: Laparoscopic appendectomy  SURGEON:  Sharon Mt. Sherika Kubicki, M.D.  ASSISTANT: OR staff  ANESTHESIA: General endotracheal  EBL:   68m  DRAINS: None  SPECIMEN:  Appendix  COUNTS:  Sponge, needle and instrument counts were reported correct x2 at conclusion of the operation  DISPOSITION:  PACU in satisfactory condition  COMPLICATIONS: None  FINDINGS: Acutely inflamed appendix with separation focally.  No evident perforation or abscess.  Appendectomy carried out.  DESCRIPTION:   The patient was identified & brought into the operating room. SCDs were in place and functioning. General endotracheal anesthesia was administered. Preoperative antibiotics were administered. The patient was positioned supine with left arm tucked. Hair on the abdomen was then clipped by the OR team. A foley catheter was inserted under sterile conditions. The abdomen was prepped and draped in the standard sterile fashion. A surgical timeout confirmed our plan.  A small incision was made in the supraumbilical skin well above her prior low midline cesarean. The subcutaneous tissue was dissected and the umbilical stalk identified. The stalk was grasped with a Kocher and retracted outwardly. The supraumbilical fascia was exposed and incised. Peritoneal entry was carefully made bluntly. A 0 Vicryl purse-string suture was placed and then the [facility]port was introduced into the abdomen.  CO2 insufflation commenced to 141mg. The laparoscope was inserted and confirmed no evidence of trocar site complications. The patient was then positioned in Trendelenburg. Two additional ports were placed -both in the left hemiabdomen given her prior surgical history.  She also has omental containing adhesions across her low midline.  These are all well below her  supraumbilical port. The bed was then slightly tilted to place the left side down.  The appendix is readily identified in the right lower quadrant.  There is suppurative type adhesions to the abdominal wall.  These are able to be bluntly freed without difficulty.  The suction irrigator device is used to evacuate the murky ascites at this location.  The abdomen is inspected.  There are no other evident findings.  She has a normal-appearing liver and gallbladder.  The anterior surface of the stomach is normal.  The peritoneal surfaces are normal.  The omentum is normal in appearance.  The appendix is elevated.  It is carefully freed from surrounding tissue bluntly without difficulty.  We began by creating a window at the base of the appendix where it attaches to the cecum.  Of note, the appendix is acutely inflamed in appearance, injected, erythematous, and clearly inflamed.  It is otherwise normal.  The cecum, terminal ileum, and ascending colon are normal in appearance.  After creating a window in the associated mesoappendix at the base of the appendix, a 45 mm blue load Echelon laparoscopic stapler was introduced into the abdomen.  The appendix is then ligated and divided with the stapler at its base, taking it flush with the cecum.  The staple line is then inspected noted to be hemostatic and well formed in appearance.  The mesoappendix was then ligated by "hugging" the appendix using the harmonic scalpel. The mesoappendix was inspected and noted to be hemostatic. The appendix was placed in an EndoBag.  The right lower quadrant was conservatively irrigated. Hemostasis was noted to be achieved - taking time to inspect the ligated mesoappendix, colon mesentery, and retroperitoneum. Staple line was noted to be intact on the cecum with no bleeding.  There was no perforation or injury. The right lower quadrant appeared clean and as such, no drain was placed.  The left-sided ports were removed under direct  visualization. The EndoBag was then removed through the umbilical port site and passed off as specimen. The CO2 was exhausted from the abdomen. The umbilical fascia was then closed by closing the 0 Vicryl suture. The fascia was palpated and noted to be completely closed. The skin of all port sites was then approximated using 4-0 Monocryl suture. The incisions were covered with Dermabond.  She was then awakened from general anesthesia, extubated, and transferred to a stretcher for transport to recover in satisfactory condition.

## 2021-12-09 NOTE — ED Notes (Signed)
US at bedside

## 2021-12-10 ENCOUNTER — Encounter (HOSPITAL_COMMUNITY): Payer: Self-pay | Admitting: Surgery

## 2021-12-10 DIAGNOSIS — K353 Acute appendicitis with localized peritonitis, without perforation or gangrene: Secondary | ICD-10-CM | POA: Diagnosis not present

## 2021-12-10 LAB — BASIC METABOLIC PANEL
Anion gap: 8 (ref 5–15)
BUN: 13 mg/dL (ref 8–23)
CO2: 23 mmol/L (ref 22–32)
Calcium: 8.5 mg/dL — ABNORMAL LOW (ref 8.9–10.3)
Chloride: 107 mmol/L (ref 98–111)
Creatinine, Ser: 0.94 mg/dL (ref 0.44–1.00)
GFR, Estimated: >60 mL/min (ref >60)
Glucose, Bld: 146 mg/dL — ABNORMAL HIGH (ref 70–99)
Potassium: 3.6 mmol/L (ref 3.5–5.1)
Sodium: 138 mmol/L (ref 135–145)

## 2021-12-10 LAB — CBC
HCT: 34.9 % — ABNORMAL LOW (ref 36.0–46.0)
Hemoglobin: 11.2 g/dL — ABNORMAL LOW (ref 12.0–15.0)
MCH: 32 pg (ref 26.0–34.0)
MCHC: 32.1 g/dL (ref 30.0–36.0)
MCV: 99.7 fL (ref 80.0–100.0)
Platelets: 160 10*3/uL (ref 150–400)
RBC: 3.5 MIL/uL — ABNORMAL LOW (ref 3.87–5.11)
RDW: 13.2 % (ref 11.5–15.5)
WBC: 9.3 10*3/uL (ref 4.0–10.5)
nRBC: 0 % (ref 0.0–0.2)

## 2021-12-10 LAB — SURGICAL PATHOLOGY

## 2021-12-10 NOTE — Care Management Obs Status (Signed)
[facility] NOTIFICATION   Patient Details  Name: [name] MRN: [id number] Date of Birth: [birth year]   Medicare Observation Status Notification Given:  Yes  Consent over phone verbal permission to sign.  Verdell Carmine, RN 12/10/2021, 1:34 PM

## 2021-12-10 NOTE — Progress Notes (Signed)
Mobility Specialist Progress Note:   12/10/21 1010  Mobility  Activity Ambulated with assistance in room  Level of Assistance Modified independent, requires aide device or extra time  Assistive Device Other (Comment) (IV Pole)  Distance Ambulated (ft) 30 ft  Activity Response Tolerated well  $Mobility charge 1 Mobility   Pt received in BR, no physical assistance required. Pt back in bed with all needs met.   Nelta Numbers Acute Rehab Secure Chat or Office Phone: [phone]

## 2021-12-10 NOTE — Plan of Care (Signed)

## 2021-12-10 NOTE — Discharge Instructions (Addendum)
[facility]  Please arrive at least 30 min before your appointment to complete your check in paperwork.  If you are unable to arrive 30 min prior to your appointment time we may have to cancel or reschedule you. LAPAROSCOPIC SURGERY: POST OP INSTRUCTIONS Always review your discharge instruction sheet given to you by the facility where your surgery was performed. IF YOU HAVE DISABILITY OR FAMILY LEAVE FORMS, YOU MUST BRING THEM TO THE OFFICE FOR PROCESSING.   DO NOT GIVE THEM TO YOUR DOCTOR.  PAIN CONTROL  First take acetaminophen (Tylenol) to control your pain after surgery.  Follow directions on package.  Taking acetaminophen (Tylenol) after surgery will help to control your pain and lower the amount of prescription pain medication you may need.  You should not take more than 3,000 mg (3 grams) of acetaminophen (Tylenol) in 24 hours.  You should not take ibuprofen (Advil), aleve, motrin, naprosyn or other NSAIDS if you have a history of stomach ulcers or chronic kidney disease.  A prescription for pain medication may be given to you upon discharge.  Take your pain medication as prescribed, if you still have uncontrolled pain after taking acetaminophen (Tylenol) Use ice packs to help control pain. If you need a refill on your pain medication, please contact your pharmacy.  They will contact our office to request authorization. Prescriptions will not be filled after 5pm or on week-ends.  HOME MEDICATIONS Take your usually prescribed medications unless otherwise directed.  DIET You should follow a light diet the first few days after arrival home.  Be sure to include lots of fluids daily. Avoid fatty, fried foods.   CONSTIPATION It is common to experience some constipation after surgery and if you are taking pain medication.  Increasing fluid intake and taking a stool softener (such as Colace) will usually help or prevent this problem from occurring.  A mild laxative (Milk  of Magnesia or Miralax) should be taken according to package instructions if there are no bowel movements after 48 hours.  WOUND/INCISION CARE Most patients will experience some swelling and bruising in the area of the incisions.  Ice packs will help.  Swelling and bruising can take several days to resolve.  Unless discharge instructions indicate otherwise, follow guidelines below  STERI-STRIPS - you may remove your outer bandages 48 hours after surgery, and you may shower at that time.  You have steri-strips (small skin tapes) in place directly over the incision.  These strips should be left on the skin for 7-10 days.   DERMABOND/SKIN GLUE - you may shower in 24 hours.  The glue will flake off over the next 2-3 weeks. Any sutures or staples will be removed at the office during your follow-up visit.  ACTIVITIES You may resume regular (light) daily activities beginning the next day--such as daily self-care, walking, climbing stairs--gradually increasing activities as tolerated.  You may have sexual intercourse when it is comfortable.  Refrain from any heavy lifting or straining until approved by your doctor. Do not lift greater than 15lbs for 2 weeks after surgery. You should not lift anything greater than 40lbs for 6 weeks after surgery. You may drive when you are no longer taking prescription pain medication, you can comfortably wear a seatbelt, and you can safely maneuver your car and apply brakes.  FOLLOW-UP You should see your doctor in the office for a follow-up appointment approximately 2-3 weeks after your surgery.  You should have been given your post-op/follow-up appointment when your  surgery was scheduled.  If you did not receive a post-op/follow-up appointment, make sure that you call for this appointment within a day or two after you arrive home to insure a convenient appointment time.   WHEN TO CALL YOUR DOCTOR: Fever over 101.0 Inability to urinate Continued bleeding from  incision. Increased pain, redness, or drainage from the incision. Increasing abdominal pain  The clinic staff is available to answer your questions during regular business hours.  Please don't hesitate to call and ask to speak to one of the nurses for clinical concerns.  If you have a medical emergency, go to the nearest emergency room or call 911.  A surgeon from [facility] is always on call at the hospital. [address] [place], [place], [place]  [address] ? [address], [place], [place]   [phone] ? [phone] ? FAX [phone]

## 2021-12-10 NOTE — Discharge Summary (Signed)
Patient ID: [name] [id number] Sep 26, 1940 81 y.o.  Admit date: 12/08/2021 Discharge date: 12/10/2021  Admitting Diagnosis: Acute appendicitis HTN Hypothyroidism CHF (now well compensated, presumed 2/2 HTN) OSA    Discharge Diagnosis Acute appendicitis s/p laparoscopic appendectomy  HTN Hypothyroidism CHF (now well compensated, presumed 2/2 HTN) OSA   Consultants None  HPI [name] is an 81 y.o. female with hx of HTN, hypothyroidism, CHF (now well compensated, presumed 2/2 HTN), OSA (uses CPAP at night) - presented to [facility] overnight with acute onset RLQ pain, began around noon 6/6. Never had this before. Pain is sharp, persistent. Some nausea, no emesis. No radiation. No aggravating/alleviating factors.   Procedures Laparoscopic appendectomy - Dr. Dema Severin, 12/09/21  Hospital Course:  Patient presented as above and was admitted for acute appendicitis. She underwent laparoscopic appendectomy by Dr. Dema Severin on 6/7 and tolerated the procedure well. AM labs reassuring. On POD 1 the patient was tolerating diet without n/v, ambulating well (see mobility specialists note, patient reports she uses cane and RW prn at baseline), VSS, pain well controlled, voiding and felt stable for discharge home. Follow up as noted below. Discussed discharge instructions, restrictions and return/call back precautions. Patient reports she already has Ultram at home from her Orthopedist and does not need an Rx sent.   Physical Exam: Gen:  Alert, NAD, pleasant HEENT: EOM's intact, pupils equal and round Card:  RRR, HR 60's on my exam Pulm:  CTAB, no W/R/R, effort normal Abd: Soft, ND, appropriately tender, no peritonitis, +BS, Incisions with glue intact appears well and are without drainage, bleeding, or signs of infection Ext:  No LE edema, mae's Psych: A&Ox3  Skin: no rashes noted, warm and dry  Allergies as of 12/10/2021       Reactions   Nebivolol Hcl Other (See Comments)    Hypotensive episode   Bactrim [sulfamethoxazole-trimethoprim] Other (See Comments)   UNSPECIFIED   Dairy Aid [tilactase] Diarrhea, Other (See Comments)   Ice cream, sour cream   Lactose Intolerance (gi) Diarrhea   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   albuterol 108 (90 Base) MCG/ACT inhaler Commonly known as: VENTOLIN HFA USE 2 INHALATIONS EVERY 6 HOURS AS NEEDED FOR WHEEZING OR SHORTNESS OF BREATH   B-complex with vitamin C tablet Take 1 tablet by mouth in the morning.   CALTRATE 600+D3 PO Take 1 tablet by mouth in the morning and at bedtime.   clobetasol ointment 0.05 % Commonly known as: TEMOVATE Apply 1 application topically 2 (two) times daily as needed (irritation).   diclofenac sodium 1 % Gel Commonly known as: VOLTAREN 1 application 4 (four) times daily as needed (pain.).   diltiazem 240 MG 24 hr capsule Commonly known as: CARDIZEM CD Take 240 mg by mouth in the morning.   diphenhydrAMINE 50 MG capsule Commonly known as: BENADRYL Take 100 mg by mouth See admin instructions. Take 2 capsules (50 mg) by mouth  up to 3 times daily   furosemide 80 MG tablet Commonly known as: LASIX Take 1 tablet (80 mg total) by mouth daily. Can take additional dose if weigh increase >2 lbs in 2 days What changed:  how much to take additional instructions   hydrALAZINE 50 MG tablet Commonly known as: APRESOLINE TAKE 1 TABLET THREE TIMES A DAY   KRILL OIL PO Take 1 capsule  by mouth daily.   levothyroxine 88 MCG tablet Commonly known as: SYNTHROID Take 88 mcg by mouth daily.   losartan 100 MG tablet Commonly known as: COZAAR Take 100 mg by mouth in the morning.   magnesium oxide 400 (240 Mg) MG tablet Commonly known as: MAG-OX Take 400 mg by mouth daily as needed (regularity (constipation)).   MELATONIN PO Take 2 Doses by mouth at bedtime. ZzzQuil Pure Zzzs Melatonin Sleep Aid Gummies   methocarbamol 500 MG tablet Commonly known as: ROBAXIN Take 500 mg by mouth in the morning, at noon, and at bedtime.   multivitamin with minerals tablet Take 1 tablet by mouth daily at 12 noon. Centrum Silver for Women   OrthoVisc 30 MG/2ML Sosy Generic drug: Hyaluronan Inject 6 mLs into the skin every 6 (six) months.   potassium chloride SA 20 MEQ tablet Commonly known as: Klor-Con M20 Take 3 tablets (60 mEq total) by mouth daily. What changed:  how much to take when to take this   Prolia 60 MG/ML Sosy injection Generic drug: denosumab 60 mg every 6 (six) months.   sertraline 100 MG tablet Commonly known as: ZOLOFT Take 100 mg by mouth 2 (two) times daily.   Systane 0.4-0.3 % Soln Generic drug: Polyethyl Glycol-Propyl Glycol Place 1-2 drops into both eyes 3 (three) times daily as needed (dry/irritated eyes.).   traMADol 50 MG tablet Commonly known as: ULTRAM Take 50 mg by mouth 3 (three) times daily as needed.          Follow-up Information     [facility] Call.   Specialty: General Surgery Why: Please call to confirm your appointment date and time. We are working hard to make this for you. Please bring a copy of your photo ID and insurance card. Please arrive 30 minutes prior to your appointment for paperwork. Contact information: [address] [place] [address] [phone]         Aura Dials, PA-C Follow up.   Specialty: Physician Assistant Contact information: [place] [address] [phone]                 Signed: Alferd Apa, [facility] 12/10/2021, 11:57 AM Please see Amion for pager number during day hours 7:00am-4:30pm

## 2021-12-10 NOTE — Progress Notes (Signed)
Mobility Specialist Progress Note:   12/10/21 0950  Mobility  Activity Ambulated with assistance in hallway  Level of Assistance Modified independent, requires aide device or extra time  Assistive Device Other (Comment) (IV Pole)  Distance Ambulated (ft) 550 ft  Activity Response Tolerated well  $Mobility charge 1 Mobility   Pt agreeable to mobility session. Required no physical assistance throughout. Ambulated at [place] level with IV Pole, needing x1 standing rest break d/t SOB (hx of asthma). Pt left in BR, will f/u to get back to bed.   [facility] Numbers Acute Rehab Secure Chat or Office Phone: [phone]

## 2021-12-12 ENCOUNTER — Ambulatory Visit
Admission: RE | Admit: 2021-12-12 | Discharge: 2021-12-12 | Disposition: A | Discharge status: Home / Self Care | Payer: Medicare HMO | Source: Ambulatory Visit | Attending: Orthopedic Surgery | Admitting: Orthopedic Surgery

## 2021-12-12 DIAGNOSIS — M546 Pain in thoracic spine: Secondary | ICD-10-CM

## 2021-12-12 DIAGNOSIS — M545 Low back pain, unspecified: Secondary | ICD-10-CM

## 2021-12-16 ENCOUNTER — Ambulatory Visit: Payer: Medicare HMO

## 2021-12-21 ENCOUNTER — Other Ambulatory Visit: Payer: Self-pay | Admitting: Endocrinology

## 2021-12-21 DIAGNOSIS — M810 Age-related osteoporosis without current pathological fracture: Secondary | ICD-10-CM

## 2021-12-23 ENCOUNTER — Ambulatory Visit: Payer: Medicare HMO | Attending: Orthopaedic Surgery

## 2021-12-23 DIAGNOSIS — M25512 Pain in left shoulder: Secondary | ICD-10-CM | POA: Insufficient documentation

## 2021-12-23 DIAGNOSIS — R262 Difficulty in walking, not elsewhere classified: Secondary | ICD-10-CM | POA: Insufficient documentation

## 2021-12-23 DIAGNOSIS — G8929 Other chronic pain: Secondary | ICD-10-CM | POA: Insufficient documentation

## 2021-12-23 DIAGNOSIS — M25551 Pain in right hip: Secondary | ICD-10-CM | POA: Insufficient documentation

## 2021-12-23 DIAGNOSIS — M6281 Muscle weakness (generalized): Secondary | ICD-10-CM | POA: Diagnosis present

## 2021-12-23 NOTE — Therapy (Signed)
OUTPATIENT PHYSICAL THERAPY TREATMENT NOTE    Patient Name: CHRYSTIAN RESSLER MRN: [id number] DOB:1940-11-04, 81 y.o., female Today's Date: 12/23/2021  PCP: [name] REFERRING PROVIDER: [name]   PT End of Session - 12/23/21 1303     Visit Number 14    Number of Visits 18    Date for PT Re-Evaluation 01/16/22    Authorization Type [employer] MCR    Authorization Time Period FOTO V6, V10, kx mod v15    Progress Note Due on Visit 20    PT Start Time 1304    PT Stop Time 1342    PT Time Calculation (min) 38 min    Activity Tolerance Patient tolerated treatment well    Behavior During Therapy WFL for tasks assessed/performed                         Past Medical History:  Diagnosis Date   Anemia    PMH;  Patient denies this dx as of 06/03/21   Anxiety    Asthma    Breast cancer ([place]) 1988   left breast cancer   Collagenous colitis    Compression fracture    lumbar 1   Dyspnea    with exertion   Hypertension    Hypothyroidism    Osteoarthritis    Osteopenia    Pneumonia 07/2020   PONV (postoperative nausea and vomiting)    Sleep apnea    wears CPAP nightly   Past Surgical History:  Procedure Laterality Date   BREAST REDUCTION SURGERY     Right   CATARACT EXTRACTION W/ INTRAOCULAR LENS  IMPLANT, BILATERAL     CESAREAN SECTION     COLONOSCOPY W/ BIOPSIES AND POLYPECTOMY     CYST EXCISION N/A 08/30/2018   Procedure: excision of posterior shoulder / back sebaceous cyst;  Surgeon: Wallace Going, DO;  Location: [place];  Service: Plastics;  Laterality: N/A;   KYPHOPLASTY N/A 02/18/2016   Procedure: LUMBAR 1 KYPHOPLASTY;  Surgeon: Phylliss Bob, MD;  Location: [place];  Service: Orthopedics;  Laterality: N/A;  LUMBAR 1 KYPHOPLASTY   KYPHOPLASTY N/A 06/04/2021   Procedure: LUMBAR 2 KYPHOPLASTY;  Surgeon: Phylliss Bob, MD;  Location: [place];  Service: Orthopedics;  Laterality: N/A;   LAPAROSCOPIC APPENDECTOMY N/A  12/09/2021   Procedure: APPENDECTOMY LAPAROSCOPIC;  Surgeon: Ileana Roup, MD;  Location: [place];  Service: General;  Laterality: N/A;   MODIFIED RADICAL MASTECTOMY W/ AXILLARY LYMPH NODE DISSECTION     Left   RECONSTRUCTION BREAST W/ LATISSIMUS DORSI FLAP     left   REDUCTION MAMMAPLASTY Right    right foot surgery     corrected hammer toe, straightened big toe   SEPTOPLASTY     TOE SURGERY     TONSILLECTOMY AND ADENOIDECTOMY     Patient Active Problem List   Diagnosis Date Noted   Acute appendicitis 12/09/2021   S/P laparoscopic appendectomy 12/09/2021   CAP (community acquired pneumonia) 09/30/2020   Chronic respiratory failure with hypoxia ([place]) 09/23/2020   Allergic rhinitis 08/31/2019   Pilar cyst 08/04/2018   History of breast cancer in female 06/09/2018   History of reconstruction of both breasts 06/09/2018   Severe obesity (BMI >= 40) ([place]) 09/16/2015   Asthma with acute exacerbation 09/15/2015   DOE (dyspnea on exertion) 01/11/2014   Insomnia 05/05/2012   Osteoarthritis    Osteopenia    Collagenous colitis    OSA (  obstructive sleep apnea)    Hypothyroidism    Mild persistent asthma    Hypertension    Breast cancer, left breast ([facility])     REFERRING DIAG: L chronic RCT  THERAPY DIAG:  Chronic left shoulder pain  Muscle weakness (generalized)  Pain in right hip  Difficulty in walking, not elsewhere classified  PERTINENT HISTORY: About 6 years ago, pt was sitting on a footstool and went to pick up a toy that had fallen to the floor and grandson began to slide out of a chair and she threw her arm up to prevent him from falling.  She heard a pop. She had therapy before that assisted.In the past couple of months, pain began again insidiously. Pt has a history of hip pain of which she has a 59 month old prescription for as well; wants to concentrate on L shoulder first. She reports having a transport w/c to assist with cooking.   PRECAUTIONS: None  SUBJECTIVE:  Pt presents to PT for first time in three weeks due to undergoing an appendectomy two week ago. She reports that today is the first day she has driven since this event. She also received a lumbar corticosteroid injection last week. She reports continued Lt knee pain, along with her Rt hip pain. She has a follow-up orthopedic appointment in regard to her knee pain this Friday.   PAIN:  Are you having pain? Yes: NPRS scale: 3/10 Pain location: Rt hip, Lt knee Pain description: Achy    OBJECTIVE:   *Unless otherwise noted, objective information collected previously*  DIAGNOSTIC FINDINGS:  MRI 09/06/21 IMPRESSION: 1. Moderate tendinosis of the supraspinatus tendon with a 10 mm full-thickness tear and 11 mm of retraction. 2. Moderate tendinosis of the infraspinatus tendon. 3. Moderate tendinosis of the subscapularis tendon. 4. Mild tendinosis of the intra-articular portion of the long head of the biceps tendon. 5. Severe osteoarthritis of the left glenohumeral joint.   PATIENT SURVEYS:  FOTO : 70, predicted 63 10/13/2021: 52% 11/14/2021: 66% (goal met)   COGNITION:           Overall cognitive status: Within functional limits for tasks assessed                                  SENSATION: WFL   POSTURE: Dowers hump, rounded shoulders bil, forward head.    UPPER EXTREMITY ROM:    Active ROM-measured sitting Right 09/19/2021 Left 09/19/2021 Left (supine) 10/13/2021  Shoulder flexion WFL 81 degrees 150 degrees with p!  Shoulder extension       Shoulder abduction WFL 40 deg with shoulder hike 160 degrees  Shoulder adduction       Shoulder internal rotation L4/5 L glute med  20 degrees  Shoulder external rotation T2 To L ear 60 degrees  Elbow flexion       Elbow extension       Wrist flexion       Wrist extension       Wrist ulnar deviation       Wrist radial deviation       Wrist pronation       Wrist supination       (Blank rows = not tested)   PROM: L shoulder supine:  flexion to 120, abduction to 140, IR to approx 40, ER to approx 60   UPPER EXTREMITY MMT:   MMT Right 09/19/2021 Left 09/19/2021 Right 10/13/2021 Left 10/13/2021 Right  11/14/2021 Left 11/14/2021  Shoulder flexion 3+/5 2-/5 3+/5 3/5 4+/5 4+/5  Shoulder extension          Shoulder abduction 3-/5 2-/5 4+/5 4+/5 5/5 5/5  Shoulder adduction          Shoulder internal rotation 3+/5 2-/5 5/5 5/5 5/5 5/5  Shoulder external rotation 3+/5 2-/5 5/5 4+/5 5/5 5/5  Middle trapezius          Lower trapezius          Elbow flexion 4+/5 3+/5 5/5 5/5    Elbow extension 4+/5 3/5 5/5 5/5    Wrist flexion          Wrist extension          Wrist ulnar deviation          Wrist radial deviation          Wrist pronation          Wrist supination          Grip strength (lbs)          (Blank rows = not tested)   Cervical AROM limited 25% all planes      PALPATION: 11/14/2021: Exquisite TTP to Rt greater trochanter  LE ROM:  A/PROM Right 11/14/2021 Left 11/14/2021  Hip flexion 90/120 95/120  Hip abduction 50p! 55/60  Hip adduction 15/20 15/20  Hip internal rotation    Hip external rotation     (Blank rows = not tested)  LE MMT:  MMT Right 11/14/2021 Left 11/14/2021 Right 12/23/2021 Left 12/23/2021  Hip flexion 3/5 3/5 4/5 4/5  Hip extension 3-/5 3-/5    Hip abduction 3-/5 3-/5 3/5 3/5  Hip internal rotation 3/5 4/5 4/5 4+/5  Hip external rotation 3/5 3/5 4/5 3/5p!   (Blank rows = not tested)  LOWER EXTREMITY SPECIAL TESTS:  Ober's (11/14/2021): (+) on Rt  FUNCTIONAL TESTS:  5xSTS (11/14/2021): 15 seconds     SHOULDER SPECIAL TESTS:            Impingement tests: Plocher/Kennedy impingement test: positive  Rotator cuff assessment: Drop arm test: negative, Empty can test: positive , Full can test: negative, and see diagnostic section     PALPATION:  L supraspinatus groove can be easily palpated compared to contralateral side supine; scapulae equal             TODAY'S TREATMENT:    [facility] Adult PT Treatment:                                                DATE: 12/23/2021 Therapeutic Exercise: Bridge isometrics 6x10 second holds Seated marching with GTB around forefeet 3x10 BIL Seated BIL hip IR with GTB around ankles 2x10 Seated clamshells with GTB above knees 2x10 Standing hip extension with GTB around ankles 2x10 BIL Manual Therapy: N/A Neuromuscular re-ed: N/A Therapeutic Activity: Pt education regarding POC, prognosis Re-assessment of objective measures Modalities: N/A Self Care: N/A   [facility] Adult PT Treatment:                                                DATE: 12/02/2021 Therapeutic Exercise: 6 minutes Nustep warm-up x5 minutes while collecting subjective information Standing hip extension with 7# cable to  ankle attachment at Free Motion machine 2x10 BIL Standing hip abduction with 3# cable to ankle attachment at Free Motion machine 2x10 BIL Standing hip flexion with 3# cable to ankle attachment at Free Motion machine 2x10 BIL Standing hamstring curl with 7# cable to ankle attachment at Free Motion machine 2x10 BIL Seated piriformis stretch x31mn BIL Manual Therapy: N/A Neuromuscular re-ed: N/A Therapeutic Activity: N/A Modalities: N/A Self Care: N/A   [facility]                                                [facility]: [facility] Therapeutic Exercise: 6 minutes Nustep warm-up x5 minutes while collecting subjective information Hooklying march with sequential knee extension upon lowering 2x10 BIL Sidelying hip abduction 2x10 with 3-sec hold BIL PT-assisted Thomas stretch x178m BIL Bridge 3x10 with 3-sec holds Seated low rows with 25# 2x10 Manual Therapy: N/A Neuromuscular re-ed: N/A Therapeutic Activity: N/A Modalities: N/A Self Care: N/A      PATIENT EDUCATION: Education details: reviewed PT POC, explained what tendonitis is Person educated: Patient Education method: Explanation Education comprehension: verbalized  understanding     HOME EXERCISE PROGRAM:  Access Code: [email or web address] URL: [email or web address] Date: 09/24/2021 Prepared by: AsEdythe LynnExercises - Supine Shoulder Flexion AAROM with Dowel  - 1 x daily - 7 x weekly - 2 sets - 10 reps - Supine Shoulder External Rotation in 45 Degrees Abduction AAROM with Dowel  - 1 x daily - 7 x weekly - 2 sets - 10 reps - Seated Scapular Retraction  - 1 x daily - 7 x weekly - 2 sets - 10 reps - Circular Shoulder Pendulum with Table Support  - 1 x daily - 7 x weekly - 2 sets - 10 reps  Added 11/14/2021: - Squat with Chair Touch  - 1 x daily - 7 x weekly - 3 sets - 10 reps - Standing ITB Stretch  - 1 x daily - 7 x weekly - 2-min hold - Sidelying Hip Abduction  - 1 x daily - 7 x weekly - 3 sets - 10 reps     ASSESSMENT:   CLINICAL IMPRESSION: Pt responded well to initial exercises today, which were regressed due to pt's post-op status and increased knee and hip pain today. Upon re-assessment of global hip strength, the pt has made excellent improvements despite not performing exercises the pas two weeks following an appendectomy. She will continue to benefit from skilled PT to address her primary impairments and return to her prior level of function with less limitation.     OBJECTIVE IMPAIRMENTS decreased activity tolerance, decreased mobility, decreased ROM, decreased strength, impaired UE functional use, postural dysfunction, and pain.    ACTIVITY LIMITATIONS cleaning, meal prep, shopping, and using L UE for fxnal activities .    PERSONAL FACTORS 3+ comorbidities: L breast cancer history, osteopenia, asthma, rotator cuff complex impairments  are also affecting patient's functional outcome.          GOALS: Goals reviewed with patient? Yes   SHORT TERM GOALS: Target date: 10/17/2021   Pt will be indep with initial HEP to assist with functional L UE strengthening Baseline: not initiated at eval due to time constraints Goal  status: ACHIEVED 10/13/2021: Pt reports daily adherence to her HEP   2.  Pt will be able to hold the cell phone with L  UE without resting it x 15 min Baseline: 10 min Goal status: ACHIEVED 10/13/2021: ACHIEVED           LONG TERM GOALS: Target date: 10/31/21   Pt will sleep in R sidelying at night >/=6 hours without waking up with L UE pain Baseline: 4-5 hours Goal status: ACHIEVED 10/13/2021: Pt reports no sleep disturbances due to pain the past week   2.  Pt will put deodorant under L arm with </= 4/10 pain Baseline: 4/10-8/10 11/14/2021: Achieved Goal status: ACHIEVED    3.  Pt will be able to roll hair with 75% less difficulty using L UE Baseline: 85% difficulty 11/14/2021: 20% difficulty Goal status: ACHIEVED    4.  Pt will demonstrate improved L UE AROM flex/abdct/ER by 10 degrees to assist with functional mobility Baseline: flexion 81 deg, abdct 40 deg, ER to L ear measured seated Goal status: ACHIEVED 10/13/2021: See updated MMT chart   5. Pt will demo improved L UE strength to assist with functional mobility by >/= 1/2 MMT grade            Baseline:elbow flex/abdct 2-/5, elbow flex 3+/5            Goal status: ACHIEVED 10/13/2021: See updated MMT chart  6. *New 11/14/2021*   Pt will achieve BIL global hip strength of 4+/5 in order to progress her independent LE strengthening program with less limitation.  Baseline: See MMT chart 12/23/2021: See updated MMT chart  Goal Status: IN PROGRESS  7. *New 11/14/2021*  Pt will achieve a 5xSTS of 12 seconds or less in order to promote safe transfers.  Baseline: 15 seconds:  Goal Status: INITIAL  8. *New 11/14/2021*  Pt will achieve full, pain-free Rt hip abduction AROM in order to get into her car with less limitation.  Baseline: 50d with 6/10 pain  Goal Status: INITIAL       PLAN: PT FREQUENCY: 1x/week   PT DURATION: 8 weeks   PLANNED INTERVENTIONS: Therapeutic exercises, Therapeutic activity, Patient/Family  education, Joint mobilization, Cryotherapy, Moist heat, Taping, Aquatic Therapy, Dry Needling, Ionotophoresis 57m/ml Dexamethasone, and Manual therapy. Hold on IONTO until signed off by MD for approval due to breast CA history.    PLAN FOR NEXT SESSION: f/u on doctor's appointment for lumbar compression fx, progress low-level hip strengthening   YVanessa Raceland PT, DPT 12/23/21 1:43 PM

## 2021-12-28 ENCOUNTER — Other Ambulatory Visit: Payer: Self-pay | Admitting: Orthopaedic Surgery

## 2021-12-28 DIAGNOSIS — M25562 Pain in left knee: Secondary | ICD-10-CM

## 2021-12-28 DIAGNOSIS — M25561 Pain in right knee: Secondary | ICD-10-CM

## 2021-12-30 ENCOUNTER — Ambulatory Visit: Payer: Medicare HMO

## 2021-12-30 DIAGNOSIS — G8929 Other chronic pain: Secondary | ICD-10-CM

## 2021-12-30 DIAGNOSIS — M25512 Pain in left shoulder: Secondary | ICD-10-CM | POA: Diagnosis not present

## 2021-12-30 DIAGNOSIS — R262 Difficulty in walking, not elsewhere classified: Secondary | ICD-10-CM

## 2021-12-30 DIAGNOSIS — M25551 Pain in right hip: Secondary | ICD-10-CM

## 2021-12-30 DIAGNOSIS — M6281 Muscle weakness (generalized): Secondary | ICD-10-CM

## 2021-12-30 NOTE — Therapy (Signed)
OUTPATIENT PHYSICAL THERAPY TREATMENT NOTE    Patient Name: [name] MRN: [id number] DOB:Dec 21, 1940, 81 y.o., female Today's Date: 12/30/2021  PCP: [name] REFERRING PROVIDER: [name]   PT End of Session - 12/30/21 1318     Visit Number 15    Number of Visits 18    Date for PT Re-Evaluation 01/16/22    Authorization Type [employer] MCR    Authorization Time Period FOTO V6, V10, kx mod v15    Progress Note Due on Visit 20    PT Start Time 1303    PT Stop Time 1343    PT Time Calculation (min) 40 min    Activity Tolerance Patient tolerated treatment well    Behavior During Therapy [facility] for tasks assessed/performed                          Past Medical History:  Diagnosis Date   Anemia    PMH;  Patient denies this dx as of 06/03/21   Anxiety    Asthma    Breast cancer ([place]) 1988   left breast cancer   Collagenous colitis    Compression fracture    lumbar 1   Dyspnea    with exertion   Hypertension    Hypothyroidism    Osteoarthritis    Osteopenia    Pneumonia 07/2020   PONV (postoperative nausea and vomiting)    Sleep apnea    wears CPAP nightly   Past Surgical History:  Procedure Laterality Date   BREAST REDUCTION SURGERY     Right   CATARACT EXTRACTION W/ INTRAOCULAR LENS  IMPLANT, BILATERAL     CESAREAN SECTION     COLONOSCOPY W/ BIOPSIES AND POLYPECTOMY     CYST EXCISION N/A 08/30/2018   Procedure: excision of posterior shoulder / back sebaceous cyst;  Surgeon: Wallace Going, DO;  Location: [place];  Service: Plastics;  Laterality: N/A;   KYPHOPLASTY N/A 02/18/2016   Procedure: LUMBAR 1 KYPHOPLASTY;  Surgeon: Phylliss Bob, MD;  Location: [place];  Service: Orthopedics;  Laterality: N/A;  LUMBAR 1 KYPHOPLASTY   KYPHOPLASTY N/A 06/04/2021   Procedure: LUMBAR 2 KYPHOPLASTY;  Surgeon: Phylliss Bob, MD;  Location: [place];  Service: Orthopedics;  Laterality: N/A;   LAPAROSCOPIC APPENDECTOMY  N/A 12/09/2021   Procedure: APPENDECTOMY LAPAROSCOPIC;  Surgeon: Ileana Roup, MD;  Location: [place];  Service: General;  Laterality: N/A;   MODIFIED RADICAL MASTECTOMY W/ AXILLARY LYMPH NODE DISSECTION     Left   RECONSTRUCTION BREAST W/ LATISSIMUS DORSI FLAP     left   REDUCTION MAMMAPLASTY Right    right foot surgery     corrected hammer toe, straightened big toe   SEPTOPLASTY     TOE SURGERY     TONSILLECTOMY AND ADENOIDECTOMY     Patient Active Problem List   Diagnosis Date Noted   Acute appendicitis 12/09/2021   S/P laparoscopic appendectomy 12/09/2021   CAP (community acquired pneumonia) 09/30/2020   Chronic respiratory failure with hypoxia ([place]) 09/23/2020   Allergic rhinitis 08/31/2019   Pilar cyst 08/04/2018   History of breast cancer in female 06/09/2018   History of reconstruction of both breasts 06/09/2018   Severe obesity (BMI >= 40) ([place]) 09/16/2015   Asthma with acute exacerbation 09/15/2015   DOE (dyspnea on exertion) 01/11/2014   Insomnia 05/05/2012   Osteoarthritis    Osteopenia    Collagenous colitis  OSA (obstructive sleep apnea)    Hypothyroidism    Mild persistent asthma    Hypertension    Breast cancer, left breast (HCC)     REFERRING DIAG: L chronic RCT  THERAPY DIAG:  Chronic left shoulder pain  Muscle weakness (generalized)  Pain in right hip  Difficulty in walking, not elsewhere classified  PERTINENT HISTORY: About 6 years ago, pt was sitting on a footstool and went to pick up a toy that had fallen to the floor and grandson began to slide out of a chair and she threw her arm up to prevent him from falling.  She heard a pop. She had therapy before that assisted.In the past couple of months, pain began again insidiously. Pt has a history of hip pain of which she has a 69 month old prescription for as well; wants to concentrate on L shoulder first. She reports having a transport w/c to assist with cooking.   PRECAUTIONS:  None  SUBJECTIVE: Pt reports going to see Dr. Rhona Raider on Friday concerning her Lt knee pain. She is now scheduled for a Lt knee MRI on July 29th. She rates her Lt knee pain as a 3/10. She denies any hip pain today, although she has increased pain in the evening after standing and cooking dinner. She reports non-adherence to her HEP.    PAIN:  Are you having pain? Yes: NPRS scale: 3/10 Pain location: Lt knee Pain description: [place]    OBJECTIVE:   *Unless otherwise noted, objective information collected previously*  DIAGNOSTIC FINDINGS:  MRI 09/06/21 IMPRESSION: 1. Moderate tendinosis of the supraspinatus tendon with a 10 mm full-thickness tear and 11 mm of retraction. 2. Moderate tendinosis of the infraspinatus tendon. 3. Moderate tendinosis of the subscapularis tendon. 4. Mild tendinosis of the intra-articular portion of the long head of the biceps tendon. 5. Severe osteoarthritis of the left glenohumeral joint.   PATIENT SURVEYS:  FOTO : 76, predicted 63 10/13/2021: 52% 11/14/2021: 66% (goal met)   COGNITION:           Overall cognitive status: Within functional limits for tasks assessed                                  SENSATION: WFL   POSTURE: Dowers hump, rounded shoulders bil, forward head.    UPPER EXTREMITY ROM:    Active ROM-measured sitting Right 09/19/2021 Left 09/19/2021 Left (supine) 10/13/2021  Shoulder flexion WFL 81 degrees 150 degrees with p!  Shoulder extension       Shoulder abduction WFL 40 deg with shoulder hike 160 degrees  Shoulder adduction       Shoulder internal rotation L4/5 L glute med  20 degrees  Shoulder external rotation T2 To L ear 60 degrees  Elbow flexion       Elbow extension       Wrist flexion       Wrist extension       Wrist ulnar deviation       Wrist radial deviation       Wrist pronation       Wrist supination       (Blank rows = not tested)   PROM: L shoulder supine: flexion to 120, abduction to 140, IR to approx 40, ER  to approx 60   UPPER EXTREMITY MMT:   MMT Right 09/19/2021 Left 09/19/2021 Right 10/13/2021 Left 10/13/2021 Right 11/14/2021 Left 11/14/2021  Shoulder flexion 3+/5  2-/5 3+/5 3/5 4+/5 4+/5  Shoulder extension          Shoulder abduction 3-/5 2-/5 4+/5 4+/5 5/5 5/5  Shoulder adduction          Shoulder internal rotation 3+/5 2-/5 5/5 5/5 5/5 5/5  Shoulder external rotation 3+/5 2-/5 5/5 4+/5 5/5 5/5  Middle trapezius          Lower trapezius          Elbow flexion 4+/5 3+/5 5/5 5/5    Elbow extension 4+/5 3/5 5/5 5/5    Wrist flexion          Wrist extension          Wrist ulnar deviation          Wrist radial deviation          Wrist pronation          Wrist supination          Grip strength (lbs)          (Blank rows = not tested)   Cervical AROM limited 25% all planes      PALPATION: 11/14/2021: Exquisite TTP to Rt greater trochanter  LE ROM:  A/PROM Right 11/14/2021 Left 11/14/2021  Hip flexion 90/120 95/120  Hip abduction 50p! 55/60  Hip adduction 15/20 15/20  Hip internal rotation    Hip external rotation     (Blank rows = not tested)  LE MMT:  MMT Right 11/14/2021 Left 11/14/2021 Right 12/23/2021 Left 12/23/2021  Hip flexion 3/5 3/5 4/5 4/5  Hip extension 3-/5 3-/5    Hip abduction 3-/5 3-/5 3/5 3/5  Hip internal rotation 3/5 4/5 4/5 4+/5  Hip external rotation 3/5 3/5 4/5 3/5p!   (Blank rows = not tested)  LOWER EXTREMITY SPECIAL TESTS:  Ober's (11/14/2021): (+) on Rt  FUNCTIONAL TESTS:  5xSTS (11/14/2021): 15 seconds     SHOULDER SPECIAL TESTS:            Impingement tests: Spratley/Kennedy impingement test: positive  Rotator cuff assessment: Drop arm test: negative, Empty can test: positive , Full can test: negative, and see diagnostic section     PALPATION:  L supraspinatus groove can be easily palpated compared to contralateral side supine; scapulae equal             TODAY'S TREATMENT:   [facility] Adult PT Treatment:                                                 DATE: 12/30/2021 Therapeutic Exercise: 6 minutes Nustep warm-up x5 minutes while collecting subjective information Bridge isometric with marching 3x20 Supine diaphragmatic breathing with abdominal brace 2x20 breaths Supine SLR with hip abduction with GTB around thighs 2x10 BIL Prone hip extension with GTB around thighs 2x10 BIL Squat to mat table 2x10 Manual Therapy: N/A Neuromuscular re-ed: N/A Therapeutic Activity: N/A Modalities: N/A Self Care: N/A   [facility] Adult PT Treatment:                                                DATE: 12/23/2021 Therapeutic Exercise: Bridge isometrics 6x10 second holds Seated marching with GTB around forefeet 3x10 BIL Seated BIL hip IR with GTB around ankles 2x10  Seated clamshells with GTB above knees 2x10 Standing hip extension with GTB around ankles 2x10 BIL Manual Therapy: N/A Neuromuscular re-ed: N/A Therapeutic Activity: Pt education regarding POC, prognosis Re-assessment of objective measures Modalities: N/A Self Care: N/A   [facility] Adult PT Treatment:                                                DATE: 12/02/2021 Therapeutic Exercise: 6 minutes Nustep warm-up x5 minutes while collecting subjective information Standing hip extension with 7# cable to ankle attachment at Free Motion machine 2x10 BIL Standing hip abduction with 3# cable to ankle attachment at Free Motion machine 2x10 BIL Standing hip flexion with 3# cable to ankle attachment at Free Motion machine 2x10 BIL Standing hamstring curl with 7# cable to ankle attachment at Free Motion machine 2x10 BIL Seated piriformis stretch x69mn BIL Manual Therapy: N/A Neuromuscular re-ed: N/A Therapeutic Activity: N/A Modalities: N/A Self Care: N/A        PATIENT EDUCATION: Education details: reviewed PT POC, explained what tendonitis is Person educated: Patient Education method: Explanation Education comprehension: verbalized understanding     HOME  EXERCISE PROGRAM:  Access Code: [email or web address] URL: [email or web address] Date: 09/24/2021 Prepared by: AEdythe Lynn Exercises - Supine Shoulder Flexion AAROM with Dowel  - 1 x daily - 7 x weekly - 2 sets - 10 reps - Supine Shoulder External Rotation in 45 Degrees Abduction AAROM with Dowel  - 1 x daily - 7 x weekly - 2 sets - 10 reps - Seated Scapular Retraction  - 1 x daily - 7 x weekly - 2 sets - 10 reps - Circular Shoulder Pendulum with Table Support  - 1 x daily - 7 x weekly - 2 sets - 10 reps  Added 11/14/2021: - Squat with Chair Touch  - 1 x daily - 7 x weekly - 3 sets - 10 reps - Standing ITB Stretch  - 1 x daily - 7 x weekly - 2-min hold - Sidelying Hip Abduction  - 1 x daily - 7 x weekly - 3 sets - 10 reps     ASSESSMENT:   CLINICAL IMPRESSION: Pt responded well to progressed hip strengthening exercises today, demonstrating good form and no increase in pain. She will continue to benefit from skilled PT to address her primary impairments and return to her PLOF with less limitation.     OBJECTIVE IMPAIRMENTS decreased activity tolerance, decreased mobility, decreased ROM, decreased strength, impaired UE functional use, postural dysfunction, and pain.    ACTIVITY LIMITATIONS cleaning, meal prep, shopping, and using L UE for fxnal activities .    PERSONAL FACTORS 3+ comorbidities: L breast cancer history, osteopenia, asthma, rotator cuff complex impairments  are also affecting patient's functional outcome.          GOALS: Goals reviewed with patient? Yes   SHORT TERM GOALS: Target date: 10/17/2021   Pt will be indep with initial HEP to assist with functional L UE strengthening Baseline: not initiated at eval due to time constraints Goal status: ACHIEVED 10/13/2021: Pt reports daily adherence to her HEP   2.  Pt will be able to hold the cell phone with L UE without resting it x 15 min Baseline: 10 min Goal status: ACHIEVED 10/13/2021: ACHIEVED            LONG TERM GOALS: Target date: 10/31/21  Pt will sleep in R sidelying at night >/=6 hours without waking up with L UE pain Baseline: 4-5 hours Goal status: ACHIEVED 10/13/2021: Pt reports no sleep disturbances due to pain the past week   2.  Pt will put deodorant under L arm with </= 4/10 pain Baseline: 4/10-8/10 11/14/2021: Achieved Goal status: ACHIEVED    3.  Pt will be able to roll hair with 75% less difficulty using L UE Baseline: 85% difficulty 11/14/2021: 20% difficulty Goal status: ACHIEVED    4.  Pt will demonstrate improved L UE AROM flex/abdct/ER by 10 degrees to assist with functional mobility Baseline: flexion 81 deg, abdct 40 deg, ER to L ear measured seated Goal status: ACHIEVED 10/13/2021: See updated MMT chart   5. Pt will demo improved L UE strength to assist with functional mobility by >/= 1/2 MMT grade            Baseline:elbow flex/abdct 2-/5, elbow flex 3+/5            Goal status: ACHIEVED 10/13/2021: See updated MMT chart  6. *New 11/14/2021*   Pt will achieve BIL global hip strength of 4+/5 in order to progress her independent LE strengthening program with less limitation.  Baseline: See MMT chart 12/23/2021: See updated MMT chart  Goal Status: IN PROGRESS  7. *New 11/14/2021*  Pt will achieve a 5xSTS of 12 seconds or less in order to promote safe transfers.  Baseline: 15 seconds:  Goal Status: INITIAL  8. *New 11/14/2021*  Pt will achieve full, pain-free Rt hip abduction AROM in order to get into her car with less limitation.  Baseline: 50d with 6/10 pain  Goal Status: INITIAL       PLAN: PT FREQUENCY: 1x/week   PT DURATION: 8 weeks   PLANNED INTERVENTIONS: Therapeutic exercises, Therapeutic activity, Patient/Family education, Joint mobilization, Cryotherapy, Moist heat, Taping, Aquatic Therapy, Dry Needling, Ionotophoresis 65m/ml Dexamethasone, and Manual therapy. Hold on IONTO until signed off by MD for approval due to breast CA history.     PLAN FOR NEXT SESSION: f/u on doctor's appointment for lumbar compression fx, progress low-level hip strengthening   YVanessa [place] PT, DPT 12/30/21 1:49 PM

## 2022-01-06 ENCOUNTER — Ambulatory Visit: Payer: Medicare HMO | Attending: Orthopaedic Surgery

## 2022-01-06 DIAGNOSIS — M25512 Pain in left shoulder: Secondary | ICD-10-CM | POA: Insufficient documentation

## 2022-01-06 DIAGNOSIS — G8929 Other chronic pain: Secondary | ICD-10-CM | POA: Diagnosis present

## 2022-01-06 DIAGNOSIS — M25551 Pain in right hip: Secondary | ICD-10-CM

## 2022-01-06 DIAGNOSIS — M6281 Muscle weakness (generalized): Secondary | ICD-10-CM | POA: Diagnosis present

## 2022-01-06 DIAGNOSIS — R262 Difficulty in walking, not elsewhere classified: Secondary | ICD-10-CM

## 2022-01-06 NOTE — Therapy (Signed)
OUTPATIENT PHYSICAL THERAPY TREATMENT NOTE    Patient Name: [name] MRN: [id number] DOB:Jan 09, 1941, 81 y.o., female Today's Date: 01/06/2022  PCP: [name] REFERRING PROVIDER: [name]   PT End of Session - 01/06/22 1301     Visit Number 16    Number of Visits 18    Date for PT Re-Evaluation 01/16/22    Authorization Type [employer] MCR    Authorization Time Period FOTO V6, V10, kx mod v15    Progress Note Due on Visit 20    PT Start Time 1302    PT Stop Time 1345    PT Time Calculation (min) 43 min    Activity Tolerance Patient tolerated treatment well    Behavior During Therapy WFL for tasks assessed/performed                           Past Medical History:  Diagnosis Date   Anemia    PMH;  Patient denies this dx as of 06/03/21   Anxiety    Asthma    Breast cancer ([place]) 1988   left breast cancer   Collagenous colitis    Compression fracture    lumbar 1   Dyspnea    with exertion   Hypertension    Hypothyroidism    Osteoarthritis    Osteopenia    Pneumonia 07/2020   PONV (postoperative nausea and vomiting)    Sleep apnea    wears CPAP nightly   Past Surgical History:  Procedure Laterality Date   BREAST REDUCTION SURGERY     Right   CATARACT EXTRACTION W/ INTRAOCULAR LENS  IMPLANT, BILATERAL     CESAREAN SECTION     COLONOSCOPY W/ BIOPSIES AND POLYPECTOMY     CYST EXCISION N/A 08/30/2018   Procedure: excision of posterior shoulder / back sebaceous cyst;  Surgeon: Wallace Going, DO;  Location: [facility];  Service: Plastics;  Laterality: N/A;   KYPHOPLASTY N/A 02/18/2016   Procedure: LUMBAR 1 KYPHOPLASTY;  Surgeon: Phylliss Bob, MD;  Location: [facility];  Service: Orthopedics;  Laterality: N/A;  LUMBAR 1 KYPHOPLASTY   KYPHOPLASTY N/A 06/04/2021   Procedure: LUMBAR 2 KYPHOPLASTY;  Surgeon: Phylliss Bob, MD;  Location: [facility];  Service: Orthopedics;  Laterality: N/A;   LAPAROSCOPIC APPENDECTOMY  N/A 12/09/2021   Procedure: APPENDECTOMY LAPAROSCOPIC;  Surgeon: Ileana Roup, MD;  Location: [facility];  Service: General;  Laterality: N/A;   MODIFIED RADICAL MASTECTOMY W/ AXILLARY LYMPH NODE DISSECTION     Left   RECONSTRUCTION BREAST W/ LATISSIMUS DORSI FLAP     left   REDUCTION MAMMAPLASTY Right    right foot surgery     corrected hammer toe, straightened big toe   SEPTOPLASTY     TOE SURGERY     TONSILLECTOMY AND ADENOIDECTOMY     Patient Active Problem List   Diagnosis Date Noted   Acute appendicitis 12/09/2021   S/P laparoscopic appendectomy 12/09/2021   CAP (community acquired pneumonia) 09/30/2020   Chronic respiratory failure with hypoxia ([place]) 09/23/2020   Allergic rhinitis 08/31/2019   Pilar cyst 08/04/2018   History of breast cancer in female 06/09/2018   History of reconstruction of both breasts 06/09/2018   Severe obesity (BMI >= 40) ([place]) 09/16/2015   Asthma with acute exacerbation 09/15/2015   DOE (dyspnea on exertion) 01/11/2014   Insomnia 05/05/2012   Osteoarthritis    Osteopenia    Collagenous colitis  OSA (obstructive sleep apnea)    Hypothyroidism    Mild persistent asthma    Hypertension    Breast cancer, left breast ([facility])     REFERRING DIAG: L chronic RCT  THERAPY DIAG:  Chronic left shoulder pain  Muscle weakness (generalized)  Pain in right hip  Difficulty in walking, not elsewhere classified  PERTINENT HISTORY: About 6 years ago, pt was sitting on a footstool and went to pick up a toy that had fallen to the floor and grandson began to slide out of a chair and she threw her arm up to prevent him from falling.  She heard a pop. She had therapy before that assisted.In the past couple of months, pain began again insidiously. Pt has a history of hip pain of which she has a 23 month old prescription for as well; wants to concentrate on L shoulder first. She reports having a transport w/c to assist with cooking.   PRECAUTIONS:  None  SUBJECTIVE: Pt reports she got her Lt knee MRI moved to this Sunday, with another MRI for her back scheduled in a couple of weeks. She reports no hip pain today, only Lt knee pain.    PAIN:  Are you having pain? Yes: NPRS scale: 3/10 Pain location: Lt knee Pain description: [place]    OBJECTIVE:   *Unless otherwise noted, objective information collected previously*  DIAGNOSTIC FINDINGS:  MRI 09/06/21 IMPRESSION: 1. Moderate tendinosis of the supraspinatus tendon with a 10 mm full-thickness tear and 11 mm of retraction. 2. Moderate tendinosis of the infraspinatus tendon. 3. Moderate tendinosis of the subscapularis tendon. 4. Mild tendinosis of the intra-articular portion of the long head of the biceps tendon. 5. Severe osteoarthritis of the left glenohumeral joint.   PATIENT SURVEYS:  FOTO : 39, predicted 63 10/13/2021: 52% 11/14/2021: 66% (goal met)   COGNITION:           Overall cognitive status: Within functional limits for tasks assessed                                  SENSATION: WFL   POSTURE: Dowers hump, rounded shoulders bil, forward head.    UPPER EXTREMITY ROM:    Active ROM-measured sitting Right 09/19/2021 Left 09/19/2021 Left (supine) 10/13/2021  Shoulder flexion WFL 81 degrees 150 degrees with p!  Shoulder extension       Shoulder abduction WFL 40 deg with shoulder hike 160 degrees  Shoulder adduction       Shoulder internal rotation L4/5 L glute med  20 degrees  Shoulder external rotation T2 To L ear 60 degrees  Elbow flexion       Elbow extension       Wrist flexion       Wrist extension       Wrist ulnar deviation       Wrist radial deviation       Wrist pronation       Wrist supination       (Blank rows = not tested)   PROM: L shoulder supine: flexion to 120, abduction to 140, IR to approx 40, ER to approx 60   UPPER EXTREMITY MMT:   MMT Right 09/19/2021 Left 09/19/2021 Right 10/13/2021 Left 10/13/2021 Right 11/14/2021 Left 11/14/2021   Shoulder flexion 3+/5 2-/5 3+/5 3/5 4+/5 4+/5  Shoulder extension          Shoulder abduction 3-/5 2-/5 4+/5 4+/5 5/5 5/5  Shoulder adduction          Shoulder internal rotation 3+/5 2-/5 5/5 5/5 5/5 5/5  Shoulder external rotation 3+/5 2-/5 5/5 4+/5 5/5 5/5  Middle trapezius          Lower trapezius          Elbow flexion 4+/5 3+/5 5/5 5/5    Elbow extension 4+/5 3/5 5/5 5/5    Wrist flexion          Wrist extension          Wrist ulnar deviation          Wrist radial deviation          Wrist pronation          Wrist supination          Grip strength (lbs)          (Blank rows = not tested)   Cervical AROM limited 25% all planes      PALPATION: 11/14/2021: Exquisite TTP to Rt greater trochanter  LE ROM:  A/PROM Right 11/14/2021 Left 11/14/2021  Hip flexion 90/120 95/120  Hip abduction 50p! 55/60  Hip adduction 15/20 15/20  Hip internal rotation    Hip external rotation     (Blank rows = not tested)  LE MMT:  MMT Right 11/14/2021 Left 11/14/2021 Right 12/23/2021 Left 12/23/2021  Hip flexion 3/5 3/5 4/5 4/5  Hip extension 3-/5 3-/5    Hip abduction 3-/5 3-/5 3/5 3/5  Hip internal rotation 3/5 4/5 4/5 4+/5  Hip external rotation 3/5 3/5 4/5 3/5p!   (Blank rows = not tested)  LOWER EXTREMITY SPECIAL TESTS:  Ober's (11/14/2021): (+) on Rt  FUNCTIONAL TESTS:  5xSTS (11/14/2021): 15 seconds     SHOULDER SPECIAL TESTS:            Impingement tests: Hepler/Kennedy impingement test: positive  Rotator cuff assessment: Drop arm test: negative, Empty can test: positive , Full can test: negative, and see diagnostic section     PALPATION:  L supraspinatus groove can be easily palpated compared to contralateral side supine; scapulae equal             TODAY'S TREATMENT:   [facility] Adult PT Treatment:                                                DATE: 01/06/2022 Therapeutic Exercise: 6 minutes Nustep warm-up x5 minutes while collecting subjective information Supine  long-arm reverse 3x6 Bridge with GTB hip clamshell 3x10 Sidelying hip abduction with GTB 2x10 BIL Sidelying hip IR with GTB 2x10 BIL Manual Therapy: N/A Neuromuscular re-ed: N/A Therapeutic Activity: N/A Modalities: N/A Self Care: N/A   [facility] Adult PT Treatment:                                                DATE: 12/30/2021 Therapeutic Exercise: 6 minutes Nustep warm-up x5 minutes while collecting subjective information Bridge isometric with marching 3x20 Supine diaphragmatic breathing with abdominal brace 2x20 breaths Supine SLR with hip abduction with GTB around thighs 2x10 BIL Prone hip extension with GTB around thighs 2x10 BIL Squat to mat table 2x10 Manual Therapy: N/A Neuromuscular re-ed: N/A Therapeutic Activity: N/A Modalities: N/A Self Care: N/A  [facility] Adult PT Treatment:                                                DATE: 12/23/2021 Therapeutic Exercise: Bridge isometrics 6x10 second holds Seated marching with GTB around forefeet 3x10 BIL Seated BIL hip IR with GTB around ankles 2x10 Seated clamshells with GTB above knees 2x10 Standing hip extension with GTB around ankles 2x10 BIL Manual Therapy: N/A Neuromuscular re-ed: N/A Therapeutic Activity: Pt education regarding POC, prognosis Re-assessment of objective measures Modalities: N/A Self Care: N/A         PATIENT EDUCATION: Education details: reviewed PT POC, explained what tendonitis is Person educated: Patient Education method: Explanation Education comprehension: verbalized understanding     HOME EXERCISE PROGRAM:  Access Code: [email or web address] URL: [email or web address] Date: 09/24/2021 Prepared by: Edythe Lynn  Exercises - Supine Shoulder Flexion AAROM with Dowel  - 1 x daily - 7 x weekly - 2 sets - 10 reps - Supine Shoulder External Rotation in 45 Degrees Abduction AAROM with Dowel  - 1 x daily - 7 x weekly - 2 sets - 10 reps - Seated Scapular Retraction  - 1 x  daily - 7 x weekly - 2 sets - 10 reps - Circular Shoulder Pendulum with Table Support  - 1 x daily - 7 x weekly - 2 sets - 10 reps  Added 11/14/2021: - Squat with Chair Touch  - 1 x daily - 7 x weekly - 3 sets - 10 reps - Standing ITB Stretch  - 1 x daily - 7 x weekly - 2-min hold - Sidelying Hip Abduction  - 1 x daily - 7 x weekly - 3 sets - 10 reps     ASSESSMENT:   CLINICAL IMPRESSION: Pt responded well to further core/ LE strengthening today. Due to pt's primary c/o being knee and LBP, primarily towards the end of the day, treatment focused on these body parts. She will continue to benefit from skilled PT to address her primary impairments and return to her prior level of function with less limitation.     OBJECTIVE IMPAIRMENTS decreased activity tolerance, decreased mobility, decreased ROM, decreased strength, impaired UE functional use, postural dysfunction, and pain.    ACTIVITY LIMITATIONS cleaning, meal prep, shopping, and using L UE for fxnal activities .    PERSONAL FACTORS 3+ comorbidities: L breast cancer history, osteopenia, asthma, rotator cuff complex impairments  are also affecting patient's functional outcome.          GOALS: Goals reviewed with patient? Yes   SHORT TERM GOALS: Target date: 10/17/2021   Pt will be indep with initial HEP to assist with functional L UE strengthening Baseline: not initiated at eval due to time constraints Goal status: ACHIEVED 10/13/2021: Pt reports daily adherence to her HEP   2.  Pt will be able to hold the cell phone with L UE without resting it x 15 min Baseline: 10 min Goal status: ACHIEVED 10/13/2021: ACHIEVED           LONG TERM GOALS: Target date: 10/31/21   Pt will sleep in R sidelying at night >/=6 hours without waking up with L UE pain Baseline: 4-5 hours Goal status: ACHIEVED 10/13/2021: Pt reports no sleep disturbances due to pain the past week   2.  Pt will put deodorant under L arm with </= 4/10  pain  Baseline: 4/10-8/10 11/14/2021: Achieved Goal status: ACHIEVED    3.  Pt will be able to roll hair with 75% less difficulty using L UE Baseline: 85% difficulty 11/14/2021: 20% difficulty Goal status: ACHIEVED    4.  Pt will demonstrate improved L UE AROM flex/abdct/ER by 10 degrees to assist with functional mobility Baseline: flexion 81 deg, abdct 40 deg, ER to L ear measured seated Goal status: ACHIEVED 10/13/2021: See updated MMT chart   5. Pt will demo improved L UE strength to assist with functional mobility by >/= 1/2 MMT grade            Baseline:elbow flex/abdct 2-/5, elbow flex 3+/5            Goal status: ACHIEVED 10/13/2021: See updated MMT chart  6. *New 11/14/2021*   Pt will achieve BIL global hip strength of 4+/5 in order to progress her independent LE strengthening program with less limitation.  Baseline: See MMT chart 12/23/2021: See updated MMT chart  Goal Status: IN PROGRESS  7. *New 11/14/2021*  Pt will achieve a 5xSTS of 12 seconds or less in order to promote safe transfers.  Baseline: 15 seconds:  Goal Status: INITIAL  8. *New 11/14/2021*  Pt will achieve full, pain-free Rt hip abduction AROM in order to get into her car with less limitation.  Baseline: 50d with 6/10 pain  Goal Status: INITIAL       PLAN: PT FREQUENCY: 1x/week   PT DURATION: 8 weeks   PLANNED INTERVENTIONS: Therapeutic exercises, Therapeutic activity, Patient/Family education, Joint mobilization, Cryotherapy, Moist heat, Taping, Aquatic Therapy, Dry Needling, Ionotophoresis 67m/ml Dexamethasone, and Manual therapy. Hold on IONTO until signed off by MD for approval due to breast CA history.    PLAN FOR NEXT SESSION: f/u on doctor's appointment for lumbar compression fx, progress low-level hip strengthening   YVanessa [place] PT, DPT 01/06/22 1:49 PM

## 2022-01-10 ENCOUNTER — Ambulatory Visit
Admission: RE | Admit: 2022-01-10 | Discharge: 2022-01-10 | Disposition: A | Discharge status: Home / Self Care | Payer: Medicare HMO | Source: Ambulatory Visit | Attending: Orthopaedic Surgery | Admitting: Orthopaedic Surgery

## 2022-01-10 DIAGNOSIS — M25562 Pain in left knee: Secondary | ICD-10-CM

## 2022-01-11 ENCOUNTER — Ambulatory Visit
Admission: RE | Admit: 2022-01-11 | Discharge: 2022-01-11 | Disposition: A | Discharge status: Home / Self Care | Payer: Medicare HMO | Source: Ambulatory Visit | Attending: Physician Assistant | Admitting: Physician Assistant

## 2022-01-11 DIAGNOSIS — Z1231 Encounter for screening mammogram for malignant neoplasm of breast: Secondary | ICD-10-CM

## 2022-01-14 ENCOUNTER — Ambulatory Visit: Payer: Medicare HMO

## 2022-01-14 DIAGNOSIS — G8929 Other chronic pain: Secondary | ICD-10-CM

## 2022-01-14 DIAGNOSIS — M6281 Muscle weakness (generalized): Secondary | ICD-10-CM

## 2022-01-14 DIAGNOSIS — M25512 Pain in left shoulder: Secondary | ICD-10-CM | POA: Diagnosis not present

## 2022-01-14 DIAGNOSIS — M25551 Pain in right hip: Secondary | ICD-10-CM

## 2022-01-14 DIAGNOSIS — R262 Difficulty in walking, not elsewhere classified: Secondary | ICD-10-CM

## 2022-01-14 NOTE — Therapy (Signed)
OUTPATIENT PHYSICAL THERAPY TREATMENT NOTE    Patient Name: [name] MRN: [id number] DOB:August 29, 1940, 81 y.o., female Today's Date: 01/14/2022  PCP: [name] REFERRING PROVIDER: [name]   PT End of Session - 01/14/22 1014     Visit Number 17    Number of Visits 18    Date for PT Re-Evaluation 01/16/22    Authorization Type [employer] MCR    Authorization Time Period FOTO V6, V10, kx mod v15    Progress Note Due on Visit 20    PT Start Time 1011   Pt arrived 10 minutes late to her appointment.   PT Stop Time 1041    PT Time Calculation (min) 30 min    Activity Tolerance Patient tolerated treatment well    Behavior During Therapy WFL for tasks assessed/performed                            Past Medical History:  Diagnosis Date   Anemia    PMH;  Patient denies this dx as of 06/03/21   Anxiety    Asthma    Breast cancer ([place]) 1988   left breast cancer   Collagenous colitis    Compression fracture    lumbar 1   Dyspnea    with exertion   Hypertension    Hypothyroidism    Osteoarthritis    Osteopenia    Pneumonia 07/2020   PONV (postoperative nausea and vomiting)    Sleep apnea    wears CPAP nightly   Past Surgical History:  Procedure Laterality Date   BREAST REDUCTION SURGERY     Right   CATARACT EXTRACTION W/ INTRAOCULAR LENS  IMPLANT, BILATERAL     CESAREAN SECTION     COLONOSCOPY W/ BIOPSIES AND POLYPECTOMY     CYST EXCISION N/A 08/30/2018   Procedure: excision of posterior shoulder / back sebaceous cyst;  Surgeon: Wallace Going, DO;  Location: [facility];  Service: Plastics;  Laterality: N/A;   KYPHOPLASTY N/A 02/18/2016   Procedure: LUMBAR 1 KYPHOPLASTY;  Surgeon: Phylliss Bob, MD;  Location: [facility];  Service: Orthopedics;  Laterality: N/A;  LUMBAR 1 KYPHOPLASTY   KYPHOPLASTY N/A 06/04/2021   Procedure: LUMBAR 2 KYPHOPLASTY;  Surgeon: Phylliss Bob, MD;  Location: [facility];  Service:  Orthopedics;  Laterality: N/A;   LAPAROSCOPIC APPENDECTOMY N/A 12/09/2021   Procedure: APPENDECTOMY LAPAROSCOPIC;  Surgeon: Ileana Roup, MD;  Location: [facility];  Service: General;  Laterality: N/A;   MODIFIED RADICAL MASTECTOMY W/ AXILLARY LYMPH NODE DISSECTION     Left   RECONSTRUCTION BREAST W/ LATISSIMUS DORSI FLAP     left   REDUCTION MAMMAPLASTY Right    right foot surgery     corrected hammer toe, straightened big toe   SEPTOPLASTY     TOE SURGERY     TONSILLECTOMY AND ADENOIDECTOMY     Patient Active Problem List   Diagnosis Date Noted   Acute appendicitis 12/09/2021   S/P laparoscopic appendectomy 12/09/2021   CAP (community acquired pneumonia) 09/30/2020   Chronic respiratory failure with hypoxia ([place]) 09/23/2020   Allergic rhinitis 08/31/2019   Pilar cyst 08/04/2018   History of breast cancer in female 06/09/2018   History of reconstruction of both breasts 06/09/2018   Severe obesity (BMI >= 40) ([facility]) 09/16/2015   Asthma with acute exacerbation 09/15/2015   DOE (dyspnea on exertion) 01/11/2014   Insomnia 05/05/2012   Osteoarthritis  Osteopenia    Collagenous colitis    OSA (obstructive sleep apnea)    Hypothyroidism    Mild persistent asthma    Hypertension    Breast cancer, left breast ([facility])     REFERRING DIAG: L chronic RCT  THERAPY DIAG:  Chronic left shoulder pain  Muscle weakness (generalized)  Pain in right hip  Difficulty in walking, not elsewhere classified  PERTINENT HISTORY: About 6 years ago, pt was sitting on a footstool and went to pick up a toy that had fallen to the floor and grandson began to slide out of a chair and she threw her arm up to prevent him from falling.  She heard a pop. She had therapy before that assisted.In the past couple of months, pain began again insidiously. Pt has a history of hip pain of which she has a 66 month old prescription for as well; wants to concentrate on L shoulder first. She reports having a transport  w/c to assist with cooking.   PRECAUTIONS: None  SUBJECTIVE: Pt reports that after her last visit, her Lt knee and Lt shoulder were in pain for about three days. She also had her Lt knee MRI on Sunday, and these results are detailed below. She is agreeable to finishing out the POC for her hip and being discharged on Saturday to follow-up with Dr. Rhona Raider in regard to her knee.    PAIN:  Are you having pain? Yes: NPRS scale: 3/10 Pain location: Lt knee Pain description: [place]    OBJECTIVE:   *Unless otherwise noted, objective information collected previously*  DIAGNOSTIC FINDINGS:  MRI 09/06/21 IMPRESSION: 1. Moderate tendinosis of the supraspinatus tendon with a 10 mm full-thickness tear and 11 mm of retraction. 2. Moderate tendinosis of the infraspinatus tendon. 3. Moderate tendinosis of the subscapularis tendon. 4. Mild tendinosis of the intra-articular portion of the long head of the biceps tendon. 5. Severe osteoarthritis of the left glenohumeral joint.  01/10/2022: MR Lt knee without contrast:  IMPRESSION: 1. Advanced tricompartmental osteoarthritis, most severe within the lateral compartment. 2. Advanced degeneration and tearing throughout the lateral meniscus. 3. Small joint effusion with multiple intra-articular loose bodies.     PATIENT SURVEYS:  FOTO : 81, predicted 63 10/13/2021: 52% 11/14/2021: 66% (goal met)   COGNITION:           Overall cognitive status: Within functional limits for tasks assessed                                  SENSATION: WFL   POSTURE: Dowers hump, rounded shoulders bil, forward head.    UPPER EXTREMITY ROM:    Active ROM-measured sitting Right 09/19/2021 Left 09/19/2021 Left (supine) 10/13/2021  Shoulder flexion WFL 81 degrees 150 degrees with p!  Shoulder extension       Shoulder abduction WFL 40 deg with shoulder hike 160 degrees  Shoulder adduction       Shoulder internal rotation L4/5 L glute med  20 degrees  Shoulder  external rotation T2 To L ear 60 degrees  Elbow flexion       Elbow extension       Wrist flexion       Wrist extension       Wrist ulnar deviation       Wrist radial deviation       Wrist pronation       Wrist supination       (  Blank rows = not tested)   PROM: L shoulder supine: flexion to 120, abduction to 140, IR to approx 40, ER to approx 60   UPPER EXTREMITY MMT:   MMT Right 09/19/2021 Left 09/19/2021 Right 10/13/2021 Left 10/13/2021 Right 11/14/2021 Left 11/14/2021  Shoulder flexion 3+/5 2-/5 3+/5 3/5 4+/5 4+/5  Shoulder extension          Shoulder abduction 3-/5 2-/5 4+/5 4+/5 5/5 5/5  Shoulder adduction          Shoulder internal rotation 3+/5 2-/5 5/5 5/5 5/5 5/5  Shoulder external rotation 3+/5 2-/5 5/5 4+/5 5/5 5/5  Middle trapezius          Lower trapezius          Elbow flexion 4+/5 3+/5 5/5 5/5    Elbow extension 4+/5 3/5 5/5 5/5    Wrist flexion          Wrist extension          Wrist ulnar deviation          Wrist radial deviation          Wrist pronation          Wrist supination          Grip strength (lbs)          (Blank rows = not tested)   Cervical AROM limited 25% all planes      PALPATION: 11/14/2021: Exquisite TTP to Rt greater trochanter  LE ROM:  A/PROM Right 11/14/2021 Left 11/14/2021  Hip flexion 90/120 95/120  Hip abduction 50p! 55/60  Hip adduction 15/20 15/20  Hip internal rotation    Hip external rotation     (Blank rows = not tested)  LE MMT:  MMT Right 11/14/2021 Left 11/14/2021 Right 12/23/2021 Left 12/23/2021  Hip flexion 3/5 3/5 4/5 4/5  Hip extension 3-/5 3-/5    Hip abduction 3-/5 3-/5 3/5 3/5  Hip internal rotation 3/5 4/5 4/5 4+/5  Hip external rotation 3/5 3/5 4/5 3/5p!   (Blank rows = not tested)  LOWER EXTREMITY SPECIAL TESTS:  Ober's (11/14/2021): (+) on Rt  FUNCTIONAL TESTS:  5xSTS (11/14/2021): 15 seconds     SHOULDER SPECIAL TESTS:            Impingement tests: Reddish/Kennedy impingement test:  positive  Rotator cuff assessment: Drop arm test: negative, Empty can test: positive , Full can test: negative, and see diagnostic section     PALPATION:  L supraspinatus groove can be easily palpated compared to contralateral side supine; scapulae equal             TODAY'S TREATMENT:  [facility] Adult PT Treatment:                                                DATE: 01/14/2022 Therapeutic Exercise: 6 minutes Nustep warm-up x5 minutes while collecting subjective information Seated Lt knee LAQ 2x20 Supine SLR with hip abduction with YTB 3x10 BIL Single-leg 90/90 hip flexion isometric with handhold resistance 3x30sec BIL Seated BIL hip IR with YTB 2x10 Manual Therapy: N/A Neuromuscular re-ed: N/A Therapeutic Activity: N/A Modalities: N/A Self Care: N/A  [facility] Adult PT Treatment:  DATE: 01/06/2022 Therapeutic Exercise: 6 minutes Nustep warm-up x5 minutes while collecting subjective information Supine long-arm reverse 3x6 Bridge with GTB hip clamshell 3x10 Sidelying hip abduction with GTB 2x10 BIL Sidelying hip IR with GTB 2x10 BIL Manual Therapy: N/A Neuromuscular re-ed: N/A Therapeutic Activity: N/A Modalities: N/A Self Care: N/A   [facility] Adult PT Treatment:                                                DATE: 12/30/2021 Therapeutic Exercise: 6 minutes Nustep warm-up x5 minutes while collecting subjective information Bridge isometric with marching 3x20 Supine diaphragmatic breathing with abdominal brace 2x20 breaths Supine SLR with hip abduction with GTB around thighs 2x10 BIL Prone hip extension with GTB around thighs 2x10 BIL Squat to mat table 2x10 Manual Therapy: N/A Neuromuscular re-ed: N/A Therapeutic Activity: N/A Modalities: N/A Self Care: N/A        PATIENT EDUCATION: Education details: reviewed PT POC, explained what tendonitis is Person educated: Patient Education method: Explanation Education  comprehension: verbalized understanding     HOME EXERCISE PROGRAM:  Access Code: [email or web address] URL: [email or web address] Date: 09/24/2021 Prepared by: Edythe Lynn  Exercises - Supine Shoulder Flexion AAROM with Dowel  - 1 x daily - 7 x weekly - 2 sets - 10 reps - Supine Shoulder External Rotation in 45 Degrees Abduction AAROM with Dowel  - 1 x daily - 7 x weekly - 2 sets - 10 reps - Seated Scapular Retraction  - 1 x daily - 7 x weekly - 2 sets - 10 reps - Circular Shoulder Pendulum with Table Support  - 1 x daily - 7 x weekly - 2 sets - 10 reps  Added 11/14/2021: - Squat with Chair Touch  - 1 x daily - 7 x weekly - 3 sets - 10 reps - Standing ITB Stretch  - 1 x daily - 7 x weekly - 2-min hold - Sidelying Hip Abduction  - 1 x daily - 7 x weekly - 3 sets - 10 reps     ASSESSMENT:   CLINICAL IMPRESSION: Due to pt arriving 10 minutes late to her appointment, the session was truncated today. Upon review of her recent Lt knee MRI showing tearing through her Lt lateral meniscus, free-floating bodies within her knee joint, and advanced tricompartmental OA, exercises were regressed to limit loading through the Lt knee. Plan at this time is to re-assess goals and likely discharge current POC at next visit and refer back to pt's referring provider to discuss treatment alternatives in regard to her knee impairments. Pt responded well to regressed exercises today and will continue to benefit from skilled PT to address her primary impairments and return to her prior level of function with less limitation.     OBJECTIVE IMPAIRMENTS decreased activity tolerance, decreased mobility, decreased ROM, decreased strength, impaired UE functional use, postural dysfunction, and pain.    ACTIVITY LIMITATIONS cleaning, meal prep, shopping, and using L UE for fxnal activities .    PERSONAL FACTORS 3+ comorbidities: L breast cancer history, osteopenia, asthma, rotator cuff complex impairments  are  also affecting patient's functional outcome.          GOALS: Goals reviewed with patient? Yes   SHORT TERM GOALS: Target date: 10/17/2021   Pt will be indep with initial HEP to assist with functional L UE strengthening Baseline: not initiated at  eval due to time constraints Goal status: ACHIEVED 10/13/2021: Pt reports daily adherence to her HEP   2.  Pt will be able to hold the cell phone with L UE without resting it x 15 min Baseline: 10 min Goal status: ACHIEVED 10/13/2021: ACHIEVED           LONG TERM GOALS: Target date: 10/31/21   Pt will sleep in R sidelying at night >/=6 hours without waking up with L UE pain Baseline: 4-5 hours Goal status: ACHIEVED 10/13/2021: Pt reports no sleep disturbances due to pain the past week   2.  Pt will put deodorant under L arm with </= 4/10 pain Baseline: 4/10-8/10 11/14/2021: Achieved Goal status: ACHIEVED    3.  Pt will be able to roll hair with 75% less difficulty using L UE Baseline: 85% difficulty 11/14/2021: 20% difficulty Goal status: ACHIEVED    4.  Pt will demonstrate improved L UE AROM flex/abdct/ER by 10 degrees to assist with functional mobility Baseline: flexion 81 deg, abdct 40 deg, ER to L ear measured seated Goal status: ACHIEVED 10/13/2021: See updated MMT chart   5. Pt will demo improved L UE strength to assist with functional mobility by >/= 1/2 MMT grade            Baseline:elbow flex/abdct 2-/5, elbow flex 3+/5            Goal status: ACHIEVED 10/13/2021: See updated MMT chart  6. *New 11/14/2021*   Pt will achieve BIL global hip strength of 4+/5 in order to progress her independent LE strengthening program with less limitation.  Baseline: See MMT chart 12/23/2021: See updated MMT chart  Goal Status: IN PROGRESS  7. *New 11/14/2021*  Pt will achieve a 5xSTS of 12 seconds or less in order to promote safe transfers.  Baseline: 15 seconds:  Goal Status: INITIAL  8. *New 11/14/2021*  Pt will achieve full,  pain-free Rt hip abduction AROM in order to get into her car with less limitation.  Baseline: 50d with 6/10 pain  Goal Status: INITIAL       PLAN: PT FREQUENCY: 1x/week   PT DURATION: 8 weeks   PLANNED INTERVENTIONS: Therapeutic exercises, Therapeutic activity, Patient/Family education, Joint mobilization, Cryotherapy, Moist heat, Taping, Aquatic Therapy, Dry Needling, Ionotophoresis 22m/ml Dexamethasone, and Manual therapy. Hold on IONTO until signed off by MD for approval due to breast CA history.    PLAN FOR NEXT SESSION: D/C   YVanessa [place] PT, DPT 01/14/22 10:46 AM

## 2022-01-16 ENCOUNTER — Ambulatory Visit: Payer: Medicare HMO

## 2022-01-16 DIAGNOSIS — M6281 Muscle weakness (generalized): Secondary | ICD-10-CM

## 2022-01-16 DIAGNOSIS — M25551 Pain in right hip: Secondary | ICD-10-CM

## 2022-01-16 DIAGNOSIS — R262 Difficulty in walking, not elsewhere classified: Secondary | ICD-10-CM

## 2022-01-16 DIAGNOSIS — M25512 Pain in left shoulder: Secondary | ICD-10-CM | POA: Diagnosis not present

## 2022-01-16 DIAGNOSIS — G8929 Other chronic pain: Secondary | ICD-10-CM

## 2022-01-16 NOTE — Therapy (Signed)
OUTPATIENT PHYSICAL THERAPY TREATMENT NOTE/ DISCHARGE SUMMARY    Patient Name: [name] MRN: [id number] DOB:1941-03-05, 81 y.o., female Today's Date: 01/16/2022  PCP: [name] REFERRING PROVIDER: [name]   PT End of Session - 01/16/22 0856     Visit Number 18    Number of Visits 18    Date for PT Re-Evaluation 01/16/22    Authorization Type [employer] MCR    Authorization Time Period FOTO V6, V10, kx mod v15    Progress Note Due on Visit 20    PT Start Time 0900    PT Stop Time 0938    PT Time Calculation (min) 38 min    Activity Tolerance Patient tolerated treatment well    Behavior During Therapy [facility] for tasks assessed/performed                             Past Medical History:  Diagnosis Date   Anemia    PMH;  Patient denies this dx as of 06/03/21   Anxiety    Asthma    Breast cancer ([place]) 1988   left breast cancer   Collagenous colitis    Compression fracture    lumbar 1   Dyspnea    with exertion   Hypertension    Hypothyroidism    Osteoarthritis    Osteopenia    Pneumonia 07/2020   PONV (postoperative nausea and vomiting)    Sleep apnea    wears CPAP nightly   Past Surgical History:  Procedure Laterality Date   BREAST REDUCTION SURGERY     Right   CATARACT EXTRACTION W/ INTRAOCULAR LENS  IMPLANT, BILATERAL     CESAREAN SECTION     COLONOSCOPY W/ BIOPSIES AND POLYPECTOMY     CYST EXCISION N/A 08/30/2018   Procedure: excision of posterior shoulder / back sebaceous cyst;  Surgeon: Wallace Going, DO;  Location: [place];  Service: Plastics;  Laterality: N/A;   KYPHOPLASTY N/A 02/18/2016   Procedure: LUMBAR 1 KYPHOPLASTY;  Surgeon: Phylliss Bob, MD;  Location: [place];  Service: Orthopedics;  Laterality: N/A;  LUMBAR 1 KYPHOPLASTY   KYPHOPLASTY N/A 06/04/2021   Procedure: LUMBAR 2 KYPHOPLASTY;  Surgeon: Phylliss Bob, MD;  Location: [place];  Service: Orthopedics;  Laterality: N/A;    LAPAROSCOPIC APPENDECTOMY N/A 12/09/2021   Procedure: APPENDECTOMY LAPAROSCOPIC;  Surgeon: Ileana Roup, MD;  Location: [place];  Service: General;  Laterality: N/A;   MODIFIED RADICAL MASTECTOMY W/ AXILLARY LYMPH NODE DISSECTION     Left   RECONSTRUCTION BREAST W/ LATISSIMUS DORSI FLAP     left   REDUCTION MAMMAPLASTY Right    right foot surgery     corrected hammer toe, straightened big toe   SEPTOPLASTY     TOE SURGERY     TONSILLECTOMY AND ADENOIDECTOMY     Patient Active Problem List   Diagnosis Date Noted   Acute appendicitis 12/09/2021   S/P laparoscopic appendectomy 12/09/2021   CAP (community acquired pneumonia) 09/30/2020   Chronic respiratory failure with hypoxia ([place]) 09/23/2020   Allergic rhinitis 08/31/2019   Pilar cyst 08/04/2018   History of breast cancer in female 06/09/2018   History of reconstruction of both breasts 06/09/2018   Severe obesity (BMI >= 40) ([place]) 09/16/2015   Asthma with acute exacerbation 09/15/2015   DOE (dyspnea on exertion) 01/11/2014   Insomnia 05/05/2012   Osteoarthritis    Osteopenia  Collagenous colitis    OSA (obstructive sleep apnea)    Hypothyroidism    Mild persistent asthma    Hypertension    Breast cancer, left breast ([facility])     REFERRING DIAG: L chronic RCT  THERAPY DIAG:  Chronic left shoulder pain  Muscle weakness (generalized)  Pain in right hip  Difficulty in walking, not elsewhere classified  PERTINENT HISTORY: About 6 years ago, pt was sitting on a footstool and went to pick up a toy that had fallen to the floor and grandson began to slide out of a chair and she threw her arm up to prevent him from falling.  She heard a pop. She had therapy before that assisted.In the past couple of months, pain began again insidiously. Pt has a history of hip pain of which she has a 48 month old prescription for as well; wants to concentrate on L shoulder first. She reports having a transport w/c to assist with cooking.    PRECAUTIONS: None  SUBJECTIVE: Pt reports doing well in regard to her hip pain, reporting no pain in her hip the past few weeks. Her primary pain currently is related to her Lt knee, for which she is scheduled for a follow-up appointment next Friday with Dr. Rhona Raider. She reports readiness to discharge from PT at this time for her hip/ shoulder problems. She would like to determine the need for potential future PT or alternative treatments for her knee pain after her visit with Dr. Rhona Raider.   PAIN:  Are you having pain? Yes: NPRS scale: 3/10 Pain location: Lt knee Pain description: [place]    OBJECTIVE:   *Unless otherwise noted, objective information collected previously*  DIAGNOSTIC FINDINGS:  MRI 09/06/21 IMPRESSION: 1. Moderate tendinosis of the supraspinatus tendon with a 10 mm full-thickness tear and 11 mm of retraction. 2. Moderate tendinosis of the infraspinatus tendon. 3. Moderate tendinosis of the subscapularis tendon. 4. Mild tendinosis of the intra-articular portion of the long head of the biceps tendon. 5. Severe osteoarthritis of the left glenohumeral joint.  01/10/2022: MR Lt knee without contrast:  IMPRESSION: 1. Advanced tricompartmental osteoarthritis, most severe within the lateral compartment. 2. Advanced degeneration and tearing throughout the lateral meniscus. 3. Small joint effusion with multiple intra-articular loose bodies.     PATIENT SURVEYS:  FOTO : 40, predicted 63 10/13/2021: 52% 11/14/2021: 66% (goal met)   COGNITION:           Overall cognitive status: Within functional limits for tasks assessed                                  SENSATION: WFL   POSTURE: Dowers hump, rounded shoulders bil, forward head.    UPPER EXTREMITY ROM:    Active ROM-measured sitting Right 09/19/2021 Left 09/19/2021 Left (supine) 10/13/2021  Shoulder flexion WFL 81 degrees 150 degrees with p!  Shoulder extension       Shoulder abduction WFL 40 deg with shoulder  hike 160 degrees  Shoulder adduction       Shoulder internal rotation L4/5 L glute med  20 degrees  Shoulder external rotation T2 To L ear 60 degrees  Elbow flexion       Elbow extension       Wrist flexion       Wrist extension       Wrist ulnar deviation       Wrist radial deviation  Wrist pronation       Wrist supination       (Blank rows = not tested)   PROM: L shoulder supine: flexion to 120, abduction to 140, IR to approx 40, ER to approx 60   UPPER EXTREMITY MMT:   MMT Right 09/19/2021 Left 09/19/2021 Right 10/13/2021 Left 10/13/2021 Right 11/14/2021 Left 11/14/2021  Shoulder flexion 3+/5 2-/5 3+/5 3/5 4+/5 4+/5  Shoulder extension          Shoulder abduction 3-/5 2-/5 4+/5 4+/5 5/5 5/5  Shoulder adduction          Shoulder internal rotation 3+/5 2-/5 5/5 5/5 5/5 5/5  Shoulder external rotation 3+/5 2-/5 5/5 4+/5 5/5 5/5  Middle trapezius          Lower trapezius          Elbow flexion 4+/5 3+/5 5/5 5/5    Elbow extension 4+/5 3/5 5/5 5/5    Wrist flexion          Wrist extension          Wrist ulnar deviation          Wrist radial deviation          Wrist pronation          Wrist supination          Grip strength (lbs)          (Blank rows = not tested)   Cervical AROM limited 25% all planes      PALPATION: 11/14/2021: Exquisite TTP to Rt greater trochanter  LE ROM:  A/PROM Right 11/14/2021 Left 11/14/2021 Right 01/16/2022 Left 01/16/2022  Hip flexion 90/120 95/120 90/110 90/120  Hip abduction 50p! 55/60 55 no pain/ 60 55/ 60  Hip adduction _0 15/20  Hip internal rotation      Hip external rotation       (Blank rows = not tested)  LE MMT:  MMT Right 11/14/2021 Left 11/14/2021 Right 12/23/2021 Left 12/23/2021 Right 01/16/2022 Left 01/16/2022  Hip flexion 3/5 3/5 4/5 4/5 4+/5 4+/5  Hip extension 3-/5 3-/5   4/5 4/5  Hip abduction 3-/5 3-/5 3/5 3/5 4+/5 4+/5  Hip internal rotation 3/5 4/5 4/5 4+/5 5/5 5/5  Hip external rotation 3/5  3/5 4/5 3/5p! 5/5 4/5, limited by knee pain   (Blank rows = not tested)  LOWER EXTREMITY SPECIAL TESTS:  Ober's (11/14/2021): (+) on Rt  FUNCTIONAL TESTS:  5xSTS (11/14/2021): 15 seconds  01/16/2022: 13.5 seconds     SHOULDER SPECIAL TESTS:            Impingement tests: Trompeter/Kennedy impingement test: positive  Rotator cuff assessment: Drop arm test: negative, Empty can test: positive , Full can test: negative, and see diagnostic section     PALPATION:  L supraspinatus groove can be easily palpated compared to contralateral side supine; scapulae equal             TODAY'S TREATMENT:   [facility] Adult PT Treatment:                                                DATE: 01/16/2022 Therapeutic Exercise: Seated BIL shoulder ER with RTB 3x10 Standing hip extension with YTB 2x10 BIL Mini squat with UE support 3x8 Manual Therapy: N/A Neuromuscular re-ed: N/A Therapeutic Activity: Re-assessment of objective measures with pt education Update  of HEP with pt education on POC moving forward Modalities: N/A Self Care: N/A   [facility] Adult PT Treatment:                                                DATE: 01/14/2022 Therapeutic Exercise: 6 minutes Nustep warm-up x5 minutes while collecting subjective information Seated Lt knee LAQ 2x20 Supine SLR with hip abduction with YTB 3x10 BIL Single-leg 90/90 hip flexion isometric with handhold resistance 3x30sec BIL Seated BIL hip IR with YTB 2x10 Manual Therapy: N/A Neuromuscular re-ed: N/A Therapeutic Activity: N/A Modalities: N/A Self Care: N/A  [facility] Adult PT Treatment:                                                DATE: 01/06/2022 Therapeutic Exercise: 6 minutes Nustep warm-up x5 minutes while collecting subjective information Supine long-arm reverse 3x6 Bridge with GTB hip clamshell 3x10 Sidelying hip abduction with GTB 2x10 BIL Sidelying hip IR with GTB 2x10 BIL Manual Therapy: N/A Neuromuscular re-ed: N/A Therapeutic  Activity: N/A Modalities: N/A Self Care: N/A       PATIENT EDUCATION: Education details: reviewed PT POC, explained what tendonitis is Person educated: Patient Education method: Explanation Education comprehension: verbalized understanding     HOME EXERCISE PROGRAM:  Access Code: [email or web address] URL: [email or web address] Date: 09/24/2021 Prepared by: Edythe Lynn  Exercises - Supine Shoulder Flexion AAROM with Dowel  - 1 x daily - 7 x weekly - 2 sets - 10 reps - Supine Shoulder External Rotation in 45 Degrees Abduction AAROM with Dowel  - 1 x daily - 7 x weekly - 2 sets - 10 reps - Seated Scapular Retraction  - 1 x daily - 7 x weekly - 2 sets - 10 reps - Circular Shoulder Pendulum with Table Support  - 1 x daily - 7 x weekly - 2 sets - 10 reps  Added 11/14/2021: - Squat with Chair Touch  - 1 x daily - 7 x weekly - 3 sets - 10 reps - Standing ITB Stretch  - 1 x daily - 7 x weekly - 2-min hold - Sidelying Hip Abduction  - 1 x daily - 7 x weekly - 3 sets - 10 reps  Added 01/16/2022: - Shoulder External Rotation and Scapular Retraction with Resistance  - 1 x daily - 7 x weekly - 3 sets - 10 reps - Hip Extension with Resistance Loop  - 1 x daily - 7 x weekly - 3 sets - 10 reps     ASSESSMENT:   CLINICAL IMPRESSION: Due to pt arriving 10 minutes late to her appointment, the session was truncated today. Upon review of her recent Lt knee MRI showing tearing through her Lt lateral meniscus, free-floating bodies within her knee joint, and advanced tricompartmental OA, exercises were regressed to limit loading through the Lt knee. Plan at this time is to re-assess goals and likely discharge current POC at next visit and refer back to pt's referring provider to discuss treatment alternatives in regard to her knee impairments. Pt responded well to regressed exercises today and will continue to benefit from skilled PT to address her primary impairments and return to her prior  level of function with less limitation.  OBJECTIVE IMPAIRMENTS decreased activity tolerance, decreased mobility, decreased ROM, decreased strength, impaired UE functional use, postural dysfunction, and pain.    ACTIVITY LIMITATIONS cleaning, meal prep, shopping, and using L UE for fxnal activities .    PERSONAL FACTORS 3+ comorbidities: L breast cancer history, osteopenia, asthma, rotator cuff complex impairments  are also affecting patient's functional outcome.          GOALS: Goals reviewed with patient? Yes   SHORT TERM GOALS: Target date: 10/17/2021   Pt will be indep with initial HEP to assist with functional L UE strengthening Baseline: not initiated at eval due to time constraints Goal status: ACHIEVED 10/13/2021: Pt reports daily adherence to her HEP   2.  Pt will be able to hold the cell phone with L UE without resting it x 15 min Baseline: 10 min Goal status: ACHIEVED 10/13/2021: ACHIEVED           LONG TERM GOALS: Target date: 10/31/21   Pt will sleep in R sidelying at night >/=6 hours without waking up with L UE pain Baseline: 4-5 hours Goal status: ACHIEVED 10/13/2021: Pt reports no sleep disturbances due to pain the past week   2.  Pt will put deodorant under L arm with </= 4/10 pain Baseline: 4/10-8/10 11/14/2021: Achieved Goal status: ACHIEVED    3.  Pt will be able to roll hair with 75% less difficulty using L UE Baseline: 85% difficulty 11/14/2021: 20% difficulty Goal status: ACHIEVED    4.  Pt will demonstrate improved L UE AROM flex/abdct/ER by 10 degrees to assist with functional mobility Baseline: flexion 81 deg, abdct 40 deg, ER to L ear measured seated Goal status: ACHIEVED 10/13/2021: See updated MMT chart   5. Pt will demo improved L UE strength to assist with functional mobility by >/= 1/2 MMT grade            Baseline:elbow flex/abdct 2-/5, elbow flex 3+/5            Goal status: ACHIEVED 10/13/2021: See updated MMT chart  6. *New  11/14/2021*   Pt will achieve BIL global hip strength of 4+/5 in order to progress her independent LE strengthening program with less limitation.  Baseline: See MMT chart 12/23/2021: See updated MMT chart 01/16/2022: 4-5/5 MMT in every plane  Goal Status: ACHIEVED  7. *New 11/14/2021*  Pt will achieve a 5xSTS of 12 seconds or less in order to promote safe transfers.  Baseline: 15 seconds 01/16/2022: 13.5 seconds  Goal Status: PARTIALLY MET  8. *New 11/14/2021*  Pt will achieve full, pain-free Rt hip abduction AROM in order to get into her car with less limitation.  Baseline: 50d with 6/10 pain 01/16/2022: 55d with 0/10 pain  Goal Status: ACHIEVED       PLAN: PT FREQUENCY: 1x/week   PT DURATION: 8 weeks   PLANNED INTERVENTIONS: Therapeutic exercises, Therapeutic activity, Patient/Family education, Joint mobilization, Cryotherapy, Moist heat, Taping, Aquatic Therapy, Dry Needling, Ionotophoresis 30m/ml Dexamethasone, and Manual therapy. Hold on IONTO until signed off by MD for approval due to breast CA history.    PLAN FOR NEXT SESSION: Pt is discharged from PT at this time.  PHYSICAL THERAPY DISCHARGE SUMMARY  Visits from Start of Care: 18  Current functional level related to goals / functional outcomes: Pt has met or mostly met all of her rehab goals for hip and shoulder impairments.   Remaining deficits: Pt continues to be limited by Lt knee pain   Education / Equipment: HEP  Patient agrees to discharge. Patient goals were met. Patient is being discharged due to meeting the stated rehab goals.   Vanessa Bergman, PT, DPT 01/16/22 9:42 AM

## 2022-02-03 ENCOUNTER — Encounter: Payer: Self-pay | Admitting: Cardiology

## 2022-02-04 ENCOUNTER — Other Ambulatory Visit: Payer: Self-pay | Admitting: Orthopaedic Surgery

## 2022-02-09 ENCOUNTER — Telehealth: Payer: Self-pay | Admitting: Pulmonary Disease

## 2022-02-09 NOTE — Patient Instructions (Signed)
DUE TO COVID-19 ONLY TWO VISITORS  (aged 79 and older)  ARE ALLOWED TO COME WITH YOU AND STAY IN THE WAITING ROOM ONLY DURING PRE OP AND PROCEDURE.   **NO VISITORS ARE ALLOWED IN THE SHORT STAY AREA OR RECOVERY ROOM!!**  IF YOU WILL BE ADMITTED INTO THE HOSPITAL YOU ARE ALLOWED ONLY FOUR SUPPORT PEOPLE DURING VISITATION HOURS ONLY (7 AM -8PM)   The support person(s) must pass our screening, gel in and out, and wear a mask at all times, including in the patient's room. Patients must also wear a mask when staff or their support person are in the room. Visitors GUEST BADGE MUST BE WORN VISIBLY  One adult visitor may remain with you overnight and MUST be in the room by 8 P.M.     Your procedure is scheduled on: 02/23/22   Report to [facility] Main Entrance    Report to admitting at : 8:00 AM   Call this number if you have problems the morning of surgery [phone]   Do not eat food :After Midnight.   After Midnight you may have the following liquids until: 7:00 AM DAY OF SURGERY  Water Black Coffee (sugar ok, NO MILK/CREAM OR CREAMERS)  Tea (sugar ok, NO MILK/CREAM OR CREAMERS) regular and decaf                             Plain Jell-O (NO RED)                                           Fruit ices (not with fruit pulp, NO RED)                                     Popsicles (NO RED)                                                                  Juice: apple, WHITE grape, WHITE cranberry Sports drinks like Gatorade (NO RED)              Drink  Ensure drink AT :  7:00 AM the day of surgery.     The day of surgery:  Drink ONE (1) Pre-Surgery Clear Ensure or G2 at AM the morning of surgery. Drink in one sitting. Do not sip.  This drink was given to you during your hospital  pre-op appointment visit. Nothing else to drink after completing the  Pre-Surgery Clear Ensure or G2.          If you have questions, please contact your surgeon's office.    Oral Hygiene is also  important to reduce your risk of infection.                                    Remember - BRUSH YOUR TEETH THE MORNING OF SURGERY WITH YOUR REGULAR TOOTHPASTE   Do NOT smoke after Midnight   Take these medicines the morning of surgery with  A SIP OF WATER: sertraline,hydralazine,diltiazem,synthroid.Use inhalers as usual.  DO NOT TAKE ANY ORAL DIABETIC MEDICATIONS DAY OF YOUR SURGERY  Bring CPAP mask and tubing day of surgery.                              You may not have any metal on your body including hair pins, jewelry, and body piercing             Do not wear make-up, lotions, powders, perfumes/cologne, or deodorant  Do not wear nail polish including gel and S&S, artificial/acrylic nails, or any other type of covering on natural nails including finger and toenails. If you have artificial nails, gel coating, etc. that needs to be removed by a nail salon please have this removed prior to surgery or surgery may need to be canceled/ delayed if the surgeon/ anesthesia feels like they are unable to be safely monitored.   Do not shave  48 hours prior to surgery.    Do not bring valuables to the hospital. [place].   Contacts, dentures or bridgework may not be worn into surgery.   Bring small overnight bag day of surgery.   DO NOT [place]. PHARMACY WILL DISPENSE MEDICATIONS LISTED ON YOUR MEDICATION LIST TO YOU DURING YOUR ADMISSION [place]!    Patients discharged on the day of surgery will not be allowed to drive home.  Someone NEEDS to stay with you for the first 24 hours after anesthesia.   Special Instructions: Bring a copy of your healthcare power of attorney and living will documents         the day of surgery if you haven't scanned them before.              Please read over the following fact sheets you were given: IF YOU HAVE QUESTIONS ABOUT YOUR PRE-OP INSTRUCTIONS PLEASE CALL  [phone]     [facility] - Preparing for Surgery Before surgery, you can play an important role.  Because skin is not sterile, your skin needs to be as free of germs as possible.  You can reduce the number of germs on your skin by washing with CHG (chlorahexidine gluconate) soap before surgery.  CHG is an antiseptic cleaner which kills germs and bonds with the skin to continue killing germs even after washing. Please DO NOT use if you have an allergy to CHG or antibacterial soaps.  If your skin becomes reddened/irritated stop using the CHG and inform your nurse when you arrive at Short Stay. Do not shave (including legs and underarms) for at least 48 hours prior to the first CHG shower.  You may shave your face/neck. Please follow these instructions carefully:  1.  Shower with CHG Soap the night before surgery and the  morning of Surgery.  2.  If you choose to wash your hair, wash your hair first as usual with your  normal  shampoo.  3.  After you shampoo, rinse your hair and body thoroughly to remove the  shampoo.                           4.  Use CHG as you would any other liquid soap.  You can apply chg directly  to the skin and  wash                       Gently with a scrungie or clean washcloth.  5.  Apply the CHG Soap to your body ONLY FROM THE NECK DOWN.   Do not use on face/ open                           Wound or open sores. Avoid contact with eyes, ears mouth and genitals (private parts).                       Wash face,  Genitals (private parts) with your normal soap.             6.  Wash thoroughly, paying special attention to the area where your surgery  will be performed.  7.  Thoroughly rinse your body with warm water from the neck down.  8.  DO NOT shower/wash with your normal soap after using and rinsing off  the CHG Soap.                9.  Pat yourself dry with a clean towel.            10.  Wear clean pajamas.            11.  Place clean sheets on your bed the night of your  first shower and do not  sleep with pets. Day of Surgery : Do not apply any lotions/deodorants the morning of surgery.  Please wear clean clothes to the hospital/surgery center.  FAILURE TO FOLLOW THESE INSTRUCTIONS MAY RESULT IN THE CANCELLATION OF YOUR SURGERY PATIENT SIGNATURE_________________________________  NURSE SIGNATURE__________________________________  ________________________________________________________________________   Adam Phenix  An incentive spirometer is a tool that can help keep your lungs clear and active. This tool measures how well you are filling your lungs with each breath. Taking long deep breaths may help reverse or decrease the chance of developing breathing (pulmonary) problems (especially infection) following: A long period of time when you are unable to move or be active. BEFORE THE PROCEDURE  If the spirometer includes an indicator to show your best effort, your nurse or respiratory therapist will set it to a desired goal. If possible, sit up straight or lean slightly forward. Try not to slouch. Hold the incentive spirometer in an upright position. INSTRUCTIONS FOR USE  Sit on the edge of your bed if possible, or sit up as far as you can in bed or on a chair. Hold the incentive spirometer in an upright position. Breathe out normally. Place the mouthpiece in your mouth and seal your lips tightly around it. Breathe in slowly and as deeply as possible, raising the piston or the ball toward the top of the column. Hold your breath for 3-5 seconds or for as long as possible. Allow the piston or ball to fall to the bottom of the column. Remove the mouthpiece from your mouth and breathe out normally. Rest for a few seconds and repeat Steps 1 through 7 at least 10 times every 1-2 hours when you are awake. Take your time and take a few normal breaths between deep breaths. The spirometer may include an indicator to show your best effort. Use the indicator  as a goal to work toward during each repetition. After each set of 10 deep breaths, practice coughing to be sure your lungs are clear. If you have  an incision (the cut made at the time of surgery), support your incision when coughing by placing a pillow or rolled up towels firmly against it. Once you are able to get out of bed, walk around indoors and cough well. You may stop using the incentive spirometer when instructed by your caregiver.  RISKS AND COMPLICATIONS Take your time so you do not get dizzy or light-headed. If you are in pain, you may need to take or ask for pain medication before doing incentive spirometry. It is harder to take a deep breath if you are having pain. AFTER USE Rest and breathe slowly and easily. It can be helpful to keep track of a log of your progress. Your caregiver can provide you with a simple table to help with this. If you are using the spirometer at home, follow these instructions: [place] IF:  You are having difficultly using the spirometer. You have trouble using the spirometer as often as instructed. Your pain medication is not giving enough relief while using the spirometer. You develop fever of 100.5 F (38.1 C) or higher. SEEK IMMEDIATE MEDICAL CARE IF:  You cough up bloody sputum that had not been present before. You develop fever of 102 F (38.9 C) or greater. You develop worsening pain at or near the incision site. MAKE SURE YOU:  Understand these instructions. Will watch your condition. Will get help right away if you are not doing well or get worse. Document Released: 11/01/2006 Document Revised: 09/13/2011 Document Reviewed: 01/02/2007 [facility] Patient Information 2014 [place], [place].   ________________________________________________________________________

## 2022-02-10 ENCOUNTER — Encounter (HOSPITAL_COMMUNITY): Payer: Self-pay

## 2022-02-10 ENCOUNTER — Encounter (HOSPITAL_COMMUNITY)
Admission: RE | Admit: 2022-02-10 | Discharge: 2022-02-10 | Disposition: A | Discharge status: Home / Self Care | Payer: Medicare HMO | Source: Ambulatory Visit | Attending: Orthopaedic Surgery | Admitting: Orthopaedic Surgery

## 2022-02-10 ENCOUNTER — Other Ambulatory Visit: Payer: Self-pay

## 2022-02-10 DIAGNOSIS — Z01818 Encounter for other preprocedural examination: Secondary | ICD-10-CM | POA: Diagnosis present

## 2022-02-10 NOTE — Telephone Encounter (Signed)
Called and spoke with pt letting her know that we would need her to be seen for a surgical clearance visit and she verbalized understanding. Pt has been scheduled at next available opening which is on August 21. Pt's surgery is scheduled August 22.  Attempted to call Wells Guiles to let her know this info but unable to reach. Left her a detailed message of the info about pt scheduled for a surgical clearance appt and stated to her in the message to reach out to office with any issues.  Routing this to surgical clearance pool.

## 2022-02-10 NOTE — Progress Notes (Signed)
For Short Stay: [place] appointment date: Date of COVID positive in last 59 days:  Bowel Prep reminder:   For Anesthesia: PCP - Roe Coombs: PAC. : Clearance: 02/01/22 Cardiologist -   Chest x-ray -  EKG - 07/01/21 Stress Test -  ECHO - 06/29/21 Cardiac Cath -  Pacemaker/ICD device last checked: Pacemaker orders received: Device Rep notified:  Spinal Cord Stimulator:  Sleep Study - Yes  CPAP - Yes  Fasting Blood Sugar -  Checks Blood Sugar _____ times a day Date and result of last Hgb A1c-  Blood Thinner Instructions: Aspirin Instructions: Last Dose:  Activity level: Can go up a flight of stairs and activities of daily living without stopping and without chest pain and/or shortness of breath   Able to exercise without chest pain and/or shortness of breath   Unable to go up a flight of stairs without chest pain and/or shortness of breath     Anesthesia review: Hx: HTN,OSA(CPAP).  Patient denies shortness of breath, fever, cough and chest pain at PAT appointment   Patient verbalized understanding of instructions that were given to them at the PAT appointment. Patient was also instructed that they will need to review over the PAT instructions again at home before surgery.

## 2022-02-22 ENCOUNTER — Ambulatory Visit (INDEPENDENT_AMBULATORY_CARE_PROVIDER_SITE_OTHER): Payer: Medicare HMO | Admitting: Pulmonary Disease

## 2022-02-22 ENCOUNTER — Encounter: Payer: Self-pay | Admitting: Pulmonary Disease

## 2022-02-22 VITALS — BP 140/80 | HR 62 | Ht 61.0 in | Wt 240.6 lb

## 2022-02-22 DIAGNOSIS — G4733 Obstructive sleep apnea (adult) (pediatric): Secondary | ICD-10-CM | POA: Diagnosis not present

## 2022-02-22 DIAGNOSIS — Z01811 Encounter for preprocedural respiratory examination: Secondary | ICD-10-CM | POA: Diagnosis not present

## 2022-02-22 DIAGNOSIS — J4540 Moderate persistent asthma, uncomplicated: Secondary | ICD-10-CM | POA: Diagnosis not present

## 2022-02-22 NOTE — H&P (Signed)
[name] is an 81 y.o. female.   Chief Complaint: left knee pain  HPI: [name] continues with some terrible left knee pain.  [name] remains on a walker as [name] is afraid [name] is going to fall.  [name] has been through an MRI scan since last time [name] was in.    MRI:  I reviewed an MRI scan films and report of a study done at [place] on 01/10/22.  This shows some advanced degenerative change lateral and patellofemoral with a lateral meniscus tear and multiple loose bodies.  Past Medical History:  Diagnosis Date   Anemia    PMH;  Patient denies this dx as of 06/03/21   Anxiety    Asthma    Breast cancer ([place]) 1988   left breast cancer   Collagenous colitis    Compression fracture    lumbar 1   Dyspnea    with exertion   Hypertension    Hypothyroidism    Osteoarthritis    Osteopenia    Pneumonia 07/2020   PONV (postoperative nausea and vomiting)    Sleep apnea    wears CPAP nightly    Past Surgical History:  Procedure Laterality Date   BREAST REDUCTION SURGERY     Right   CATARACT EXTRACTION W/ INTRAOCULAR LENS  IMPLANT, BILATERAL     CESAREAN SECTION     COLONOSCOPY W/ BIOPSIES AND POLYPECTOMY     CYST EXCISION N/A 08/30/2018   Procedure: excision of posterior shoulder / back sebaceous cyst;  Surgeon: Wallace Going, DO;  Location: [place];  Service: Plastics;  Laterality: N/A;   KYPHOPLASTY N/A 02/18/2016   Procedure: LUMBAR 1 KYPHOPLASTY;  Surgeon: Phylliss Bob, MD;  Location: [place];  Service: Orthopedics;  Laterality: N/A;  LUMBAR 1 KYPHOPLASTY   KYPHOPLASTY N/A 06/04/2021   Procedure: LUMBAR 2 KYPHOPLASTY;  Surgeon: Phylliss Bob, MD;  Location: [place];  Service: Orthopedics;  Laterality: N/A;   LAPAROSCOPIC APPENDECTOMY N/A 12/09/2021   Procedure: APPENDECTOMY LAPAROSCOPIC;  Surgeon: Ileana Roup, MD;  Location: [facility] OR;  Service: General;  Laterality: N/A;   LUMBAR DISC SURGERY     MODIFIED RADICAL MASTECTOMY W/ AXILLARY LYMPH NODE  DISSECTION     Left   RECONSTRUCTION BREAST W/ LATISSIMUS DORSI FLAP     left   REDUCTION MAMMAPLASTY Right    right foot surgery     corrected hammer toe, straightened big toe   SEPTOPLASTY     TOE SURGERY     TONSILLECTOMY AND ADENOIDECTOMY      Family History  Problem Relation Age of Onset   Lung cancer Mother    Hypertension Mother    Hypertension Father    Ovarian cancer Sister    Hypertension Brother    Angina Brother 76   Breast cancer Maternal Aunt    Brain cancer Maternal Aunt    Kidney cancer Paternal Uncle    Prostate cancer Paternal Uncle    Breast cancer Other        cousin   Ovarian cancer Other        cousin   Kidney cancer Other        cousin   Social History:  reports that [name] has never smoked. [name] has never used smokeless tobacco. [name] reports current alcohol use of about 1.0 standard drink of alcohol per week. [name] reports that [name] does not use drugs.  Allergies:  Allergies  Allergen Reactions   Nebivolol Hcl Other (See Comments)  Hypotensive episode   Bactrim [Sulfamethoxazole-Trimethoprim] Other (See Comments)    UNSPECIFIED    Dairy Aid [Tilactase] Diarrhea and Other (See Comments)    Ice cream, sour cream   Lactose Intolerance (Gi) Diarrhea   Microplegia Msa-Msg [Cardioplegia [place] Formula] Other (See Comments)    Unknown reaction    Adhesive [Tape] Rash   Meloxicam Nausea Only   Other Nausea And Vomiting    general anesthesia   Sulfa Antibiotics Rash         No medications prior to admission.    No results found for this or any previous visit (from the past 48 hour(s)). No results found.  Review of Systems  Musculoskeletal:  Positive for arthralgias.       Left knee  All other systems reviewed and are negative.   There were no vitals taken for this visit. Physical Exam Constitutional:      Appearance: Normal appearance.  HENT:     Head: Normocephalic and atraumatic.     Nose: Nose normal.     Mouth/Throat:      Pharynx: Oropharynx is clear.  Eyes:     Extraocular Movements: Extraocular movements intact.  Cardiovascular:     Rate and Rhythm: Normal rate.  Pulmonary:     Effort: Pulmonary effort is normal.  Abdominal:     Palpations: Abdomen is soft.  Musculoskeletal:     Cervical back: Normal range of motion.     Comments: Left knee exam is about the same with trace effusion and medial and lateral joint line pain.  Motion remains about 0-120.  Calf is soft and nontender.    Skin:    General: Skin is warm and dry.  Neurological:     General: No focal deficit present.     Mental Status: [name] is alert and oriented to person, place, and time. Mental status is at baseline.  Psychiatric:        Mood and Affect: Mood normal.        Behavior: Behavior normal.        Thought Content: Thought content normal.        Judgment: Judgment normal.      Assessment/Plan Assessment:  Left knee DJD, torn meniscus, and loose bodies by MRI 2023 with Orthovisc 2022 injected 12/25/21  Plan: Ileana continues to struggle with left knee pain and locking sensations.  [name] is not really a candidate for knee replacement with [name] BMI >40.  We did help [name] on the opposite knee back in 2015 with an arthroscopy where [name] had loose bodies and degenerative change as well.  My hope is that we can help [name] with a simple knee arthroscopy on the left.  I reviewed risk of anesthesia, infection, DVT. [name] is in agreement and would like to proceed.  Larwance Sachs Brittnei Jagiello, PA-C 02/22/2022, 1:44 PM

## 2022-02-22 NOTE — Progress Notes (Signed)
Pulmonary, Critical Care, and Sleep Medicine  Chief Complaint  Patient presents with   Follow-up    Follow-up    Constitutional:  BP (!) 140/80 (BP Location: Right Arm)   Pulse 62   Ht '5\' 1"'$  (1.549 m)   Wt 240 lb 9.6 oz (109.1 kg)   SpO2 97%   BMI 45.46 kg/m   Past Medical History:  Anemia, Anxiety, Breast cancer 1988, Collagenous colitis, L1 compression fracture, HTN, Hypothyroidism, OA, Osteopenia, Diastolic CHF, CKD 3a, COVID 23 July 2020  Past Surgical History:  Her  has a past surgical history that includes Modified radical mastectomy w/ axillary lymph node dissection; Breast reduction surgery; Toe Surgery; Septoplasty; Cesarean section; Tonsillectomy and adenoidectomy; right foot surgery; Reconstruction breast w/ latissimus dorsi flap; Colonoscopy w/ biopsies and polypectomy; Cataract extraction w/ intraocular lens  implant, bilateral; Kyphoplasty (N/A, 02/18/2016); Cyst excision (N/A, 08/30/2018); Reduction mammaplasty (Right); Kyphoplasty (N/A, 06/04/2021); laparoscopic appendectomy (N/A, 12/09/2021); and Lumbar disc surgery.  Brief Summary:  [name] is a 81 y.o. female with obstructive sleep apnea and asthma.     Subjective:   She has persistent pain in her left knee that is impacting her ability to walk.  She is scheduled for knee replacement on 02/23/22.  She had appendicitis in June 2023.  She did not having any difficulty tolerating surgery for this.  She is not having cough, wheeze, sputum, or chest congestion.  Uses CPAP nightly without difficulty.  Physical Exam:   Appearance - well kempt, using a walker  ENMT - no sinus tenderness, no oral exudate, no LAN, Mallampati 3 airway, no stridor  Respiratory - equal breath sounds bilaterally, no wheezing or rales  CV - s1s2 regular rate and rhythm, no murmurs  Ext - no clubbing, no edema  Skin - no rashes  Psych - normal mood and affect    Pulmonary Tests:  Spirometry  06/06/12>>FEV1 1.48 (69%), FVC 1.95 (70%), FEV1% 76 PFT 07/27/12 >> FEV1 1.71 (88%), FEV1% 77, FEF 25-75% 1.44 (64%), TLC 3.58 (77%), DLCO 72%, no BD. Spirometry 10/24/17 >> FEV1 1.15 (63%), FEV1% 83 FeNO 10/24/17 >> 19  Chest imaging:  CT angio chest 09/23/20 >> GGO b/l with patchy ASD LUL HRCT 02/11/21 >> mild irregular peripheral interstitial opacity and some septal thickening at lung bases, air trapping (indeterminate for UIP)  Sleep Tests:  PSG 12/01/06>>AHI 10, SpO2 low 80%, PLMI 0  CPAP titration 02/16/07>>CPAP 11 cm H2O CPAP 01/20/22 to 02/18/22 >> used on 30 of 30 nights with average 10 hrs 2 min.  Average AHI 5.9 with CPAP 11 cm H2O  Cardiac Tests:  Echo 06/22/21 >> EF 57%  Social History:  She  reports that she has never smoked. She has never used smokeless tobacco. She reports current alcohol use of about 1.0 standard drink of alcohol per week. She reports that she does not use drugs.  Family History:  Her family history includes Angina (age of onset: 78) in her brother; Brain cancer in her maternal aunt; Breast cancer in her maternal aunt and another family member; Hypertension in her brother, father, and mother; Kidney cancer in her paternal uncle and another family member; Lung cancer in her mother; Ovarian cancer in her sister and another family member; Prostate cancer in her paternal uncle.     Assessment/Plan:   Obstructive sleep apnea. - she is compliant with CPAP and reports benefit - she uses [employer] for her DME - current CPAP ordered April 2019 - continue CPAP 11  cm H2O   Moderate persistent asthma. - symbicort wasn't covered by insurance - continue advair 115 two puffs bid - prn albuterol  Chronic diastolic CHF. - followed by Dr. Einar Gip  Left knee pain. - no pulmonary contra-indications to her having knee replacement surgery with Dr. Rhona Raider  Time Spent Involved in Patient Care on Day of Examination:  35 minutes  Follow up:   Patient Instructions  Okay  to proceed with knee surgery with Dr. Rhona Raider  Follow up in 1 year   Medication List:   Allergies as of 02/22/2022       Reactions   Nebivolol Hcl Other (See Comments)   Hypotensive episode   Bactrim [sulfamethoxazole-trimethoprim] Other (See Comments)   UNSPECIFIED   Dairy Aid [tilactase] Diarrhea, Other (See Comments)   Ice cream, sour cream   Lactose Intolerance (gi) Diarrhea   Microplegia Msa-msg [cardioplegia [place] Formula] Other (See Comments)   Unknown reaction    Adhesive [tape] Rash   Meloxicam Nausea Only   Other Nausea And Vomiting   general anesthesia   Sulfa Antibiotics Rash           Medication List        Accurate as of February 22, 2022  5:27 PM. If you have any questions, ask your nurse or doctor.          Advair HFA 115-21 MCG/ACT inhaler Generic drug: fluticasone-salmeterol USE 2 INHALATIONS TWICE A DAY   albuterol (2.5 MG/3ML) 0.083% nebulizer solution Commonly known as: PROVENTIL Take 3 mLs (2.5 mg dose) by nebulization every 6 (six) hours as needed for Wheezing.   albuterol 108 (90 Base) MCG/ACT inhaler Commonly known as: VENTOLIN HFA USE 2 INHALATIONS EVERY 6 HOURS AS NEEDED FOR WHEEZING OR SHORTNESS OF BREATH   B-complex with vitamin C tablet Take 1 tablet by mouth in the morning.   CALTRATE 600+D3 PO Take 1 tablet by mouth in the morning and at bedtime.   clobetasol ointment 0.05 % Commonly known as: TEMOVATE Apply 1 application topically 2 (two) times daily as needed (irritation).   diclofenac sodium 1 % Gel Commonly known as: VOLTAREN Apply 1 application  topically 4 (four) times daily as needed (pain.).   diltiazem 240 MG 24 hr capsule Commonly known as: CARDIZEM CD Take 240 mg by mouth in the morning.   diphenhydrAMINE 50 MG tablet Commonly known as: BENADRYL Take 100 mg by mouth in the morning, at noon, and at bedtime.   furosemide 40 MG tablet Commonly known as: LASIX Take 40 mg by mouth daily. What changed:  Another medication with the same name was removed. Continue taking this medication, and follow the directions you see here. Changed by: Chesley Mires, MD   hydrALAZINE 50 MG tablet Commonly known as: APRESOLINE TAKE 1 TABLET THREE TIMES A DAY   HYDROcodone-acetaminophen 5-325 MG tablet Commonly known as: NORCO/VICODIN Take 1-2 tablets by mouth See admin instructions. Take 2 tablets by mouth in the morning and evening and take 1 tablet at bedtime   KRILL OIL PO Take 1 capsule by mouth daily.   levothyroxine 88 MCG tablet Commonly known as: SYNTHROID Take 88 mcg by mouth daily before breakfast.   losartan 100 MG tablet Commonly known as: COZAAR Take 100 mg by mouth in the morning.   magnesium oxide 400 (240 Mg) MG tablet Commonly known as: MAG-OX Take 400 mg by mouth daily as needed (constipation).   methocarbamol 500 MG tablet Commonly known as: ROBAXIN Take 500 mg by mouth  daily.   multivitamin with minerals tablet Take 1 tablet by mouth daily at 12 noon. Centrum Silver for Women   naproxen sodium 220 MG tablet Commonly known as: ALEVE Take 440 mg by mouth 2 (two) times daily with a meal.   NON FORMULARY Pt uses a cpap nightly   OrthoVisc 30 MG/2ML Sosy intra-articular injection Generic drug: Hyaluronan Inject 6 mLs into the skin every 6 (six) months.   potassium chloride SA 20 MEQ tablet Commonly known as: Klor-Con M20 Take 3 tablets (60 mEq total) by mouth daily. What changed:  how much to take when to take this   Prolia 60 MG/ML Sosy injection Generic drug: denosumab Inject 60 mg into the skin every 6 (six) months.   sertraline 100 MG tablet Commonly known as: ZOLOFT Take 100 mg by mouth 2 (two) times daily.   Systane 0.4-0.3 % Soln Generic drug: Polyethyl Glycol-Propyl Glycol Place 1-2 drops into both eyes in the morning and at bedtime.   ZZZQUIL PO Take 3 tablets by mouth at bedtime.        Signature:  Chesley Mires, MD [place] Pager - [phone] 02/22/2022, 5:27 PM

## 2022-02-22 NOTE — Patient Instructions (Signed)
Okay to proceed with knee surgery with Dr. Rhona Raider  Follow up in 1 year

## 2022-02-23 ENCOUNTER — Ambulatory Visit (HOSPITAL_COMMUNITY): Payer: Medicare HMO | Admitting: Anesthesiology

## 2022-02-23 ENCOUNTER — Ambulatory Visit (HOSPITAL_BASED_OUTPATIENT_CLINIC_OR_DEPARTMENT_OTHER): Payer: Medicare HMO | Admitting: Anesthesiology

## 2022-02-23 ENCOUNTER — Encounter (HOSPITAL_COMMUNITY)
Admission: RE | Disposition: A | Discharge status: Home / Self Care | Payer: Self-pay | Source: Ambulatory Visit | Attending: Orthopaedic Surgery

## 2022-02-23 ENCOUNTER — Encounter (HOSPITAL_COMMUNITY): Payer: Self-pay | Admitting: Orthopaedic Surgery

## 2022-02-23 ENCOUNTER — Ambulatory Visit (HOSPITAL_COMMUNITY)
Admission: RE | Admit: 2022-02-23 | Discharge: 2022-02-23 | Disposition: A | Discharge status: Home / Self Care | Payer: Medicare HMO | Source: Ambulatory Visit | Attending: Orthopaedic Surgery | Admitting: Orthopaedic Surgery

## 2022-02-23 DIAGNOSIS — Z853 Personal history of malignant neoplasm of breast: Secondary | ICD-10-CM | POA: Insufficient documentation

## 2022-02-23 DIAGNOSIS — G4730 Sleep apnea, unspecified: Secondary | ICD-10-CM | POA: Insufficient documentation

## 2022-02-23 DIAGNOSIS — Z6841 Body Mass Index (BMI) 40.0 and over, adult: Secondary | ICD-10-CM | POA: Diagnosis not present

## 2022-02-23 DIAGNOSIS — M23201 Derangement of unspecified lateral meniscus due to old tear or injury, left knee: Secondary | ICD-10-CM | POA: Diagnosis not present

## 2022-02-23 DIAGNOSIS — M2342 Loose body in knee, left knee: Secondary | ICD-10-CM | POA: Diagnosis not present

## 2022-02-23 DIAGNOSIS — E039 Hypothyroidism, unspecified: Secondary | ICD-10-CM | POA: Diagnosis not present

## 2022-02-23 DIAGNOSIS — S83282A Other tear of lateral meniscus, current injury, left knee, initial encounter: Secondary | ICD-10-CM | POA: Diagnosis not present

## 2022-02-23 DIAGNOSIS — J449 Chronic obstructive pulmonary disease, unspecified: Secondary | ICD-10-CM | POA: Insufficient documentation

## 2022-02-23 DIAGNOSIS — M1712 Unilateral primary osteoarthritis, left knee: Secondary | ICD-10-CM | POA: Diagnosis not present

## 2022-02-23 DIAGNOSIS — I10 Essential (primary) hypertension: Secondary | ICD-10-CM

## 2022-02-23 HISTORY — PX: KNEE ARTHROSCOPY: SHX127

## 2022-02-23 SURGERY — ARTHROSCOPY, KNEE
Anesthesia: General | Site: Knee | Laterality: Left

## 2022-02-23 MED ORDER — OXYCODONE HCL 5 MG PO TABS: 5.0000 mg | ORAL_TABLET | Freq: Once | ORAL | Status: DC | PRN

## 2022-02-23 MED ORDER — HYDROMORPHONE HCL 1 MG/ML IJ SOLN
INTRAMUSCULAR | Status: DC | PRN
  Administered 2022-02-23 (×2): 1 mg via INTRAVENOUS

## 2022-02-23 MED ORDER — METHYLPREDNISOLONE ACETATE 40 MG/ML IJ SUSP
INTRAMUSCULAR | Status: AC
Start: 2022-02-23 — End: ?
  Filled 2022-02-23: qty 2

## 2022-02-23 MED ORDER — CEFAZOLIN SODIUM-DEXTROSE 2-4 GM/100ML-% IV SOLN
2.0000 g | INTRAVENOUS | Status: AC
  Administered 2022-02-23: 2 g via INTRAVENOUS
  Filled 2022-02-23: qty 100

## 2022-02-23 MED ORDER — CHLORHEXIDINE GLUCONATE 0.12 % MT SOLN: 15.0000 mL | Freq: Once | OROMUCOSAL | Status: DC

## 2022-02-23 MED ORDER — EPINEPHRINE (ANAPHYLAXIS) 1 MG/ML IJ SOLN
INTRAMUSCULAR | Status: DC | PRN
  Administered 2022-02-23: 1 mg

## 2022-02-23 MED ORDER — BUPIVACAINE-EPINEPHRINE 0.5% -1:200000 IJ SOLN
INTRAMUSCULAR | Status: DC | PRN
  Administered 2022-02-23: 20 mL

## 2022-02-23 MED ORDER — LACTATED RINGERS IV SOLN: INTRAVENOUS | Status: DC

## 2022-02-23 MED ORDER — BUPIVACAINE-EPINEPHRINE (PF) 0.5% -1:200000 IJ SOLN
INTRAMUSCULAR | Status: AC
  Filled 2022-02-23: qty 30

## 2022-02-23 MED ORDER — FENTANYL CITRATE (PF) 100 MCG/2ML IJ SOLN
INTRAMUSCULAR | Status: DC | PRN
  Administered 2022-02-23 (×2): 50 ug via INTRAVENOUS

## 2022-02-23 MED ORDER — ONDANSETRON HCL 4 MG/2ML IJ SOLN
INTRAMUSCULAR | Status: AC
  Filled 2022-02-23: qty 2

## 2022-02-23 MED ORDER — PROPOFOL 10 MG/ML IV BOLUS
INTRAVENOUS | Status: DC | PRN
  Administered 2022-02-23: 50 mg via INTRAVENOUS
  Administered 2022-02-23: 100 mg via INTRAVENOUS
  Administered 2022-02-23: 50 mg via INTRAVENOUS

## 2022-02-23 MED ORDER — ONDANSETRON HCL 4 MG/2ML IJ SOLN
INTRAMUSCULAR | Status: DC | PRN
  Administered 2022-02-23: 4 mg via INTRAVENOUS

## 2022-02-23 MED ORDER — DEXAMETHASONE SODIUM PHOSPHATE 10 MG/ML IJ SOLN
INTRAMUSCULAR | Status: DC | PRN
  Administered 2022-02-23: 10 mg via INTRAVENOUS

## 2022-02-23 MED ORDER — HYDROCODONE-ACETAMINOPHEN 5-325 MG PO TABS: 1.0000 | ORAL_TABLET | Freq: Four times a day (QID) | ORAL | 0 refills | Status: DC | PRN

## 2022-02-23 MED ORDER — FENTANYL CITRATE (PF) 100 MCG/2ML IJ SOLN
INTRAMUSCULAR | Status: AC
  Filled 2022-02-23: qty 2

## 2022-02-23 MED ORDER — PHENYLEPHRINE 80 MCG/ML (10ML) SYRINGE FOR IV PUSH (FOR BLOOD PRESSURE SUPPORT)
PREFILLED_SYRINGE | INTRAVENOUS | Status: DC | PRN
  Administered 2022-02-23: 160 ug via INTRAVENOUS
  Administered 2022-02-23: 80 ug via INTRAVENOUS
  Administered 2022-02-23: 160 ug via INTRAVENOUS

## 2022-02-23 MED ORDER — EPINEPHRINE PF 1 MG/ML IJ SOLN
INTRAMUSCULAR | Status: AC
  Filled 2022-02-23: qty 1

## 2022-02-23 MED ORDER — PROPOFOL 10 MG/ML IV BOLUS
INTRAVENOUS | Status: AC
  Filled 2022-02-23: qty 20

## 2022-02-23 MED ORDER — HYDROMORPHONE HCL 2 MG/ML IJ SOLN
INTRAMUSCULAR | Status: AC
  Filled 2022-02-23: qty 1

## 2022-02-23 MED ORDER — DEXAMETHASONE SODIUM PHOSPHATE 10 MG/ML IJ SOLN
INTRAMUSCULAR | Status: AC
  Filled 2022-02-23: qty 1

## 2022-02-23 MED ORDER — HYDROMORPHONE HCL 1 MG/ML IJ SOLN: 0.2500 mg | INTRAMUSCULAR | Status: DC | PRN

## 2022-02-23 MED ORDER — METHYLPREDNISOLONE ACETATE 80 MG/ML IJ SUSP
INTRAMUSCULAR | Status: DC | PRN
  Administered 2022-02-23: 80 mg

## 2022-02-23 MED ORDER — ONDANSETRON HCL 4 MG/2ML IJ SOLN: 4.0000 mg | Freq: Once | INTRAMUSCULAR | Status: DC | PRN

## 2022-02-23 MED ORDER — AMISULPRIDE (ANTIEMETIC) 5 MG/2ML IV SOLN
10.0000 mg | Freq: Once | INTRAVENOUS | Status: DC | PRN
Start: 2022-02-23 — End: 2022-02-23

## 2022-02-23 MED ORDER — ORAL CARE MOUTH RINSE: 15.0000 mL | Freq: Once | OROMUCOSAL | Status: DC

## 2022-02-23 MED ORDER — OXYCODONE HCL 5 MG/5ML PO SOLN: 5.0000 mg | Freq: Once | ORAL | Status: DC | PRN

## 2022-02-23 MED ORDER — SODIUM CHLORIDE 0.9 % IR SOLN
Status: DC | PRN
  Administered 2022-02-23: 3000 mL

## 2022-02-23 MED ORDER — LIDOCAINE 2% (20 MG/ML) 5 ML SYRINGE
INTRAMUSCULAR | Status: DC | PRN
  Administered 2022-02-23: 60 mg via INTRAVENOUS

## 2022-02-23 SURGICAL SUPPLY — 36 items
BAG COUNTER SPONGE SURGICOUNT (BAG) ×1 IMPLANT
BANDAGE COBAN STERILE 6 (GAUZE/BANDAGES/DRESSINGS) IMPLANT
BLADE EXCALIBUR 4.0X13 (MISCELLANEOUS) IMPLANT
BNDG ELASTIC 6X5.8 VLCR STR LF (GAUZE/BANDAGES/DRESSINGS) ×1 IMPLANT
BNDG GAUZE DERMACEA FLUFF (GAUZE/BANDAGES/DRESSINGS) ×1
BNDG GAUZE DERMACEA FLUFF 4 (GAUZE/BANDAGES/DRESSINGS) ×1 IMPLANT
BONE TUNNEL PLUG CANNULATED (MISCELLANEOUS) ×1 IMPLANT
BURR OVAL 8 FLU 4.0X13 (MISCELLANEOUS) ×1 IMPLANT
COVER SURGICAL LIGHT HANDLE (MISCELLANEOUS) IMPLANT
DISSECTOR 3.5MM X 13CM (MISCELLANEOUS) ×1 IMPLANT
DRAPE ARTHROSCOPY W/POUCH 114 (DRAPES) ×1 IMPLANT
DRAPE SHEET LG 3/4 BI-LAMINATE (DRAPES) ×1 IMPLANT
DRAPE U-SHAPE 47X51 STRL (DRAPES) ×1 IMPLANT
DRSG ADAPTIC 3X8 NADH LF (GAUZE/BANDAGES/DRESSINGS) IMPLANT
DRSG EMULSION OIL 3X3 NADH (GAUZE/BANDAGES/DRESSINGS) ×1 IMPLANT
DRSG PAD ABDOMINAL 8X10 ST (GAUZE/BANDAGES/DRESSINGS) ×1 IMPLANT
DURAPREP 26ML APPLICATOR (WOUND CARE) ×2 IMPLANT
GAUZE 4X4 16PLY ~~LOC~~+RFID DBL (SPONGE) ×1 IMPLANT
GAUZE SPONGE 4X4 12PLY STRL (GAUZE/BANDAGES/DRESSINGS) ×1 IMPLANT
GLOVE BIO SURGEON STRL SZ8 (GLOVE) ×2 IMPLANT
GLOVE BIOGEL PI IND STRL 8 (GLOVE) ×2 IMPLANT
GLOVE BIOGEL PI INDICATOR 8 (GLOVE) ×2
GOWN STRL REUS W/ TWL XL LVL3 (GOWN DISPOSABLE) ×2 IMPLANT
GOWN STRL REUS W/TWL XL LVL3 (GOWN DISPOSABLE) ×2
KIT BASIN OR (CUSTOM PROCEDURE TRAY) ×1 IMPLANT
MANIFOLD NEPTUNE II (INSTRUMENTS) ×1 IMPLANT
NDL SPNL 18GX3.5 QUINCKE PK (NEEDLE) IMPLANT
NEEDLE HYPO 22GX1.5 SAFETY (NEEDLE) ×1 IMPLANT
NEEDLE SPNL 18GX3.5 QUINCKE PK (NEEDLE) IMPLANT
PACK ARTHROSCOPY WL (CUSTOM PROCEDURE TRAY) ×1 IMPLANT
PAD ARMBOARD 7.5X6 YLW CONV (MISCELLANEOUS) ×2 IMPLANT
PENCIL SMOKE EVACUATOR (MISCELLANEOUS) IMPLANT
SYR CONTROL 10ML LL (SYRINGE) ×1 IMPLANT
TOWEL OR 17X26 10 PK STRL BLUE (TOWEL DISPOSABLE) ×1 IMPLANT
TUBING ARTHROSCOPY IRRIG 16FT (MISCELLANEOUS) ×1 IMPLANT
WATER STERILE IRR 1000ML POUR (IV SOLUTION) ×1 IMPLANT

## 2022-02-23 NOTE — Transfer of Care (Signed)
Immediate Anesthesia Transfer of Care Note  Patient: Nhyla A Schimpf  Procedure(s) Performed: LEFT KNEE ARTHROSCOPY (Left: Knee)  Patient Location: PACU  Anesthesia Type:General  Level of Consciousness: awake, alert  and oriented  Airway & Oxygen Therapy: Patient Spontanous Breathing and Patient connected to face mask oxygen  Post-op Assessment: Report given to RN and Post -op Vital signs reviewed and stable  Post vital signs: Reviewed and stable  Last Vitals:  Vitals Value Taken Time  BP 131/65 02/23/22 1551  Temp    Pulse 64 02/23/22 1555  Resp 20 02/23/22 1555  SpO2 98 % 02/23/22 1555  Vitals shown include unvalidated device data.  Last Pain:  Vitals:   02/23/22 1416  TempSrc:   PainSc: 0-No pain         Complications: No notable events documented.

## 2022-02-23 NOTE — Interval H&P Note (Signed)
History and Physical Interval Note:  02/23/2022 2:12 PM  [name]  has presented today for surgery, with the diagnosis of LEFT KNEE LATERAL MENISCUS TEAR, LOSSE BODIES, AND DEGENERATIVE JOINT DISEASE.  The various methods of treatment have been discussed with the patient and family. After consideration of risks, benefits and other options for treatment, the patient has consented to  Procedure(s): LEFT KNEE ARTHROSCOPY (Left) as a surgical intervention.  The patient's history has been reviewed, patient examined, no change in status, stable for surgery.  I have reviewed the patient's chart and labs.  Questions were answered to the patient's satisfaction.     Hessie Dibble

## 2022-02-23 NOTE — Op Note (Signed)
NAMEJALYNE, [name] MEDICAL RECORD NO: [id number] ACCOUNT NO: [id number] DATE OF BIRTH: 10/14/1940 FACILITY: [facility] LOCATION: [facility] PHYSICIAN: Monico Blitz. Rhona Raider, MD  Operative Report   DATE OF PROCEDURE: 02/23/2022  PREOPERATIVE DIAGNOSIS:    1.  Left knee torn lateral meniscus. 2.  Left knee degenerative joint disease. 3.  Left knee loose body.  POSTOPERATIVE DIAGNOSIS:   1.  Left knee torn lateral meniscus. 2.  Left knee degenerative joint disease. 3.  Left knee loose body.  PROCEDURES:   1.  Left knee partial lateral meniscectomy. 2.  Left knee abrasion chondroplasty of patellofemoral and lateral. 3.  Left knee removal of loose body.  ANESTHESIA:  General.  ATTENDING SURGEON:  Monico Blitz. Rhona Raider, MD.  ASSISTANTLoni Dolly, PA.  INDICATIONS FOR PROCEDURE:  The patient is a 81 year old woman with a long history of left knee pain related to torn and worn cartilage, which has been documented on MRI scan.  She has failed various nonoperative measures such as injections and bracing  and therapy.  She has pain, which limits her ability to walk safely.  She is not a candidate for knee replacement surgery due to her weight and she is offered a knee arthroscopy.  Informed operative consent was obtained after discussion of possible  complications including reaction to anesthesia and infection.  SUMMARY OF FINDINGS OF PROCEDURE:  Under general anesthesia, an arthroscopy of the left knee was performed.  Suprapatellar pouch was benign while the patellofemoral joint exhibited grade 3 and 4 change across both surfaces.  Thorough chondroplasty along  with abrasion of bleeding bone was performed on both aspects.  Medial compartment was notable for no evidence of meniscal injury, but she did have some mild chondromalacia addressed with a chondroplasty.  ACL was intact.  She had a loose body in the  intercondylar notch, which measured a centimeter in diameter and was bony in nature.  This  was removed.  In the lateral compartment, she had a torn lateral meniscus addressed with about a 15% partial lateral meniscectomy done with basket and shaver.  She  also had some grade 4 change especially on the tibial plateau and a thorough abrasion of bleeding bone was done here.  She was scheduled to go home same day.  DESCRIPTION OF PROCEDURE:  The patient was taken to the operating suite where general anesthetic was applied without difficulty.  She was positioned supine and prepped and draped in normal sterile fashion.  After the administration of preoperative IV  Kefzol and appropriate time-out, an arthroscopy of the left knee was performed through total of 2 portals.  Findings were as noted above and procedure consisted of the partial lateral meniscectomy along with removal of loose body and the abrasion  chondroplasties as described above.  The knee was thoroughly irrigated at the end of the case, followed by placement of Marcaine with epinephrine and morphine plus some Depo-Medrol.  We placed dry gauze and a loose Coban wrap.  Estimated blood loss and  intraoperative fluids can be obtained from anesthesia records.  DISPOSITION:  The patient was extubated in the operating room and taken to recovery room in stable condition.  Plans were for her to go home same day and follow up in the office in less than a week.  I will contact her by phone tonight.     SUJ D: 02/23/2022 4:58:49 pm T: 02/23/2022 10:34:00 pm  JOB: [id number]/ [id number]

## 2022-02-23 NOTE — Anesthesia Preprocedure Evaluation (Addendum)
Anesthesia Evaluation  Patient identified by MRN, date of birth, ID band Patient awake    Reviewed: Allergy & Precautions, NPO status , Patient's Chart, lab work & pertinent test results  History of Anesthesia Complications (+) PONV and history of anesthetic complications  Airway Mallampati: I  TM Distance: >3 FB Neck ROM: Full    Dental  (+) Caps, Dental Advisory Given   Pulmonary shortness of breath, sleep apnea and Continuous Positive Airway Pressure Ventilation , COPD,  COPD inhaler,    breath sounds clear to auscultation       Cardiovascular hypertension, Pt. on medications (-) angina+ DOE   Rhythm:Regular Rate:Normal  '21 ECHO: EF 57%, mod LVH with normal LVF, mild MR, mild TR '20 Stress: normal perfusion with EF 69%   Neuro/Psych Anxiety Compression fracture    GI/Hepatic negative GI ROS, Neg liver ROS,   Endo/Other  Hypothyroidism Morbid obesity  Renal/GU negative Renal ROS     Musculoskeletal  (+) Arthritis , Osteoarthritis,    Abdominal (+) + obese,   Peds  Hematology  (+) Blood dyscrasia, anemia ,   Anesthesia Other Findings Breast cancer  Reproductive/Obstetrics                             Anesthesia Physical  Anesthesia Plan  ASA: 3  Anesthesia Plan: General   Post-op Pain Management: Tylenol PO (pre-op)   Induction: Intravenous  PONV Risk Score and Plan: 4 or greater and Ondansetron, Dexamethasone and Treatment may vary due to age or medical condition  Airway Management Planned: LMA  Additional Equipment: None  Intra-op Plan:   Post-operative Plan: Extubation in OR  Informed Consent:     Dental advisory given  Plan Discussed with: CRNA and Anesthesiologist  Anesthesia Plan Comments: (PAT note written 06/03/2021 by Myra Gianotti, PA-C. History includes never smoker, post-operative N/V, HTN, asthma, OSA, hypothyroidism, breast cancer (left modified  radical mastectomy w/ axillary lymph node dissection 1988 for "multifocal microscopic invasive ductal breast cancer", s/p chemotherapy; left latissimus flap 5456 initially complicated by necrosis, redo left breast reconstruction with implant & right reduction mammaplasty for symmetry), anemia, collagenous colitis, lumbar compression fracture (s/p L1 kyphoplasty 02/18/16), COVID-19 (07/18/20).  Last pulmonology visit with Dr. Halford Chessman on 03/03/21 for follow-up OSA, moderate persistent asthma, chronic dyspnea on exertion, and COVID-19 pneumonia (07/2020 with asthma exacerbation and CAP 09/2020, required home O2). She was compliant with CPAP (11 cm H2O). Continue Advair with as needed albuterol for asthma (insurance would not cover Symbicort). 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  She had no significant respiratory symptoms at that time, continue to monitor clinically.  8-monthfollow-up planned.  Last cardiology visit 12/23/20 with CLawerance Cruel PA-C for follow-up HTN and chronic diastolic CHF.  She noted patient's dyspnea on exertion had significantly improved since previous visit.  No clinical signs of acute decompensated heart failure at that time.  Patient did report increased need for additional Lasix doses so Lasix increased to 80 mg daily and KCl supplement increased as well. Six mont follow-up planned with routine echo prior to visit (echo. (Office visit scheduled 07/01/21 with echo 06/22/21.) In the interim, she did have a Remote Patient Monitoring/Principle Care Monitoring visit on 05/04/21 with [facility] with [facility] and "Patient is currentlycontrolledon the following medications: hydralazine 50 mg TID, lasix 80 mg, diltiazem 240 mg, losartan 100 mg".)  Anesthesia Quick Evaluation  

## 2022-02-23 NOTE — Telephone Encounter (Signed)
OV notes and clearance form have been faxed back to [place]. Nothing further needed at this time.

## 2022-02-23 NOTE — Anesthesia Procedure Notes (Signed)
Procedure Name: LMA Insertion Date/Time: 02/23/2022 3:10 PM  Performed by: Naly Schwanz D, CRNAPre-anesthesia Checklist: Patient identified, Emergency Drugs available, Suction available and Patient being monitored Patient Re-evaluated:Patient Re-evaluated prior to induction Oxygen Delivery Method: Circle system utilized Preoxygenation: Pre-oxygenation with 100% oxygen Induction Type: IV induction Ventilation: Mask ventilation without difficulty LMA: LMA inserted LMA Size: 4.0 Tube type: Oral Number of attempts: 1 Placement Confirmation: positive ETCO2 and breath sounds checked- equal and bilateral Tube secured with: Tape Dental Injury: Teeth and Oropharynx as per pre-operative assessment

## 2022-02-23 NOTE — Brief Op Note (Signed)
[name] [id number] 02/23/2022   PRE-OP DIAGNOSIS: left knee DJD and TLM  POST-OP DIAGNOSIS: same  PROCEDURE: left knee scope  ANESTHESIA: general  Hessie Dibble   Dictation #:  [id number]

## 2022-02-24 ENCOUNTER — Encounter (HOSPITAL_COMMUNITY): Payer: Self-pay | Admitting: Orthopaedic Surgery

## 2022-02-24 NOTE — Anesthesia Postprocedure Evaluation (Signed)
Anesthesia Post Note  Patient: [name]  Procedure(s) Performed: LEFT KNEE ARTHROSCOPY (Left: Knee)     Patient location during evaluation: PACU Anesthesia Type: General Level of consciousness: awake and alert Pain management: pain level controlled Vital Signs Assessment: post-procedure vital signs reviewed and stable Respiratory status: spontaneous breathing, nonlabored ventilation and respiratory function stable Cardiovascular status: blood pressure returned to baseline and stable Postop Assessment: no apparent nausea or vomiting Anesthetic complications: no   No notable events documented.  Last Vitals:  Vitals:   02/23/22 1715 02/23/22 1748  BP: (!) 142/73 (!) 152/72  Pulse: (!) 47   Resp:    Temp:    SpO2: 92% 100%    Last Pain:  Vitals:   02/23/22 1715  TempSrc:   PainSc: 0-No pain   Pain Goal:                   Lynda Rainwater

## 2022-03-23 ENCOUNTER — Other Ambulatory Visit: Payer: Self-pay

## 2022-03-23 MED ORDER — POTASSIUM CHLORIDE CRYS ER 20 MEQ PO TBCR: 20.0000 meq | EXTENDED_RELEASE_TABLET | Freq: Three times a day (TID) | ORAL | 0 refills | Status: DC

## 2022-03-31 NOTE — Therapy (Signed)
OUTPATIENT PHYSICAL THERAPY LOWER EXTREMITY EVALUATION   Patient Name: [name] MRN: [id number] DOB:September 09, 1940, 81 y.o., female Today's Date: 04/02/2022   PT End of Session - 04/01/22 1400     Visit Number 1    Number of Visits 13    Date for PT Re-Evaluation 05/15/22    Authorization Type [employer] MCR    Progress Note Due on Visit 10    PT Start Time 1400    PT Stop Time 1438    PT Time Calculation (min) 38 min    Equipment Utilized During Treatment Other (comment)   [place]   Activity Tolerance Patient tolerated treatment well    Behavior During Therapy WFL for tasks assessed/performed             Past Medical History:  Diagnosis Date   Anemia    PMH;  Patient denies this dx as of 06/03/21   Anxiety    Asthma    Breast cancer ([place]) 1988   left breast cancer   Collagenous colitis    Compression fracture    lumbar 1   Dyspnea    with exertion   Hypertension    Hypothyroidism    Osteoarthritis    Osteopenia    Pneumonia 07/2020   PONV (postoperative nausea and vomiting)    Sleep apnea    wears CPAP nightly   Past Surgical History:  Procedure Laterality Date   BREAST REDUCTION SURGERY     Right   CATARACT EXTRACTION W/ INTRAOCULAR LENS  IMPLANT, BILATERAL     CESAREAN SECTION     COLONOSCOPY W/ BIOPSIES AND POLYPECTOMY     CYST EXCISION N/A 08/30/2018   Procedure: excision of posterior shoulder / back sebaceous cyst;  Surgeon: Wallace Going, DO;  Location: [facility];  Service: Plastics;  Laterality: N/A;   KNEE ARTHROSCOPY Left 02/23/2022   Procedure: LEFT KNEE ARTHROSCOPY;  Surgeon: Melrose Nakayama, MD;  Location: [facility];  Service: Orthopedics;  Laterality: Left;   KYPHOPLASTY N/A 02/18/2016   Procedure: LUMBAR 1 KYPHOPLASTY;  Surgeon: Phylliss Bob, MD;  Location: [facility];  Service: Orthopedics;  Laterality: N/A;  LUMBAR 1 KYPHOPLASTY   KYPHOPLASTY N/A 06/04/2021   Procedure: LUMBAR 2 KYPHOPLASTY;  Surgeon: Phylliss Bob, MD;   Location: [place] [facility];  Service: Orthopedics;  Laterality: N/A;   LAPAROSCOPIC APPENDECTOMY N/A 12/09/2021   Procedure: APPENDECTOMY LAPAROSCOPIC;  Surgeon: Ileana Roup, MD;  Location: [facility];  Service: General;  Laterality: N/A;   LUMBAR DISC SURGERY     MODIFIED RADICAL MASTECTOMY W/ AXILLARY LYMPH NODE DISSECTION     Left   RECONSTRUCTION BREAST W/ LATISSIMUS DORSI FLAP     left   REDUCTION MAMMAPLASTY Right    right foot surgery     corrected hammer toe, straightened big toe   SEPTOPLASTY     TOE SURGERY     TONSILLECTOMY AND ADENOIDECTOMY     Patient Active Problem List   Diagnosis Date Noted   Acute appendicitis 12/09/2021   S/P laparoscopic appendectomy 12/09/2021   CAP (community acquired pneumonia) 09/30/2020   Chronic respiratory failure with hypoxia ([place]) 09/23/2020   Allergic rhinitis 08/31/2019   Pilar cyst 08/04/2018   History of breast cancer in female 06/09/2018   History of reconstruction of both breasts 06/09/2018   Severe obesity (BMI >= 40) ([place]) 09/16/2015   Asthma with acute exacerbation 09/15/2015   DOE (dyspnea on exertion) 01/11/2014   Insomnia 05/05/2012   Osteoarthritis    Osteopenia  Collagenous colitis    OSA (obstructive sleep apnea)    Hypothyroidism    Mild persistent asthma    Hypertension    Breast cancer, left breast ([place])     PCP: [name]  REFERRING PROVIDER: Frederik Pear, MD  REFERRING DIAG: s/p left knee scope  THERAPY DIAG:  Chronic pain of left knee  Muscle weakness (generalized)  Other abnormalities of gait and mobility  Rationale for Evaluation and Treatment Rehabilitation  ONSET DATE: 02/23/22  SUBJECTIVE:   SUBJECTIVE STATEMENT: Patient had Lt knee surgery on 02/23/22 and was doing well for about a week. She then went to make potato salad about a week after surgery and then had increased pain in the knee following this. She had f/u with physician the following week and was told that she had  "overdone it." She feels that the knee is still not feeling as good as it was the first week out of surgery. She was told that she is not a candidate for TKA due to her weight and is working on this.   PERTINENT HISTORY: Breast cancer Anxiety Asthma Compression fracture  Osteopenia   PAIN:  Are you having pain? Currently 0/10 Yes: NPRS scale: 8 (at worst)/10 Pain location: anterior Lt knee Pain description: sharp Aggravating factors: prolonged standing/walking, activity in general Relieving factors: rest with pillow under the knee  PRECAUTIONS: None  WEIGHT BEARING RESTRICTIONS No  FALLS:  Has patient fallen in last 6 months? No  LIVING ENVIRONMENT: Lives with: lives with their family Lives in: House/apartment Stairs: No Has following equipment at home: Single point cane, Environmental consultant - 4 wheeled, shower chair, and Grab bars  OCCUPATION: retired   PLOF: [place] "I want to walk without pain." "Be independent with community activity."   OBJECTIVE:   DIAGNOSTIC FINDINGS:  Lt knee MRI: IMPRESSION: 1. Advanced tricompartmental osteoarthritis, most severe within the lateral compartment. 2. Advanced degeneration and tearing throughout the lateral meniscus. 3. Small joint effusion with multiple intra-articular loose bodies.  PATIENT SURVEYS:  FOTO 37% function to 52% predicted  COGNITION:  Overall cognitive status: Within functional limits for tasks assessed     SENSATION: Not tested  EDEMA:  No obvious swelling about the Lt knee    POSTURE: rounded shoulders and forward head  PALPATION: TTP lateral joint line   LOWER EXTREMITY ROM:  Active ROM Right eval Left eval  Hip flexion    Hip extension    Hip abduction    Hip adduction    Hip internal rotation    Hip external rotation    Knee flexion 120 109  Knee extension Unable to accurately assess due to hip flexor tightness  Unable to accurately assess due to hip flexor tightness   Ankle  dorsiflexion    Ankle plantarflexion    Ankle inversion    Ankle eversion     (Blank rows = not tested)  LOWER EXTREMITY MMT:  MMT Right eval Left eval  Hip flexion 4+ 4  Hip extension    Hip abduction    Hip adduction    Hip internal rotation    Hip external rotation    Knee flexion 5 4+  Knee extension 5 3-  Ankle dorsiflexion    Ankle plantarflexion    Ankle inversion    Ankle eversion     (Blank rows = not tested)  LOWER EXTREMITY SPECIAL TESTS:  None   FUNCTIONAL TESTS:  5 x STS 14.65 seconds TUG 23.8 seconds  GAIT: Distance walked: 10 ft  Assistive device utilized: Single point cane Level of assistance: Modified independence Comments: limited knee flexion during stance and swing; WBOS    TODAY'S TREATMENT: [facility] Adult PT Treatment:                                                DATE: 04/01/22 Therapeutic Exercise: Demonstrated and issue initial HEP.   Therapeutic Activity: Education on assessment findings that will be addressed throughout duration of POC.      PATIENT EDUCATION:  Education details: see treatment  Person educated: Patient Education method: Explanation, Demonstration, Tactile cues, Verbal cues, and Handouts Education comprehension: verbalized understanding, returned demonstration, verbal cues required, tactile cues required, and needs further education   HOME EXERCISE PROGRAM: Access Code: [email or web address] URL: [email or web address] Date: 04/01/2022 Prepared by: Gwendolyn Grant  Exercises - Standing March with Counter Support  - 2 x daily - 7 x weekly - 2 sets - 10 reps - Supine Heel Slide  - 2 x daily - 7 x weekly - 1 sets - 10 reps - 5 sec  hold - Supine Quad Set  - 2 x daily - 7 x weekly - 2 sets - 10 reps - 5 sec  hold - Seated Knee Extension AAROM  - 2 x daily - 7 x weekly - 1 sets - 10 reps - 5 sec  hold  ASSESSMENT:  CLINICAL IMPRESSION: Patient is a 81 y.o. female who was seen today for physical therapy  evaluation and treatment for s/p Lt knee partial lateral meniscectomy on 02/23/22. She demonstrates ROM, strength, gait and balance deficits that are consistent with her post-operative status. She will benefit from skilled PT to address the above stated deficits in order to return to optimal function.     OBJECTIVE IMPAIRMENTS Abnormal gait, decreased activity tolerance, decreased balance, difficulty walking, decreased ROM, decreased strength, improper body mechanics, postural dysfunction, and pain.   ACTIVITY LIMITATIONS carrying, lifting, bending, standing, squatting, stairs, transfers, and locomotion level  PARTICIPATION LIMITATIONS: meal prep, cleaning, laundry, shopping, and community activity  PERSONAL FACTORS Age, Fitness, Time since onset of injury/illness/exacerbation, and 3+ comorbidities: see PMH above  are also affecting patient's functional outcome.   REHAB POTENTIAL: Good  CLINICAL DECISION MAKING: Evolving/moderate complexity  EVALUATION COMPLEXITY: Moderate   GOALS: Goals reviewed with patient? Yes  SHORT TERM GOALS: Target date: 04/23/22 Patient will be independent and compliant with initial HEP.   Baseline: issued at eval  Goal status: INITIAL  2.  Patient will demonstrate at least 120 degrees of Lt knee flexion AROM to improve gait mechanics.  Baseline: see above  Goal status: INITIAL  3.  Patient will complete TUG in </= 18 seconds to signify a reduction in fall risk.  Baseline: see above  Goal status: INITIAL   LONG TERM GOALS: Target date: 05/14/22  Patient will demonstrate at least 4/5 Lt knee extension strength to improve ability to complete stair and curb negotiation.  Baseline: see above  Goal status: INITIAL  2.  Patient will report pain at worst rated as </=5/10 to reduce her current functional limitations.  Baseline: 8/10  Goal status: INITIAL  3.  Patient will be able to participate in community activity independently.  Baseline: unable to  attend community events independently at this time due to pain.  Goal status: INITIAL  4.  Patient will  score at least 52% on FOTO to signify clinically meaningful improvement in functional abilities.   Baseline: 37 Goal status: INITIAL   PLAN: PT FREQUENCY: 2x/week  PT DURATION: 6 weeks  PLANNED INTERVENTIONS: Therapeutic exercises, Therapeutic activity, Neuromuscular re-education, Balance training, Gait training, Patient/Family education, Self Care, Joint mobilization, Stair training, Aquatic Therapy, Dry Needling, Cryotherapy, Moist heat, Vasopneumatic device, Manual therapy, and Re-evaluation  PLAN FOR NEXT SESSION: review and progress HEP prn; manual to knee; focus on restoring flexion, hip flexor stretching, quad strengthening.   Gwendolyn Grant, PT, DPT, ATC 04/02/22 8:32 AM

## 2022-04-01 ENCOUNTER — Ambulatory Visit: Payer: Medicare HMO | Attending: Orthopaedic Surgery

## 2022-04-01 DIAGNOSIS — M25562 Pain in left knee: Secondary | ICD-10-CM | POA: Insufficient documentation

## 2022-04-01 DIAGNOSIS — R2689 Other abnormalities of gait and mobility: Secondary | ICD-10-CM | POA: Diagnosis present

## 2022-04-01 DIAGNOSIS — G8929 Other chronic pain: Secondary | ICD-10-CM | POA: Insufficient documentation

## 2022-04-01 DIAGNOSIS — M6281 Muscle weakness (generalized): Secondary | ICD-10-CM

## 2022-04-02 ENCOUNTER — Telehealth: Payer: Self-pay | Admitting: Cardiology

## 2022-04-02 NOTE — Telephone Encounter (Signed)
Patient says she needs refill on her potassium. [facility] on [address] Will make f/u appt if/when necessary, it has been less than 1 year currently.

## 2022-04-02 NOTE — Telephone Encounter (Signed)
Spoke with patient and she states that she will make an appointment with Ernst Spell  NP

## 2022-04-07 ENCOUNTER — Ambulatory Visit: Payer: Medicare HMO | Attending: Orthopaedic Surgery

## 2022-04-07 DIAGNOSIS — R2689 Other abnormalities of gait and mobility: Secondary | ICD-10-CM | POA: Diagnosis present

## 2022-04-07 DIAGNOSIS — M6281 Muscle weakness (generalized): Secondary | ICD-10-CM | POA: Diagnosis present

## 2022-04-07 DIAGNOSIS — M25562 Pain in left knee: Secondary | ICD-10-CM | POA: Insufficient documentation

## 2022-04-07 DIAGNOSIS — G8929 Other chronic pain: Secondary | ICD-10-CM | POA: Diagnosis present

## 2022-04-07 NOTE — Therapy (Signed)
OUTPATIENT PHYSICAL THERAPY TREATMENT NOTE   Patient Name: [name] MRN: [id number] DOB:[birth year], 81 y.o., female Today's Date: 04/07/2022  PCP: [name] REFERRING PROVIDER: Frederik Pear, MD  END OF SESSION:   PT End of Session - 04/07/22 1524     Visit Number 2    Number of Visits 13    Date for PT Re-Evaluation 05/15/22    Authorization Type Aet[employer] MCR    Authorization Time Period FOTO V6, V10, kx mod v15    Progress Note Due on Visit 10    PT Start Time 1528    PT Stop Time 1610    PT Time Calculation (min) 42 min    Equipment Utilized During Treatment Gait belt    Activity Tolerance Patient tolerated treatment well    Behavior During Therapy WFL for tasks assessed/performed             Past Medical History:  Diagnosis Date   Anemia    PMH;  Patient denies this dx as of 06/03/21   Anxiety    Asthma    Breast cancer ([place]) 1988   left breast cancer   Collagenous colitis    Compression fracture    lumbar 1   Dyspnea    with exertion   Hypertension    Hypothyroidism    Osteoarthritis    Osteopenia    Pneumonia 07/2020   PONV (postoperative nausea and vomiting)    Sleep apnea    wears CPAP nightly   Past Surgical History:  Procedure Laterality Date   BREAST REDUCTION SURGERY     Right   CATARACT EXTRACTION W/ INTRAOCULAR LENS  IMPLANT, BILATERAL     CESAREAN SECTION     COLONOSCOPY W/ BIOPSIES AND POLYPECTOMY     CYST EXCISION N/A 08/30/2018   Procedure: excision of posterior shoulder / back sebaceous cyst;  Surgeon: Wallace Going, DO;  Location: [facility];  Service: Plastics;  Laterality: N/A;   KNEE ARTHROSCOPY Left 02/23/2022   Procedure: LEFT KNEE ARTHROSCOPY;  Surgeon: Melrose Nakayama, MD;  Location[facility];  Service: Orthopedics;  Laterality: Left;   KYPHOPLASTY N/A 02/18/2016   Procedure: LUMBAR 1 KYPHOPLASTY;  Surgeon: Phylliss Bob, MD;  Location: [facility];  Service: Orthopedics;  Laterality: N/A;   LUMBAR 1 KYPHOPLASTY   KYPHOPLASTY N/A 06/04/2021   Procedure: LUMBAR 2 KYPHOPLASTY;  Surgeon: Phylliss Bob, MD;  Location: [facility];  Service: Orthopedics;  Laterality: N/A;   LAPAROSCOPIC APPENDECTOMY N/A 12/09/2021   Procedure: APPENDECTOMY LAPAROSCOPIC;  Surgeon: Ileana Roup, MD;  Location: [facility];  Service: General;  Laterality: N/A;   LUMBAR DISC SURGERY     MODIFIED RADICAL MASTECTOMY W/ AXILLARY LYMPH NODE DISSECTION     Left   RECONSTRUCTION BREAST W/ LATISSIMUS DORSI FLAP     left   REDUCTION MAMMAPLASTY Right    right foot surgery     corrected hammer toe, straightened big toe   SEPTOPLASTY     TOE SURGERY     TONSILLECTOMY AND ADENOIDECTOMY     Patient Active Problem List   Diagnosis Date Noted   Acute appendicitis 12/09/2021   S/P laparoscopic appendectomy 12/09/2021   CAP (community acquired pneumonia) 09/30/2020   Chronic respiratory failure with hypoxia ([place]) 09/23/2020   Allergic rhinitis 08/31/2019   Pilar cyst 08/04/2018   History of breast cancer in female 06/09/2018   History of reconstruction of both breasts 06/09/2018   Severe obesity (BMI >= 40) ([place]) 09/16/2015  Asthma with acute exacerbation 09/15/2015   DOE (dyspnea on exertion) 01/11/2014   Insomnia 05/05/2012   Osteoarthritis    Osteopenia    Collagenous colitis    OSA (obstructive sleep apnea)    Hypothyroidism    Mild persistent asthma    Hypertension    Breast cancer, left breast ([facility])     REFERRING DIAG: s/p left knee scope  THERAPY DIAG:  Chronic pain of left knee  Muscle weakness (generalized)  Other abnormalities of gait and mobility  Rationale for Evaluation and Treatment Rehabilitation  PERTINENT HISTORY: Lt knee partial lateral meniscectomy on 02/23/22, breast cx, anxiety, asthma, compression fx, osteopenia  PRECAUTIONS: Falls  SUBJECTIVE: Pt reports 4/10 Lt knee pain today, adding that she has had difficulty with walking. She reports adherence to her HEP.   PAIN:   Are you having pain? Currently 4/10 Yes: NPRS scale: 8 (at worst)/10 Pain location: anterior Lt knee Pain description: sharp Aggravating factors: prolonged standing/walking, activity in general Relieving factors: rest with pillow under the knee   OBJECTIVE: (objective measures completed at initial evaluation unless otherwise dated)   DIAGNOSTIC FINDINGS:  Lt knee MRI: IMPRESSION: 1. Advanced tricompartmental osteoarthritis, most severe within the lateral compartment. 2. Advanced degeneration and tearing throughout the lateral meniscus. 3. Small joint effusion with multiple intra-articular loose bodies.   PATIENT SURVEYS:  FOTO 37% function to 52% predicted   COGNITION:           Overall cognitive status: Within functional limits for tasks assessed                          SENSATION: Not tested   EDEMA:  No obvious swelling about the Lt knee      POSTURE: rounded shoulders and forward head   PALPATION: TTP lateral joint line    LOWER EXTREMITY ROM:   Active ROM Right eval Left eval  Hip flexion      Hip extension      Hip abduction      Hip adduction      Hip internal rotation      Hip external rotation      Knee flexion 120 109  Knee extension Unable to accurately assess due to hip flexor tightness  Unable to accurately assess due to hip flexor tightness   Ankle dorsiflexion      Ankle plantarflexion      Ankle inversion      Ankle eversion       (Blank rows = not tested)   LOWER EXTREMITY MMT:   MMT Right eval Left eval  Hip flexion 4+ 4  Hip extension      Hip abduction      Hip adduction      Hip internal rotation      Hip external rotation      Knee flexion 5 4+  Knee extension 5 3-  Ankle dorsiflexion      Ankle plantarflexion      Ankle inversion      Ankle eversion       (Blank rows = not tested)   LOWER EXTREMITY SPECIAL TESTS:  None    FUNCTIONAL TESTS:  5 x STS 14.65 seconds TUG 23.8 seconds    GAIT: Distance walked: 10 ft   Assistive device utilized: Single point cane Level of assistance: Modified independence Comments: limited knee flexion during stance and swing; WBOS       TODAY'S TREATMENT:  [facility] Adult PT  Treatment:                                                DATE: 04/07/2022 Therapeutic Exercise: Mini squat with UE support 3x10 Seated BIL knee extension machine from 90d flexion to 45d flexion x12 with 15#, x12 with 10#, x12 with 5# Standing Lt hamstring curl with 7# cable to ankle 3x10 Seated clamshells with blue band around thighs 3x10 Seated marching with blue band around thighs 3x10 BIL Manual Therapy: N/A Neuromuscular re-ed: N/A Therapeutic Activity: N/A Modalities: N/A Self Care: N/A   [facility] Adult PT Treatment:                                                DATE: 04/01/22 Therapeutic Exercise: Demonstrated and issue initial HEP.    Therapeutic Activity: Education on assessment findings that will be addressed throughout duration of POC.          PATIENT EDUCATION:  Education details: see treatment  Person educated: Patient Education method: Explanation, Demonstration, Tactile cues, Verbal cues, and Handouts Education comprehension: verbalized understanding, returned demonstration, verbal cues required, tactile cues required, and needs further education     HOME EXERCISE PROGRAM: Access Code: [email or web address] URL: [email or web address] Date: 04/01/2022 Prepared by: Gwendolyn Grant   Exercises - Standing March with Counter Support  - 2 x daily - 7 x weekly - 2 sets - 10 reps - Supine Heel Slide  - 2 x daily - 7 x weekly - 1 sets - 10 reps - 5 sec  hold - Supine Quad Set  - 2 x daily - 7 x weekly - 2 sets - 10 reps - 5 sec  hold - Seated Knee Extension AAROM  - 2 x daily - 7 x weekly - 1 sets - 10 reps - 5 sec  hold   ASSESSMENT:   CLINICAL IMPRESSION: Pt responded well to early interventions today, demonstrating good form and no pain with selected exercises. She  tolerated early closed-chain exercises well and will continue to benefit from skilled PT to address her primary impairments and return to her prior level of function with less limitation.        OBJECTIVE IMPAIRMENTS Abnormal gait, decreased activity tolerance, decreased balance, difficulty walking, decreased ROM, decreased strength, improper body mechanics, postural dysfunction, and pain.    ACTIVITY LIMITATIONS carrying, lifting, bending, standing, squatting, stairs, transfers, and locomotion level   PARTICIPATION LIMITATIONS: meal prep, cleaning, laundry, shopping, and community activity   PERSONAL FACTORS Age, Fitness, Time since onset of injury/illness/exacerbation, and 3+ comorbidities: see PMH above  are also affecting patient's functional outcome.        GOALS: Goals reviewed with patient? Yes   SHORT TERM GOALS: Target date: 04/23/22 Patient will be independent and compliant with initial HEP.    Baseline: issued at eval  Goal status: INITIAL   2.  Patient will demonstrate at least 120 degrees of Lt knee flexion AROM to improve gait mechanics.  Baseline: see above  Goal status: INITIAL   3.  Patient will complete TUG in </= 18 seconds to signify a reduction in fall risk.  Baseline: see above  Goal status: INITIAL     LONG TERM GOALS: Target date: 05/14/22  Patient will demonstrate at least 4/5 Lt knee extension strength to improve ability to complete stair and curb negotiation.  Baseline: see above  Goal status: INITIAL   2.  Patient will report pain at worst rated as </=5/10 to reduce her current functional limitations.  Baseline: 8/10  Goal status: INITIAL   3.  Patient will be able to participate in community activity independently.  Baseline: unable to attend community events independently at this time due to pain.  Goal status: INITIAL   4.  Patient will score at least 52% on FOTO to signify clinically meaningful improvement in functional abilities.     Baseline: 37 Goal status: INITIAL     PLAN: PT FREQUENCY: 2x/week   PT DURATION: 6 weeks   PLANNED INTERVENTIONS: Therapeutic exercises, Therapeutic activity, Neuromuscular re-education, Balance training, Gait training, Patient/Family education, Self Care, Joint mobilization, Stair training, Aquatic Therapy, Dry Needling, Cryotherapy, Moist heat, Vasopneumatic device, Manual therapy, and Re-evaluation   PLAN FOR NEXT SESSION: review and progress HEP prn; manual to knee; focus on restoring flexion, hip flexor stretching, quad strengthening.    Vanessa [place], PT, DPT 04/07/22 4:11 PM

## 2022-04-09 ENCOUNTER — Ambulatory Visit: Payer: Medicare HMO

## 2022-04-09 DIAGNOSIS — M25562 Pain in left knee: Secondary | ICD-10-CM | POA: Diagnosis not present

## 2022-04-09 DIAGNOSIS — M6281 Muscle weakness (generalized): Secondary | ICD-10-CM

## 2022-04-09 DIAGNOSIS — R2689 Other abnormalities of gait and mobility: Secondary | ICD-10-CM

## 2022-04-09 DIAGNOSIS — G8929 Other chronic pain: Secondary | ICD-10-CM

## 2022-04-09 NOTE — Therapy (Signed)
OUTPATIENT PHYSICAL THERAPY TREATMENT NOTE   Patient Name: ARIAM MOL MRN: [id number] DOB:1940/11/10, 81 y.o., female Today's Date: 04/09/2022  PCP: [name] REFERRING PROVIDER: Frederik Pear, MD  END OF SESSION:   PT End of Session - 04/09/22 1004     Visit Number 3    Number of Visits 13    Date for PT Re-Evaluation 05/15/22    Authorization Type [employer] MCR    Authorization Time Period FOTO V6, V10, kx mod v15    Progress Note Due on Visit 10    PT Start Time 1004    PT Stop Time 1043    PT Time Calculation (min) 39 min    Equipment Utilized During Treatment Gait belt    Activity Tolerance Patient tolerated treatment well    Behavior During Therapy WFL for tasks assessed/performed              Past Medical History:  Diagnosis Date   Anemia    PMH;  Patient denies this dx as of 06/03/21   Anxiety    Asthma    Breast cancer ([place]) 1988   left breast cancer   Collagenous colitis    Compression fracture    lumbar 1   Dyspnea    with exertion   Hypertension    Hypothyroidism    Osteoarthritis    Osteopenia    Pneumonia 07/2020   PONV (postoperative nausea and vomiting)    Sleep apnea    wears CPAP nightly   Past Surgical History:  Procedure Laterality Date   BREAST REDUCTION SURGERY     Right   CATARACT EXTRACTION W/ INTRAOCULAR LENS  IMPLANT, BILATERAL     CESAREAN SECTION     COLONOSCOPY W/ BIOPSIES AND POLYPECTOMY     CYST EXCISION N/A 08/30/2018   Procedure: excision of posterior shoulder / back sebaceous cyst;  Surgeon: Wallace Going, DO;  Location: [facility];  Service: Plastics;  Laterality: N/A;   KNEE ARTHROSCOPY Left 02/23/2022   Procedure: LEFT KNEE ARTHROSCOPY;  Surgeon: Melrose Nakayama, MD;  Location: [facility];  Service: Orthopedics;  Laterality: Left;   KYPHOPLASTY N/A 02/18/2016   Procedure: LUMBAR 1 KYPHOPLASTY;  Surgeon: Phylliss Bob, MD;  Location: [facility];  Service: Orthopedics;  Laterality: N/A;   LUMBAR 1 KYPHOPLASTY   KYPHOPLASTY N/A 06/04/2021   Procedure: LUMBAR 2 KYPHOPLASTY;  Surgeon: Phylliss Bob, MD;  Location: [place];  Service: Orthopedics;  Laterality: N/A;   LAPAROSCOPIC APPENDECTOMY N/A 12/09/2021   Procedure: APPENDECTOMY LAPAROSCOPIC;  Surgeon: Ileana Roup, MD;  Location: [facility];  Service: General;  Laterality: N/A;   LUMBAR DISC SURGERY     MODIFIED RADICAL MASTECTOMY W/ AXILLARY LYMPH NODE DISSECTION     Left   RECONSTRUCTION BREAST W/ LATISSIMUS DORSI FLAP     left   REDUCTION MAMMAPLASTY Right    right foot surgery     corrected hammer toe, straightened big toe   SEPTOPLASTY     TOE SURGERY     TONSILLECTOMY AND ADENOIDECTOMY     Patient Active Problem List   Diagnosis Date Noted   Acute appendicitis 12/09/2021   S/P laparoscopic appendectomy 12/09/2021   CAP (community acquired pneumonia) 09/30/2020   Chronic respiratory failure with hypoxia ([place]) 09/23/2020   Allergic rhinitis 08/31/2019   Pilar cyst 08/04/2018   History of breast cancer in female 06/09/2018   History of reconstruction of both breasts 06/09/2018   Severe obesity (BMI >= 40) ([place]) 09/16/2015  Asthma with acute exacerbation 09/15/2015   DOE (dyspnea on exertion) 01/11/2014   Insomnia 05/05/2012   Osteoarthritis    Osteopenia    Collagenous colitis    OSA (obstructive sleep apnea)    Hypothyroidism    Mild persistent asthma    Hypertension    Breast cancer, left breast ([facility])     REFERRING DIAG: s/p left knee scope  THERAPY DIAG:  Chronic pain of left knee  Muscle weakness (generalized)  Other abnormalities of gait and mobility  Rationale for Evaluation and Treatment Rehabilitation  PERTINENT HISTORY: Lt knee partial lateral meniscectomy on 02/23/22, breast cx, anxiety, asthma, compression fx, osteopenia  PRECAUTIONS: Falls  SUBJECTIVE: Pt reports 3/10 pain currently. She reports increased walking yesterday, to which she responded well.  She also reports a  positive response to her first treatment session.  PAIN:  Are you having pain? Currently 3/10 Yes: NPRS scale: 8 (at worst)/10 Pain location: anterior Lt knee Pain description: sharp Aggravating factors: prolonged standing/walking, activity in general Relieving factors: rest with pillow under the knee   OBJECTIVE: (objective measures completed at initial evaluation unless otherwise dated)   DIAGNOSTIC FINDINGS:  Lt knee MRI: IMPRESSION: 1. Advanced tricompartmental osteoarthritis, most severe within the lateral compartment. 2. Advanced degeneration and tearing throughout the lateral meniscus. 3. Small joint effusion with multiple intra-articular loose bodies.   PATIENT SURVEYS:  FOTO 37% function to 52% predicted   COGNITION:           Overall cognitive status: Within functional limits for tasks assessed                          SENSATION: Not tested   EDEMA:  No obvious swelling about the Lt knee      POSTURE: rounded shoulders and forward head   PALPATION: TTP lateral joint line    LOWER EXTREMITY ROM:   Active ROM Right eval Left eval Right 04/09/2022 Left 04/09/2022  Hip flexion        Hip extension        Hip abduction        Hip adduction        Hip internal rotation        Hip external rotation        Knee flexion 120 109 113 95, 102 following  AAROM  Knee extension Unable to accurately assess due to hip flexor tightness  Unable to accurately assess due to hip flexor tightness  -7 -20  Ankle dorsiflexion        Ankle plantarflexion        Ankle inversion        Ankle eversion         (Blank rows = not tested)   LOWER EXTREMITY MMT:   MMT Right eval Left eval  Hip flexion 4+ 4  Hip extension      Hip abduction      Hip adduction      Hip internal rotation      Hip external rotation      Knee flexion 5 4+  Knee extension 5 3-  Ankle dorsiflexion      Ankle plantarflexion      Ankle inversion      Ankle eversion       (Blank rows = not  tested)   LOWER EXTREMITY SPECIAL TESTS:  None    FUNCTIONAL TESTS:  5 x STS 14.65 seconds TUG 23.8 seconds    GAIT:  Distance walked: 10 ft  Assistive device utilized: Single point cane Level of assistance: Modified independence Comments: limited knee flexion during stance and swing; WBOS       TODAY'S TREATMENT:  [facility] Adult PT Treatment:                                                DATE: 04/09/2022 Therapeutic Exercise: Supine Lt knee AAROM with green strap 3x10 with 5-sec hold, PT assistance PRN Supine Lt hamstring stretch with strap 2x99mn Standing squat with UE support 2x10 Standing high marching with UE support 2x10 Seated clamshells with blue band 2x10 Manual Therapy: N/A Neuromuscular re-ed: N/A Therapeutic Activity: N/A Modalities: N/A Self Care: N/A   [facility] Adult PT Treatment:                                                DATE: 04/07/2022 Therapeutic Exercise: Mini squat with UE support 3x10 Seated BIL knee extension machine from 90d flexion to 45d flexion x12 with 15#, x12 with 10#, x12 with 5# Standing Lt hamstring curl with 7# cable to ankle 3x10 Seated clamshells with blue band around thighs 3x10 Seated marching with blue band around thighs 3x10 BIL Manual Therapy: N/A Neuromuscular re-ed: N/A Therapeutic Activity: N/A Modalities: N/A Self Care: N/A   [facility]                                                [facility]: [facility] Therapeutic Exercise: Demonstrated and issue initial HEP.    Therapeutic Activity: Education on assessment findings that will be addressed throughout duration of POC.          PATIENT EDUCATION:  Education details: see treatment  Person educated: Patient Education method: Explanation, Demonstration, Tactile cues, Verbal cues, and Handouts Education comprehension: verbalized understanding, returned demonstration, verbal cues required, tactile cues required, and needs further education     HOME  EXERCISE PROGRAM: Access Code: [email or web address]: [email or web address] Date: 04/01/2022 Prepared by: SGwendolyn Grant  Exercises - Standing March with Counter Support  - 2 x daily - 7 x weekly - 2 sets - 10 reps - Supine Heel Slide  - 2 x daily - 7 x weekly - 1 sets - 10 reps - 5 sec  hold - Supine Quad Set  - 2 x daily - 7 x weekly - 2 sets - 10 reps - 5 sec  hold - Seated Knee Extension AAROM  - 2 x daily - 7 x weekly - 1 sets - 10 reps - 5 sec  hold   ASSESSMENT:   CLINICAL IMPRESSION: Pt responded excellently to all interventions today, demonstrating good form with mild increase in pain with exercises. She demonstrates an in-session improvement of knee AROM of 7 degrees following AAROM with strap. Pt will continue to benefit from skilled PT to address her primary impairments and return to her prior level of function with less limitation.     OBJECTIVE IMPAIRMENTS Abnormal gait, decreased activity tolerance, decreased balance, difficulty walking, decreased ROM, decreased strength, improper body mechanics, postural dysfunction, and pain.  ACTIVITY LIMITATIONS carrying, lifting, bending, standing, squatting, stairs, transfers, and locomotion level   PARTICIPATION LIMITATIONS: meal prep, cleaning, laundry, shopping, and community activity   PERSONAL FACTORS Age, Fitness, Time since onset of injury/illness/exacerbation, and 3+ comorbidities: see PMH above  are also affecting patient's functional outcome.        GOALS: Goals reviewed with patient? Yes   SHORT TERM GOALS: Target date: 04/23/22 Patient will be independent and compliant with initial HEP.    Baseline: issued at eval  Goal status: INITIAL   2.  Patient will demonstrate at least 120 degrees of Lt knee flexion AROM to improve gait mechanics.  Baseline: see above  Goal status: INITIAL   3.  Patient will complete TUG in </= 18 seconds to signify a reduction in fall risk.  Baseline: see above  Goal status:  INITIAL     LONG TERM GOALS: Target date: 05/14/22   Patient will demonstrate at least 4/5 Lt knee extension strength to improve ability to complete stair and curb negotiation.  Baseline: see above  Goal status: INITIAL   2.  Patient will report pain at worst rated as </=5/10 to reduce her current functional limitations.  Baseline: 8/10  Goal status: INITIAL   3.  Patient will be able to participate in community activity independently.  Baseline: unable to attend community events independently at this time due to pain.  Goal status: INITIAL   4.  Patient will score at least 52% on FOTO to signify clinically meaningful improvement in functional abilities.    Baseline: 37 Goal status: INITIAL     PLAN: PT FREQUENCY: 2x/week   PT DURATION: 6 weeks   PLANNED INTERVENTIONS: Therapeutic exercises, Therapeutic activity, Neuromuscular re-education, Balance training, Gait training, Patient/Family education, Self Care, Joint mobilization, Stair training, Aquatic Therapy, Dry Needling, Cryotherapy, Moist heat, Vasopneumatic device, Manual therapy, and Re-evaluation   PLAN FOR NEXT SESSION: review and progress HEP prn; manual to knee; focus on restoring flexion, hip flexor stretching, quad strengthening.    Vanessa Atwater, PT, DPT 04/09/22 10:44 AM

## 2022-04-12 ENCOUNTER — Ambulatory Visit: Payer: Medicare HMO

## 2022-04-12 VITALS — BP 143/60 | HR 78 | Temp 97.9°F | Resp 16 | Ht 61.0 in | Wt 239.8 lb

## 2022-04-12 DIAGNOSIS — J4531 Mild persistent asthma with (acute) exacerbation: Secondary | ICD-10-CM

## 2022-04-12 DIAGNOSIS — I5032 Chronic diastolic (congestive) heart failure: Secondary | ICD-10-CM

## 2022-04-12 DIAGNOSIS — I10 Essential (primary) hypertension: Secondary | ICD-10-CM

## 2022-04-12 MED ORDER — POTASSIUM CHLORIDE CRYS ER 20 MEQ PO TBCR: 20.0000 meq | EXTENDED_RELEASE_TABLET | Freq: Three times a day (TID) | ORAL | 3 refills | Status: DC

## 2022-04-12 NOTE — Progress Notes (Signed)
Primary Physician/Referring:  [name]  Patient ID: [name], female    DOB: [birth year], 81 y.o.   MRN: [id number]  No chief complaint on file.  HPI:    [name]  is a 81 y.o. Caucasian female  with hypothyroidism, hypertension, depression/anxiety, OSA on CPAP, history of left breast cancer, hypocalcemia, CKD stage 3. She did not tolerate Bisoprolol,  labetalol due to worsening asthma and Aldactone caused worsening renal function.   Patient presents for 6 months follow up requesting medication refills.  She has maintained weight loss.  She has difficulty exercising due to knee pain.  She is participating in physical therapy.  Her goal is to walk unaided.  Currently she is using a cane and a walker at home occasionally.  Denies chest pain, increased shortness of breath, palpitations, or dizziness.  Overall she is stable from a cardiac standpoint.  Past Medical History:  Diagnosis Date   Anemia    PMH;  Patient denies this dx as of 06/03/21   Anxiety    Asthma    Breast cancer ([place]) 1988   left breast cancer   Collagenous colitis    Compression fracture    lumbar 1   Dyspnea    with exertion   Hypertension    Hypothyroidism    Osteoarthritis    Osteopenia    Pneumonia 07/2020   PONV (postoperative nausea and vomiting)    Sleep apnea    wears CPAP nightly   Past Surgical History:  Procedure Laterality Date   BREAST REDUCTION SURGERY     Right   CATARACT EXTRACTION W/ INTRAOCULAR LENS  IMPLANT, BILATERAL     CESAREAN SECTION     COLONOSCOPY W/ BIOPSIES AND POLYPECTOMY     CYST EXCISION N/A 08/30/2018   Procedure: excision of posterior shoulder / back sebaceous cyst;  Surgeon: Wallace Going, DO;  Location: [facility];  Service: Plastics;  Laterality: N/A;   KNEE ARTHROSCOPY Left 02/23/2022   Procedure: LEFT KNEE ARTHROSCOPY;  Surgeon: Melrose Nakayama, MD;  Location: [facility];  Service: Orthopedics;  Laterality: Left;    KYPHOPLASTY N/A 02/18/2016   Procedure: LUMBAR 1 KYPHOPLASTY;  Surgeon: Phylliss Bob, MD;  Location: [facility];  Service: Orthopedics;  Laterality: N/A;  LUMBAR 1 KYPHOPLASTY   KYPHOPLASTY N/A 06/04/2021   Procedure: LUMBAR 2 KYPHOPLASTY;  Surgeon: Phylliss Bob, MD;  Location: [facility];  Service: Orthopedics;  Laterality: N/A;   LAPAROSCOPIC APPENDECTOMY N/A 12/09/2021   Procedure: APPENDECTOMY LAPAROSCOPIC;  Surgeon: Ileana Roup, MD;  Location: [facility];  Service: General;  Laterality: N/A;   LUMBAR DISC SURGERY     MODIFIED RADICAL MASTECTOMY W/ AXILLARY LYMPH NODE DISSECTION     Left   RECONSTRUCTION BREAST W/ LATISSIMUS DORSI FLAP     left   REDUCTION MAMMAPLASTY Right    right foot surgery     corrected hammer toe, straightened big toe   SEPTOPLASTY     TOE SURGERY     TONSILLECTOMY AND ADENOIDECTOMY     Family History  Problem Relation Age of Onset   Lung cancer Mother    Hypertension Mother    Hypertension Father    Ovarian cancer Sister    Hypertension Brother    Angina Brother 46   Breast cancer Maternal Aunt    Brain cancer Maternal Aunt    Kidney cancer Paternal Uncle    Prostate cancer Paternal Uncle    Breast cancer Other  cousin   Ovarian cancer Other        cousin   Kidney cancer Other        cousin    Social History   Tobacco Use   Smoking status: Never   Smokeless tobacco: Never  Substance Use Topics   Alcohol use: Yes    Alcohol/week: 1.0 standard drink of alcohol    Types: 1 Glasses of wine per week    Comment: wine in evenings but none since 04/2021   Marital Status: Married  ROS  Review of Systems  Constitutional: Negative for malaise/fatigue and weight gain.  Cardiovascular:  Positive for dyspnea on exertion (chronic, stable). Negative for chest pain, claudication, near-syncope, orthopnea, palpitations, paroxysmal nocturnal dyspnea and syncope. Leg swelling: mild, intermittent. Neurological:  Negative for dizziness.   Objective   Blood pressure (!) 143/60, pulse 78, temperature 97.9 F (36.6 C), temperature source Temporal, resp. rate 16, height 5' 1" (1.549 m), weight 239 lb 12.8 oz (108.8 kg).     04/12/2022    1:10 PM 02/23/2022    5:48 PM 02/23/2022    5:15 PM  Vitals with BMI  Height 5' 1"    Weight 239 lbs 13 oz    BMI 32.35    Systolic 573 220 254  Diastolic 60 72 73  Pulse 78  47     Physical Exam Vitals reviewed.  Constitutional:      General: She is not in acute distress.    Appearance: She is well-developed. She is not ill-appearing.  Cardiovascular:     Rate and Rhythm: Normal rate and regular rhythm.     Pulses: Normal pulses and intact distal pulses.          Carotid pulses are 2+ on the right side and 2+ on the left side.      Dorsalis pedis pulses are 2+ on the right side and 2+ on the left side.       Posterior tibial pulses are 2+ on the right side and 2+ on the left side.     Heart sounds: S1 normal and S2 normal. No murmur heard.    No gallop.     Comments: No JVD. Pulmonary:     Effort: Pulmonary effort is normal. No accessory muscle usage or respiratory distress.     Breath sounds: No decreased breath sounds, wheezing, rhonchi or rales.  Musculoskeletal:     Right lower leg: No edema.     Left lower leg: Edema (trace) present.  Neurological:     Mental Status: She is alert.    Laboratory examination:   Recent Labs    06/04/21 1035 12/08/21 2008 12/10/21 0126  NA 137 141 138  K 3.7 3.1* 3.6  CL 104 104 107  CO2 _0 GLUCOSE 100* 119* 146*  BUN _1 CREATININE 0.80 0.88 0.94  CALCIUM 9.0 9.9 8.5*  GFRNONAA >60 >60 >60   CrCl cannot be calculated (Patient's most recent lab result is older than the maximum 21 days allowed.).     Latest Ref Rng & Units 12/10/2021    1:26 AM 12/08/2021    8:08 PM 06/04/2021   10:35 AM  CMP  Glucose 70 - 99 mg/dL 146  119  100   BUN 8 - 23 mg/dL _2 Creatinine 0.44 - 1.00 mg/dL 0.94  0.88  0.80   Sodium 135 - 145  mmol/L 138  141  137  Potassium 3.5 - 5.1 mmol/L 3.6  3.1  3.7   Chloride 98 - 111 mmol/L 107  104  104   CO2 22 - 32 mmol/L 23  25  25   Calcium 8.9 - 10.3 mg/dL 8.5  9.9  9.0   Total Protein 6.5 - 8.1 g/dL  7.1  6.1   Total Bilirubin 0.3 - 1.2 mg/dL  0.5  0.9   Alkaline Phos 38 - 126 U/L  73  81   AST 15 - 41 U/L  19  23   ALT 0 - 44 U/L  16  24       Latest Ref Rng & Units 12/10/2021    1:26 AM 12/08/2021    8:08 PM 06/04/2021   10:35 AM  CBC  WBC 4.0 - 10.5 K/uL 9.3  9.9  4.9   Hemoglobin 12.0 - 15.0 g/dL 11.2  12.8  13.1   Hematocrit 36.0 - 46.0 % 34.9  40.1  39.7   Platelets 150 - 400 K/uL 160  196  179    Lipid Panel  No results found for: "CHOL", "TRIG", "HDL", "CHOLHDL", "VLDL", "LDLCALC", "LDLDIRECT" HEMOGLOBIN A1C No results found for: "HGBA1C", "MPG" TSH No results for input(s): "TSH" in the last 8760 hours.  BNP (last 3 results) No results for input(s): "BNP" in the last 8760 hours.   External labs: 04/27/2021:  Glucose 70 - 99 mg/dL 89   BUN 8 - 27 mg/dL 14   Creatinine 0.57 - 1.00 mg/dL 0.75   eGFR >59 mL/min/1.73 82   BUN/Creatinine Ratio 12 - 28 19   Sodium 134 - 144 mmol/L 142   Potassium 3.5 - 5.2 mmol/L 4.1   Chloride 96 - 106 mmol/L 105   CO2 20 - 29 mmol/L 22   CALCIUM 8.7 - 10.3 mg/dL 10.0   Total Protein 6.0 - 8.5 g/dL 6.4   Albumin, Serum 3.7 - 4.7 g/dL 4.3   Globulin, Total 1.5 - 4.5 g/dL 2.1   Albumin/Globulin Ratio 1.2 - 2.2 2.0   Total Bilirubin 0.0 - 1.2 mg/dL 0.3   Alkaline Phosphatase 44 - 121 IU/L 82   AST 0 - 40 IU/L 22   ALT (SGPT) 0 - 32 IU/L 19    Cholesterol, Total 100 - 199 mg/dL 208 High     Triglycerides 0 - 149 mg/dL 91    HDL >39 mg/dL 107    VLDL Cholesterol Cal 5 - 40 mg/dL 16    LDL 0 - 99 mg/dL 85    TSH 3.88   Labs 04/01/2020:  A1c 5.1%.  TSH normal.  Hb 12.6/HCT 38.4, mild macrocytosis present, platelets 172.  Serum glucose 94 mg, BUN 17, creatinine 0.86, EGFR 66 mL, sodium 142, potassium 3.6, CMP  otherwise normal.  Total cholesterol 217, triglycerides 68, HDL 118, LDL 87. 03/21/2019: BUN 22 creatinine 0.97, EGFR 57 mL, sodium 144, potassium 3.9. Total cholesterol 185, triglycerides 126, HDL 87, LDL 77.  Allergies   Allergies  Allergen Reactions   Nebivolol Hcl Other (See Comments)    Hypotensive episode   Bactrim [Sulfamethoxazole-Trimethoprim] Other (See Comments)    UNSPECIFIED    Dairy Aid [Tilactase] Diarrhea and Other (See Comments)    Ice cream, sour cream   Lactose Intolerance (Gi) Diarrhea   Microplegia Msa-Msg [Cardioplegia [place] Formula] Other (See Comments)    Unknown reaction    Adhesive [Tape] Rash   Meloxicam Nausea Only   Other Nausea And Vomiting    general anesthesia     Sulfa Antibiotics Rash         Medications Prior to Visit:   Outpatient Medications Prior to Visit  Medication Sig Dispense Refill   ADVAIR HFA 115-21 MCG/ACT inhaler USE 2 INHALATIONS TWICE A DAY 36 g 3   albuterol (PROVENTIL) (2.5 MG/3ML) 0.083% nebulizer solution Take 3 mLs (2.5 mg dose) by nebulization every 6 (six) hours as needed for Wheezing. 360 mL 0   albuterol (VENTOLIN HFA) 108 (90 Base) MCG/ACT inhaler USE 2 INHALATIONS EVERY 6 HOURS AS NEEDED FOR WHEEZING OR SHORTNESS OF BREATH 51 g 3   B Complex-C (B-COMPLEX WITH VITAMIN C) tablet Take 1 tablet by mouth in the morning.     Calcium Carb-Cholecalciferol (CALTRATE 600+D3 PO) Take 1 tablet by mouth in the morning and at bedtime.     clobetasol ointment (TEMOVATE) 0.05 % Apply 1 application topically 2 (two) times daily as needed (irritation).     denosumab (PROLIA) 60 MG/ML SOSY injection Inject 60 mg into the skin every 6 (six) months.     diclofenac sodium (VOLTAREN) 1 % GEL Apply 1 application  topically 4 (four) times daily as needed (pain.).     diltiazem (CARDIZEM CD) 240 MG 24 hr capsule Take 240 mg by mouth in the morning.     diphenhydrAMINE (BENADRYL) 50 MG tablet Take 100 mg by mouth in the morning, at noon, and at  bedtime.     diphenhydrAMINE HCl, Sleep, (ZZZQUIL PO) Take 3 tablets by mouth at bedtime.     furosemide (LASIX) 40 MG tablet Take 40 mg by mouth daily.     hydrALAZINE (APRESOLINE) 50 MG tablet TAKE 1 TABLET THREE TIMES A DAY 270 tablet 3   levothyroxine (SYNTHROID, LEVOTHROID) 88 MCG tablet Take 88 mcg by mouth daily before breakfast.     losartan (COZAAR) 100 MG tablet Take 100 mg by mouth in the morning.     magnesium oxide (MAG-OX) 400 (240 Mg) MG tablet Take 400 mg by mouth daily as needed (constipation).     Multiple Vitamins-Minerals (MULTIVITAMIN WITH MINERALS) tablet Take 1 tablet by mouth daily at 12 noon. Centrum Silver for Women     naproxen sodium (ALEVE) 220 MG tablet Take 440 mg by mouth 2 (two) times daily with a meal.     NON FORMULARY Pt uses a cpap nightly     ORTHOVISC 30 MG/2ML SOSY Inject 6 mLs into the skin every 6 (six) months.     Polyethyl Glycol-Propyl Glycol (SYSTANE) 0.4-0.3 % SOLN Place 1-2 drops into both eyes in the morning and at bedtime.     sertraline (ZOLOFT) 100 MG tablet Take 100 mg by mouth 2 (two) times daily.      potassium chloride SA (KLOR-CON M20) 20 MEQ tablet Take 1 tablet (20 mEq total) by mouth 3 (three) times daily. 90 tablet 0   KRILL OIL PO Take 1 capsule by mouth daily.     methocarbamol (ROBAXIN) 500 MG tablet Take 500 mg by mouth daily.     HYDROcodone-acetaminophen (NORCO/VICODIN) 5-325 MG tablet Take 1-2 tablets by mouth every 6 (six) hours as needed for moderate pain or severe pain (post op pain). Take 2 tablets by mouth in the morning and evening and take 1 tablet at bedtime 30 tablet 0   No facility-administered medications prior to visit.   Final Medications at End of Visit    Current Meds  Medication Sig   ADVAIR HFA 115-21 MCG/ACT inhaler USE 2 INHALATIONS TWICE A DAY     albuterol (PROVENTIL) (2.5 MG/3ML) 0.083% nebulizer solution Take 3 mLs (2.5 mg dose) by nebulization every 6 (six) hours as needed for Wheezing.   albuterol  (VENTOLIN HFA) 108 (90 Base) MCG/ACT inhaler USE 2 INHALATIONS EVERY 6 HOURS AS NEEDED FOR WHEEZING OR SHORTNESS OF BREATH   B Complex-C (B-COMPLEX WITH VITAMIN C) tablet Take 1 tablet by mouth in the morning.   Calcium Carb-Cholecalciferol (CALTRATE 600+D3 PO) Take 1 tablet by mouth in the morning and at bedtime.   clobetasol ointment (TEMOVATE) 9.03 % Apply 1 application topically 2 (two) times daily as needed (irritation).   denosumab (PROLIA) 60 MG/ML SOSY injection Inject 60 mg into the skin every 6 (six) months.   diclofenac sodium (VOLTAREN) 1 % GEL Apply 1 application  topically 4 (four) times daily as needed (pain.).   diltiazem (CARDIZEM CD) 240 MG 24 hr capsule Take 240 mg by mouth in the morning.   diphenhydrAMINE (BENADRYL) 50 MG tablet Take 100 mg by mouth in the morning, at noon, and at bedtime.   diphenhydrAMINE HCl, Sleep, (ZZZQUIL PO) Take 3 tablets by mouth at bedtime.   furosemide (LASIX) 40 MG tablet Take 40 mg by mouth daily.   hydrALAZINE (APRESOLINE) 50 MG tablet TAKE 1 TABLET THREE TIMES A DAY   levothyroxine (SYNTHROID, LEVOTHROID) 88 MCG tablet Take 88 mcg by mouth daily before breakfast.   losartan (COZAAR) 100 MG tablet Take 100 mg by mouth in the morning.   magnesium oxide (MAG-OX) 400 (240 Mg) MG tablet Take 400 mg by mouth daily as needed (constipation).   Multiple Vitamins-Minerals (MULTIVITAMIN WITH MINERALS) tablet Take 1 tablet by mouth daily at 12 noon. Centrum Silver for Women   naproxen sodium (ALEVE) 220 MG tablet Take 440 mg by mouth 2 (two) times daily with a meal.   NON FORMULARY Pt uses a cpap nightly   ORTHOVISC 30 MG/2ML SOSY Inject 6 mLs into the skin every 6 (six) months.   Polyethyl Glycol-Propyl Glycol (SYSTANE) 0.4-0.3 % SOLN Place 1-2 drops into both eyes in the morning and at bedtime.   sertraline (ZOLOFT) 100 MG tablet Take 100 mg by mouth 2 (two) times daily.    [DISCONTINUED] potassium chloride SA (KLOR-CON M20) 20 MEQ tablet Take 1 tablet  (20 mEq total) by mouth 3 (three) times daily.    Medications Discontinued During This Encounter  Medication Reason   HYDROcodone-acetaminophen (NORCO/VICODIN) 5-325 MG tablet    potassium chloride SA (KLOR-CON M20) 20 MEQ tablet Reorder     Radiology:   No results found.  Chest x-ray 09/02/2020: Mild cardiomegaly without overt pulmonary edema. Patchy atelectasis at the bases. Trace right pleural effusion or thickening. No pneumothorax. Old right-sided rib deformities. Treated compression deformity at the thoracolumbar junction.   IMPRESSION: Mild cardiomegaly without overt edema. Patchy atelectasis at the bases with trace right pleural effusion or thickening.  Cardiac Studies:   Lexiscan Myoview stress test 01/17/2019: Lexiscan stress test was performed. Stress EKG is non-diagnostic, as this is pharmacological stress test. Pharmacological myocardial perfusion imaging is normal. LVEF 69%. Low risk study.   Echocardiogram 06/22/2021:    Normal LV systolic function with EF 57%. Left ventricle cavity is normal in size. Moderate concentric remodeling of the left ventricle. Normal global wall motion. Doppler evidence of grade I (impaired) diastolic dysfunction, elevated LAP. E/e' 15.1 with E/A 0.8 and DT 232. Calculated EF 57%.  Left atrial cavity is mildly dilated.  Trace mitral regurgitation. Mild calcification of the mitral valve annulus. No evidence of  mitral valve stenosis.  No significant change from 12/07/2019.    EKG:  EKG 04/12/2022: Sinus rhythm at rate of 61 bpm.  Left axis.  Left anterior fascicular block. Poor  r wave progression. Low voltage complexes.  Compared to previous EKG on 07/01/21, no significant change  Assessment     ICD-10-CM   1. Essential hypertension  I10 EKG 12-Lead    2. Chronic diastolic heart failure (HCC)  I50.32     3. Mild persistent asthma with (acute) exacerbation  J45.31       Meds ordered this encounter  Medications   potassium  chloride SA (KLOR-CON M20) 20 MEQ tablet    Sig: Take 1 tablet (20 mEq total) by mouth 3 (three) times daily.    Dispense:  90 tablet    Refill:  3    Patient needs appointment for additional refills.    Order Specific Question:   Supervising Provider    Answer:   Adrian Prows [[id number]]   Medications Discontinued During This Encounter  Medication Reason   HYDROcodone-acetaminophen (NORCO/VICODIN) 5-325 MG tablet    potassium chloride SA (KLOR-CON M20) 20 MEQ tablet Reorder      Orders Placed This Encounter  Procedures   EKG 12-Lead      Recommendations:   Essential hypertension Pressure is well-controlled.  Goal BP less than 140/80. She does monitor her blood pressure at home and readings are 130s over 80s. Discussed healthy diet and exercise as tolerated and continuing weight loss efforts. Continue current medications without changes.  Chronic diastolic heart failure (HCC) No evidence of acute heart failure on physical examination.  Mild persistent asthma with (acute) exacerbation She does have chronic but stable dyspnea on exertion.   She is followed by pulmonology.  She is stable from cardiac standpoint.  Refill potassium as requested.  No changes to medications at this time.  We will follow-up in 1 year or sooner if needed.   Ernst Spell, AGNP-C 04/12/2022, 2:11 PM Office: [phone]

## 2022-04-15 ENCOUNTER — Ambulatory Visit: Payer: Medicare HMO

## 2022-04-15 DIAGNOSIS — M6281 Muscle weakness (generalized): Secondary | ICD-10-CM

## 2022-04-15 DIAGNOSIS — R2689 Other abnormalities of gait and mobility: Secondary | ICD-10-CM

## 2022-04-15 DIAGNOSIS — G8929 Other chronic pain: Secondary | ICD-10-CM

## 2022-04-15 DIAGNOSIS — M25562 Pain in left knee: Secondary | ICD-10-CM | POA: Diagnosis not present

## 2022-04-15 NOTE — Therapy (Signed)
OUTPATIENT PHYSICAL THERAPY TREATMENT NOTE   Patient Name: [name] MRN: [id number] DOB:Mar 02, 1941, 81 y.o., female Today's Date: 04/15/2022  PCP: [name] REFERRING PROVIDER: Frederik Pear, MD  END OF SESSION:   PT End of Session - 04/15/22 1000     Visit Number 4    Number of Visits 13    Date for PT Re-Evaluation 05/15/22    Authorization Type [employer] MCR    Authorization Time Period FOTO V6, V10, kx mod v15    Progress Note Due on Visit 10    PT Start Time 1001    PT Stop Time 1041    PT Time Calculation (min) 40 min    Equipment Utilized During Treatment Gait belt    Activity Tolerance Patient tolerated treatment well    Behavior During Therapy WFL for tasks assessed/performed               Past Medical History:  Diagnosis Date   Anemia    PMH;  Patient denies this dx as of 06/03/21   Anxiety    Asthma    Breast cancer ([place]) 1988   left breast cancer   Collagenous colitis    Compression fracture    lumbar 1   Dyspnea    with exertion   Hypertension    Hypothyroidism    Osteoarthritis    Osteopenia    Pneumonia 07/2020   PONV (postoperative nausea and vomiting)    Sleep apnea    wears CPAP nightly   Past Surgical History:  Procedure Laterality Date   BREAST REDUCTION SURGERY     Right   CATARACT EXTRACTION W/ INTRAOCULAR LENS  IMPLANT, BILATERAL     CESAREAN SECTION     COLONOSCOPY W/ BIOPSIES AND POLYPECTOMY     CYST EXCISION N/A 08/30/2018   Procedure: excision of posterior shoulder / back sebaceous cyst;  Surgeon: Wallace Going, DO;  Location: [place];  Service: Plastics;  Laterality: N/A;   KNEE ARTHROSCOPY Left 02/23/2022   Procedure: LEFT KNEE ARTHROSCOPY;  Surgeon: Melrose Nakayama, MD;  Location: [facility];  Service: Orthopedics;  Laterality: Left;   KYPHOPLASTY N/A 02/18/2016   Procedure: LUMBAR 1 KYPHOPLASTY;  Surgeon: Phylliss Bob, MD;  Location: [place];  Service: Orthopedics;  Laterality: N/A;   LUMBAR 1 KYPHOPLASTY   KYPHOPLASTY N/A 06/04/2021   Procedure: LUMBAR 2 KYPHOPLASTY;  Surgeon: Phylliss Bob, MD;  Location: [place];  Service: Orthopedics;  Laterality: N/A;   LAPAROSCOPIC APPENDECTOMY N/A 12/09/2021   Procedure: APPENDECTOMY LAPAROSCOPIC;  Surgeon: Ileana Roup, MD;  Location: [place];  Service: General;  Laterality: N/A;   LUMBAR DISC SURGERY     MODIFIED RADICAL MASTECTOMY W/ AXILLARY LYMPH NODE DISSECTION     Left   RECONSTRUCTION BREAST W/ LATISSIMUS DORSI FLAP     left   REDUCTION MAMMAPLASTY Right    right foot surgery     corrected hammer toe, straightened big toe   SEPTOPLASTY     TOE SURGERY     TONSILLECTOMY AND ADENOIDECTOMY     Patient Active Problem List   Diagnosis Date Noted   Acute appendicitis 12/09/2021   S/P laparoscopic appendectomy 12/09/2021   CAP (community acquired pneumonia) 09/30/2020   Chronic respiratory failure with hypoxia ([place]) 09/23/2020   Allergic rhinitis 08/31/2019   Pilar cyst 08/04/2018   History of breast cancer in female 06/09/2018   History of reconstruction of both breasts 06/09/2018   Severe obesity (BMI >= 40) ([place])  09/16/2015   Asthma with acute exacerbation 09/15/2015   DOE (dyspnea on exertion) 01/11/2014   Insomnia 05/05/2012   Osteoarthritis    Osteopenia    Collagenous colitis    OSA (obstructive sleep apnea)    Hypothyroidism    Mild persistent asthma    Hypertension    Breast cancer, left breast ([facility])     REFERRING DIAG: s/p left knee scope  THERAPY DIAG:  Chronic pain of left knee  Muscle weakness (generalized)  Other abnormalities of gait and mobility  Rationale for Evaluation and Treatment Rehabilitation  PERTINENT HISTORY: Lt knee partial lateral meniscectomy on 02/23/22, breast cx, anxiety, asthma, compression fx, osteopenia  PRECAUTIONS: Falls  SUBJECTIVE: Pt reports she can tell improvement in her knee pain today. She reports adherence to her HEP.  PAIN:  Are you having pain?  Yes: NPRS scale: 1-2/10 Pain location: anterior Lt knee Pain description: sharp Aggravating factors: prolonged standing/walking, activity in general Relieving factors: rest with pillow under the knee   OBJECTIVE: (objective measures completed at initial evaluation unless otherwise dated)   DIAGNOSTIC FINDINGS:  Lt knee MRI: IMPRESSION: 1. Advanced tricompartmental osteoarthritis, most severe within the lateral compartment. 2. Advanced degeneration and tearing throughout the lateral meniscus. 3. Small joint effusion with multiple intra-articular loose bodies.   PATIENT SURVEYS:  FOTO 37% function to 52% predicted   COGNITION:           Overall cognitive status: Within functional limits for tasks assessed                          SENSATION: Not tested   EDEMA:  No obvious swelling about the Lt knee      POSTURE: rounded shoulders and forward head   PALPATION: TTP lateral joint line    LOWER EXTREMITY ROM:   Active ROM Right eval Left eval Right 04/09/2022 Left 04/09/2022 Left 04/15/2022  Hip flexion         Hip extension         Hip abduction         Hip adduction         Hip internal rotation         Hip external rotation         Knee flexion 120 109 113 95, 102 following  AAROM 101, 107 following AAROM  Knee extension Unable to accurately assess due to hip flexor tightness  Unable to accurately assess due to hip flexor tightness  -7 -20   Ankle dorsiflexion         Ankle plantarflexion         Ankle inversion         Ankle eversion          (Blank rows = not tested)   LOWER EXTREMITY MMT:   MMT Right eval Left eval  Hip flexion 4+ 4  Hip extension      Hip abduction      Hip adduction      Hip internal rotation      Hip external rotation      Knee flexion 5 4+  Knee extension 5 3-  Ankle dorsiflexion      Ankle plantarflexion      Ankle inversion      Ankle eversion       (Blank rows = not tested)   LOWER EXTREMITY SPECIAL TESTS:  None     FUNCTIONAL TESTS:  5 x STS 14.65 seconds TUG  23.8 seconds    GAIT: Distance walked: 10 ft  Assistive device utilized: Single point cane Level of assistance: Modified independence Comments: limited knee flexion during stance and swing; WBOS       TODAY'S TREATMENT:  [facility] Adult PT Treatment:                                                DATE: 04/15/2022 Therapeutic Exercise: Supine Lt knee AAROM with green strap 3x10 with 5-sec hold, PT assistance PRN Seated Lt active hamstring stretch with stool x4mn Seated hamstring curls with 25# from roughly 30d knee flexion to 80d knee flexion 3x10 Seated BIL knee extension machine with 10# and with pillow under thighs from roughly 90d knee flexion to 30d knee flexion 3x10 Mini squat with UE support 2x8 Standing mini marches 2x10 Manual Therapy: N/A Neuromuscular re-ed: N/A Therapeutic Activity: N/A Modalities: N/A Self Care: N/A   [facility] Adult PT Treatment:                                                DATE: 04/09/2022 Therapeutic Exercise: Supine Lt knee AAROM with green strap 3x10 with 5-sec hold, PT assistance PRN Supine Lt hamstring stretch with strap 2x186m Standing squat with UE support 2x10 Standing high marching with UE support 2x10 Seated clamshells with blue band 2x10 Manual Therapy: N/A Neuromuscular re-ed: N/A Therapeutic Activity: N/A Modalities: N/A Self Care: N/A   [facility] Adult PT Treatment:                                                DATE: 04/07/2022 Therapeutic Exercise: Mini squat with UE support 3x10 Seated BIL knee extension machine from 90d flexion to 45d flexion x12 with 15#, x12 with 10#, x12 with 5# Standing Lt hamstring curl with 7# cable to ankle 3x10 Seated clamshells with blue band around thighs 3x10 Seated marching with blue band around thighs 3x10 BIL Manual Therapy: N/A Neuromuscular re-ed: N/A Therapeutic Activity: N/A Modalities: N/A Self Care: N/A        PATIENT  EDUCATION:  Education details: see treatment  Person educated: Patient Education method: Explanation, Demonstration, Tactile cues, Verbal cues, and Handouts Education comprehension: verbalized understanding, returned demonstration, verbal cues required, tactile cues required, and needs further education     HOME EXERCISE PROGRAM: Access Code: [email or web address]: [email or web address] Date: 04/01/2022 Prepared by: SaGwendolyn Grant Exercises - Standing March with Counter Support  - 2 x daily - 7 x weekly - 2 sets - 10 reps - Supine Heel Slide  - 2 x daily - 7 x weekly - 1 sets - 10 reps - 5 sec  hold - Supine Quad Set  - 2 x daily - 7 x weekly - 2 sets - 10 reps - 5 sec  hold - Seated Knee Extension AAROM  - 2 x daily - 7 x weekly - 1 sets - 10 reps - 5 sec  hold   ASSESSMENT:   CLINICAL IMPRESSION: Pt responded well to all interventions today, demonstrating good form and no pain with selected exercises. She continues to demonstrate  inter-session and intra-session improvement in knee flexion AROM with AAROM with PT assistance. Pt will continue to benefit from skilled PT to address her primary impairments and return to her prior level of function with less limitation.     OBJECTIVE IMPAIRMENTS Abnormal gait, decreased activity tolerance, decreased balance, difficulty walking, decreased ROM, decreased strength, improper body mechanics, postural dysfunction, and pain.    ACTIVITY LIMITATIONS carrying, lifting, bending, standing, squatting, stairs, transfers, and locomotion level   PARTICIPATION LIMITATIONS: meal prep, cleaning, laundry, shopping, and community activity   PERSONAL FACTORS Age, Fitness, Time since onset of injury/illness/exacerbation, and 3+ comorbidities: see PMH above  are also affecting patient's functional outcome.        GOALS: Goals reviewed with patient? Yes   SHORT TERM GOALS: Target date: 04/23/22 Patient will be independent and compliant with initial  HEP.    Baseline: issued at eval  04/15/2022: Pt reports adherence to her HEP Goal status: ACHIEVED   2.  Patient will demonstrate at least 120 degrees of Lt knee flexion AROM to improve gait mechanics.  Baseline: see above  04/15/2022: 107d flexion following AAROM Goal status: IN PROGRESS   3.  Patient will complete TUG in </= 18 seconds to signify a reduction in fall risk.  Baseline: see above  Goal status: INITIAL     LONG TERM GOALS: Target date: 05/14/22   Patient will demonstrate at least 4/5 Lt knee extension strength to improve ability to complete stair and curb negotiation.  Baseline: see above  Goal status: INITIAL   2.  Patient will report pain at worst rated as </=5/10 to reduce her current functional limitations.  Baseline: 8/10  Goal status: INITIAL   3.  Patient will be able to participate in community activity independently.  Baseline: unable to attend community events independently at this time due to pain.  Goal status: INITIAL   4.  Patient will score at least 52% on FOTO to signify clinically meaningful improvement in functional abilities.    Baseline: 37 Goal status: INITIAL     PLAN: PT FREQUENCY: 2x/week   PT DURATION: 6 weeks   PLANNED INTERVENTIONS: Therapeutic exercises, Therapeutic activity, Neuromuscular re-education, Balance training, Gait training, Patient/Family education, Self Care, Joint mobilization, Stair training, Aquatic Therapy, Dry Needling, Cryotherapy, Moist heat, Vasopneumatic device, Manual therapy, and Re-evaluation   PLAN FOR NEXT SESSION: review and progress HEP prn; manual to knee; focus on restoring flexion, hip flexor stretching, quad strengthening.    Vanessa Glenn, PT, DPT 04/15/22 10:42 AM

## 2022-04-16 ENCOUNTER — Ambulatory Visit: Payer: Medicare HMO

## 2022-04-16 DIAGNOSIS — R2689 Other abnormalities of gait and mobility: Secondary | ICD-10-CM

## 2022-04-16 DIAGNOSIS — G8929 Other chronic pain: Secondary | ICD-10-CM

## 2022-04-16 DIAGNOSIS — M6281 Muscle weakness (generalized): Secondary | ICD-10-CM

## 2022-04-16 DIAGNOSIS — M25562 Pain in left knee: Secondary | ICD-10-CM | POA: Diagnosis not present

## 2022-04-16 NOTE — Therapy (Signed)
OUTPATIENT PHYSICAL THERAPY TREATMENT NOTE   Patient Name: [name] MRN: [id number] DOB:08-Sep-1940, 81 y.o., female Today's Date: 04/16/2022  PCP: [name] REFERRING PROVIDER: Frederik Pear, MD  END OF SESSION:   PT End of Session - 04/16/22 1002     Visit Number 5    Number of Visits 13    Date for PT Re-Evaluation 05/15/22    Authorization Type [employer] MCR    Authorization Time Period FOTO V6, V10, kx mod v15    Progress Note Due on Visit 10    PT Start Time 1002    PT Stop Time 1042    PT Time Calculation (min) 40 min    Activity Tolerance Patient tolerated treatment well    Behavior During Therapy WFL for tasks assessed/performed                Past Medical History:  Diagnosis Date   Anemia    PMH;  Patient denies this dx as of 06/03/21   Anxiety    Asthma    Breast cancer ([place]) 1988   left breast cancer   Collagenous colitis    Compression fracture    lumbar 1   Dyspnea    with exertion   Hypertension    Hypothyroidism    Osteoarthritis    Osteopenia    Pneumonia 07/2020   PONV (postoperative nausea and vomiting)    Sleep apnea    wears CPAP nightly   Past Surgical History:  Procedure Laterality Date   BREAST REDUCTION SURGERY     Right   CATARACT EXTRACTION W/ INTRAOCULAR LENS  IMPLANT, BILATERAL     CESAREAN SECTION     COLONOSCOPY W/ BIOPSIES AND POLYPECTOMY     CYST EXCISION N/A 08/30/2018   Procedure: excision of posterior shoulder / back sebaceous cyst;  Surgeon: Wallace Going, DO;  Location: [place];  Service: Plastics;  Laterality: N/A;   KNEE ARTHROSCOPY Left 02/23/2022   Procedure: LEFT KNEE ARTHROSCOPY;  Surgeon: Melrose Nakayama, MD;  Location: [facility];  Service: Orthopedics;  Laterality: Left;   KYPHOPLASTY N/A 02/18/2016   Procedure: LUMBAR 1 KYPHOPLASTY;  Surgeon: Phylliss Bob, MD;  Location: [place];  Service: Orthopedics;  Laterality: N/A;  LUMBAR 1 KYPHOPLASTY   KYPHOPLASTY N/A  06/04/2021   Procedure: LUMBAR 2 KYPHOPLASTY;  Surgeon: Phylliss Bob, MD;  Location: [place];  Service: Orthopedics;  Laterality: N/A;   LAPAROSCOPIC APPENDECTOMY N/A 12/09/2021   Procedure: APPENDECTOMY LAPAROSCOPIC;  Surgeon: Ileana Roup, MD;  Location: [place];  Service: General;  Laterality: N/A;   LUMBAR DISC SURGERY     MODIFIED RADICAL MASTECTOMY W/ AXILLARY LYMPH NODE DISSECTION     Left   RECONSTRUCTION BREAST W/ LATISSIMUS DORSI FLAP     left   REDUCTION MAMMAPLASTY Right    right foot surgery     corrected hammer toe, straightened big toe   SEPTOPLASTY     TOE SURGERY     TONSILLECTOMY AND ADENOIDECTOMY     Patient Active Problem List   Diagnosis Date Noted   Acute appendicitis 12/09/2021   S/P laparoscopic appendectomy 12/09/2021   CAP (community acquired pneumonia) 09/30/2020   Chronic respiratory failure with hypoxia ([place]) 09/23/2020   Allergic rhinitis 08/31/2019   Pilar cyst 08/04/2018   History of breast cancer in female 06/09/2018   History of reconstruction of both breasts 06/09/2018   Severe obesity (BMI >= 40) ([place]) 09/16/2015   Asthma with acute exacerbation 09/15/2015  DOE (dyspnea on exertion) 01/11/2014   Insomnia 05/05/2012   Osteoarthritis    Osteopenia    Collagenous colitis    OSA (obstructive sleep apnea)    Hypothyroidism    Mild persistent asthma    Hypertension    Breast cancer, left breast ([facility])     REFERRING DIAG: s/p left knee scope  THERAPY DIAG:  Chronic pain of left knee  Muscle weakness (generalized)  Other abnormalities of gait and mobility  Rationale for Evaluation and Treatment Rehabilitation  PERTINENT HISTORY: Lt knee partial lateral meniscectomy on 02/23/22, breast cx, anxiety, asthma, compression fx, osteopenia  PRECAUTIONS: Falls  SUBJECTIVE: Pt reports continued stiffness when getting out of bed in the morning and after prolonged sitting. She rates her Lt knee pain as 2/10 today.  PAIN:  Are you having  pain? Yes: NPRS scale: 2/10 Pain location: anterior Lt knee Pain description: sharp Aggravating factors: prolonged standing/walking, activity in general Relieving factors: rest with pillow under the knee   OBJECTIVE: (objective measures completed at initial evaluation unless otherwise dated)   DIAGNOSTIC FINDINGS:  Lt knee MRI: IMPRESSION: 1. Advanced tricompartmental osteoarthritis, most severe within the lateral compartment. 2. Advanced degeneration and tearing throughout the lateral meniscus. 3. Small joint effusion with multiple intra-articular loose bodies.   PATIENT SURVEYS:  FOTO 37% function to 52% predicted   COGNITION:           Overall cognitive status: Within functional limits for tasks assessed                          SENSATION: Not tested   EDEMA:  No obvious swelling about the Lt knee      POSTURE: rounded shoulders and forward head   PALPATION: TTP lateral joint line    LOWER EXTREMITY ROM:   Active ROM Right eval Left eval Right 04/09/2022 Left 04/09/2022 Left 04/15/2022  Hip flexion         Hip extension         Hip abduction         Hip adduction         Hip internal rotation         Hip external rotation         Knee flexion 120 109 113 95, 102 following  AAROM 101, 107 following AAROM  Knee extension Unable to accurately assess due to hip flexor tightness  Unable to accurately assess due to hip flexor tightness  -7 -20   Ankle dorsiflexion         Ankle plantarflexion         Ankle inversion         Ankle eversion          (Blank rows = not tested)   LOWER EXTREMITY MMT:   MMT Right eval Left eval  Hip flexion 4+ 4  Hip extension      Hip abduction      Hip adduction      Hip internal rotation      Hip external rotation      Knee flexion 5 4+  Knee extension 5 3-  Ankle dorsiflexion      Ankle plantarflexion      Ankle inversion      Ankle eversion       (Blank rows = not tested)   LOWER EXTREMITY SPECIAL TESTS:  None     FUNCTIONAL TESTS:  5 x STS 14.65 seconds TUG 23.8 seconds  GAIT: Distance walked: 10 ft  Assistive device utilized: Single point cane Level of assistance: Modified independence Comments: limited knee flexion during stance and swing; WBOS       TODAY'S TREATMENT:  [facility] Adult PT Treatment:                                                DATE: 04/16/2022 Therapeutic Exercise: Supine Lt knee AAROM with green strap 2x10 with 5-sec hold, PT assistance PRN Prone hamstring curls with 3# ankle weights with knees on pillow 3x8 Prone knee extension hangs with 3# ankle weights and knees on pillow 3x30 sec Seated LAQ with 3# ankle weights 3x10 BIL Squat with UE support 2x10 Manual Therapy: N/A Neuromuscular re-ed: N/A Therapeutic Activity: N/A Modalities: N/A Self Care: N/A   [facility] Adult PT Treatment:                                                DATE: 04/15/2022 Therapeutic Exercise: Supine Lt knee AAROM with green strap 3x10 with 5-sec hold, PT assistance PRN Seated Lt active hamstring stretch with stool x45mn Seated hamstring curls with 25# from roughly 30d knee flexion to 80d knee flexion 3x10 Seated BIL knee extension machine with 10# and with pillow under thighs from roughly 90d knee flexion to 30d knee flexion 3x10 Mini squat with UE support 2x8 Standing mini marches 2x10 Manual Therapy: N/A Neuromuscular re-ed: N/A Therapeutic Activity: N/A Modalities: N/A Self Care: N/A   [facility] Adult PT Treatment:                                                DATE: 04/09/2022 Therapeutic Exercise: Supine Lt knee AAROM with green strap 3x10 with 5-sec hold, PT assistance PRN Supine Lt hamstring stretch with strap 2x197m Standing squat with UE support 2x10 Standing high marching with UE support 2x10 Seated clamshells with blue band 2x10 Manual Therapy: N/A Neuromuscular re-ed: N/A Therapeutic Activity: N/A Modalities: N/A Self Care: N/A       PATIENT EDUCATION:   Education details: see treatment  Person educated: Patient Education method: Explanation, Demonstration, Tactile cues, Verbal cues, and Handouts Education comprehension: verbalized understanding, returned demonstration, verbal cues required, tactile cues required, and needs further education     HOME EXERCISE PROGRAM: Access Code: [email or web address]: [email or web address] Date: 04/01/2022 Prepared by: SaGwendolyn Grant Exercises - Standing March with Counter Support  - 2 x daily - 7 x weekly - 2 sets - 10 reps - Supine Heel Slide  - 2 x daily - 7 x weekly - 1 sets - 10 reps - 5 sec  hold - Supine Quad Set  - 2 x daily - 7 x weekly - 2 sets - 10 reps - 5 sec  hold - Seated Knee Extension AAROM  - 2 x daily - 7 x weekly - 1 sets - 10 reps - 5 sec  hold   ASSESSMENT:   CLINICAL IMPRESSION: Pt responded well to all interventions today, demonstrating good form and mild pain throughout the session. She will continue to benefit from skilled PT to address  her primary impairments and return to her prior level of function with less limitation.     OBJECTIVE IMPAIRMENTS Abnormal gait, decreased activity tolerance, decreased balance, difficulty walking, decreased ROM, decreased strength, improper body mechanics, postural dysfunction, and pain.    ACTIVITY LIMITATIONS carrying, lifting, bending, standing, squatting, stairs, transfers, and locomotion level   PARTICIPATION LIMITATIONS: meal prep, cleaning, laundry, shopping, and community activity   PERSONAL FACTORS Age, Fitness, Time since onset of injury/illness/exacerbation, and 3+ comorbidities: see PMH above  are also affecting patient's functional outcome.        GOALS: Goals reviewed with patient? Yes   SHORT TERM GOALS: Target date: 04/23/22 Patient will be independent and compliant with initial HEP.    Baseline: issued at eval  04/15/2022: Pt reports adherence to her HEP Goal status: ACHIEVED   2.  Patient will  demonstrate at least 120 degrees of Lt knee flexion AROM to improve gait mechanics.  Baseline: see above  04/15/2022: 107d flexion following AAROM Goal status: IN PROGRESS   3.  Patient will complete TUG in </= 18 seconds to signify a reduction in fall risk.  Baseline: see above  Goal status: INITIAL     LONG TERM GOALS: Target date: 05/14/22   Patient will demonstrate at least 4/5 Lt knee extension strength to improve ability to complete stair and curb negotiation.  Baseline: see above  Goal status: INITIAL   2.  Patient will report pain at worst rated as </=5/10 to reduce her current functional limitations.  Baseline: 8/10  Goal status: INITIAL   3.  Patient will be able to participate in community activity independently.  Baseline: unable to attend community events independently at this time due to pain.  Goal status: INITIAL   4.  Patient will score at least 52% on FOTO to signify clinically meaningful improvement in functional abilities.    Baseline: 37 Goal status: INITIAL     PLAN: PT FREQUENCY: 2x/week   PT DURATION: 6 weeks   PLANNED INTERVENTIONS: Therapeutic exercises, Therapeutic activity, Neuromuscular re-education, Balance training, Gait training, Patient/Family education, Self Care, Joint mobilization, Stair training, Aquatic Therapy, Dry Needling, Cryotherapy, Moist heat, Vasopneumatic device, Manual therapy, and Re-evaluation   PLAN FOR NEXT SESSION: review and progress HEP prn; manual to knee; focus on restoring flexion, hip flexor stretching, quad strengthening.    Vanessa [place], PT, DPT 04/16/22 10:43 AM

## 2022-04-22 ENCOUNTER — Ambulatory Visit: Payer: Medicare HMO

## 2022-04-22 DIAGNOSIS — G8929 Other chronic pain: Secondary | ICD-10-CM

## 2022-04-22 DIAGNOSIS — R2689 Other abnormalities of gait and mobility: Secondary | ICD-10-CM

## 2022-04-22 DIAGNOSIS — M25562 Pain in left knee: Secondary | ICD-10-CM | POA: Diagnosis not present

## 2022-04-22 DIAGNOSIS — M6281 Muscle weakness (generalized): Secondary | ICD-10-CM

## 2022-04-22 NOTE — Therapy (Signed)
OUTPATIENT PHYSICAL THERAPY TREATMENT NOTE   Patient Name: [name] MRN: [id number] DOB:[birth year], 81 y.o., female Today's Date: 04/22/2022  PCP: [name] REFERRING PROVIDER: Frederik Pear, MD  END OF SESSION:   PT End of Session - 04/22/22 1212     Visit Number 6    Number of Visits 13    Date for PT Re-Evaluation 05/15/22    Authorization Type [employer] MCR    Authorization Time Period FOTO V6, V10, kx mod v15    Progress Note Due on Visit 10    PT Start Time 1215    PT Stop Time 1255    PT Time Calculation (min) 40 min    Equipment Utilized During Treatment Gait belt    Activity Tolerance Patient tolerated treatment well    Behavior During Therapy WFL for tasks assessed/performed                 Past Medical History:  Diagnosis Date   Anemia    PMH;  Patient denies this dx as of 06/03/21   Anxiety    Asthma    Breast cancer ([place]) 1988   left breast cancer   Collagenous colitis    Compression fracture    lumbar 1   Dyspnea    with exertion   Hypertension    Hypothyroidism    Osteoarthritis    Osteopenia    Pneumonia 07/2020   PONV (postoperative nausea and vomiting)    Sleep apnea    wears CPAP nightly   Past Surgical History:  Procedure Laterality Date   BREAST REDUCTION SURGERY     Right   CATARACT EXTRACTION W/ INTRAOCULAR LENS  IMPLANT, BILATERAL     CESAREAN SECTION     COLONOSCOPY W/ BIOPSIES AND POLYPECTOMY     CYST EXCISION N/A 08/30/2018   Procedure: excision of posterior shoulder / back sebaceous cyst;  Surgeon: Wallace Going, DO;  Location: [facility];  Service: Plastics;  Laterality: N/A;   KNEE ARTHROSCOPY Left 02/23/2022   Procedure: LEFT KNEE ARTHROSCOPY;  Surgeon: Melrose Nakayama, MD;  Location: [facility];  Service: Orthopedics;  Laterality: Left;   KYPHOPLASTY N/A 02/18/2016   Procedure: LUMBAR 1 KYPHOPLASTY;  Surgeon: Phylliss Bob, MD;  Location: [facility];  Service: Orthopedics;  Laterality:  N/A;  LUMBAR 1 KYPHOPLASTY   KYPHOPLASTY N/A 06/04/2021   Procedure: LUMBAR 2 KYPHOPLASTY;  Surgeon: Phylliss Bob, MD;  Location: [facility];  Service: Orthopedics;  Laterality: N/A;   LAPAROSCOPIC APPENDECTOMY N/A 12/09/2021   Procedure: APPENDECTOMY LAPAROSCOPIC;  Surgeon: Ileana Roup, MD;  Location: [facility];  Service: General;  Laterality: N/A;   LUMBAR DISC SURGERY     MODIFIED RADICAL MASTECTOMY W/ AXILLARY LYMPH NODE DISSECTION     Left   RECONSTRUCTION BREAST W/ LATISSIMUS DORSI FLAP     left   REDUCTION MAMMAPLASTY Right    right foot surgery     corrected hammer toe, straightened big toe   SEPTOPLASTY     TOE SURGERY     TONSILLECTOMY AND ADENOIDECTOMY     Patient Active Problem List   Diagnosis Date Noted   Acute appendicitis 12/09/2021   S/P laparoscopic appendectomy 12/09/2021   CAP (community acquired pneumonia) 09/30/2020   Chronic respiratory failure with hypoxia ([place]) 09/23/2020   Allergic rhinitis 08/31/2019   Pilar cyst 08/04/2018   History of breast cancer in female 06/09/2018   History of reconstruction of both breasts 06/09/2018   Severe obesity (BMI >=  50) ([place]) 09/16/2015   Asthma with acute exacerbation 09/15/2015   DOE (dyspnea on exertion) 01/11/2014   Insomnia 05/05/2012   Osteoarthritis    Osteopenia    Collagenous colitis    OSA (obstructive sleep apnea)    Hypothyroidism    Mild persistent asthma    Hypertension    Breast cancer, left breast ([place])     REFERRING DIAG: s/p left knee scope  THERAPY DIAG:  Chronic pain of left knee  Muscle weakness (generalized)  Other abnormalities of gait and mobility  Rationale for Evaluation and Treatment Rehabilitation  PERTINENT HISTORY: Lt knee partial lateral meniscectomy on 02/23/22, breast cx, anxiety, asthma, compression fx, osteopenia  PRECAUTIONS: Falls  SUBJECTIVE: Pt received a steroid injection in her Lt knee yesterday. She rates her pain as 1/10 today. She is also planning to have  orthovisc injection in BIL knees soon.  PAIN:  Are you having pain? Yes: NPRS scale: 1/10 Pain location: anterior Lt knee Pain description: sharp Aggravating factors: prolonged standing/walking, activity in general Relieving factors: rest with pillow under the knee   OBJECTIVE: (objective measures completed at initial evaluation unless otherwise dated)   DIAGNOSTIC FINDINGS:  Lt knee MRI: IMPRESSION: 1. Advanced tricompartmental osteoarthritis, most severe within the lateral compartment. 2. Advanced degeneration and tearing throughout the lateral meniscus. 3. Small joint effusion with multiple intra-articular loose bodies.   PATIENT SURVEYS:  FOTO 37% function to 52% predicted  04/22/2022: 36%   COGNITION:           Overall cognitive status: Within functional limits for tasks assessed                          SENSATION: Not tested   EDEMA:  No obvious swelling about the Lt knee      POSTURE: rounded shoulders and forward head   PALPATION: TTP lateral joint line    LOWER EXTREMITY ROM:   Active ROM Right eval Left eval Right 04/09/2022 Left 04/09/2022 Left 04/15/2022 Left 04/22/2022  Hip flexion          Hip extension          Hip abduction          Hip adduction          Hip internal rotation          Hip external rotation          Knee flexion 120 109 113 95, 102 following  AAROM 101, 107 following AAROM 104, 109 following AAROM  Knee extension Unable to accurately assess due to hip flexor tightness  Unable to accurately assess due to hip flexor tightness  -7 -20    Ankle dorsiflexion          Ankle plantarflexion          Ankle inversion          Ankle eversion           (Blank rows = not tested)   LOWER EXTREMITY MMT:   MMT Right eval Left eval Left 04/22/2022  Hip flexion 4+ 4   Hip extension       Hip abduction       Hip adduction       Hip internal rotation       Hip external rotation       Knee flexion 5 4+ 5/5  Knee extension 5 3- 5/5   Ankle dorsiflexion       Ankle plantarflexion  Ankle inversion       Ankle eversion        (Blank rows = not tested)   LOWER EXTREMITY SPECIAL TESTS:  None    FUNCTIONAL TESTS:  5 x STS 14.65 seconds TUG 23.8 seconds   04/22/2022: TUG: 16 seconds 5xSTS: 12.5 seconds   GAIT: Distance walked: 10 ft  Assistive device utilized: Single point cane Level of assistance: Modified independence Comments: limited knee flexion during stance and swing; WBOS       TODAY'S TREATMENT:  [facility] Adult PT Treatment:                                                DATE: 04/22/2022 Therapeutic Exercise: Supine Lt knee AAROM with green strap 2x10 with 5-sec hold, PT assistance PRN Supine Lt knee SLR with abduction with 2.5# ankle weight 2x10 Supine bicycles 3x12 Seated LAQ with YTB 2x10 BIL Manual Therapy: N/A Neuromuscular re-ed: N/A Therapeutic Activity: Re-administration of FOTO with pt education Re-assessment of objective measures with pt education Modalities: N/A Self Care: N/A   [facility] Adult PT Treatment:                                                DATE: 04/16/2022 Therapeutic Exercise: Supine Lt knee AAROM with green strap 2x10 with 5-sec hold, PT assistance PRN Prone hamstring curls with 3# ankle weights with knees on pillow 3x8 Prone knee extension hangs with 3# ankle weights and knees on pillow 3x30 sec Seated LAQ with 3# ankle weights 3x10 BIL Squat with UE support 2x10 Manual Therapy: N/A Neuromuscular re-ed: N/A Therapeutic Activity: N/A Modalities: N/A Self Care: N/A   [facility] Adult PT Treatment:                                                DATE: 04/15/2022 Therapeutic Exercise: Supine Lt knee AAROM with green strap 3x10 with 5-sec hold, PT assistance PRN Seated Lt active hamstring stretch with stool x77mn Seated hamstring curls with 25# from roughly 30d knee flexion to 80d knee flexion 3x10 Seated BIL knee extension machine with 10# and with pillow  under thighs from roughly 90d knee flexion to 30d knee flexion 3x10 Mini squat with UE support 2x8 Standing mini marches 2x10 Manual Therapy: N/A Neuromuscular re-ed: N/A Therapeutic Activity: N/A Modalities: N/A Self Care: N/A       PATIENT EDUCATION:  Education details: see treatment  Person educated: Patient Education method: Explanation, Demonstration, Tactile cues, Verbal cues, and Handouts Education comprehension: verbalized understanding, returned demonstration, verbal cues required, tactile cues required, and needs further education     HOME EXERCISE PROGRAM: Access Code: [email or web address]: [email or web address] Date: 04/01/2022 Prepared by: SGwendolyn Grant  Exercises - Standing March with Counter Support  - 2 x daily - 7 x weekly - 2 sets - 10 reps - Supine Heel Slide  - 2 x daily - 7 x weekly - 1 sets - 10 reps - 5 sec  hold - Supine Quad Set  - 2 x daily - 7 x weekly - 2 sets - 10 reps - 5  sec  hold - Seated Knee Extension AAROM  - 2 x daily - 7 x weekly - 1 sets - 10 reps - 5 sec  hold  Added 04/22/2022: - Seated Long Arc Quad  - 1 x daily - 7 x weekly - 3 sets - 10 reps - 3 seconds hold - Supine Core Bicycle  - 1 x daily - 7 x weekly - 3 sets - 12 reps   ASSESSMENT:   CLINICAL IMPRESSION: Upon re-assessment of pt goals, the pt has made excellent improvements in Lt knee strength, ROM, functional ability, TUG score, and 5xSTS. She responded well to all interventions today and will continue to benefit from skilled PT to address her primary impairments and return to her prior level of function with less limitation.      OBJECTIVE IMPAIRMENTS Abnormal gait, decreased activity tolerance, decreased balance, difficulty walking, decreased ROM, decreased strength, improper body mechanics, postural dysfunction, and pain.    ACTIVITY LIMITATIONS carrying, lifting, bending, standing, squatting, stairs, transfers, and locomotion level   PARTICIPATION  LIMITATIONS: meal prep, cleaning, laundry, shopping, and community activity   PERSONAL FACTORS Age, Fitness, Time since onset of injury/illness/exacerbation, and 3+ comorbidities: see PMH above  are also affecting patient's functional outcome.        GOALS: Goals reviewed with patient? Yes   SHORT TERM GOALS: Target date: 04/23/22 Patient will be independent and compliant with initial HEP.    Baseline: issued at eval  04/15/2022: Pt reports adherence to her HEP Goal status: ACHIEVED   2.  Patient will demonstrate at least 120 degrees of Lt knee flexion AROM to improve gait mechanics.  Baseline: see above  04/15/2022: 107d flexion following AAROM 04/22/2022: 109d flexion following AAROM Goal status: IN PROGRESS   3.  Patient will complete TUG in </= 18 seconds to signify a reduction in fall risk.  Baseline: see above  04/21/2022: 16 seconds Goal status: ACHIEVED     LONG TERM GOALS: Target date: 05/14/22   Patient will demonstrate at least 4/5 Lt knee extension strength to improve ability to complete stair and curb negotiation.  Baseline: see above  04/22/2022: 5/5 Goal status: ACHIEVED   2.  Patient will report pain at worst rated as </=5/10 to reduce her current functional limitations.  Baseline: 8/10  04/22/2022: 5/10 worst pain Goal status: ACHIEVED   3.  Patient will be able to participate in community activity independently.  Baseline: unable to attend community events independently at this time due to pain.  04/22/2022: Pt reports ability to partake in community activity such as going to plays, but not grocery shopping Goal status: IN PROGRESS   4.  Patient will score at least 52% on FOTO to signify clinically meaningful improvement in functional abilities.    Baseline: 37 04/22/2022: 36% Goal status: IN PROGRESS     PLAN: PT FREQUENCY: 2x/week   PT DURATION: 6 weeks   PLANNED INTERVENTIONS: Therapeutic exercises, Therapeutic activity, Neuromuscular  re-education, Balance training, Gait training, Patient/Family education, Self Care, Joint mobilization, Stair training, Aquatic Therapy, Dry Needling, Cryotherapy, Moist heat, Vasopneumatic device, Manual therapy, and Re-evaluation   PLAN FOR NEXT SESSION: review and progress HEP prn; manual to knee; focus on restoring flexion, hip flexor stretching, quad strengthening.    Vanessa Mulberry, PT, DPT 04/22/22 12:57 PM

## 2022-04-23 ENCOUNTER — Ambulatory Visit: Payer: Medicare HMO

## 2022-04-23 DIAGNOSIS — R2689 Other abnormalities of gait and mobility: Secondary | ICD-10-CM

## 2022-04-23 DIAGNOSIS — G8929 Other chronic pain: Secondary | ICD-10-CM

## 2022-04-23 DIAGNOSIS — M25562 Pain in left knee: Secondary | ICD-10-CM | POA: Diagnosis not present

## 2022-04-23 DIAGNOSIS — M6281 Muscle weakness (generalized): Secondary | ICD-10-CM

## 2022-04-23 NOTE — Therapy (Signed)
OUTPATIENT PHYSICAL THERAPY TREATMENT NOTE   Patient Name: [name] MRN: [id number] DOB:1941/01/13, 81 y.o., female Today's Date: 04/23/2022  PCP: [name] REFERRING PROVIDER: Frederik Pear, MD  END OF SESSION:   PT End of Session - 04/23/22 1301     Visit Number 7    Number of Visits 13    Date for PT Re-Evaluation 05/15/22    Authorization Type [employer] MCR    Authorization Time Period FOTO V6, V10, kx mod v15    Progress Note Due on Visit 10    PT Start Time 1300    PT Stop Time 1340    PT Time Calculation (min) 40 min    Equipment Utilized During Treatment Gait belt    Activity Tolerance Patient tolerated treatment well    Behavior During Therapy WFL for tasks assessed/performed                  Past Medical History:  Diagnosis Date   Anemia    PMH;  Patient denies this dx as of 06/03/21   Anxiety    Asthma    Breast cancer ([place]) 1988   left breast cancer   Collagenous colitis    Compression fracture    lumbar 1   Dyspnea    with exertion   Hypertension    Hypothyroidism    Osteoarthritis    Osteopenia    Pneumonia 07/2020   PONV (postoperative nausea and vomiting)    Sleep apnea    wears CPAP nightly   Past Surgical History:  Procedure Laterality Date   BREAST REDUCTION SURGERY     Right   CATARACT EXTRACTION W/ INTRAOCULAR LENS  IMPLANT, BILATERAL     CESAREAN SECTION     COLONOSCOPY W/ BIOPSIES AND POLYPECTOMY     CYST EXCISION N/A 08/30/2018   Procedure: excision of posterior shoulder / back sebaceous cyst;  Surgeon: Wallace Going, DO;  Location: [facility];  Service: Plastics;  Laterality: N/A;   KNEE ARTHROSCOPY Left 02/23/2022   Procedure: LEFT KNEE ARTHROSCOPY;  Surgeon: Melrose Nakayama, MD;  Location[facility];  Service: Orthopedics;  Laterality: Left;   KYPHOPLASTY N/A 02/18/2016   Procedure: LUMBAR 1 KYPHOPLASTY;  Surgeon: Phylliss Bob, MD;  Location: [facility];  Service: Orthopedics;  Laterality:  N/A;  LUMBAR 1 KYPHOPLASTY   KYPHOPLASTY N/A 06/04/2021   Procedure: LUMBAR 2 KYPHOPLASTY;  Surgeon: Phylliss Bob, MD;  Location: [facility];  Service: Orthopedics;  Laterality: N/A;   LAPAROSCOPIC APPENDECTOMY N/A 12/09/2021   Procedure: APPENDECTOMY LAPAROSCOPIC;  Surgeon: Ileana Roup, MD;  Location: [facility];  Service: General;  Laterality: N/A;   LUMBAR DISC SURGERY     MODIFIED RADICAL MASTECTOMY W/ AXILLARY LYMPH NODE DISSECTION     Left   RECONSTRUCTION BREAST W/ LATISSIMUS DORSI FLAP     left   REDUCTION MAMMAPLASTY Right    right foot surgery     corrected hammer toe, straightened big toe   SEPTOPLASTY     TOE SURGERY     TONSILLECTOMY AND ADENOIDECTOMY     Patient Active Problem List   Diagnosis Date Noted   Acute appendicitis 12/09/2021   S/P laparoscopic appendectomy 12/09/2021   CAP (community acquired pneumonia) 09/30/2020   Chronic respiratory failure with hypoxia ([place]) 09/23/2020   Allergic rhinitis 08/31/2019   Pilar cyst 08/04/2018   History of breast cancer in female 06/09/2018   History of reconstruction of both breasts 06/09/2018   Severe obesity (BMI >=  36) ([place]) 09/16/2015   Asthma with acute exacerbation 09/15/2015   DOE (dyspnea on exertion) 01/11/2014   Insomnia 05/05/2012   Osteoarthritis    Osteopenia    Collagenous colitis    OSA (obstructive sleep apnea)    Hypothyroidism    Mild persistent asthma    Hypertension    Breast cancer, left breast ([place])     REFERRING DIAG: s/p left knee scope  THERAPY DIAG:  Chronic pain of left knee  Muscle weakness (generalized)  Other abnormalities of gait and mobility  Rationale for Evaluation and Treatment Rehabilitation  PERTINENT HISTORY: Lt knee partial lateral meniscectomy on 02/23/22, breast cx, anxiety, asthma, compression fx, osteopenia  PRECAUTIONS: Falls  SUBJECTIVE: Pt reports 2/10 Lt knee pain today, and she adds that yesterday's appointment went well.   PAIN:  Are you having pain?  Yes: NPRS scale: 2/10 Pain location: anterior Lt knee Pain description: sharp Aggravating factors: prolonged standing/walking, activity in general Relieving factors: rest with pillow under the knee   OBJECTIVE: (objective measures completed at initial evaluation unless otherwise dated)   DIAGNOSTIC FINDINGS:  Lt knee MRI: IMPRESSION: 1. Advanced tricompartmental osteoarthritis, most severe within the lateral compartment. 2. Advanced degeneration and tearing throughout the lateral meniscus. 3. Small joint effusion with multiple intra-articular loose bodies.   PATIENT SURVEYS:  FOTO 37% function to 52% predicted  04/22/2022: 36%   COGNITION:           Overall cognitive status: Within functional limits for tasks assessed                          SENSATION: Not tested   EDEMA:  No obvious swelling about the Lt knee      POSTURE: rounded shoulders and forward head   PALPATION: TTP lateral joint line    LOWER EXTREMITY ROM:   Active ROM Right eval Left eval Right 04/09/2022 Left 04/09/2022 Left 04/15/2022 Left 04/22/2022  Hip flexion          Hip extension          Hip abduction          Hip adduction          Hip internal rotation          Hip external rotation          Knee flexion 120 109 113 95, 102 following  AAROM 101, 107 following AAROM 104, 109 following AAROM  Knee extension Unable to accurately assess due to hip flexor tightness  Unable to accurately assess due to hip flexor tightness  -7 -20    Ankle dorsiflexion          Ankle plantarflexion          Ankle inversion          Ankle eversion           (Blank rows = not tested)   LOWER EXTREMITY MMT:   MMT Right eval Left eval Left 04/22/2022  Hip flexion 4+ 4   Hip extension       Hip abduction       Hip adduction       Hip internal rotation       Hip external rotation       Knee flexion 5 4+ 5/5  Knee extension 5 3- 5/5  Ankle dorsiflexion       Ankle plantarflexion       Ankle  inversion  Ankle eversion        (Blank rows = not tested)   LOWER EXTREMITY SPECIAL TESTS:  None    FUNCTIONAL TESTS:  5 x STS 14.65 seconds TUG 23.8 seconds   04/22/2022: TUG: 16 seconds 5xSTS: 12.5 seconds   GAIT: Distance walked: 10 ft  Assistive device utilized: Single point cane Level of assistance: Modified independence Comments: limited knee flexion during stance and swing; WBOS       TODAY'S TREATMENT:  [facility] Adult PT Treatment:                                                DATE: 04/23/2022 Therapeutic Exercise: Standing Romanian deadlift with 15# kettlebell 2x8 Supine bicycles 3x12 Supine Lt knee AAROM with green strap 2x10 with 5-sec hold, PT assistance PRN Supine Lt hamstring stretch with green strap x84mn Standing hamstring curls with 7# cable to ankle 2x10 BIL Standing Cybex hip abduction with 25# 2x10 BIL Manual Therapy: N/A Neuromuscular re-ed: N/A Therapeutic Activity: N/A Modalities: N/A Self Care: N/A   [facility] Adult PT Treatment:                                                DATE: 04/22/2022 Therapeutic Exercise: Supine Lt knee AAROM with green strap 2x10 with 5-sec hold, PT assistance PRN Supine Lt knee SLR with abduction with 2.5# ankle weight 2x10 Supine bicycles 3x12 Seated LAQ with YTB 2x10 BIL Manual Therapy: N/A Neuromuscular re-ed: N/A Therapeutic Activity: Re-administration of FOTO with pt education Re-assessment of objective measures with pt education Modalities: N/A Self Care: N/A   [facility] Adult PT Treatment:                                                DATE: 04/16/2022 Therapeutic Exercise: Supine Lt knee AAROM with green strap 2x10 with 5-sec hold, PT assistance PRN Prone hamstring curls with 3# ankle weights with knees on pillow 3x8 Prone knee extension hangs with 3# ankle weights and knees on pillow 3x30 sec Seated LAQ with 3# ankle weights 3x10 BIL Squat with UE support 2x10 Manual  Therapy: N/A Neuromuscular re-ed: N/A Therapeutic Activity: N/A Modalities: N/A Self Care: N/A       PATIENT EDUCATION:  Education details: see treatment  Person educated: Patient Education method: Explanation, Demonstration, Tactile cues, Verbal cues, and Handouts Education comprehension: verbalized understanding, returned demonstration, verbal cues required, tactile cues required, and needs further education     HOME EXERCISE PROGRAM: Access Code: [email or web address]: [email or web address] Date: 04/01/2022 Prepared by: SGwendolyn Grant  Exercises - Standing March with Counter Support  - 2 x daily - 7 x weekly - 2 sets - 10 reps - Supine Heel Slide  - 2 x daily - 7 x weekly - 1 sets - 10 reps - 5 sec  hold - Supine Quad Set  - 2 x daily - 7 x weekly - 2 sets - 10 reps - 5 sec  hold - Seated Knee Extension AAROM  - 2 x daily - 7 x weekly - 1 sets - 10 reps - 5 sec  hold  Added 04/22/2022: - Seated Long Arc Quad  - 1 x daily - 7 x weekly - 3 sets - 10 reps - 3 seconds hold - Supine Core Bicycle  - 1 x daily - 7 x weekly - 3 sets - 12 reps   ASSESSMENT:   CLINICAL IMPRESSION: Pt responded well to all interventions today, demonstrating good form and no pain with progressed strengthening exercises. She will continue to benefit from skilled PT to address her primary impairments and return to her prior level of function with less limitation.     OBJECTIVE IMPAIRMENTS Abnormal gait, decreased activity tolerance, decreased balance, difficulty walking, decreased ROM, decreased strength, improper body mechanics, postural dysfunction, and pain.    ACTIVITY LIMITATIONS carrying, lifting, bending, standing, squatting, stairs, transfers, and locomotion level   PARTICIPATION LIMITATIONS: meal prep, cleaning, laundry, shopping, and community activity   PERSONAL FACTORS Age, Fitness, Time since onset of injury/illness/exacerbation, and 3+ comorbidities: see PMH above  are also  affecting patient's functional outcome.        GOALS: Goals reviewed with patient? Yes   SHORT TERM GOALS: Target date: 04/23/22 Patient will be independent and compliant with initial HEP.    Baseline: issued at eval  04/15/2022: Pt reports adherence to her HEP Goal status: ACHIEVED   2.  Patient will demonstrate at least 120 degrees of Lt knee flexion AROM to improve gait mechanics.  Baseline: see above  04/15/2022: 107d flexion following AAROM 04/22/2022: 109d flexion following AAROM Goal status: IN PROGRESS   3.  Patient will complete TUG in </= 18 seconds to signify a reduction in fall risk.  Baseline: see above  04/21/2022: 16 seconds Goal status: ACHIEVED     LONG TERM GOALS: Target date: 05/14/22   Patient will demonstrate at least 4/5 Lt knee extension strength to improve ability to complete stair and curb negotiation.  Baseline: see above  04/22/2022: 5/5 Goal status: ACHIEVED   2.  Patient will report pain at worst rated as </=5/10 to reduce her current functional limitations.  Baseline: 8/10  04/22/2022: 5/10 worst pain Goal status: ACHIEVED   3.  Patient will be able to participate in community activity independently.  Baseline: unable to attend community events independently at this time due to pain.  04/22/2022: Pt reports ability to partake in community activity such as going to plays, but not grocery shopping Goal status: IN PROGRESS   4.  Patient will score at least 52% on FOTO to signify clinically meaningful improvement in functional abilities.    Baseline: 37 04/22/2022: 36% Goal status: IN PROGRESS     PLAN: PT FREQUENCY: 2x/week   PT DURATION: 6 weeks   PLANNED INTERVENTIONS: Therapeutic exercises, Therapeutic activity, Neuromuscular re-education, Balance training, Gait training, Patient/Family education, Self Care, Joint mobilization, Stair training, Aquatic Therapy, Dry Needling, Cryotherapy, Moist heat, Vasopneumatic device, Manual  therapy, and Re-evaluation   PLAN FOR NEXT SESSION: review and progress HEP prn; manual to knee; focus on restoring flexion, hip flexor stretching, quad strengthening.    Vanessa , PT, DPT 04/23/22 1:41 PM

## 2022-04-27 ENCOUNTER — Ambulatory Visit: Payer: Medicare HMO

## 2022-04-27 DIAGNOSIS — R2689 Other abnormalities of gait and mobility: Secondary | ICD-10-CM

## 2022-04-27 DIAGNOSIS — M6281 Muscle weakness (generalized): Secondary | ICD-10-CM

## 2022-04-27 DIAGNOSIS — M25562 Pain in left knee: Secondary | ICD-10-CM | POA: Diagnosis not present

## 2022-04-27 DIAGNOSIS — G8929 Other chronic pain: Secondary | ICD-10-CM

## 2022-04-27 NOTE — Therapy (Signed)
OUTPATIENT PHYSICAL THERAPY TREATMENT NOTE   Patient Name: [name] MRN: [id number] DOB:08-13-1940, 81 y.o., female Today's Date: 04/27/2022  PCP: [name] REFERRING PROVIDER: Frederik Pear, MD  END OF SESSION:   PT End of Session - 04/27/22 1305     Visit Number 8    Number of Visits 13    Date for PT Re-Evaluation 05/15/22    Authorization Type [employer] MCR    Authorization Time Period FOTO V6, V10, kx mod v15    Progress Note Due on Visit 10    PT Start Time 1305    PT Stop Time 1345    PT Time Calculation (min) 40 min    Equipment Utilized During Treatment Gait belt    Activity Tolerance Patient tolerated treatment well    Behavior During Therapy WFL for tasks assessed/performed                   Past Medical History:  Diagnosis Date   Anemia    PMH;  Patient denies this dx as of 06/03/21   Anxiety    Asthma    Breast cancer ([place]) 1988   left breast cancer   Collagenous colitis    Compression fracture    lumbar 1   Dyspnea    with exertion   Hypertension    Hypothyroidism    Osteoarthritis    Osteopenia    Pneumonia 07/2020   PONV (postoperative nausea and vomiting)    Sleep apnea    wears CPAP nightly   Past Surgical History:  Procedure Laterality Date   BREAST REDUCTION SURGERY     Right   CATARACT EXTRACTION W/ INTRAOCULAR LENS  IMPLANT, BILATERAL     CESAREAN SECTION     COLONOSCOPY W/ BIOPSIES AND POLYPECTOMY     CYST EXCISION N/A 08/30/2018   Procedure: excision of posterior shoulder / back sebaceous cyst;  Surgeon: Wallace Going, DO;  Location: [facility];  Service: Plastics;  Laterality: N/A;   KNEE ARTHROSCOPY Left 02/23/2022   Procedure: LEFT KNEE ARTHROSCOPY;  Surgeon: Melrose Nakayama, MD;  Location: [facility];  Service: Orthopedics;  Laterality: Left;   KYPHOPLASTY N/A 02/18/2016   Procedure: LUMBAR 1 KYPHOPLASTY;  Surgeon: Phylliss Bob, MD;  Location: [facility];  Service: Orthopedics;   Laterality: N/A;  LUMBAR 1 KYPHOPLASTY   KYPHOPLASTY N/A 06/04/2021   Procedure: LUMBAR 2 KYPHOPLASTY;  Surgeon: Phylliss Bob, MD;  Location: [facility];  Service: Orthopedics;  Laterality: N/A;   LAPAROSCOPIC APPENDECTOMY N/A 12/09/2021   Procedure: APPENDECTOMY LAPAROSCOPIC;  Surgeon: Ileana Roup, MD;  Location: [facility];  Service: General;  Laterality: N/A;   LUMBAR DISC SURGERY     MODIFIED RADICAL MASTECTOMY W/ AXILLARY LYMPH NODE DISSECTION     Left   RECONSTRUCTION BREAST W/ LATISSIMUS DORSI FLAP     left   REDUCTION MAMMAPLASTY Right    right foot surgery     corrected hammer toe, straightened big toe   SEPTOPLASTY     TOE SURGERY     TONSILLECTOMY AND ADENOIDECTOMY     Patient Active Problem List   Diagnosis Date Noted   Acute appendicitis 12/09/2021   S/P laparoscopic appendectomy 12/09/2021   CAP (community acquired pneumonia) 09/30/2020   Chronic respiratory failure with hypoxia ([place]) 09/23/2020   Allergic rhinitis 08/31/2019   Pilar cyst 08/04/2018   History of breast cancer in female 06/09/2018   History of reconstruction of both breasts 06/09/2018   Severe obesity (  BMI >= 40) ([place]) 09/16/2015   Asthma with acute exacerbation 09/15/2015   DOE (dyspnea on exertion) 01/11/2014   Insomnia 05/05/2012   Osteoarthritis    Osteopenia    Collagenous colitis    OSA (obstructive sleep apnea)    Hypothyroidism    Mild persistent asthma    Hypertension    Breast cancer, left breast ([place])     REFERRING DIAG: s/p left knee scope  THERAPY DIAG:  Chronic pain of left knee  Muscle weakness (generalized)  Other abnormalities of gait and mobility  Rationale for Evaluation and Treatment Rehabilitation  PERTINENT HISTORY: Lt knee partial lateral meniscectomy on 02/23/22, breast cx, anxiety, asthma, compression fx, osteopenia  PRECAUTIONS: Falls  SUBJECTIVE: Pt reports continued 4/10 Lt knee pain today. She attributes this to being very active for three days in a  row.   PAIN:  Are you having pain? Yes: NPRS scale: 4/10 Pain location: anterior Lt knee Pain description: sharp Aggravating factors: prolonged standing/walking, activity in general Relieving factors: rest with pillow under the knee   OBJECTIVE: (objective measures completed at initial evaluation unless otherwise dated)   DIAGNOSTIC FINDINGS:  Lt knee MRI: IMPRESSION: 1. Advanced tricompartmental osteoarthritis, most severe within the lateral compartment. 2. Advanced degeneration and tearing throughout the lateral meniscus. 3. Small joint effusion with multiple intra-articular loose bodies.   PATIENT SURVEYS:  FOTO 37% function to 52% predicted  04/22/2022: 36%   COGNITION:           Overall cognitive status: Within functional limits for tasks assessed                          SENSATION: Not tested   EDEMA:  No obvious swelling about the Lt knee      POSTURE: rounded shoulders and forward head   PALPATION: TTP lateral joint line    LOWER EXTREMITY ROM:   Active ROM Right eval Left eval Right 04/09/2022 Left 04/09/2022 Left 04/15/2022 Left 04/22/2022  Hip flexion          Hip extension          Hip abduction          Hip adduction          Hip internal rotation          Hip external rotation          Knee flexion 120 109 113 95, 102 following  AAROM 101, 107 following AAROM 104, 109 following AAROM  Knee extension Unable to accurately assess due to hip flexor tightness  Unable to accurately assess due to hip flexor tightness  -7 -20    Ankle dorsiflexion          Ankle plantarflexion          Ankle inversion          Ankle eversion           (Blank rows = not tested)   LOWER EXTREMITY MMT:   MMT Right eval Left eval Left 04/22/2022  Hip flexion 4+ 4   Hip extension       Hip abduction       Hip adduction       Hip internal rotation       Hip external rotation       Knee flexion 5 4+ 5/5  Knee extension 5 3- 5/5  Ankle dorsiflexion        Ankle plantarflexion  Ankle inversion       Ankle eversion        (Blank rows = not tested)   LOWER EXTREMITY SPECIAL TESTS:  None    FUNCTIONAL TESTS:  5 x STS 14.65 seconds TUG 23.8 seconds   04/22/2022: TUG: 16 seconds 5xSTS: 12.5 seconds   GAIT: Distance walked: 10 ft  Assistive device utilized: Single point cane Level of assistance: Modified independence Comments: limited knee flexion during stance and swing; WBOS       TODAY'S TREATMENT:  [facility] Adult PT Treatment:                                                DATE: 04/27/2022 Therapeutic Exercise: Supine Lt knee AAROM with green strap 2x10 with 5-sec hold, PT assistance PRN Supine Lt hamstring stretch with green strap x6mn Squat with UE support with goal to touch butt to table 3x8 Seated marching with subsequent knee extension 2x10 BIL Standing hamstring curls with YTB around ankle 2x10 on Rt Manual Therapy: N/A Neuromuscular re-ed: N/A Therapeutic Activity: N/A Modalities: N/A Self Care: N/A   [facility] Adult PT Treatment:                                                DATE: 04/23/2022 Therapeutic Exercise: Standing Romanian deadlift with 15# kettlebell 2x8 Supine bicycles 3x12 Supine Lt knee AAROM with green strap 2x10 with 5-sec hold, PT assistance PRN Supine Lt hamstring stretch with green strap x244m Standing hamstring curls with 7# cable to ankle 2x10 BIL Standing Cybex hip abduction with 25# 2x10 BIL Manual Therapy: N/A Neuromuscular re-ed: N/A Therapeutic Activity: N/A Modalities: N/A Self Care: N/A   [facility] Adult PT Treatment:                                                DATE: 04/22/2022 Therapeutic Exercise: Supine Lt knee AAROM with green strap 2x10 with 5-sec hold, PT assistance PRN Supine Lt knee SLR with abduction with 2.5# ankle weight 2x10 Supine bicycles 3x12 Seated LAQ with YTB 2x10 BIL Manual Therapy: N/A Neuromuscular re-ed: N/A Therapeutic  Activity: Re-administration of FOTO with pt education Re-assessment of objective measures with pt education Modalities: N/A Self Care: N/A      PATIENT EDUCATION:  Education details: see treatment  Person educated: Patient Education method: Explanation, Demonstration, Tactile cues, Verbal cues, and Handouts Education comprehension: verbalized understanding, returned demonstration, verbal cues required, tactile cues required, and needs further education     HOME EXERCISE PROGRAM: Access Code: [email or web address]: [email or web address] Date: 04/01/2022 Prepared by: SaGwendolyn Grant Exercises - Standing March with Counter Support  - 2 x daily - 7 x weekly - 2 sets - 10 reps - Supine Heel Slide  - 2 x daily - 7 x weekly - 1 sets - 10 reps - 5 sec  hold - Supine Quad Set  - 2 x daily - 7 x weekly - 2 sets - 10 reps - 5 sec  hold - Seated Knee Extension AAROM  - 2 x daily - 7 x weekly - 1 sets -  10 reps - 5 sec  hold  Added 04/22/2022: - Seated Long Arc Quad  - 1 x daily - 7 x weekly - 3 sets - 10 reps - 3 seconds hold - Supine Core Bicycle  - 1 x daily - 7 x weekly - 3 sets - 12 reps   ASSESSMENT:   CLINICAL IMPRESSION: Pt continues to benefit from skilled PT, demonstrating good form and mild pain throughout the session. She will continue to benefit from skilled PT to address her primary impairments and return to her prior level of function with less limitation.      OBJECTIVE IMPAIRMENTS Abnormal gait, decreased activity tolerance, decreased balance, difficulty walking, decreased ROM, decreased strength, improper body mechanics, postural dysfunction, and pain.    ACTIVITY LIMITATIONS carrying, lifting, bending, standing, squatting, stairs, transfers, and locomotion level   PARTICIPATION LIMITATIONS: meal prep, cleaning, laundry, shopping, and community activity   PERSONAL FACTORS Age, Fitness, Time since onset of injury/illness/exacerbation, and 3+ comorbidities: see  PMH above  are also affecting patient's functional outcome.        GOALS: Goals reviewed with patient? Yes   SHORT TERM GOALS: Target date: 04/23/22 Patient will be independent and compliant with initial HEP.    Baseline: issued at eval  04/15/2022: Pt reports adherence to her HEP Goal status: ACHIEVED   2.  Patient will demonstrate at least 120 degrees of Lt knee flexion AROM to improve gait mechanics.  Baseline: see above  04/15/2022: 107d flexion following AAROM 04/22/2022: 109d flexion following AAROM Goal status: IN PROGRESS   3.  Patient will complete TUG in </= 18 seconds to signify a reduction in fall risk.  Baseline: see above  04/21/2022: 16 seconds Goal status: ACHIEVED     LONG TERM GOALS: Target date: 05/14/22   Patient will demonstrate at least 4/5 Lt knee extension strength to improve ability to complete stair and curb negotiation.  Baseline: see above  04/22/2022: 5/5 Goal status: ACHIEVED   2.  Patient will report pain at worst rated as </=5/10 to reduce her current functional limitations.  Baseline: 8/10  04/22/2022: 5/10 worst pain Goal status: ACHIEVED   3.  Patient will be able to participate in community activity independently.  Baseline: unable to attend community events independently at this time due to pain.  04/22/2022: Pt reports ability to partake in community activity such as going to plays, but not grocery shopping Goal status: IN PROGRESS   4.  Patient will score at least 52% on FOTO to signify clinically meaningful improvement in functional abilities.    Baseline: 37 04/22/2022: 36% Goal status: IN PROGRESS     PLAN: PT FREQUENCY: 2x/week   PT DURATION: 6 weeks   PLANNED INTERVENTIONS: Therapeutic exercises, Therapeutic activity, Neuromuscular re-education, Balance training, Gait training, Patient/Family education, Self Care, Joint mobilization, Stair training, Aquatic Therapy, Dry Needling, Cryotherapy, Moist heat, Vasopneumatic  device, Manual therapy, and Re-evaluation   PLAN FOR NEXT SESSION: review and progress HEP prn; manual to knee; focus on restoring flexion, hip flexor stretching, quad strengthening.    Vanessa [place], PT, DPT 04/27/22 1:46 PM

## 2022-04-29 ENCOUNTER — Ambulatory Visit: Payer: Medicare HMO

## 2022-04-29 DIAGNOSIS — M6281 Muscle weakness (generalized): Secondary | ICD-10-CM

## 2022-04-29 DIAGNOSIS — G8929 Other chronic pain: Secondary | ICD-10-CM

## 2022-04-29 DIAGNOSIS — M25562 Pain in left knee: Secondary | ICD-10-CM | POA: Diagnosis not present

## 2022-04-29 DIAGNOSIS — R2689 Other abnormalities of gait and mobility: Secondary | ICD-10-CM

## 2022-04-29 NOTE — Therapy (Signed)
OUTPATIENT PHYSICAL THERAPY TREATMENT NOTE   Patient Name: [name] MRN: [id number] DOB:[birth year], 81 y.o., female Today's Date: 04/29/2022  PCP: [name] REFERRING PROVIDER: Frederik Pear, MD  END OF SESSION:   PT End of Session - 04/29/22 1303     Visit Number 9    Number of Visits 13    Date for PT Re-Evaluation 05/15/22    Authorization Type [employer] MCR    Authorization Time Period FOTO V6, V10, kx mod v15    Progress Note Due on Visit 10    PT Start Time 1304    PT Stop Time 1344    PT Time Calculation (min) 40 min    Activity Tolerance Patient tolerated treatment well    Behavior During Therapy WFL for tasks assessed/performed                    Past Medical History:  Diagnosis Date   Anemia    PMH;  Patient denies this dx as of 06/03/21   Anxiety    Asthma    Breast cancer ([place]) 1988   left breast cancer   Collagenous colitis    Compression fracture    lumbar 1   Dyspnea    with exertion   Hypertension    Hypothyroidism    Osteoarthritis    Osteopenia    Pneumonia 07/2020   PONV (postoperative nausea and vomiting)    Sleep apnea    wears CPAP nightly   Past Surgical History:  Procedure Laterality Date   BREAST REDUCTION SURGERY     Right   CATARACT EXTRACTION W/ INTRAOCULAR LENS  IMPLANT, BILATERAL     CESAREAN SECTION     COLONOSCOPY W/ BIOPSIES AND POLYPECTOMY     CYST EXCISION N/A 08/30/2018   Procedure: excision of posterior shoulder / back sebaceous cyst;  Surgeon: Wallace Going, DO;  Location: [facility];  Service: Plastics;  Laterality: N/A;   KNEE ARTHROSCOPY Left 02/23/2022   Procedure: LEFT KNEE ARTHROSCOPY;  Surgeon: Melrose Nakayama, MD;  Location: [facility];  Service: Orthopedics;  Laterality: Left;   KYPHOPLASTY N/A 02/18/2016   Procedure: LUMBAR 1 KYPHOPLASTY;  Surgeon: Phylliss Bob, MD;  Location: [facility];  Service: Orthopedics;  Laterality: N/A;  LUMBAR 1 KYPHOPLASTY   KYPHOPLASTY N/A  06/04/2021   Procedure: LUMBAR 2 KYPHOPLASTY;  Surgeon: Phylliss Bob, MD;  Location: [facility];  Service: Orthopedics;  Laterality: N/A;   LAPAROSCOPIC APPENDECTOMY N/A 12/09/2021   Procedure: APPENDECTOMY LAPAROSCOPIC;  Surgeon: Ileana Roup, MD;  Location: [facility];  Service: General;  Laterality: N/A;   LUMBAR DISC SURGERY     MODIFIED RADICAL MASTECTOMY W/ AXILLARY LYMPH NODE DISSECTION     Left   RECONSTRUCTION BREAST W/ LATISSIMUS DORSI FLAP     left   REDUCTION MAMMAPLASTY Right    right foot surgery     corrected hammer toe, straightened big toe   SEPTOPLASTY     TOE SURGERY     TONSILLECTOMY AND ADENOIDECTOMY     Patient Active Problem List   Diagnosis Date Noted   Acute appendicitis 12/09/2021   S/P laparoscopic appendectomy 12/09/2021   CAP (community acquired pneumonia) 09/30/2020   Chronic respiratory failure with hypoxia ([place]) 09/23/2020   Allergic rhinitis 08/31/2019   Pilar cyst 08/04/2018   History of breast cancer in female 06/09/2018   History of reconstruction of both breasts 06/09/2018   Severe obesity (BMI >= 40) ([place]) 09/16/2015   Asthma  with acute exacerbation 09/15/2015   DOE (dyspnea on exertion) 01/11/2014   Insomnia 05/05/2012   Osteoarthritis    Osteopenia    Collagenous colitis    OSA (obstructive sleep apnea)    Hypothyroidism    Mild persistent asthma    Hypertension    Breast cancer, left breast ([facility])     REFERRING DIAG: s/p left knee scope  THERAPY DIAG:  Chronic pain of left knee  Muscle weakness (generalized)  Other abnormalities of gait and mobility  Rationale for Evaluation and Treatment Rehabilitation  PERTINENT HISTORY: Lt knee partial lateral meniscectomy on 02/23/22, breast cx, anxiety, asthma, compression fx, osteopenia  PRECAUTIONS: Falls  SUBJECTIVE: Pt reports increased Lt knee pain last night that affected her sleep. However, pt reports she is able to sit on the toilet much more easily than before. Additionally,  she has been approved for "gel injections" for her Lt knee.   PAIN:  Are you having pain? Yes: NPRS scale: 3-4/10 Pain location: anterior Lt knee Pain description: sharp Aggravating factors: prolonged standing/walking, activity in general Relieving factors: rest with pillow under the knee   OBJECTIVE: (objective measures completed at initial evaluation unless otherwise dated)   DIAGNOSTIC FINDINGS:  Lt knee MRI: IMPRESSION: 1. Advanced tricompartmental osteoarthritis, most severe within the lateral compartment. 2. Advanced degeneration and tearing throughout the lateral meniscus. 3. Small joint effusion with multiple intra-articular loose bodies.   PATIENT SURVEYS:  FOTO 37% function to 52% predicted  04/22/2022: 36%   COGNITION:           Overall cognitive status: Within functional limits for tasks assessed                          SENSATION: Not tested   EDEMA:  No obvious swelling about the Lt knee      POSTURE: rounded shoulders and forward head   PALPATION: TTP lateral joint line    LOWER EXTREMITY ROM:   Active ROM Right eval Left eval Right 04/09/2022 Left 04/09/2022 Left 04/15/2022 Left 04/22/2022 Left 04/29/2022  Hip flexion           Hip extension           Hip abduction           Hip adduction           Hip internal rotation           Hip external rotation           Knee flexion 120 109 113 95, 102 following  AAROM 101, 107 following AAROM 104, 109 following AAROM 113 AROM, 115 PROM  Knee extension Unable to accurately assess due to hip flexor tightness  Unable to accurately assess due to hip flexor tightness  -7 -20     Ankle dorsiflexion           Ankle plantarflexion           Ankle inversion           Ankle eversion            (Blank rows = not tested)   LOWER EXTREMITY MMT:   MMT Right eval Left eval Left 04/22/2022  Hip flexion 4+ 4   Hip extension       Hip abduction       Hip adduction       Hip internal rotation       Hip  external rotation  Knee flexion 5 4+ 5/5  Knee extension 5 3- 5/5  Ankle dorsiflexion       Ankle plantarflexion       Ankle inversion       Ankle eversion        (Blank rows = not tested)   LOWER EXTREMITY SPECIAL TESTS:  None    FUNCTIONAL TESTS:  5 x STS 14.65 seconds TUG 23.8 seconds   04/22/2022: TUG: 16 seconds 5xSTS: 12.5 seconds   GAIT: Distance walked: 10 ft  Assistive device utilized: Single point cane Level of assistance: Modified independence Comments: limited knee flexion during stance and swing; WBOS       TODAY'S TREATMENT:  [facility] Adult PT Treatment:                                                DATE: 04/29/2022 Therapeutic Exercise: Bridge with two marches 2x10 Supine Lt knee AAROM with green strap 2x10 with 5-sec hold, PT assistance PRN 10# kettlebell deadlift with mirror feedback 2x8 Seated Lt hamstring curl with YTB 2x10 with 3-sec hold Seated Lt hamstring stretch x34mn Manual Therapy: N/A Neuromuscular re-ed: N/A Therapeutic Activity: N/A Modalities: N/A Self Care: N/A   [facility] Adult PT Treatment:                                                DATE: 04/27/2022 Therapeutic Exercise: Supine Lt knee AAROM with green strap 2x10 with 5-sec hold, PT assistance PRN Supine Lt hamstring stretch with green strap x184m Squat with UE support with goal to touch butt to table 3x8 Seated marching with subsequent knee extension 2x10 BIL Standing hamstring curls with YTB around ankle 2x10 on Rt Manual Therapy: N/A Neuromuscular re-ed: N/A Therapeutic Activity: N/A Modalities: N/A Self Care: N/A   [facility] Adult PT Treatment:                                                DATE: 04/23/2022 Therapeutic Exercise: Standing Romanian deadlift with 15# kettlebell 2x8 Supine bicycles 3x12 Supine Lt knee AAROM with green strap 2x10 with 5-sec hold, PT assistance PRN Supine Lt hamstring stretch with green strap x2m70mStanding hamstring curls with 7#  cable to ankle 2x10 BIL Standing Cybex hip abduction with 25# 2x10 BIL Manual Therapy: N/A Neuromuscular re-ed: N/A Therapeutic Activity: N/A Modalities: N/A Self Care: N/A       PATIENT EDUCATION:  Education details: see treatment  Person educated: Patient Education method: Explanation, Demonstration, Tactile cues, Verbal cues, and Handouts Education comprehension: verbalized understanding, returned demonstration, verbal cues required, tactile cues required, and needs further education     HOME EXERCISE PROGRAM: Access Code: [email or web address]: [email or web address] Date: 04/01/2022 Prepared by: SamGwendolyn GrantExercises - Standing March with Counter Support  - 2 x daily - 7 x weekly - 2 sets - 10 reps - Supine Heel Slide  - 2 x daily - 7 x weekly - 1 sets - 10 reps - 5 sec  hold - Supine Quad Set  - 2 x daily - 7 x weekly - 2 sets - 10  reps - 5 sec  hold - Seated Knee Extension AAROM  - 2 x daily - 7 x weekly - 1 sets - 10 reps - 5 sec  hold  Added 04/22/2022: - Seated Long Arc Quad  - 1 x daily - 7 x weekly - 3 sets - 10 reps - 3 seconds hold - Supine Core Bicycle  - 1 x daily - 7 x weekly - 3 sets - 12 reps   ASSESSMENT:   CLINICAL IMPRESSION: Pt responded well to all progressed exercises today, demonstrating good form and no increase in pain throughout the session. She continues to demonstrate improved knee flexion AROM. She will continue to benefit from skilled PT to address her primary impairments and return to her prior level of function with less limitation.      OBJECTIVE IMPAIRMENTS Abnormal gait, decreased activity tolerance, decreased balance, difficulty walking, decreased ROM, decreased strength, improper body mechanics, postural dysfunction, and pain.    ACTIVITY LIMITATIONS carrying, lifting, bending, standing, squatting, stairs, transfers, and locomotion level   PARTICIPATION LIMITATIONS: meal prep, cleaning, laundry, shopping, and community  activity   PERSONAL FACTORS Age, Fitness, Time since onset of injury/illness/exacerbation, and 3+ comorbidities: see PMH above  are also affecting patient's functional outcome.        GOALS: Goals reviewed with patient? Yes   SHORT TERM GOALS: Target date: 04/23/22 Patient will be independent and compliant with initial HEP.    Baseline: issued at eval  04/15/2022: Pt reports adherence to her HEP Goal status: ACHIEVED   2.  Patient will demonstrate at least 120 degrees of Lt knee flexion AROM to improve gait mechanics.  Baseline: see above  04/15/2022: 107d flexion following AAROM 04/22/2022: 109d flexion following AAROM 04/29/2022: 113d flexion AROM Goal status: IN PROGRESS   3.  Patient will complete TUG in </= 18 seconds to signify a reduction in fall risk.  Baseline: see above  04/21/2022: 16 seconds Goal status: ACHIEVED     LONG TERM GOALS: Target date: 05/14/22   Patient will demonstrate at least 4/5 Lt knee extension strength to improve ability to complete stair and curb negotiation.  Baseline: see above  04/22/2022: 5/5 Goal status: ACHIEVED   2.  Patient will report pain at worst rated as </=5/10 to reduce her current functional limitations.  Baseline: 8/10  04/22/2022: 5/10 worst pain Goal status: ACHIEVED   3.  Patient will be able to participate in community activity independently.  Baseline: unable to attend community events independently at this time due to pain.  04/22/2022: Pt reports ability to partake in community activity such as going to plays, but not grocery shopping Goal status: IN PROGRESS   4.  Patient will score at least 52% on FOTO to signify clinically meaningful improvement in functional abilities.    Baseline: 37 04/22/2022: 36% Goal status: IN PROGRESS     PLAN: PT FREQUENCY: 2x/week   PT DURATION: 6 weeks   PLANNED INTERVENTIONS: Therapeutic exercises, Therapeutic activity, Neuromuscular re-education, Balance training, Gait  training, Patient/Family education, Self Care, Joint mobilization, Stair training, Aquatic Therapy, Dry Needling, Cryotherapy, Moist heat, Vasopneumatic device, Manual therapy, and Re-evaluation   PLAN FOR NEXT SESSION: review and progress HEP prn; manual to knee; focus on restoring flexion, hip flexor stretching, quad strengthening.    Vanessa [place], PT, DPT 04/29/22 1:44 PM

## 2022-05-04 ENCOUNTER — Ambulatory Visit: Payer: Medicare HMO

## 2022-05-04 DIAGNOSIS — G8929 Other chronic pain: Secondary | ICD-10-CM

## 2022-05-04 DIAGNOSIS — M6281 Muscle weakness (generalized): Secondary | ICD-10-CM

## 2022-05-04 DIAGNOSIS — R2689 Other abnormalities of gait and mobility: Secondary | ICD-10-CM

## 2022-05-04 DIAGNOSIS — M25562 Pain in left knee: Secondary | ICD-10-CM | POA: Diagnosis not present

## 2022-05-04 NOTE — Therapy (Signed)
OUTPATIENT PHYSICAL THERAPY TREATMENT NOTE   Patient Name: [name] MRN: [id number] DOB:07-09-1940, 81 y.o., female Today's Date: 05/04/2022  PCP: [name] REFERRING PROVIDER: Frederik Pear, MD  END OF SESSION:   PT End of Session - 05/04/22 1401     Visit Number 10    Number of Visits 13    Date for PT Re-Evaluation 05/15/22    Authorization Type [employer] MCR    Authorization Time Period FOTO V6, V10, kx mod v15    Progress Note Due on Visit 10    PT Start Time 1402    PT Stop Time 1442    PT Time Calculation (min) 40 min    Equipment Utilized During Treatment Gait belt    Activity Tolerance Patient tolerated treatment well    Behavior During Therapy WFL for tasks assessed/performed                     Past Medical History:  Diagnosis Date   Anemia    PMH;  Patient denies this dx as of 06/03/21   Anxiety    Asthma    Breast cancer ([place]) 1988   left breast cancer   Collagenous colitis    Compression fracture    lumbar 1   Dyspnea    with exertion   Hypertension    Hypothyroidism    Osteoarthritis    Osteopenia    Pneumonia 07/2020   PONV (postoperative nausea and vomiting)    Sleep apnea    wears CPAP nightly   Past Surgical History:  Procedure Laterality Date   BREAST REDUCTION SURGERY     Right   CATARACT EXTRACTION W/ INTRAOCULAR LENS  IMPLANT, BILATERAL     CESAREAN SECTION     COLONOSCOPY W/ BIOPSIES AND POLYPECTOMY     CYST EXCISION N/A 08/30/2018   Procedure: excision of posterior shoulder / back sebaceous cyst;  Surgeon: Wallace Going, DO;  Location: [place];  Service: Plastics;  Laterality: N/A;   KNEE ARTHROSCOPY Left 02/23/2022   Procedure: LEFT KNEE ARTHROSCOPY;  Surgeon: Melrose Nakayama, MD;  Location: [facility];  Service: Orthopedics;  Laterality: Left;   KYPHOPLASTY N/A 02/18/2016   Procedure: LUMBAR 1 KYPHOPLASTY;  Surgeon: Phylliss Bob, MD;  Location: [place];  Service: Orthopedics;   Laterality: N/A;  LUMBAR 1 KYPHOPLASTY   KYPHOPLASTY N/A 06/04/2021   Procedure: LUMBAR 2 KYPHOPLASTY;  Surgeon: Phylliss Bob, MD;  Location: [place];  Service: Orthopedics;  Laterality: N/A;   LAPAROSCOPIC APPENDECTOMY N/A 12/09/2021   Procedure: APPENDECTOMY LAPAROSCOPIC;  Surgeon: Ileana Roup, MD;  Location: [place];  Service: General;  Laterality: N/A;   LUMBAR DISC SURGERY     MODIFIED RADICAL MASTECTOMY W/ AXILLARY LYMPH NODE DISSECTION     Left   RECONSTRUCTION BREAST W/ LATISSIMUS DORSI FLAP     left   REDUCTION MAMMAPLASTY Right    right foot surgery     corrected hammer toe, straightened big toe   SEPTOPLASTY     TOE SURGERY     TONSILLECTOMY AND ADENOIDECTOMY     Patient Active Problem List   Diagnosis Date Noted   Acute appendicitis 12/09/2021   S/P laparoscopic appendectomy 12/09/2021   CAP (community acquired pneumonia) 09/30/2020   Chronic respiratory failure with hypoxia ([place]) 09/23/2020   Allergic rhinitis 08/31/2019   Pilar cyst 08/04/2018   History of breast cancer in female 06/09/2018   History of reconstruction of both breasts 06/09/2018  Severe obesity (BMI >= 40) (HCC) 09/16/2015   Asthma with acute exacerbation 09/15/2015   DOE (dyspnea on exertion) 01/11/2014   Insomnia 05/05/2012   Osteoarthritis    Osteopenia    Collagenous colitis    OSA (obstructive sleep apnea)    Hypothyroidism    Mild persistent asthma    Hypertension    Breast cancer, left breast (HCC)     REFERRING DIAG: s/p left knee scope  THERAPY DIAG:  Chronic pain of left knee  Muscle weakness (generalized)  Other abnormalities of gait and mobility  Rationale for Evaluation and Treatment Rehabilitation  PERTINENT HISTORY: Lt knee partial lateral meniscectomy on 02/23/22, breast cx, anxiety, asthma, compression fx, osteopenia  PRECAUTIONS: Falls  SUBJECTIVE: Pt rates her Lt knee pain as 5/10 today, which she attributes to the weather change. She reports adherence to  her HEP.   PAIN:  Are you having pain? Yes: NPRS scale: 5/10 Pain location: anterior Lt knee Pain description: sharp Aggravating factors: prolonged standing/walking, activity in general Relieving factors: rest with pillow under the knee   OBJECTIVE: (objective measures completed at initial evaluation unless otherwise dated)   DIAGNOSTIC FINDINGS:  Lt knee MRI: IMPRESSION: 1. Advanced tricompartmental osteoarthritis, most severe within the lateral compartment. 2. Advanced degeneration and tearing throughout the lateral meniscus. 3. Small joint effusion with multiple intra-articular loose bodies.   PATIENT SURVEYS:  FOTO 37% function to 52% predicted  04/22/2022: 36%  05/04/2022: 33%   COGNITION:           Overall cognitive status: Within functional limits for tasks assessed                          SENSATION: Not tested   EDEMA:  No obvious swelling about the Lt knee      POSTURE: rounded shoulders and forward head   PALPATION: TTP lateral joint line    LOWER EXTREMITY ROM:   Active ROM Right eval Left eval Right 04/09/2022 Left 04/09/2022 Left 04/15/2022 Left 04/22/2022 Left 04/29/2022  Hip flexion           Hip extension           Hip abduction           Hip adduction           Hip internal rotation           Hip external rotation           Knee flexion 120 109 113 95, 102 following  AAROM 101, 107 following AAROM 104, 109 following AAROM 113 AROM, 115 PROM  Knee extension Unable to accurately assess due to hip flexor tightness  Unable to accurately assess due to hip flexor tightness  -7 -20     Ankle dorsiflexion           Ankle plantarflexion           Ankle inversion           Ankle eversion            (Blank rows = not tested)   LOWER EXTREMITY MMT:   MMT Right eval Left eval Left 04/22/2022  Hip flexion 4+ 4   Hip extension       Hip abduction       Hip adduction       Hip internal rotation       Hip external rotation       Knee  flexion 5 4+  5/5  Knee extension 5 3- 5/5  Ankle dorsiflexion       Ankle plantarflexion       Ankle inversion       Ankle eversion        (Blank rows = not tested)   LOWER EXTREMITY SPECIAL TESTS:  None    FUNCTIONAL TESTS:  5 x STS 14.65 seconds TUG 23.8 seconds   04/22/2022: TUG: 16 seconds 5xSTS: 12.5 seconds   GAIT: Distance walked: 10 ft  Assistive device utilized: Single point cane Level of assistance: Modified independence Comments: limited knee flexion during stance and swing; WBOS       TODAY'S TREATMENT:  [facility] Adult PT Treatment:                                                DATE: 05/04/2022 Therapeutic Exercise: Stationary bike with progressive lowering x5 minutes while collecting subjective information Prone quad stretch with strap by PT x2mn Supine Lt knee AAROM with green strap 2x10 with 5-sec hold, PT assistance PRN 3# Pallof press with marching 2x20 BIL Seated BIL knee extension machine with 10# 2x10 with 3-sec holds Manual Therapy: N/A Neuromuscular re-ed: N/A Therapeutic Activity: Re-administration of FOTO with pt education Modalities: N/A Self Care: N/A   [facility] Adult PT Treatment:                                                DATE: 04/29/2022 Therapeutic Exercise: Bridge with two marches 2x10 Supine Lt knee AAROM with green strap 2x10 with 5-sec hold, PT assistance PRN 10# kettlebell deadlift with mirror feedback 2x8 Seated Lt hamstring curl with YTB 2x10 with 3-sec hold Seated Lt hamstring stretch x279m Manual Therapy: N/A Neuromuscular re-ed: N/A Therapeutic Activity: N/A Modalities: N/A Self Care: N/A   [facility] Adult PT Treatment:                                                DATE: 04/27/2022 Therapeutic Exercise: Supine Lt knee AAROM with green strap 2x10 with 5-sec hold, PT assistance PRN Supine Lt hamstring stretch with green strap x1m25mSquat with UE support with goal to touch butt to table 3x8 Seated marching with  subsequent knee extension 2x10 BIL Standing hamstring curls with YTB around ankle 2x10 on Rt Manual Therapy: N/A Neuromuscular re-ed: N/A Therapeutic Activity: N/A Modalities: N/A Self Care: N/A       PATIENT EDUCATION:  Education details: see treatment  Person educated: Patient Education method: Explanation, Demonstration, Tactile cues, Verbal cues, and Handouts Education comprehension: verbalized understanding, returned demonstration, verbal cues required, tactile cues required, and needs further education     HOME EXERCISE PROGRAM: Access Code: [email or web address]: [email or web address] Date: 04/01/2022 Prepared by: SamGwendolyn GrantExercises - Standing March with Counter Support  - 2 x daily - 7 x weekly - 2 sets - 10 reps - Supine Heel Slide  - 2 x daily - 7 x weekly - 1 sets - 10 reps - 5 sec  hold - Supine Quad Set  - 2 x daily - 7 x weekly - 2 sets -  10 reps - 5 sec  hold - Seated Knee Extension AAROM  - 2 x daily - 7 x weekly - 1 sets - 10 reps - 5 sec  hold  Added 04/22/2022: - Seated Long Arc Quad  - 1 x daily - 7 x weekly - 3 sets - 10 reps - 3 seconds hold - Supine Core Bicycle  - 1 x daily - 7 x weekly - 3 sets - 12 reps   ASSESSMENT:   CLINICAL IMPRESSION: Pt continues to progress well with all interventions, demonstrating good form and no pain with exercises. She will continue to benefit from skilled PT to address her primary impairments and return to her prior level of function with less limitation.     OBJECTIVE IMPAIRMENTS Abnormal gait, decreased activity tolerance, decreased balance, difficulty walking, decreased ROM, decreased strength, improper body mechanics, postural dysfunction, and pain.    ACTIVITY LIMITATIONS carrying, lifting, bending, standing, squatting, stairs, transfers, and locomotion level   PARTICIPATION LIMITATIONS: meal prep, cleaning, laundry, shopping, and community activity   PERSONAL FACTORS Age, Fitness, Time since  onset of injury/illness/exacerbation, and 3+ comorbidities: see PMH above  are also affecting patient's functional outcome.        GOALS: Goals reviewed with patient? Yes   SHORT TERM GOALS: Target date: 04/23/22 Patient will be independent and compliant with initial HEP.    Baseline: issued at eval  04/15/2022: Pt reports adherence to her HEP Goal status: ACHIEVED   2.  Patient will demonstrate at least 120 degrees of Lt knee flexion AROM to improve gait mechanics.  Baseline: see above  04/15/2022: 107d flexion following AAROM 04/22/2022: 109d flexion following AAROM 04/29/2022: 113d flexion AROM Goal status: IN PROGRESS   3.  Patient will complete TUG in </= 18 seconds to signify a reduction in fall risk.  Baseline: see above  04/21/2022: 16 seconds Goal status: ACHIEVED     LONG TERM GOALS: Target date: 05/14/22   Patient will demonstrate at least 4/5 Lt knee extension strength to improve ability to complete stair and curb negotiation.  Baseline: see above  04/22/2022: 5/5 Goal status: ACHIEVED   2.  Patient will report pain at worst rated as </=5/10 to reduce her current functional limitations.  Baseline: 8/10  04/22/2022: 5/10 worst pain Goal status: ACHIEVED   3.  Patient will be able to participate in community activity independently.  Baseline: unable to attend community events independently at this time due to pain.  04/22/2022: Pt reports ability to partake in community activity such as going to plays, but not grocery shopping Goal status: IN PROGRESS   4.  Patient will score at least 52% on FOTO to signify clinically meaningful improvement in functional abilities.    Baseline: 37 04/22/2022: 36% 05/04/2022: 33% Goal status: IN PROGRESS     PLAN: PT FREQUENCY: 2x/week   PT DURATION: 6 weeks   PLANNED INTERVENTIONS: Therapeutic exercises, Therapeutic activity, Neuromuscular re-education, Balance training, Gait training, Patient/Family education, Self  Care, Joint mobilization, Stair training, Aquatic Therapy, Dry Needling, Cryotherapy, Moist heat, Vasopneumatic device, Manual therapy, and Re-evaluation   PLAN FOR NEXT SESSION: review and progress HEP prn; manual to knee; focus on restoring flexion, hip flexor stretching, quad strengthening.    Vanessa [place], PT, DPT 05/04/22 2:44 PM

## 2022-05-06 ENCOUNTER — Ambulatory Visit: Payer: Medicare HMO | Attending: Orthopaedic Surgery

## 2022-05-06 DIAGNOSIS — R2689 Other abnormalities of gait and mobility: Secondary | ICD-10-CM | POA: Diagnosis present

## 2022-05-06 DIAGNOSIS — M6281 Muscle weakness (generalized): Secondary | ICD-10-CM | POA: Insufficient documentation

## 2022-05-06 DIAGNOSIS — G8929 Other chronic pain: Secondary | ICD-10-CM | POA: Insufficient documentation

## 2022-05-06 DIAGNOSIS — M25562 Pain in left knee: Secondary | ICD-10-CM | POA: Insufficient documentation

## 2022-05-06 NOTE — Therapy (Signed)
OUTPATIENT PHYSICAL THERAPY TREATMENT NOTE   Patient Name: [name] MRN: [id number] DOB:19-Feb-1941, 81 y.o., female Today's Date: 05/06/2022  PCP: [name] REFERRING PROVIDER: Frederik Pear, MD  END OF SESSION:   PT End of Session - 05/06/22 1303     Visit Number 11    Number of Visits 13    Date for PT Re-Evaluation 05/15/22    Authorization Type [employer] MCR    Authorization Time Period FOTO V6, V10, kx mod v15    Progress Note Due on Visit 10    PT Start Time 1303    PT Stop Time 1343    PT Time Calculation (min) 40 min    Equipment Utilized During Treatment Gait belt    Activity Tolerance Patient tolerated treatment well    Behavior During Therapy WFL for tasks assessed/performed                      Past Medical History:  Diagnosis Date   Anemia    PMH;  Patient denies this dx as of 06/03/21   Anxiety    Asthma    Breast cancer ([place]) 1988   left breast cancer   Collagenous colitis    Compression fracture    lumbar 1   Dyspnea    with exertion   Hypertension    Hypothyroidism    Osteoarthritis    Osteopenia    Pneumonia 07/2020   PONV (postoperative nausea and vomiting)    Sleep apnea    wears CPAP nightly   Past Surgical History:  Procedure Laterality Date   BREAST REDUCTION SURGERY     Right   CATARACT EXTRACTION W/ INTRAOCULAR LENS  IMPLANT, BILATERAL     CESAREAN SECTION     COLONOSCOPY W/ BIOPSIES AND POLYPECTOMY     CYST EXCISION N/A 08/30/2018   Procedure: excision of posterior shoulder / back sebaceous cyst;  Surgeon: Wallace Going, DO;  Location: [place];  Service: Plastics;  Laterality: N/A;   KNEE ARTHROSCOPY Left 02/23/2022   Procedure: LEFT KNEE ARTHROSCOPY;  Surgeon: Melrose Nakayama, MD;  Location: [facility];  Service: Orthopedics;  Laterality: Left;   KYPHOPLASTY N/A 02/18/2016   Procedure: LUMBAR 1 KYPHOPLASTY;  Surgeon: Phylliss Bob, MD;  Location: [place];  Service: Orthopedics;   Laterality: N/A;  LUMBAR 1 KYPHOPLASTY   KYPHOPLASTY N/A 06/04/2021   Procedure: LUMBAR 2 KYPHOPLASTY;  Surgeon: Phylliss Bob, MD;  Location: [place];  Service: Orthopedics;  Laterality: N/A;   LAPAROSCOPIC APPENDECTOMY N/A 12/09/2021   Procedure: APPENDECTOMY LAPAROSCOPIC;  Surgeon: Ileana Roup, MD;  Location: [place];  Service: General;  Laterality: N/A;   LUMBAR DISC SURGERY     MODIFIED RADICAL MASTECTOMY W/ AXILLARY LYMPH NODE DISSECTION     Left   RECONSTRUCTION BREAST W/ LATISSIMUS DORSI FLAP     left   REDUCTION MAMMAPLASTY Right    right foot surgery     corrected hammer toe, straightened big toe   SEPTOPLASTY     TOE SURGERY     TONSILLECTOMY AND ADENOIDECTOMY     Patient Active Problem List   Diagnosis Date Noted   Acute appendicitis 12/09/2021   S/P laparoscopic appendectomy 12/09/2021   CAP (community acquired pneumonia) 09/30/2020   Chronic respiratory failure with hypoxia ([place]) 09/23/2020   Allergic rhinitis 08/31/2019   Pilar cyst 08/04/2018   History of breast cancer in female 06/09/2018   History of reconstruction of both breasts 06/09/2018  Severe obesity (BMI >= 40) (HCC) 09/16/2015   Asthma with acute exacerbation 09/15/2015   DOE (dyspnea on exertion) 01/11/2014   Insomnia 05/05/2012   Osteoarthritis    Osteopenia    Collagenous colitis    OSA (obstructive sleep apnea)    Hypothyroidism    Mild persistent asthma    Hypertension    Breast cancer, left breast (HCC)     REFERRING DIAG: s/p left knee scope  THERAPY DIAG:  Chronic pain of left knee  Muscle weakness (generalized)  Other abnormalities of gait and mobility  Rationale for Evaluation and Treatment Rehabilitation  PERTINENT HISTORY: Lt knee partial lateral meniscectomy on 02/23/22, breast cx, anxiety, asthma, compression fx, osteopenia  PRECAUTIONS: Falls  SUBJECTIVE: Pt reports increased soreness this morning rated at 4/10, which has decreased to 2/10 after taking  hydrocodone. She reports HEP adherence.   PAIN:  Are you having pain? Yes: NPRS scale: 2/10 Pain location: anterior Lt knee Pain description: sharp Aggravating factors: prolonged standing/walking, activity in general Relieving factors: rest with pillow under the knee   OBJECTIVE: (objective measures completed at initial evaluation unless otherwise dated)   DIAGNOSTIC FINDINGS:  Lt knee MRI: IMPRESSION: 1. Advanced tricompartmental osteoarthritis, most severe within the lateral compartment. 2. Advanced degeneration and tearing throughout the lateral meniscus. 3. Small joint effusion with multiple intra-articular loose bodies.   PATIENT SURVEYS:  FOTO 37% function to 52% predicted  04/22/2022: 36%  05/04/2022: 33%   COGNITION:           Overall cognitive status: Within functional limits for tasks assessed                          SENSATION: Not tested   EDEMA:  No obvious swelling about the Lt knee      POSTURE: rounded shoulders and forward head   PALPATION: TTP lateral joint line    LOWER EXTREMITY ROM:   Active ROM Right eval Left eval Right 04/09/2022 Left 04/09/2022 Left 04/15/2022 Left 04/22/2022 Left 04/29/2022  Hip flexion           Hip extension           Hip abduction           Hip adduction           Hip internal rotation           Hip external rotation           Knee flexion 120 109 113 95, 102 following  AAROM 101, 107 following AAROM 104, 109 following AAROM 113 AROM, 115 PROM  Knee extension Unable to accurately assess due to hip flexor tightness  Unable to accurately assess due to hip flexor tightness  -7 -20     Ankle dorsiflexion           Ankle plantarflexion           Ankle inversion           Ankle eversion            (Blank rows = not tested)   LOWER EXTREMITY MMT:   MMT Right eval Left eval Left 04/22/2022  Hip flexion 4+ 4   Hip extension       Hip abduction       Hip adduction       Hip internal rotation       Hip  external rotation       Knee flexion 5 4+ 5/5  Knee extension 5 3- 5/5  Ankle dorsiflexion       Ankle plantarflexion       Ankle inversion       Ankle eversion        (Blank rows = not tested)   LOWER EXTREMITY SPECIAL TESTS:  None    FUNCTIONAL TESTS:  5 x STS 14.65 seconds TUG 23.8 seconds   04/22/2022: TUG: 16 seconds 5xSTS: 12.5 seconds   GAIT: Distance walked: 10 ft  Assistive device utilized: Single point cane Level of assistance: Modified independence Comments: limited knee flexion during stance and swing; WBOS       TODAY'S TREATMENT:  [facility] Adult PT Treatment:                                                DATE: 05/06/2022 Therapeutic Exercise: Stationary bike with progressive lowering x5 minutes while collecting subjective information Deadlift with 15# kettlebell 3x8 Sit-to-stand with alternating side step with GTB around thighs at table 3x10 Supine 90/90 with handhold resistance to thighs 3x30 seconds Seated active hamstring stretch x43mn on Lt Manual Therapy: N/A Neuromuscular re-ed: N/A Therapeutic Activity: N/A Modalities: N/A Self Care: N/A   [facility] Adult PT Treatment:                                                DATE: 05/04/2022 Therapeutic Exercise: Stationary bike with progressive lowering x5 minutes while collecting subjective information Prone quad stretch with strap by PT x240m Supine Lt knee AAROM with green strap 2x10 with 5-sec hold, PT assistance PRN 3# Pallof press with marching 2x20 BIL Seated BIL knee extension machine with 10# 2x10 with 3-sec holds Manual Therapy: N/A Neuromuscular re-ed: N/A Therapeutic Activity: Re-administration of FOTO with pt education Modalities: N/A Self Care: N/A   [facility] Adult PT Treatment:                                                DATE: 04/29/2022 Therapeutic Exercise: Bridge with two marches 2x10 Supine Lt knee AAROM with green strap 2x10 with 5-sec hold, PT assistance PRN 10# kettlebell  deadlift with mirror feedback 2x8 Seated Lt hamstring curl with YTB 2x10 with 3-sec hold Seated Lt hamstring stretch x2m53mManual Therapy: N/A Neuromuscular re-ed: N/A Therapeutic Activity: N/A Modalities: N/A Self Care: N/A       PATIENT EDUCATION:  Education details: see treatment  Person educated: Patient Education method: Explanation, Demonstration, Tactile cues, Verbal cues, and Handouts Education comprehension: verbalized understanding, returned demonstration, verbal cues required, tactile cues required, and needs further education     HOME EXERCISE PROGRAM: Access Code: [email or web address]: [email or web address] Date: 04/01/2022 Prepared by: SamGwendolyn GrantExercises - Standing March with Counter Support  - 2 x daily - 7 x weekly - 2 sets - 10 reps - Supine Heel Slide  - 2 x daily - 7 x weekly - 1 sets - 10 reps - 5 sec  hold - Supine Quad Set  - 2 x daily - 7 x weekly - 2 sets - 10 reps - 5 sec  hold - Seated  Knee Extension AAROM  - 2 x daily - 7 x weekly - 1 sets - 10 reps - 5 sec  hold  Added 04/22/2022: - Seated Long Arc Quad  - 1 x daily - 7 x weekly - 3 sets - 10 reps - 3 seconds hold - Supine Core Bicycle  - 1 x daily - 7 x weekly - 3 sets - 12 reps   ASSESSMENT:   CLINICAL IMPRESSION: Pt continues to progress well with all interventions, demonstrating good form and no pain with exercises. She demonstrates visually improved Lt knee flexion AROM following exercise bike with progressive lowering. She will continue to benefit from skilled PT to address her primary impairments and return to her prior level of function with less limitation.     OBJECTIVE IMPAIRMENTS Abnormal gait, decreased activity tolerance, decreased balance, difficulty walking, decreased ROM, decreased strength, improper body mechanics, postural dysfunction, and pain.    ACTIVITY LIMITATIONS carrying, lifting, bending, standing, squatting, stairs, transfers, and locomotion level    PARTICIPATION LIMITATIONS: meal prep, cleaning, laundry, shopping, and community activity   PERSONAL FACTORS Age, Fitness, Time since onset of injury/illness/exacerbation, and 3+ comorbidities: see PMH above  are also affecting patient's functional outcome.        GOALS: Goals reviewed with patient? Yes   SHORT TERM GOALS: Target date: 04/23/22 Patient will be independent and compliant with initial HEP.    Baseline: issued at eval  04/15/2022: Pt reports adherence to her HEP Goal status: ACHIEVED   2.  Patient will demonstrate at least 120 degrees of Lt knee flexion AROM to improve gait mechanics.  Baseline: see above  04/15/2022: 107d flexion following AAROM 04/22/2022: 109d flexion following AAROM 04/29/2022: 113d flexion AROM Goal status: IN PROGRESS   3.  Patient will complete TUG in </= 18 seconds to signify a reduction in fall risk.  Baseline: see above  04/21/2022: 16 seconds Goal status: ACHIEVED     LONG TERM GOALS: Target date: 05/14/22   Patient will demonstrate at least 4/5 Lt knee extension strength to improve ability to complete stair and curb negotiation.  Baseline: see above  04/22/2022: 5/5 Goal status: ACHIEVED   2.  Patient will report pain at worst rated as </=5/10 to reduce her current functional limitations.  Baseline: 8/10  04/22/2022: 5/10 worst pain Goal status: ACHIEVED   3.  Patient will be able to participate in community activity independently.  Baseline: unable to attend community events independently at this time due to pain.  04/22/2022: Pt reports ability to partake in community activity such as going to plays, but not grocery shopping Goal status: IN PROGRESS   4.  Patient will score at least 52% on FOTO to signify clinically meaningful improvement in functional abilities.    Baseline: 37 04/22/2022: 36% 05/04/2022: 33% Goal status: IN PROGRESS     PLAN: PT FREQUENCY: 2x/week   PT DURATION: 6 weeks   PLANNED INTERVENTIONS:  Therapeutic exercises, Therapeutic activity, Neuromuscular re-education, Balance training, Gait training, Patient/Family education, Self Care, Joint mobilization, Stair training, Aquatic Therapy, Dry Needling, Cryotherapy, Moist heat, Vasopneumatic device, Manual therapy, and Re-evaluation   PLAN FOR NEXT SESSION: review and progress HEP prn; manual to knee; focus on restoring flexion, hip flexor stretching, quad strengthening.    Vanessa [place], PT, DPT 05/06/22 1:45 PM

## 2022-05-11 ENCOUNTER — Ambulatory Visit: Payer: Medicare HMO

## 2022-05-11 DIAGNOSIS — M25562 Pain in left knee: Secondary | ICD-10-CM | POA: Diagnosis not present

## 2022-05-11 DIAGNOSIS — M6281 Muscle weakness (generalized): Secondary | ICD-10-CM

## 2022-05-11 DIAGNOSIS — G8929 Other chronic pain: Secondary | ICD-10-CM

## 2022-05-11 DIAGNOSIS — R2689 Other abnormalities of gait and mobility: Secondary | ICD-10-CM

## 2022-05-11 NOTE — Therapy (Signed)
OUTPATIENT PHYSICAL THERAPY TREATMENT NOTE   Patient Name: [name] MRN: [id number] DOB:1940-08-26, 81 y.o., female Today's Date: 05/11/2022  PCP: [name] REFERRING PROVIDER: Frederik Pear, MD  END OF SESSION:   PT End of Session - 05/11/22 1300     Visit Number 12    Number of Visits 13    Date for PT Re-Evaluation 05/15/22    Authorization Type [employer] MCR    Authorization Time Period FOTO V6, V10, kx mod v15    Progress Note Due on Visit 10    PT Start Time 1300    PT Stop Time 1340    PT Time Calculation (min) 40 min    Equipment Utilized During Treatment Gait belt    Activity Tolerance Patient tolerated treatment well    Behavior During Therapy WFL for tasks assessed/performed                       Past Medical History:  Diagnosis Date   Anemia    PMH;  Patient denies this dx as of 06/03/21   Anxiety    Asthma    Breast cancer ([place]) 1988   left breast cancer   Collagenous colitis    Compression fracture    lumbar 1   Dyspnea    with exertion   Hypertension    Hypothyroidism    Osteoarthritis    Osteopenia    Pneumonia 07/2020   PONV (postoperative nausea and vomiting)    Sleep apnea    wears CPAP nightly   Past Surgical History:  Procedure Laterality Date   BREAST REDUCTION SURGERY     Right   CATARACT EXTRACTION W/ INTRAOCULAR LENS  IMPLANT, BILATERAL     CESAREAN SECTION     COLONOSCOPY W/ BIOPSIES AND POLYPECTOMY     CYST EXCISION N/A 08/30/2018   Procedure: excision of posterior shoulder / back sebaceous cyst;  Surgeon: Wallace Going, DO;  Location: [facility];  Service: Plastics;  Laterality: N/A;   KNEE ARTHROSCOPY Left 02/23/2022   Procedure: LEFT KNEE ARTHROSCOPY;  Surgeon: Melrose Nakayama, MD;  Location: [facility];  Service: Orthopedics;  Laterality: Left;   KYPHOPLASTY N/A 02/18/2016   Procedure: LUMBAR 1 KYPHOPLASTY;  Surgeon: Phylliss Bob, MD;  Location: [facility];  Service: Orthopedics;   Laterality: N/A;  LUMBAR 1 KYPHOPLASTY   KYPHOPLASTY N/A 06/04/2021   Procedure: LUMBAR 2 KYPHOPLASTY;  Surgeon: Phylliss Bob, MD;  Location: [facility];  Service: Orthopedics;  Laterality: N/A;   LAPAROSCOPIC APPENDECTOMY N/A 12/09/2021   Procedure: APPENDECTOMY LAPAROSCOPIC;  Surgeon: Ileana Roup, MD;  Location: [facility];  Service: General;  Laterality: N/A;   LUMBAR DISC SURGERY     MODIFIED RADICAL MASTECTOMY W/ AXILLARY LYMPH NODE DISSECTION     Left   RECONSTRUCTION BREAST W/ LATISSIMUS DORSI FLAP     left   REDUCTION MAMMAPLASTY Right    right foot surgery     corrected hammer toe, straightened big toe   SEPTOPLASTY     TOE SURGERY     TONSILLECTOMY AND ADENOIDECTOMY     Patient Active Problem List   Diagnosis Date Noted   Acute appendicitis 12/09/2021   S/P laparoscopic appendectomy 12/09/2021   CAP (community acquired pneumonia) 09/30/2020   Chronic respiratory failure with hypoxia ([place]) 09/23/2020   Allergic rhinitis 08/31/2019   Pilar cyst 08/04/2018   History of breast cancer in female 06/09/2018   History of reconstruction of both breasts 06/09/2018  Severe obesity (BMI >= 40) (HCC) 09/16/2015   Asthma with acute exacerbation 09/15/2015   DOE (dyspnea on exertion) 01/11/2014   Insomnia 05/05/2012   Osteoarthritis    Osteopenia    Collagenous colitis    OSA (obstructive sleep apnea)    Hypothyroidism    Mild persistent asthma    Hypertension    Breast cancer, left breast ([place])     REFERRING DIAG: s/p left knee scope  THERAPY DIAG:  Chronic pain of left knee  Muscle weakness (generalized)  Other abnormalities of gait and mobility  Rationale for Evaluation and Treatment Rehabilitation  PERTINENT HISTORY: Lt knee partial lateral meniscectomy on 02/23/22, breast cx, anxiety, asthma, compression fx, osteopenia  PRECAUTIONS: Falls  SUBJECTIVE: Pt reports 2/10 Lt knee pain today, adding that she has done her HEP daily. She reports she is still having  difficulty with walking.   PAIN:  Are you having pain? Yes: NPRS scale: 2/10 Pain location: anterior Lt knee Pain description: sharp Aggravating factors: prolonged standing/walking, activity in general Relieving factors: rest with pillow under the knee   OBJECTIVE: (objective measures completed at initial evaluation unless otherwise dated)   DIAGNOSTIC FINDINGS:  Lt knee MRI: IMPRESSION: 1. Advanced tricompartmental osteoarthritis, most severe within the lateral compartment. 2. Advanced degeneration and tearing throughout the lateral meniscus. 3. Small joint effusion with multiple intra-articular loose bodies.   PATIENT SURVEYS:  FOTO 37% function to 52% predicted  04/22/2022: 36%  05/04/2022: 33%   COGNITION:           Overall cognitive status: Within functional limits for tasks assessed                          SENSATION: Not tested   EDEMA:  No obvious swelling about the Lt knee      POSTURE: rounded shoulders and forward head   PALPATION: TTP lateral joint line    LOWER EXTREMITY ROM:   Active ROM Right eval Left eval Right 04/09/2022 Left 04/09/2022 Left 04/15/2022 Left 04/22/2022 Left 04/29/2022  Hip flexion           Hip extension           Hip abduction           Hip adduction           Hip internal rotation           Hip external rotation           Knee flexion 120 109 113 95, 102 following  AAROM 101, 107 following AAROM 104, 109 following AAROM 113 AROM, 115 PROM  Knee extension Unable to accurately assess due to hip flexor tightness  Unable to accurately assess due to hip flexor tightness  -7 -20     Ankle dorsiflexion           Ankle plantarflexion           Ankle inversion           Ankle eversion            (Blank rows = not tested)   LOWER EXTREMITY MMT:   MMT Right eval Left eval Left 04/22/2022  Hip flexion 4+ 4   Hip extension       Hip abduction       Hip adduction       Hip internal rotation       Hip external rotation        Knee flexion  5 4+ 5/5  Knee extension 5 3- 5/5  Ankle dorsiflexion       Ankle plantarflexion       Ankle inversion       Ankle eversion        (Blank rows = not tested)   LOWER EXTREMITY SPECIAL TESTS:  None    FUNCTIONAL TESTS:  5 x STS 14.65 seconds TUG 23.8 seconds   04/22/2022: TUG: 16 seconds 5xSTS: 12.5 seconds   GAIT: Distance walked: 10 ft  Assistive device utilized: Single point cane Level of assistance: Modified independence Comments: limited knee flexion during stance and swing; WBOS       TODAY'S TREATMENT:  [facility] Adult PT Treatment:                                                DATE: 05/11/2022 Therapeutic Exercise: NuStep with progressive lowering into knee flexion while collecting subjective information x5 min Sit-to-stand with 5# kettlebell at chest 3x6 2-inch step-up and retro step down 2x10 BIL Seated marching with green band around thighs 2x10 with 3-sec holds BIL Standing hip abduction with GTB around thighs 3x10 Manual Therapy: N/A Neuromuscular re-ed: N/A Therapeutic Activity: N/A Modalities: N/A Self Care: N/A   [facility] Adult PT Treatment:                                                DATE: 05/06/2022 Therapeutic Exercise: Stationary bike with progressive lowering x5 minutes while collecting subjective information Deadlift with 15# kettlebell 3x8 Sit-to-stand with alternating side step with GTB around thighs at table 3x10 Supine 90/90 with handhold resistance to thighs 3x30 seconds Seated active hamstring stretch x60mn on Lt Manual Therapy: N/A Neuromuscular re-ed: N/A Therapeutic Activity: N/A Modalities: N/A Self Care: N/A   [facility] Adult PT Treatment:                                                DATE: 05/04/2022 Therapeutic Exercise: Stationary bike with progressive lowering x5 minutes while collecting subjective information Prone quad stretch with strap by PT x268m Supine Lt knee AAROM with green strap 2x10 with 5-sec  hold, PT assistance PRN 3# [place] press with marching 2x20 BIL Seated BIL knee extension machine with 10# 2x10 with 3-sec holds Manual Therapy: N/A Neuromuscular re-ed: N/A Therapeutic Activity: Re-administration of FOTO with pt education Modalities: N/A Self Care: N/A       PATIENT EDUCATION:  Education details: see treatment  Person educated: Patient Education method: Explanation, Demonstration, Tactile cues, Verbal cues, and Handouts Education comprehension: verbalized understanding, returned demonstration, verbal cues required, tactile cues required, and needs further education     HOME EXERCISE PROGRAM: Access Code: [email or web address]: [email or web address] Date: 04/01/2022 Prepared by: SaGwendolyn Grant Exercises - Standing March with Counter Support  - 2 x daily - 7 x weekly - 2 sets - 10 reps - Supine Heel Slide  - 2 x daily - 7 x weekly - 1 sets - 10 reps - 5 sec  hold - Supine Quad Set  - 2 x daily - 7 x weekly - 2  sets - 10 reps - 5 sec  hold - Seated Knee Extension AAROM  - 2 x daily - 7 x weekly - 1 sets - 10 reps - 5 sec  hold  Added 04/22/2022: - Seated Long Arc Quad  - 1 x daily - 7 x weekly - 3 sets - 10 reps - 3 seconds hold - Supine Core Bicycle  - 1 x daily - 7 x weekly - 3 sets - 12 reps   ASSESSMENT:   CLINICAL IMPRESSION: Pt responded well to all interventions today, demonstrating good form and no increase in pain throughout the session. She will continue to benefit from skilled PT to address her primary impairments and return to her prior level of function with less limitation.      OBJECTIVE IMPAIRMENTS Abnormal gait, decreased activity tolerance, decreased balance, difficulty walking, decreased ROM, decreased strength, improper body mechanics, postural dysfunction, and pain.    ACTIVITY LIMITATIONS carrying, lifting, bending, standing, squatting, stairs, transfers, and locomotion level   PARTICIPATION LIMITATIONS: meal prep, cleaning,  laundry, shopping, and community activity   PERSONAL FACTORS Age, Fitness, Time since onset of injury/illness/exacerbation, and 3+ comorbidities: see PMH above  are also affecting patient's functional outcome.        GOALS: Goals reviewed with patient? Yes   SHORT TERM GOALS: Target date: 04/23/22 Patient will be independent and compliant with initial HEP.    Baseline: issued at eval  04/15/2022: Pt reports adherence to her HEP Goal status: ACHIEVED   2.  Patient will demonstrate at least 120 degrees of Lt knee flexion AROM to improve gait mechanics.  Baseline: see above  04/15/2022: 107d flexion following AAROM 04/22/2022: 109d flexion following AAROM 04/29/2022: 113d flexion AROM Goal status: IN PROGRESS   3.  Patient will complete TUG in </= 18 seconds to signify a reduction in fall risk.  Baseline: see above  04/21/2022: 16 seconds Goal status: ACHIEVED     LONG TERM GOALS: Target date: 05/14/22   Patient will demonstrate at least 4/5 Lt knee extension strength to improve ability to complete stair and curb negotiation.  Baseline: see above  04/22/2022: 5/5 Goal status: ACHIEVED   2.  Patient will report pain at worst rated as </=5/10 to reduce her current functional limitations.  Baseline: 8/10  04/22/2022: 5/10 worst pain Goal status: ACHIEVED   3.  Patient will be able to participate in community activity independently.  Baseline: unable to attend community events independently at this time due to pain.  04/22/2022: Pt reports ability to partake in community activity such as going to plays, but not grocery shopping Goal status: IN PROGRESS   4.  Patient will score at least 52% on FOTO to signify clinically meaningful improvement in functional abilities.    Baseline: 37 04/22/2022: 36% 05/04/2022: 33% Goal status: IN PROGRESS     PLAN: PT FREQUENCY: 2x/week   PT DURATION: 6 weeks   PLANNED INTERVENTIONS: Therapeutic exercises, Therapeutic activity,  Neuromuscular re-education, Balance training, Gait training, Patient/Family education, Self Care, Joint mobilization, Stair training, Aquatic Therapy, Dry Needling, Cryotherapy, Moist heat, Vasopneumatic device, Manual therapy, and Re-evaluation   PLAN FOR NEXT SESSION: review and progress HEP prn; manual to knee; focus on restoring flexion, hip flexor stretching, quad strengthening.    [facility] [employer] [facility] [employer], PT, DPT 05/11/22 1:41 PM

## 2022-05-13 ENCOUNTER — Ambulatory Visit: Payer: Medicare HMO

## 2022-05-13 DIAGNOSIS — R2689 Other abnormalities of gait and mobility: Secondary | ICD-10-CM

## 2022-05-13 DIAGNOSIS — M25562 Pain in left knee: Secondary | ICD-10-CM | POA: Diagnosis not present

## 2022-05-13 DIAGNOSIS — M6281 Muscle weakness (generalized): Secondary | ICD-10-CM

## 2022-05-13 DIAGNOSIS — G8929 Other chronic pain: Secondary | ICD-10-CM

## 2022-05-13 NOTE — Therapy (Signed)
OUTPATIENT PHYSICAL THERAPY TREATMENT NOTE/ RE-CERTIFICATION   Patient Name: [name] MRN: [id number] DOB:Mar 31, 1941, 81 y.o., female Today's Date: 05/13/2022  PCP: [name] REFERRING PROVIDER: Frederik Pear, MD  END OF SESSION:   PT End of Session - 05/13/22 1207     Visit Number 13    Number of Visits 25    Date for PT Re-Evaluation 07/01/22    Authorization Type [employer] MCR    Authorization Time Period FOTO V6, V10, kx mod v15    Progress Note Due on Visit 10    PT Start Time 1130    PT Stop Time 1210    PT Time Calculation (min) 40 min    Equipment Utilized During Treatment Gait belt    Activity Tolerance Patient tolerated treatment well    Behavior During Therapy WFL for tasks assessed/performed                        Past Medical History:  Diagnosis Date   Anemia    PMH;  Patient denies this dx as of 06/03/21   Anxiety    Asthma    Breast cancer ([place]) 1988   left breast cancer   Collagenous colitis    Compression fracture    lumbar 1   Dyspnea    with exertion   Hypertension    Hypothyroidism    Osteoarthritis    Osteopenia    Pneumonia 07/2020   PONV (postoperative nausea and vomiting)    Sleep apnea    wears CPAP nightly   Past Surgical History:  Procedure Laterality Date   BREAST REDUCTION SURGERY     Right   CATARACT EXTRACTION W/ INTRAOCULAR LENS  IMPLANT, BILATERAL     CESAREAN SECTION     COLONOSCOPY W/ BIOPSIES AND POLYPECTOMY     CYST EXCISION N/A 08/30/2018   Procedure: excision of posterior shoulder / back sebaceous cyst;  Surgeon: Wallace Going, DO;  Location: [facility];  Service: Plastics;  Laterality: N/A;   KNEE ARTHROSCOPY Left 02/23/2022   Procedure: LEFT KNEE ARTHROSCOPY;  Surgeon: Melrose Nakayama, MD;  Location[facility];  Service: Orthopedics;  Laterality: Left;   KYPHOPLASTY N/A 02/18/2016   Procedure: LUMBAR 1 KYPHOPLASTY;  Surgeon: Phylliss Bob, MD;  Location: [facility];   Service: Orthopedics;  Laterality: N/A;  LUMBAR 1 KYPHOPLASTY   KYPHOPLASTY N/A 06/04/2021   Procedure: LUMBAR 2 KYPHOPLASTY;  Surgeon: Phylliss Bob, MD;  Location: [facility];  Service: Orthopedics;  Laterality: N/A;   LAPAROSCOPIC APPENDECTOMY N/A 12/09/2021   Procedure: APPENDECTOMY LAPAROSCOPIC;  Surgeon: Ileana Roup, MD;  Location: [facility];  Service: General;  Laterality: N/A;   LUMBAR DISC SURGERY     MODIFIED RADICAL MASTECTOMY W/ AXILLARY LYMPH NODE DISSECTION     Left   RECONSTRUCTION BREAST W/ LATISSIMUS DORSI FLAP     left   REDUCTION MAMMAPLASTY Right    right foot surgery     corrected hammer toe, straightened big toe   SEPTOPLASTY     TOE SURGERY     TONSILLECTOMY AND ADENOIDECTOMY     Patient Active Problem List   Diagnosis Date Noted   Acute appendicitis 12/09/2021   S/P laparoscopic appendectomy 12/09/2021   CAP (community acquired pneumonia) 09/30/2020   Chronic respiratory failure with hypoxia ([place]) 09/23/2020   Allergic rhinitis 08/31/2019   Pilar cyst 08/04/2018   History of breast cancer in female 06/09/2018   History of reconstruction of both  breasts 06/09/2018   Severe obesity (BMI >= 40) ([facility]) 09/16/2015   Asthma with acute exacerbation 09/15/2015   DOE (dyspnea on exertion) 01/11/2014   Insomnia 05/05/2012   Osteoarthritis    Osteopenia    Collagenous colitis    OSA (obstructive sleep apnea)    Hypothyroidism    Mild persistent asthma    Hypertension    Breast cancer, left breast ([place])     REFERRING DIAG: s/p left knee scope  THERAPY DIAG:  Chronic pain of left knee - Plan: PT plan of care cert/re-cert  Muscle weakness (generalized) - Plan: PT plan of care cert/re-cert  Other abnormalities of gait and mobility - Plan: PT plan of care cert/re-cert  Rationale for Evaluation and Treatment Rehabilitation  PERTINENT HISTORY: Lt knee partial lateral meniscectomy on 02/23/22, breast cx, anxiety, asthma, compression fx,  osteopenia  PRECAUTIONS: Falls  SUBJECTIVE: Pt reports continued adherence to her HEP. She rates her Lt knee pain as 2/10 today.   PAIN:  Are you having pain? Yes: NPRS scale: 2/10 Pain location: anterior Lt knee Pain description: sharp Aggravating factors: prolonged standing/walking, activity in general Relieving factors: rest with pillow under the knee   OBJECTIVE: (objective measures completed at initial evaluation unless otherwise dated)   DIAGNOSTIC FINDINGS:  Lt knee MRI: IMPRESSION: 1. Advanced tricompartmental osteoarthritis, most severe within the lateral compartment. 2. Advanced degeneration and tearing throughout the lateral meniscus. 3. Small joint effusion with multiple intra-articular loose bodies.   PATIENT SURVEYS:  FOTO 37% function to 52% predicted  04/22/2022: 36%  05/04/2022: 33%   COGNITION:           Overall cognitive status: Within functional limits for tasks assessed                          SENSATION: Not tested   EDEMA:  No obvious swelling about the Lt knee      POSTURE: rounded shoulders and forward head   PALPATION: TTP lateral joint line    LOWER EXTREMITY ROM:   Active ROM Right eval Left eval Right 04/09/2022 Left 04/09/2022 Left 04/15/2022 Left 04/22/2022 Left 04/29/2022 Left 05/13/2022  Hip flexion            Hip extension            Hip abduction            Hip adduction            Hip internal rotation            Hip external rotation            Knee flexion 120 109 113 95, 102 following  AAROM 101, 107 following AAROM 104, 109 following AAROM 113 AROM, 115 PROM 105 AROM, 116 PROM following AAROM  Knee extension Unable to accurately assess due to hip flexor tightness  Unable to accurately assess due to hip flexor tightness  -7 -20    -9 AROM  Ankle dorsiflexion            Ankle plantarflexion            Ankle inversion            Ankle eversion             (Blank rows = not tested)   LOWER EXTREMITY MMT:   MMT  Right eval Left eval Left 04/22/2022  Hip flexion 4+ 4   Hip extension  Hip abduction       Hip adduction       Hip internal rotation       Hip external rotation       Knee flexion 5 4+ 5/5  Knee extension 5 3- 5/5  Ankle dorsiflexion       Ankle plantarflexion       Ankle inversion       Ankle eversion        (Blank rows = not tested)   LOWER EXTREMITY SPECIAL TESTS:  None    FUNCTIONAL TESTS:  5 x STS 14.65 seconds TUG 23.8 seconds   04/22/2022: TUG: 16 seconds 5xSTS: 12.5 seconds  05/13/2022: TUG: 15 seconds 6MWT: 350 ft, one rest break at 4 minutes to 5 minutes   GAIT: Distance walked: 10 ft  Assistive device utilized: Single point cane Level of assistance: Modified independence Comments: limited knee flexion during stance and swing; WBOS       TODAY'S TREATMENT:  [facility] Adult PT Treatment:                                                DATE: 05/13/2022 Therapeutic Exercise: NuStep with progressive lowering into knee flexion while collecting subjective information x5 min Supine Lt knee AAROM with strap x10 with 5-sec holds Seated active Lt hamstring stretch with stool x66mn 6MWT Manual Therapy: N/A Neuromuscular re-ed: N/A Therapeutic Activity: Re-assessment of objective measures with pt education Modalities: N/A Self Care: N/A   [facility] Adult PT Treatment:                                                DATE: 05/11/2022 Therapeutic Exercise: NuStep with progressive lowering into knee flexion while collecting subjective information x5 min Sit-to-stand with 5# kettlebell at chest 3x6 2-inch step-up and retro step down 2x10 BIL Seated marching with green band around thighs 2x10 with 3-sec holds BIL Standing hip abduction with GTB around thighs 3x10 Manual Therapy: N/A Neuromuscular re-ed: N/A Therapeutic Activity: N/A Modalities: N/A Self Care: N/A   [facility] Adult PT Treatment:                                                DATE:  05/06/2022 Therapeutic Exercise: Stationary bike with progressive lowering x5 minutes while collecting subjective information Deadlift with 15# kettlebell 3x8 Sit-to-stand with alternating side step with GTB around thighs at table 3x10 Supine 90/90 with handhold resistance to thighs 3x30 seconds Seated active hamstring stretch x253m on Lt Manual Therapy: N/A Neuromuscular re-ed: N/A Therapeutic Activity: N/A Modalities: N/A Self Care: N/A    PATIENT EDUCATION:  Education details: see treatment  Person educated: Patient Education method: Explanation, Demonstration, Tactile cues, Verbal cues, and Handouts Education comprehension: verbalized understanding, returned demonstration, verbal cues required, tactile cues required, and needs further education     HOME EXERCISE PROGRAM: Access Code: [email or web address]: [email or web address] Date: 04/01/2022 Prepared by: SaGwendolyn Grant Exercises - Standing March with Counter Support  - 2 x daily - 7 x weekly - 2 sets - 10 reps - Supine Heel Slide  - 2  x daily - 7 x weekly - 1 sets - 10 reps - 5 sec  hold - Supine Quad Set  - 2 x daily - 7 x weekly - 2 sets - 10 reps - 5 sec  hold - Seated Knee Extension AAROM  - 2 x daily - 7 x weekly - 1 sets - 10 reps - 5 sec  hold  Added 04/22/2022: - Seated Long Arc Quad  - 1 x daily - 7 x weekly - 3 sets - 10 reps - 3 seconds hold - Supine Core Bicycle  - 1 x daily - 7 x weekly - 3 sets - 12 reps   ASSESSMENT:   CLINICAL IMPRESSION: Pt responded well to all interventions today, although she continues to demonstrate pain and limitation with walking. She will continue to benefit from skilled PT to address her primary impairments and return to her prior level of function with less limitation.      OBJECTIVE IMPAIRMENTS Abnormal gait, decreased activity tolerance, decreased balance, difficulty walking, decreased ROM, decreased strength, improper body mechanics, postural dysfunction, and  pain.    ACTIVITY LIMITATIONS carrying, lifting, bending, standing, squatting, stairs, transfers, and locomotion level   PARTICIPATION LIMITATIONS: meal prep, cleaning, laundry, shopping, and community activity   PERSONAL FACTORS Age, Fitness, Time since onset of injury/illness/exacerbation, and 3+ comorbidities: see PMH above  are also affecting patient's functional outcome.        GOALS: Goals reviewed with patient? Yes   SHORT TERM GOALS: Target date: 04/23/22 Patient will be independent and compliant with initial HEP.    Baseline: issued at eval  04/15/2022: Pt reports adherence to her HEP Goal status: ACHIEVED   2.  Patient will demonstrate at least 120 degrees of Lt knee flexion AROM to improve gait mechanics.  Baseline: see above  04/15/2022: 107d flexion following AAROM 04/22/2022: 109d flexion following AAROM 04/29/2022: 113d flexion AROM 05/13/2022: 105d flexion AROM Goal status: IN PROGRESS   3.  Patient will complete TUG in </= 18 seconds to signify a reduction in fall risk.  Baseline: see above  04/21/2022: 16 seconds 05/13/2022: 15 seconds Goal status: ACHIEVED     LONG TERM GOALS: Target date: 05/14/22   Patient will demonstrate at least 4/5 Lt knee extension strength to improve ability to complete stair and curb negotiation.  Baseline: see above  04/22/2022: 5/5 Goal status: ACHIEVED   2.  Patient will report pain at worst rated as </=5/10 to reduce her current functional limitations.  Baseline: 8/10  04/22/2022: 5/10 worst pain Goal status: ACHIEVED   3.  Patient will be able to participate in community activity independently.  Baseline: unable to attend community events independently at this time due to pain.  04/22/2022: Pt reports ability to partake in community activity such as going to plays, but not grocery shopping Goal status: IN PROGRESS   4.  Patient will score at least 52% on FOTO to signify clinically meaningful improvement in functional  abilities.    Baseline: 37 04/22/2022: 36% 05/04/2022: 33% Goal status: IN PROGRESS  5.  *New 05/13/2022* Patient will ambulate 550 feet in  a 6-minute walk test in order to demonstrate safety with functional community ambulation.    Baseline (05/13/2022): 350 ft, one rest break at 4 minutes to 5 minutes Goal status: INITIAL     PLAN: PT FREQUENCY: 2x/week   PT DURATION: 6 weeks   PLANNED INTERVENTIONS: Therapeutic exercises, Therapeutic activity, Neuromuscular re-education, Balance training, Gait training, Patient/Family education, Self Care, Joint  mobilization, Stair training, Aquatic Therapy, Dry Needling, Cryotherapy, Moist heat, Vasopneumatic device, Manual therapy, and Re-evaluation   PLAN FOR NEXT SESSION: review and progress HEP prn; manual to knee; focus on restoring flexion, hip flexor stretching, quad strengthening.    Vanessa [place], PT, DPT 05/13/22 12:16 PM

## 2022-05-18 ENCOUNTER — Ambulatory Visit: Payer: Medicare HMO

## 2022-05-20 ENCOUNTER — Ambulatory Visit: Payer: Medicare HMO

## 2022-06-01 ENCOUNTER — Ambulatory Visit: Payer: Medicare HMO

## 2022-06-01 DIAGNOSIS — R2689 Other abnormalities of gait and mobility: Secondary | ICD-10-CM

## 2022-06-01 DIAGNOSIS — M6281 Muscle weakness (generalized): Secondary | ICD-10-CM

## 2022-06-01 DIAGNOSIS — G8929 Other chronic pain: Secondary | ICD-10-CM

## 2022-06-01 DIAGNOSIS — M25562 Pain in left knee: Secondary | ICD-10-CM | POA: Diagnosis not present

## 2022-06-01 NOTE — Therapy (Signed)
OUTPATIENT PHYSICAL THERAPY TREATMENT NOTE   Patient Name: [name] MRN: [id number] DOB:June 26, 1941, 81 y.o., female Today's Date: 06/01/2022  PCP: [name] REFERRING PROVIDER: Frederik Pear, MD  END OF SESSION:   PT End of Session - 06/01/22 1400     Visit Number 14    Number of Visits 25    Date for PT Re-Evaluation 07/01/22    Authorization Type [employer] MCR    Authorization Time Period FOTO V6, V10, kx mod v15    PT Start Time 1400    PT Stop Time 1445    PT Time Calculation (min) 45 min    Equipment Utilized During Treatment --    Activity Tolerance Patient tolerated treatment well    Behavior During Therapy WFL for tasks assessed/performed                         Past Medical History:  Diagnosis Date   Anemia    PMH;  Patient denies this dx as of 06/03/21   Anxiety    Asthma    Breast cancer ([place]) 1988   left breast cancer   Collagenous colitis    Compression fracture    lumbar 1   Dyspnea    with exertion   Hypertension    Hypothyroidism    Osteoarthritis    Osteopenia    Pneumonia 07/2020   PONV (postoperative nausea and vomiting)    Sleep apnea    wears CPAP nightly   Past Surgical History:  Procedure Laterality Date   BREAST REDUCTION SURGERY     Right   CATARACT EXTRACTION W/ INTRAOCULAR LENS  IMPLANT, BILATERAL     CESAREAN SECTION     COLONOSCOPY W/ BIOPSIES AND POLYPECTOMY     CYST EXCISION N/A 08/30/2018   Procedure: excision of posterior shoulder / back sebaceous cyst;  Surgeon: Wallace Going, DO;  Location: [place];  Service: Plastics;  Laterality: N/A;   KNEE ARTHROSCOPY Left 02/23/2022   Procedure: LEFT KNEE ARTHROSCOPY;  Surgeon: Melrose Nakayama, MD;  Location: [facility];  Service: Orthopedics;  Laterality: Left;   KYPHOPLASTY N/A 02/18/2016   Procedure: LUMBAR 1 KYPHOPLASTY;  Surgeon: Phylliss Bob, MD;  Location: [place];  Service: Orthopedics;  Laterality: N/A;  LUMBAR 1 KYPHOPLASTY    KYPHOPLASTY N/A 06/04/2021   Procedure: LUMBAR 2 KYPHOPLASTY;  Surgeon: Phylliss Bob, MD;  Location: [place];  Service: Orthopedics;  Laterality: N/A;   LAPAROSCOPIC APPENDECTOMY N/A 12/09/2021   Procedure: APPENDECTOMY LAPAROSCOPIC;  Surgeon: Ileana Roup, MD;  Location: [place];  Service: General;  Laterality: N/A;   LUMBAR DISC SURGERY     MODIFIED RADICAL MASTECTOMY W/ AXILLARY LYMPH NODE DISSECTION     Left   RECONSTRUCTION BREAST W/ LATISSIMUS DORSI FLAP     left   REDUCTION MAMMAPLASTY Right    right foot surgery     corrected hammer toe, straightened big toe   SEPTOPLASTY     TOE SURGERY     TONSILLECTOMY AND ADENOIDECTOMY     Patient Active Problem List   Diagnosis Date Noted   Acute appendicitis 12/09/2021   S/P laparoscopic appendectomy 12/09/2021   CAP (community acquired pneumonia) 09/30/2020   Chronic respiratory failure with hypoxia ([place]) 09/23/2020   Allergic rhinitis 08/31/2019   Pilar cyst 08/04/2018   History of breast cancer in female 06/09/2018   History of reconstruction of both breasts 06/09/2018   Severe obesity (BMI >= 40) ([place])  09/16/2015   Asthma with acute exacerbation 09/15/2015   DOE (dyspnea on exertion) 01/11/2014   Insomnia 05/05/2012   Osteoarthritis    Osteopenia    Collagenous colitis    OSA (obstructive sleep apnea)    Hypothyroidism    Mild persistent asthma    Hypertension    Breast cancer, left breast ([place])     REFERRING DIAG: s/p left knee scope  THERAPY DIAG:  Chronic pain of left knee  Muscle weakness (generalized)  Other abnormalities of gait and mobility  Rationale for Evaluation and Treatment Rehabilitation  PERTINENT HISTORY: Lt knee partial lateral meniscectomy on 02/23/22, breast cx, anxiety, asthma, compression fx, osteopenia  PRECAUTIONS: Falls  SUBJECTIVE: "It's cold outside and I have osteoarthritis, so my knee hurts." Patient reports episode of gout since last session that has set her back somewhat as  she wasn't able to walk for almost a week when her foot was very painful. She reports having to use a wheelchair for ambulation for about a week because her foot pain was so severe. She reports her foot pain has resolved.   PAIN:  Are you having pain? Yes: NPRS scale: 3/10 Pain location: anterior Lt knee Pain description: ache Aggravating factors: prolonged standing/walking, activity in general Relieving factors: rest with pillow under the knee   OBJECTIVE: (objective measures completed at initial evaluation unless otherwise dated)   DIAGNOSTIC FINDINGS:  Lt knee MRI: IMPRESSION: 1. Advanced tricompartmental osteoarthritis, most severe within the lateral compartment. 2. Advanced degeneration and tearing throughout the lateral meniscus. 3. Small joint effusion with multiple intra-articular loose bodies.   PATIENT SURVEYS:  FOTO 37% function to 52% predicted  04/22/2022: 36%  05/04/2022: 33%   COGNITION:           Overall cognitive status: Within functional limits for tasks assessed                          SENSATION: Not tested   EDEMA:  No obvious swelling about the Lt knee      POSTURE: rounded shoulders and forward head   PALPATION: TTP lateral joint line    LOWER EXTREMITY ROM:   Active ROM Right eval Left eval Right 04/09/2022 Left 04/09/2022 Left 04/15/2022 Left 04/22/2022 Left 04/29/2022 Left 05/13/2022  Hip flexion            Hip extension            Hip abduction            Hip adduction            Hip internal rotation            Hip external rotation            Knee flexion 120 109 113 95, 102 following  AAROM 101, 107 following AAROM 104, 109 following AAROM 113 AROM, 115 PROM 105 AROM, 116 PROM following AAROM  Knee extension Unable to accurately assess due to hip flexor tightness  Unable to accurately assess due to hip flexor tightness  -7 -20    -9 AROM  Ankle dorsiflexion            Ankle plantarflexion            Ankle inversion             Ankle eversion             (Blank rows = not tested)   LOWER EXTREMITY MMT:  MMT Right eval Left eval Left 04/22/2022 06/01/22 Left   Hip flexion 4+ 4  4+  Hip extension        Hip abduction        Hip adduction        Hip internal rotation        Hip external rotation        Knee flexion 5 4+ 5/5   Knee extension 5 3- 5/5   Ankle dorsiflexion        Ankle plantarflexion        Ankle inversion        Ankle eversion         (Blank rows = not tested)   LOWER EXTREMITY SPECIAL TESTS:  None    FUNCTIONAL TESTS:  5 x STS 14.65 seconds TUG 23.8 seconds   04/22/2022: TUG: 16 seconds 5xSTS: 12.5 seconds  05/13/2022: TUG: 15 seconds 6MWT: 350 ft, one rest break at 4 minutes to 5 minutes    GAIT: Distance walked: 10 ft  Assistive device utilized: Single point cane Level of assistance: Modified independence Comments: limited knee flexion during stance and swing; WBOS       TODAY'S TREATMENT: [facility] Adult PT Treatment:                                                DATE: 06/01/22 Therapeutic Exercise: NuStep level 5 x 5 minutes UE/LE Heel slides with strap x 10; LLE SLR 2 x 10  Sidelying hip abduction 2 x 10  LAQ 2 x 10; 2.5 lbs  Reviewed HEP   [facility] Adult PT Treatment:                                                DATE: 05/13/2022 Therapeutic Exercise: NuStep with progressive lowering into knee flexion while collecting subjective information x5 min Supine Lt knee AAROM with strap x10 with 5-sec holds Seated active Lt hamstring stretch with stool x65mn 6MWT Manual Therapy: N/A Neuromuscular re-ed: N/A Therapeutic Activity: Re-assessment of objective measures with pt education Modalities: N/A Self Care: N/A   [facility] Adult PT Treatment:                                                DATE: 05/11/2022 Therapeutic Exercise: NuStep with progressive lowering into knee flexion while collecting subjective information x5 min Sit-to-stand with 5# kettlebell at  chest 3x6 2-inch step-up and retro step down 2x10 BIL Seated marching with green band around thighs 2x10 with 3-sec holds BIL Standing hip abduction with GTB around thighs 3x10  PATIENT EDUCATION:  Education details: see treatment  Person educated: Patient Education method: Explanation Education comprehension: verbalized understanding     HOME EXERCISE PROGRAM: Access Code: [email or web address]: [email or web address] Date: 04/01/2022 Prepared by: SGwendolyn Grant  Exercises - Standing March with Counter Support  - 2 x daily - 7 x weekly - 2 sets - 10 reps - Supine Heel Slide  - 2 x daily - 7 x weekly - 1 sets - 10 reps - 5 sec  hold - Supine Quad Set  -  2 x daily - 7 x weekly - 2 sets - 10 reps - 5 sec  hold - Seated Knee Extension AAROM  - 2 x daily - 7 x weekly - 1 sets - 10 reps - 5 sec  hold  Added 04/22/2022: - Seated Long Arc Quad  - 1 x daily - 7 x weekly - 3 sets - 10 reps - 3 seconds hold - Supine Core Bicycle  - 1 x daily - 7 x weekly - 3 sets - 12 reps   ASSESSMENT:   CLINICAL IMPRESSION: Patient reports an episode of gout in the Lt foot since last session that has set her back somewhat as she was unable to ambulate for almost a week. Today's session focused on mat strengthening/ROM activity given her recent setback/immobility due to gout. She tolerated ther ex well without reports of increased knee pain. No changes made to HEP at this time.      OBJECTIVE IMPAIRMENTS Abnormal gait, decreased activity tolerance, decreased balance, difficulty walking, decreased ROM, decreased strength, improper body mechanics, postural dysfunction, and pain.    ACTIVITY LIMITATIONS carrying, lifting, bending, standing, squatting, stairs, transfers, and locomotion level   PARTICIPATION LIMITATIONS: meal prep, cleaning, laundry, shopping, and community activity   PERSONAL FACTORS Age, Fitness, Time since onset of injury/illness/exacerbation, and 3+ comorbidities: see PMH above  are  also affecting patient's functional outcome.        GOALS: Goals reviewed with patient? Yes   SHORT TERM GOALS: Target date: 04/23/22 Patient will be independent and compliant with initial HEP.    Baseline: issued at eval  04/15/2022: Pt reports adherence to her HEP Goal status: ACHIEVED   2.  Patient will demonstrate at least 120 degrees of Lt knee flexion AROM to improve gait mechanics.  Baseline: see above  04/15/2022: 107d flexion following AAROM 04/22/2022: 109d flexion following AAROM 04/29/2022: 113d flexion AROM 05/13/2022: 105d flexion AROM Goal status: IN PROGRESS   3.  Patient will complete TUG in </= 18 seconds to signify a reduction in fall risk.  Baseline: see above  04/21/2022: 16 seconds 05/13/2022: 15 seconds Goal status: ACHIEVED     LONG TERM GOALS: Target date: 05/14/22   Patient will demonstrate at least 4/5 Lt knee extension strength to improve ability to complete stair and curb negotiation.  Baseline: see above  04/22/2022: 5/5 Goal status: ACHIEVED   2.  Patient will report pain at worst rated as </=5/10 to reduce her current functional limitations.  Baseline: 8/10  04/22/2022: 5/10 worst pain Goal status: ACHIEVED   3.  Patient will be able to participate in community activity independently.  Baseline: unable to attend community events independently at this time due to pain.  04/22/2022: Pt reports ability to partake in community activity such as going to plays, but not grocery shopping Goal status: IN PROGRESS   4.  Patient will score at least 52% on FOTO to signify clinically meaningful improvement in functional abilities.    Baseline: 37 04/22/2022: 36% 05/04/2022: 33% Goal status: IN PROGRESS  5.  *New 05/13/2022* Patient will ambulate 550 feet in  a 6-minute walk test in order to demonstrate safety with functional community ambulation.    Baseline (05/13/2022): 350 ft, one rest break at 4 minutes to 5 minutes Goal status: INITIAL      PLAN: PT FREQUENCY: 2x/week   PT DURATION: 6 weeks   PLANNED INTERVENTIONS: Therapeutic exercises, Therapeutic activity, Neuromuscular re-education, Balance training, Gait training, Patient/Family education, Self Care, Joint mobilization, Stair  training, Aquatic Therapy, Dry Needling, Cryotherapy, Moist heat, Vasopneumatic device, Manual therapy, and Re-evaluation   PLAN FOR NEXT SESSION: review and progress HEP prn; manual to knee; focus on restoring flexion, hip flexor stretching, quad strengthening.   Gwendolyn Grant, PT, DPT, ATC 06/01/22 2:55 PM

## 2022-06-03 ENCOUNTER — Ambulatory Visit: Payer: Medicare HMO

## 2022-06-03 DIAGNOSIS — M25562 Pain in left knee: Secondary | ICD-10-CM | POA: Diagnosis not present

## 2022-06-03 DIAGNOSIS — R2689 Other abnormalities of gait and mobility: Secondary | ICD-10-CM

## 2022-06-03 DIAGNOSIS — M6281 Muscle weakness (generalized): Secondary | ICD-10-CM

## 2022-06-03 DIAGNOSIS — G8929 Other chronic pain: Secondary | ICD-10-CM

## 2022-06-03 NOTE — Therapy (Signed)
OUTPATIENT PHYSICAL THERAPY TREATMENT NOTE   Patient Name: [name] MRN: [id number] DOB:[birth year], 81 y.o., female Today's Date: 06/03/2022  PCP: [name] REFERRING PROVIDER: Frederik Pear, MD  END OF SESSION:   PT End of Session - 06/03/22 1400     Visit Number 15    Number of Visits 25    Date for PT Re-Evaluation 07/01/22    Authorization Type Aet[employer] MCR    Authorization Time Period FOTO V6, V10, kx mod v15    PT Start Time 9390    PT Stop Time 1439    PT Time Calculation (min) 40 min    Activity Tolerance Patient tolerated treatment well    Behavior During Therapy WFL for tasks assessed/performed                          Past Medical History:  Diagnosis Date   Anemia    PMH;  Patient denies this dx as of 06/03/21   Anxiety    Asthma    Breast cancer ([place]) 1988   left breast cancer   Collagenous colitis    Compression fracture    lumbar 1   Dyspnea    with exertion   Hypertension    Hypothyroidism    Osteoarthritis    Osteopenia    Pneumonia 07/2020   PONV (postoperative nausea and vomiting)    Sleep apnea    wears CPAP nightly   Past Surgical History:  Procedure Laterality Date   BREAST REDUCTION SURGERY     Right   CATARACT EXTRACTION W/ INTRAOCULAR LENS  IMPLANT, BILATERAL     CESAREAN SECTION     COLONOSCOPY W/ BIOPSIES AND POLYPECTOMY     CYST EXCISION N/A 08/30/2018   Procedure: excision of posterior shoulder / back sebaceous cyst;  Surgeon: Wallace Going, DO;  Location: [place];  Service: Plastics;  Laterality: N/A;   KNEE ARTHROSCOPY Left 02/23/2022   Procedure: LEFT KNEE ARTHROSCOPY;  Surgeon: Melrose Nakayama, MD;  Location: [facility];  Service: Orthopedics;  Laterality: Left;   KYPHOPLASTY N/A 02/18/2016   Procedure: LUMBAR 1 KYPHOPLASTY;  Surgeon: Phylliss Bob, MD;  Location: [place];  Service: Orthopedics;  Laterality: N/A;  LUMBAR 1 KYPHOPLASTY   KYPHOPLASTY N/A 06/04/2021    Procedure: LUMBAR 2 KYPHOPLASTY;  Surgeon: Phylliss Bob, MD;  Location: [place];  Service: Orthopedics;  Laterality: N/A;   LAPAROSCOPIC APPENDECTOMY N/A 12/09/2021   Procedure: APPENDECTOMY LAPAROSCOPIC;  Surgeon: Ileana Roup, MD;  Location: [place];  Service: General;  Laterality: N/A;   LUMBAR DISC SURGERY     MODIFIED RADICAL MASTECTOMY W/ AXILLARY LYMPH NODE DISSECTION     Left   RECONSTRUCTION BREAST W/ LATISSIMUS DORSI FLAP     left   REDUCTION MAMMAPLASTY Right    right foot surgery     corrected hammer toe, straightened big toe   SEPTOPLASTY     TOE SURGERY     TONSILLECTOMY AND ADENOIDECTOMY     Patient Active Problem List   Diagnosis Date Noted   Acute appendicitis 12/09/2021   S/P laparoscopic appendectomy 12/09/2021   CAP (community acquired pneumonia) 09/30/2020   Chronic respiratory failure with hypoxia ([place]) 09/23/2020   Allergic rhinitis 08/31/2019   Pilar cyst 08/04/2018   History of breast cancer in female 06/09/2018   History of reconstruction of both breasts 06/09/2018   Severe obesity (BMI >= 40) ([place]) 09/16/2015   Asthma with acute exacerbation  09/15/2015   DOE (dyspnea on exertion) 01/11/2014   Insomnia 05/05/2012   Osteoarthritis    Osteopenia    Collagenous colitis    OSA (obstructive sleep apnea)    Hypothyroidism    Mild persistent asthma    Hypertension    Breast cancer, left breast ([facility])     REFERRING DIAG: s/p left knee scope  THERAPY DIAG:  Chronic pain of left knee  Muscle weakness (generalized)  Other abnormalities of gait and mobility  Rationale for Evaluation and Treatment Rehabilitation  PERTINENT HISTORY: Lt knee partial lateral meniscectomy on 02/23/22, breast cx, anxiety, asthma, compression fx, osteopenia  PRECAUTIONS: Falls  SUBJECTIVE: "My knee says it's not a good walking day." She reports completing some of her exercises sitting down yesterday.   PAIN:  Are you having pain? Yes: NPRS scale: 3/10 Pain  location: anterior Lt knee Pain description: ache Aggravating factors: prolonged standing/walking, activity in general Relieving factors: rest with pillow under the knee   OBJECTIVE: (objective measures completed at initial evaluation unless otherwise dated)   DIAGNOSTIC FINDINGS:  Lt knee MRI: IMPRESSION: 1. Advanced tricompartmental osteoarthritis, most severe within the lateral compartment. 2. Advanced degeneration and tearing throughout the lateral meniscus. 3. Small joint effusion with multiple intra-articular loose bodies.   PATIENT SURVEYS:  FOTO 37% function to 52% predicted  04/22/2022: 36%  05/04/2022: 33%   COGNITION:           Overall cognitive status: Within functional limits for tasks assessed                          SENSATION: Not tested   EDEMA:  No obvious swelling about the Lt knee      POSTURE: rounded shoulders and forward head   PALPATION: TTP lateral joint line    LOWER EXTREMITY ROM:   Active ROM Right eval Left eval Right 04/09/2022 Left 04/09/2022 Left 04/15/2022 Left 04/22/2022 Left 04/29/2022 Left 05/13/2022  Hip flexion            Hip extension            Hip abduction            Hip adduction            Hip internal rotation            Hip external rotation            Knee flexion 120 109 113 95, 102 following  AAROM 101, 107 following AAROM 104, 109 following AAROM 113 AROM, 115 PROM 105 AROM, 116 PROM following AAROM  Knee extension Unable to accurately assess due to hip flexor tightness  Unable to accurately assess due to hip flexor tightness  -7 -20    -9 AROM  Ankle dorsiflexion            Ankle plantarflexion            Ankle inversion            Ankle eversion             (Blank rows = not tested)   LOWER EXTREMITY MMT:   MMT Right eval Left eval Left 04/22/2022 06/01/22 Left   Hip flexion 4+ 4  4+  Hip extension        Hip abduction        Hip adduction        Hip internal rotation        Hip external  rotation        Knee flexion 5 4+ 5/5   Knee extension 5 3- 5/5   Ankle dorsiflexion        Ankle plantarflexion        Ankle inversion        Ankle eversion         (Blank rows = not tested)   LOWER EXTREMITY SPECIAL TESTS:  None    FUNCTIONAL TESTS:  5 x STS 14.65 seconds TUG 23.8 seconds   04/22/2022: TUG: 16 seconds 5xSTS: 12.5 seconds  05/13/2022: TUG: 15 seconds 6MWT: 350 ft, one rest break at 4 minutes to 5 minutes    GAIT: Distance walked: 10 ft  Assistive device utilized: Single point cane Level of assistance: Modified independence Comments: limited knee flexion during stance and swing; WBOS       TODAY'S TREATMENT: [facility] Adult PT Treatment:                                                DATE: 06/03/22 Therapeutic Exercise: NuStep level 5 x 5 minutes UE/LE Seated resisted hip abduction green band 2 x 10  Seated resisted march green band 2 x 10   Standing calf raise 2 x 15  Standing hip abduction 2 x 10  Standing hip extension 2 x 10  Standing march 2 x 10  Updated HEP    [facility] Adult PT Treatment:                                                DATE: 06/01/22 Therapeutic Exercise: NuStep level 5 x 5 minutes UE/LE Heel slides with strap x 10; LLE SLR 2 x 10  Sidelying hip abduction 2 x 10  LAQ 2 x 10; 2.5 lbs  Reviewed HEP   [facility] Adult PT Treatment:                                                DATE: 05/13/2022 Therapeutic Exercise: NuStep with progressive lowering into knee flexion while collecting subjective information x5 min Supine Lt knee AAROM with strap x10 with 5-sec holds Seated active Lt hamstring stretch with stool x86mn 6MWT Manual Therapy: N/A Neuromuscular re-ed: N/A Therapeutic Activity: Re-assessment of objective measures with pt education Modalities: N/A Self Care: N/A    PATIENT EDUCATION:  Education details: HEP Person educated: Patient Education method: EConsulting civil engineer demo, cues, handout Education comprehension:  verbalized understanding, returned demo, cues      HOME EXERCISE PROGRAM: Access Code: [email or web address]: [email or web address] Date: 04/01/2022 Prepared by: SGwendolyn Grant  Exercises - Standing March with Counter Support  - 2 x daily - 7 x weekly - 2 sets - 10 reps - Supine Heel Slide  - 2 x daily - 7 x weekly - 1 sets - 10 reps - 5 sec  hold - Supine Quad Set  - 2 x daily - 7 x weekly - 2 sets - 10 reps - 5 sec  hold - Seated Knee Extension AAROM  - 2 x daily - 7 x weekly - 1 sets - 10 reps -  5 sec  hold  Added 04/22/2022: - Seated Long Arc Quad  - 1 x daily - 7 x weekly - 3 sets - 10 reps - 3 seconds hold - Supine Core Bicycle  - 1 x daily - 7 x weekly - 3 sets - 12 reps   ASSESSMENT:   CLINICAL IMPRESSION: Patient arrives with ongoing Lt knee pain. Focused on progression of seated and standing strengthening with HEP updated to include further seated/standing strengthening exercises as patient reports it is easier to complete exercises in these positions at home compared to supine activity. She quickly fatigues with standing strengthening requiring frequent seated rest break. No reports of increased knee pain throughout session.      OBJECTIVE IMPAIRMENTS Abnormal gait, decreased activity tolerance, decreased balance, difficulty walking, decreased ROM, decreased strength, improper body mechanics, postural dysfunction, and pain.    ACTIVITY LIMITATIONS carrying, lifting, bending, standing, squatting, stairs, transfers, and locomotion level   PARTICIPATION LIMITATIONS: meal prep, cleaning, laundry, shopping, and community activity   PERSONAL FACTORS Age, Fitness, Time since onset of injury/illness/exacerbation, and 3+ comorbidities: see PMH above  are also affecting patient's functional outcome.        GOALS: Goals reviewed with patient? Yes   SHORT TERM GOALS: Target date: 04/23/22 Patient will be independent and compliant with initial HEP.    Baseline: issued at  eval  04/15/2022: Pt reports adherence to her HEP Goal status: ACHIEVED   2.  Patient will demonstrate at least 120 degrees of Lt knee flexion AROM to improve gait mechanics.  Baseline: see above  04/15/2022: 107d flexion following AAROM 04/22/2022: 109d flexion following AAROM 04/29/2022: 113d flexion AROM 05/13/2022: 105d flexion AROM Goal status: IN PROGRESS   3.  Patient will complete TUG in </= 18 seconds to signify a reduction in fall risk.  Baseline: see above  04/21/2022: 16 seconds 05/13/2022: 15 seconds Goal status: ACHIEVED     LONG TERM GOALS: Target date: 05/14/22   Patient will demonstrate at least 4/5 Lt knee extension strength to improve ability to complete stair and curb negotiation.  Baseline: see above  04/22/2022: 5/5 Goal status: ACHIEVED   2.  Patient will report pain at worst rated as </=5/10 to reduce her current functional limitations.  Baseline: 8/10  04/22/2022: 5/10 worst pain Goal status: ACHIEVED   3.  Patient will be able to participate in community activity independently.  Baseline: unable to attend community events independently at this time due to pain.  04/22/2022: Pt reports ability to partake in community activity such as going to plays, but not grocery shopping Goal status: IN PROGRESS   4.  Patient will score at least 52% on FOTO to signify clinically meaningful improvement in functional abilities.    Baseline: 37 04/22/2022: 36% 05/04/2022: 33% Goal status: IN PROGRESS  5.  *New 05/13/2022* Patient will ambulate 550 feet in  a 6-minute walk test in order to demonstrate safety with functional community ambulation.    Baseline (05/13/2022): 350 ft, one rest break at 4 minutes to 5 minutes Goal status: INITIAL     PLAN: PT FREQUENCY: 2x/week   PT DURATION: 6 weeks   PLANNED INTERVENTIONS: Therapeutic exercises, Therapeutic activity, Neuromuscular re-education, Balance training, Gait training, Patient/Family education, Self Care,  Joint mobilization, Stair training, Aquatic Therapy, Dry Needling, Cryotherapy, Moist heat, Vasopneumatic device, Manual therapy, and Re-evaluation   PLAN FOR NEXT SESSION: review and progress HEP prn; manual to knee; focus on restoring flexion, hip flexor stretching, quad strengthening.   Aldona Bar  Kamie Korber, PT, DPT, ATC 06/03/22 2:40 PM

## 2022-06-07 ENCOUNTER — Telehealth: Payer: Self-pay

## 2022-06-07 ENCOUNTER — Other Ambulatory Visit: Payer: Self-pay

## 2022-06-07 DIAGNOSIS — I10 Essential (primary) hypertension: Secondary | ICD-10-CM

## 2022-06-07 MED ORDER — HYDRALAZINE HCL 50 MG PO TABS: 50.0000 mg | ORAL_TABLET | Freq: Three times a day (TID) | ORAL | 3 refills | Status: DC

## 2022-06-07 NOTE — Telephone Encounter (Signed)
Pt needs rf of Hydralazine sent to [facility] as well as a few sent in to [facility] to tide her over til she gets mail order. She's not sure if she is supposed to continue taking it. Contact if there is any issues otherwise send to pharmacy

## 2022-06-07 NOTE — Telephone Encounter (Signed)
Done

## 2022-06-08 ENCOUNTER — Ambulatory Visit: Payer: Medicare HMO | Attending: Orthopaedic Surgery

## 2022-06-08 DIAGNOSIS — G8929 Other chronic pain: Secondary | ICD-10-CM | POA: Diagnosis present

## 2022-06-08 DIAGNOSIS — M25562 Pain in left knee: Secondary | ICD-10-CM | POA: Insufficient documentation

## 2022-06-08 DIAGNOSIS — M6281 Muscle weakness (generalized): Secondary | ICD-10-CM | POA: Diagnosis present

## 2022-06-08 DIAGNOSIS — R2689 Other abnormalities of gait and mobility: Secondary | ICD-10-CM

## 2022-06-08 NOTE — Therapy (Signed)
OUTPATIENT PHYSICAL THERAPY TREATMENT NOTE   Patient Name: [name] MRN: [id number] DOB:[birth year], 81 y.o., female Today's Date: 06/08/2022  PCP: [name] REFERRING PROVIDER: Frederik Pear, MD  END OF SESSION:   PT End of Session - 06/08/22 1144     Visit Number 16    Number of Visits 25    Date for PT Re-Evaluation 07/01/22    Authorization Type [employer] MCR    Authorization Time Period FOTO V6, V10, kx mod v15    PT Start Time 1145    PT Stop Time 1227    PT Time Calculation (min) 42 min    Activity Tolerance Patient tolerated treatment well    Behavior During Therapy WFL for tasks assessed/performed                           Past Medical History:  Diagnosis Date   Anemia    PMH;  Patient denies this dx as of 06/03/21   Anxiety    Asthma    Breast cancer ([facility]) 1988   left breast cancer   Collagenous colitis    Compression fracture    lumbar 1   Dyspnea    with exertion   Hypertension    Hypothyroidism    Osteoarthritis    Osteopenia    Pneumonia 07/2020   PONV (postoperative nausea and vomiting)    Sleep apnea    wears CPAP nightly   Past Surgical History:  Procedure Laterality Date   BREAST REDUCTION SURGERY     Right   CATARACT EXTRACTION W/ INTRAOCULAR LENS  IMPLANT, BILATERAL     CESAREAN SECTION     COLONOSCOPY W/ BIOPSIES AND POLYPECTOMY     CYST EXCISION N/A 08/30/2018   Procedure: excision of posterior shoulder / back sebaceous cyst;  Surgeon: Wallace Going, DO;  Location: [facility];  Service: Plastics;  Laterality: N/A;   KNEE ARTHROSCOPY Left 02/23/2022   Procedure: LEFT KNEE ARTHROSCOPY;  Surgeon: Melrose Nakayama, MD;  Location: [facility];  Service: Orthopedics;  Laterality: Left;   KYPHOPLASTY N/A 02/18/2016   Procedure: LUMBAR 1 KYPHOPLASTY;  Surgeon: Phylliss Bob, MD;  Location: [facility];  Service: Orthopedics;  Laterality: N/A;  LUMBAR 1 KYPHOPLASTY   KYPHOPLASTY N/A 06/04/2021    Procedure: LUMBAR 2 KYPHOPLASTY;  Surgeon: Phylliss Bob, MD;  Location: [facility];  Service: Orthopedics;  Laterality: N/A;   LAPAROSCOPIC APPENDECTOMY N/A 12/09/2021   Procedure: APPENDECTOMY LAPAROSCOPIC;  Surgeon: Ileana Roup, MD;  Location: [facility];  Service: General;  Laterality: N/A;   LUMBAR DISC SURGERY     MODIFIED RADICAL MASTECTOMY W/ AXILLARY LYMPH NODE DISSECTION     Left   RECONSTRUCTION BREAST W/ LATISSIMUS DORSI FLAP     left   REDUCTION MAMMAPLASTY Right    right foot surgery     corrected hammer toe, straightened big toe   SEPTOPLASTY     TOE SURGERY     TONSILLECTOMY AND ADENOIDECTOMY     Patient Active Problem List   Diagnosis Date Noted   Acute appendicitis 12/09/2021   S/P laparoscopic appendectomy 12/09/2021   CAP (community acquired pneumonia) 09/30/2020   Chronic respiratory failure with hypoxia ([facility]) 09/23/2020   Allergic rhinitis 08/31/2019   Pilar cyst 08/04/2018   History of breast cancer in female 06/09/2018   History of reconstruction of both breasts 06/09/2018   Severe obesity (BMI >= 40) ([facility]) 09/16/2015   Asthma with acute  exacerbation 09/15/2015   DOE (dyspnea on exertion) 01/11/2014   Insomnia 05/05/2012   Osteoarthritis    Osteopenia    Collagenous colitis    OSA (obstructive sleep apnea)    Hypothyroidism    Mild persistent asthma    Hypertension    Breast cancer, left breast ([facility])     REFERRING DIAG: s/p left knee scope  THERAPY DIAG:  Chronic pain of left knee  Muscle weakness (generalized)  Other abnormalities of gait and mobility  Rationale for Evaluation and Treatment Rehabilitation  PERTINENT HISTORY: Lt knee partial lateral meniscectomy on 02/23/22, breast cx, anxiety, asthma, compression fx, osteopenia  PRECAUTIONS: Falls  SUBJECTIVE: "I feel like I'm doing better. It was easier to walk in here today."  PAIN:  Are you having pain? Yes: NPRS scale: 2/10 Pain location: anterior Lt knee Pain description:  ache Aggravating factors: prolonged standing/walking, activity in general Relieving factors: rest with pillow under the knee   OBJECTIVE: (objective measures completed at initial evaluation unless otherwise dated)   DIAGNOSTIC FINDINGS:  Lt knee MRI: IMPRESSION: 1. Advanced tricompartmental osteoarthritis, most severe within the lateral compartment. 2. Advanced degeneration and tearing throughout the lateral meniscus. 3. Small joint effusion with multiple intra-articular loose bodies.   PATIENT SURVEYS:  FOTO 37% function to 52% predicted  04/22/2022: 36%  05/04/2022: 33%   COGNITION:           Overall cognitive status: Within functional limits for tasks assessed                          SENSATION: Not tested   EDEMA:  No obvious swelling about the Lt knee      POSTURE: rounded shoulders and forward head   PALPATION: TTP lateral joint line    LOWER EXTREMITY ROM:   Active ROM Right eval Left eval Right 04/09/2022 Left 04/09/2022 Left 04/15/2022 Left 04/22/2022 Left 04/29/2022 Left 05/13/2022 06/08/22 Left   Hip flexion             Hip extension             Hip abduction             Hip adduction             Hip internal rotation             Hip external rotation             Knee flexion 120 109 113 95, 102 following  AAROM 101, 107 following AAROM 104, 109 following AAROM 113 AROM, 115 PROM 105 AROM, 116 PROM following AAROM 106  Knee extension Unable to accurately assess due to hip flexor tightness  Unable to accurately assess due to hip flexor tightness  -7 -20    -9 AROM   Ankle dorsiflexion             Ankle plantarflexion             Ankle inversion             Ankle eversion              (Blank rows = not tested)   LOWER EXTREMITY MMT:   MMT Right eval Left eval Left 04/22/2022 06/01/22 Left   Hip flexion 4+ 4  4+  Hip extension        Hip abduction        Hip adduction        Hip internal  rotation        Hip external rotation        Knee  flexion 5 4+ 5/5   Knee extension 5 3- 5/5   Ankle dorsiflexion        Ankle plantarflexion        Ankle inversion        Ankle eversion         (Blank rows = not tested)   LOWER EXTREMITY SPECIAL TESTS:  None    FUNCTIONAL TESTS:  5 x STS 14.65 seconds TUG 23.8 seconds   04/22/2022: TUG: 16 seconds 5xSTS: 12.5 seconds  05/13/2022: TUG: 15 seconds 6MWT: 350 ft, one rest break at 4 minutes to 5 minutes    GAIT: Distance walked: 10 ft  Assistive device utilized: Single point cane Level of assistance: Modified independence Comments: limited knee flexion during stance and swing; WBOS       TODAY'S TREATMENT: [facility] Adult PT Treatment:                                                DATE: 06/08/22 Therapeutic Exercise: NuStep level 6 x 5 minutes  Sit to stand from raised no UE support 2 x 10  TKE blue band 2 x 10  Standing hip extension 2 x 10; 1# Standing hip abduction 2 x 10; 1# Standing HS curl 2 x10; 1# Standing march 2 x 10; 1# LAQ 2 x 10; 3#    [facility] Adult PT Treatment:                                                DATE: 06/03/22 Therapeutic Exercise: NuStep level 5 x 5 minutes UE/LE Seated resisted hip abduction green band 2 x 10  Seated resisted march green band 2 x 10   Standing calf raise 2 x 15  Standing hip abduction 2 x 10  Standing hip extension 2 x 10  Standing march 2 x 10  Updated HEP    [facility] Adult PT Treatment:                                                DATE: 06/01/22 Therapeutic Exercise: NuStep level 5 x 5 minutes UE/LE Heel slides with strap x 10; LLE SLR 2 x 10  Sidelying hip abduction 2 x 10  LAQ 2 x 10; 2.5 lbs  Reviewed HEP    PATIENT EDUCATION:  Education details: n/a Person educated: n/a Education method: n/a Education comprehension: n/a     HOME EXERCISE PROGRAM: Access Code: [email or web address] URL: [email or web address] Date: 04/01/2022 Prepared by: Gwendolyn Grant   Exercises - Standing March with Counter  Support  - 2 x daily - 7 x weekly - 2 sets - 10 reps - Supine Heel Slide  - 2 x daily - 7 x weekly - 1 sets - 10 reps - 5 sec  hold - Supine Quad Set  - 2 x daily - 7 x weekly - 2 sets - 10 reps - 5 sec  hold - Seated Knee Extension AAROM  - 2 x daily - 7  x weekly - 1 sets - 10 reps - 5 sec  hold  Added 04/22/2022: - Seated Long Arc Quad  - 1 x daily - 7 x weekly - 3 sets - 10 reps - 3 seconds hold - Supine Core Bicycle  - 1 x daily - 7 x weekly - 3 sets - 12 reps   ASSESSMENT:   CLINICAL IMPRESSION: Patient arrives with minimal knee pain. Her knee flexion AROM is relatively unchanged from previous assessment. Session focused on progression of standing strengthening, which she tolerated well without an increase in knee pain. She is able to complete sit to stand from raised height without UE support, but requires occasional cues for slow, controlled descent. Less seated rest breaks needed today compared to previous visit.      OBJECTIVE IMPAIRMENTS Abnormal gait, decreased activity tolerance, decreased balance, difficulty walking, decreased ROM, decreased strength, improper body mechanics, postural dysfunction, and pain.    ACTIVITY LIMITATIONS carrying, lifting, bending, standing, squatting, stairs, transfers, and locomotion level   PARTICIPATION LIMITATIONS: meal prep, cleaning, laundry, shopping, and community activity   PERSONAL FACTORS Age, Fitness, Time since onset of injury/illness/exacerbation, and 3+ comorbidities: see PMH above  are also affecting patient's functional outcome.        GOALS: Goals reviewed with patient? Yes   SHORT TERM GOALS: Target date: 04/23/22 Patient will be independent and compliant with initial HEP.    Baseline: issued at eval  04/15/2022: Pt reports adherence to her HEP Goal status: ACHIEVED   2.  Patient will demonstrate at least 120 degrees of Lt knee flexion AROM to improve gait mechanics.  Baseline: see above  Status: see chart above  Goal  status: ongoing    3.  Patient will complete TUG in </= 18 seconds to signify a reduction in fall risk.  Baseline: see above  04/21/2022: 16 seconds 05/13/2022: 15 seconds Goal status: ACHIEVED     LONG TERM GOALS: Target date: 05/14/22   Patient will demonstrate at least 4/5 Lt knee extension strength to improve ability to complete stair and curb negotiation.  Baseline: see above  04/22/2022: 5/5 Goal status: ACHIEVED   2.  Patient will report pain at worst rated as </=5/10 to reduce her current functional limitations.  Baseline: 8/10  04/22/2022: 5/10 worst pain Goal status: ACHIEVED   3.  Patient will be able to participate in community activity independently.  Baseline: unable to attend community events independently at this time due to pain.  04/22/2022: Pt reports ability to partake in community activity such as going to plays, but not grocery shopping Goal status: IN PROGRESS   4.  Patient will score at least 52% on FOTO to signify clinically meaningful improvement in functional abilities.    Baseline: 37 04/22/2022: 36% 05/04/2022: 33% Goal status: IN PROGRESS  5.  *New 05/13/2022* Patient will ambulate 550 feet in  a 6-minute walk test in order to demonstrate safety with functional community ambulation.    Baseline (05/13/2022): 350 ft, one rest break at 4 minutes to 5 minutes Goal status: INITIAL     PLAN: PT FREQUENCY: 2x/week   PT DURATION: 6 weeks   PLANNED INTERVENTIONS: Therapeutic exercises, Therapeutic activity, Neuromuscular re-education, Balance training, Gait training, Patient/Family education, Self Care, Joint mobilization, Stair training, Aquatic Therapy, Dry Needling, Cryotherapy, Moist heat, Vasopneumatic device, Manual therapy, and Re-evaluation   PLAN FOR NEXT SESSION: review and progress HEP prn; manual to knee; focus on restoring flexion, hip flexor stretching, quad strengthening.   Gwendolyn Grant,  PT, DPT, ATC 06/08/22 12:29 PM

## 2022-06-09 ENCOUNTER — Other Ambulatory Visit: Payer: Self-pay

## 2022-06-09 DIAGNOSIS — I10 Essential (primary) hypertension: Secondary | ICD-10-CM

## 2022-06-09 MED ORDER — HYDRALAZINE HCL 50 MG PO TABS: 50.0000 mg | ORAL_TABLET | Freq: Three times a day (TID) | ORAL | 3 refills | Status: DC

## 2022-06-10 ENCOUNTER — Ambulatory Visit: Payer: Medicare HMO

## 2022-06-12 IMAGING — MR MR SHOULDER*R* WO/W CM
9 series · 40 of 40 positions shown · IV contrast (20 ml multihance)
Comparison: None.

CLINICAL DATA: Right shoulder pain for 3 months

EXAM:
MRI OF THE RIGHT SHOULDER WITHOUT AND WITH CONTRAST
TECHNIQUE: Multiplanar, multisequence MR imaging of the right shoulder was
performed before and after the administration of intravenous
contrast.
CONTRAST:  20mL MULTIHANCE GADOBENATE DIMEGLUMINE 529 MG/ML IV SOLN

[Series 6: T2 fat-sat · axial · right · 3.0mm · 0.47mm/px · z∈[-36,+59]mm · 5 of 27 slices shown (1 of 3)]
[im 1/27]
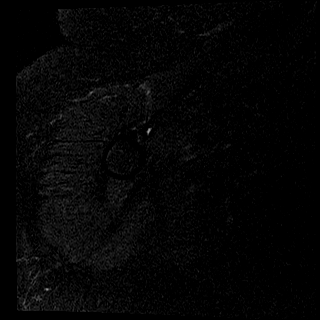
[im 7/27]
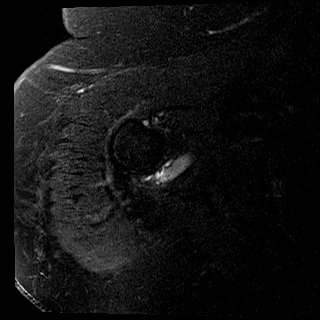
[im 14/27]
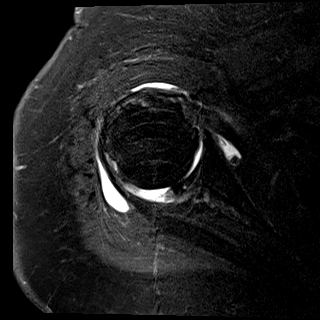
[im 20/27]
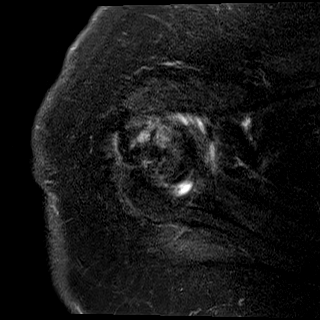
[im 27/27]
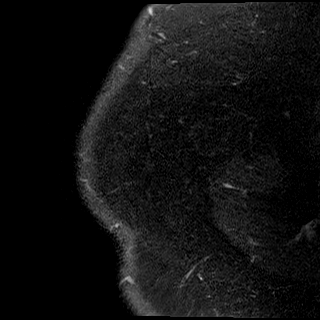

[Series 7: T2 fat-sat · sagittal · right · 3.0mm · 0.44mm/px · 5 of 28 slices shown (2 of 3)]
[im 1/28]
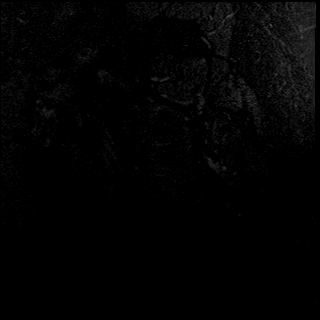
[im 7/28]
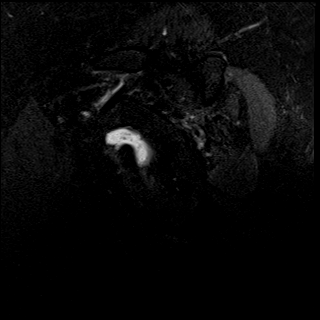
[im 14/28]
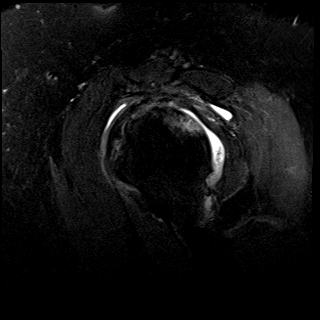
[im 21/28]
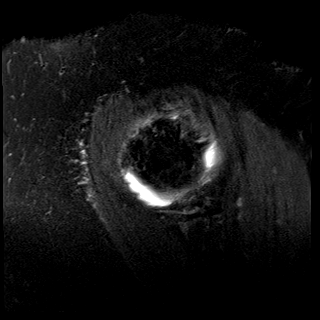
[im 28/28]
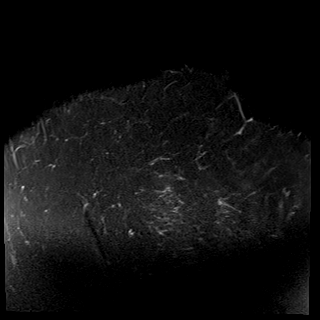

[Series 8: T1 · sagittal · right · 3.0mm · 0.55mm/px · 5 of 28 slices shown]
[im 1/28]
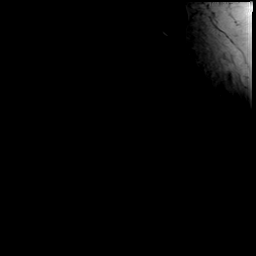
[im 7/28]
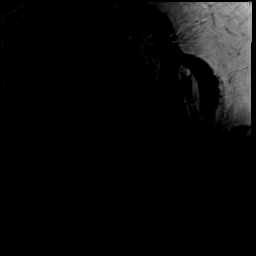
[im 14/28]
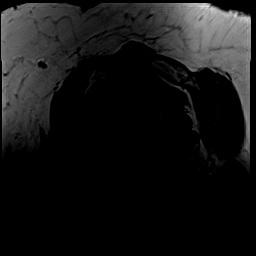
[im 21/28]
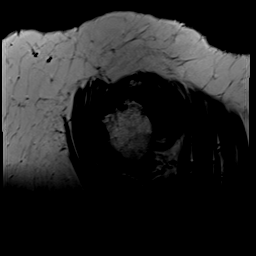
[im 28/28]
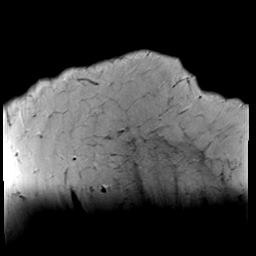

[Series 9: PD · coronal · right · 3.0mm · 0.44mm/px · 4 of 26 slices shown]
[im 1/26]
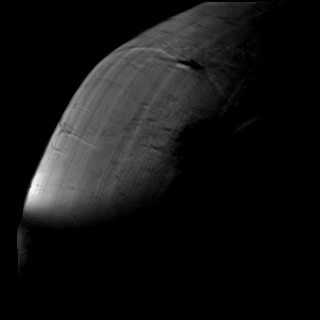
[im 9/26]
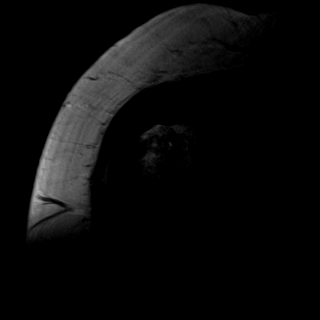
[im 17/26]
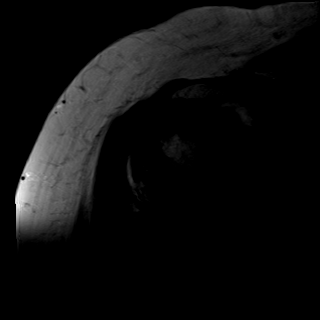
[im 26/26]
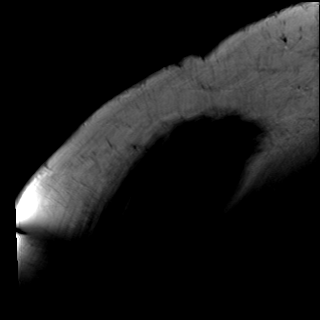

[Series 10: T2 fat-sat · coronal · right · 3.0mm · 0.44mm/px · 4 of 26 slices shown (3 of 3)]
[im 1/26]
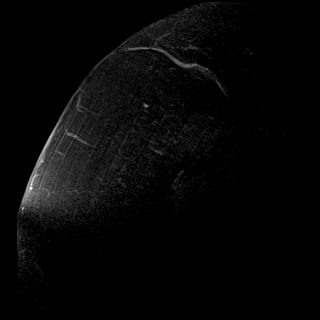
[im 9/26]
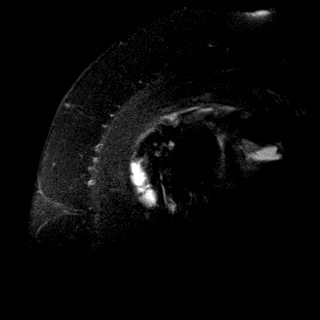
[im 17/26]
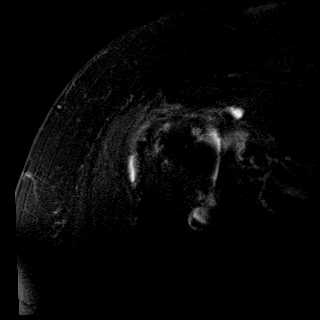
[im 26/26]
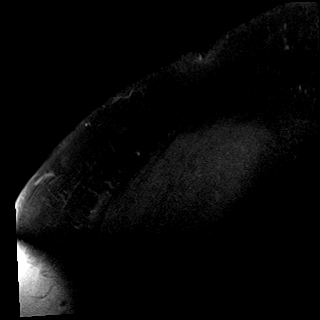

[Series 11: T1 fat-sat · axial · right · 3.0mm · 0.44mm/px · z∈[-38,+58]mm · 4 of 27 slices shown (1 of 4)]
[im 1/27]
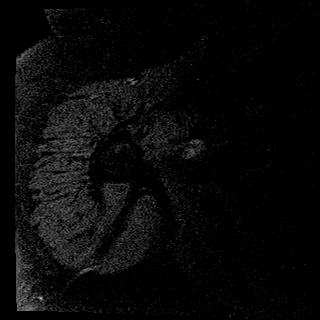
[im 9/27]
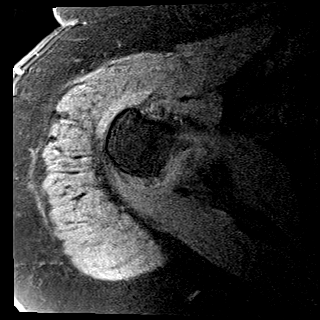
[im 18/27]
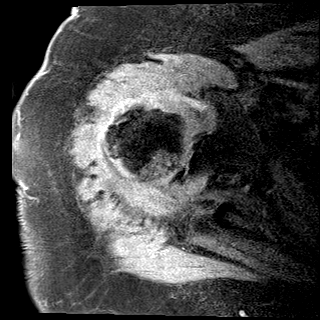
[im 27/27]
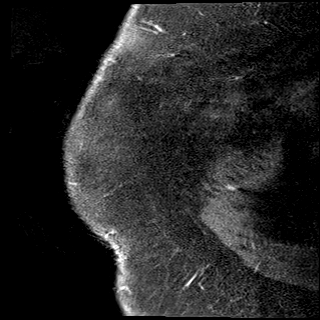

[Series 12: T1 fat-sat · axial · right · 3.0mm · 0.44mm/px · z∈[-38,+58]mm · 4 of 27 slices shown (2 of 4)]
[im 1/27]
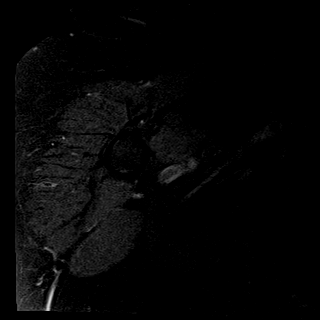
[im 9/27]
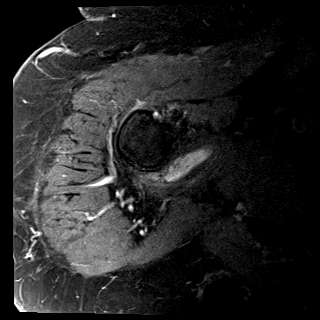
[im 18/27]
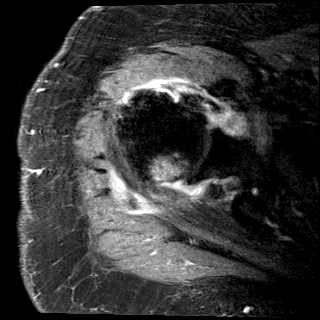
[im 27/27]
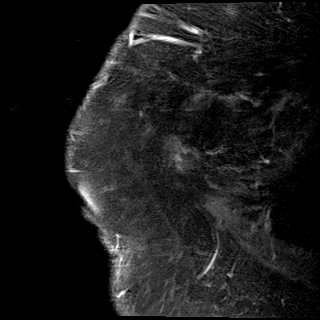

[Series 13: T1 fat-sat · coronal · right · 3.0mm · 0.55mm/px · 4 of 26 slices shown (3 of 4)]
[im 1/26]
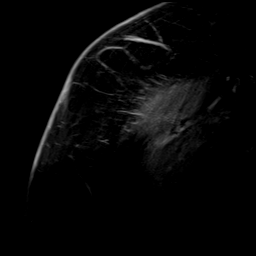
[im 9/26]
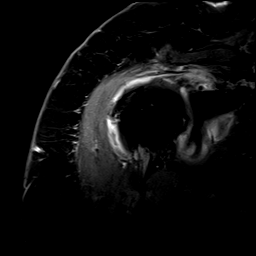
[im 17/26]
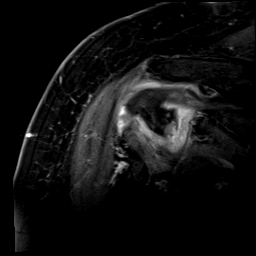
[im 26/26]
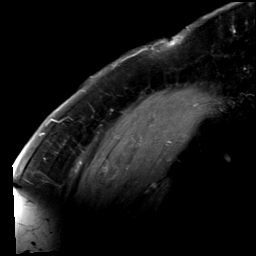

[Series 14: T1 fat-sat · sagittal · right · 3.0mm · 0.55mm/px · 5 of 28 slices shown (4 of 4)]
[im 1/28]
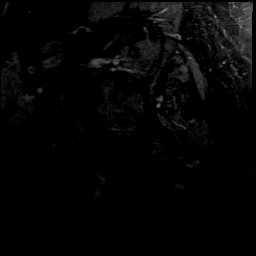
[im 7/28]
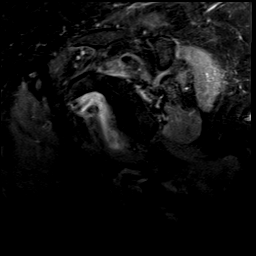
[im 14/28]
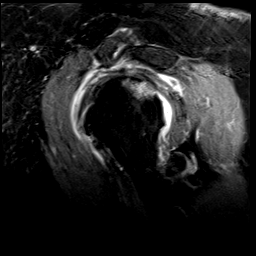
[im 21/28]
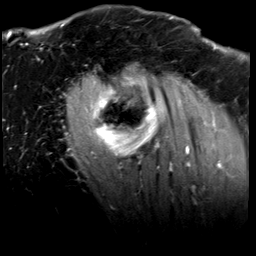
[im 28/28]
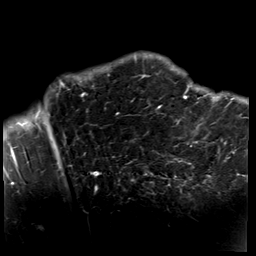

[40 of 40 positions shown; findings below may reference images not displayed]

FINDINGS: Technical note: Motion degraded exam.

Rotator cuff: Chronic appearing full-thickness, full width tears of
the supraspinatus and infraspinatus tendons with retraction to the
level of the glenohumeral joint. Intact subscapularis tendon with
moderate tendinosis. Intact teres minor.

Muscles: Atrophy and fatty infiltration of the supraspinatus and
infraspinatus muscles.

Biceps long head: Intra-articular portion of the biceps tendon is
not definitively seen, and may be torn.

Acromioclavicular Joint: Mild-moderate arthropathy of the AC joint.
Small-moderate volume subacromial-subdeltoid bursitis.

Glenohumeral Joint: Small glenohumeral joint effusion with
synovitis. Moderate diffuse chondral thinning and surface
irregularity with marginal osteophytosis.

Labrum: Circumferentially degenerated and torn. No paralabral cyst.

Bones: There is irregular linear low T1 signal intensity within the
posteromedial aspect of the humeral head (series 8, image 14) with
surrounding bone marrow edema compatible with subchondral fracture,
possibly secondary to avascular necrosis. No appreciable cortical
collapse. No additional fracture. No dislocation. No suspicious bone
lesion.

Other: None.
IMPRESSION: 1. Acute subchondral fracture of the posteromedial aspect of the
humeral head, which may be secondary to avascular necrosis.
2. Chronic appearing full-thickness, full-width tears of the
supraspinatus and infraspinatus tendons with retraction to the level
of the glenohumeral joint. Atrophy and fatty infiltration of the
supraspinatus and infraspinatus muscles.
3. Moderate glenohumeral and mild-to-moderate AC joint
osteoarthritis.
4. Small glenohumeral joint effusion with synovitis.
5. Small-moderate volume subacromial-subdeltoid bursitis.
6. Intra-articular portion of the biceps tendon is not definitively
seen, and may be torn.

These results will be called to the ordering clinician or
representative by the Radiologist Assistant, and communication
documented in the PACS or [REDACTED].

## 2022-06-15 ENCOUNTER — Ambulatory Visit: Payer: Medicare HMO

## 2022-06-15 DIAGNOSIS — G8929 Other chronic pain: Secondary | ICD-10-CM

## 2022-06-15 DIAGNOSIS — R2689 Other abnormalities of gait and mobility: Secondary | ICD-10-CM

## 2022-06-15 DIAGNOSIS — M6281 Muscle weakness (generalized): Secondary | ICD-10-CM

## 2022-06-15 DIAGNOSIS — M25562 Pain in left knee: Secondary | ICD-10-CM | POA: Diagnosis not present

## 2022-06-15 NOTE — Therapy (Signed)
OUTPATIENT PHYSICAL THERAPY TREATMENT NOTE   Patient Name: [name] MRN: [id number] DOB:22-Oct-1940, 81 y.o., female Today's Date: 06/15/2022  PCP: [name] REFERRING PROVIDER: Frederik Pear, MD  END OF SESSION:   PT End of Session - 06/15/22 1145     Visit Number 17    Number of Visits 25    Date for PT Re-Evaluation 07/01/22    Authorization Type [employer] MCR    Authorization Time Period FOTO V6, V10, kx mod v15    PT Start Time 1145    PT Stop Time 1229    PT Time Calculation (min) 44 min    Equipment Utilized During Treatment Gait belt    Activity Tolerance Patient tolerated treatment well    Behavior During Therapy WFL for tasks assessed/performed                            Past Medical History:  Diagnosis Date   Anemia    PMH;  Patient denies this dx as of 06/03/21   Anxiety    Asthma    Breast cancer ([place]) 1988   left breast cancer   Collagenous colitis    Compression fracture    lumbar 1   Dyspnea    with exertion   Hypertension    Hypothyroidism    Osteoarthritis    Osteopenia    Pneumonia 07/2020   PONV (postoperative nausea and vomiting)    Sleep apnea    wears CPAP nightly   Past Surgical History:  Procedure Laterality Date   BREAST REDUCTION SURGERY     Right   CATARACT EXTRACTION W/ INTRAOCULAR LENS  IMPLANT, BILATERAL     CESAREAN SECTION     COLONOSCOPY W/ BIOPSIES AND POLYPECTOMY     CYST EXCISION N/A 08/30/2018   Procedure: excision of posterior shoulder / back sebaceous cyst;  Surgeon: Wallace Going, DO;  Location: [place];  Service: Plastics;  Laterality: N/A;   KNEE ARTHROSCOPY Left 02/23/2022   Procedure: LEFT KNEE ARTHROSCOPY;  Surgeon: Melrose Nakayama, MD;  Location: [facility];  Service: Orthopedics;  Laterality: Left;   KYPHOPLASTY N/A 02/18/2016   Procedure: LUMBAR 1 KYPHOPLASTY;  Surgeon: Phylliss Bob, MD;  Location: [facility];  Service: Orthopedics;  Laterality: N/A;  LUMBAR  1 KYPHOPLASTY   KYPHOPLASTY N/A 06/04/2021   Procedure: LUMBAR 2 KYPHOPLASTY;  Surgeon: Phylliss Bob, MD;  Location: [facility];  Service: Orthopedics;  Laterality: N/A;   LAPAROSCOPIC APPENDECTOMY N/A 12/09/2021   Procedure: APPENDECTOMY LAPAROSCOPIC;  Surgeon: Ileana Roup, MD;  Location: [place];  Service: General;  Laterality: N/A;   LUMBAR DISC SURGERY     MODIFIED RADICAL MASTECTOMY W/ AXILLARY LYMPH NODE DISSECTION     Left   RECONSTRUCTION BREAST W/ LATISSIMUS DORSI FLAP     left   REDUCTION MAMMAPLASTY Right    right foot surgery     corrected hammer toe, straightened big toe   SEPTOPLASTY     TOE SURGERY     TONSILLECTOMY AND ADENOIDECTOMY     Patient Active Problem List   Diagnosis Date Noted   Acute appendicitis 12/09/2021   S/P laparoscopic appendectomy 12/09/2021   CAP (community acquired pneumonia) 09/30/2020   Chronic respiratory failure with hypoxia ([place]) 09/23/2020   Allergic rhinitis 08/31/2019   Pilar cyst 08/04/2018   History of breast cancer in female 06/09/2018   History of reconstruction of both breasts 06/09/2018   Severe obesity (  BMI >= 40) ([place]) 09/16/2015   Asthma with acute exacerbation 09/15/2015   DOE (dyspnea on exertion) 01/11/2014   Insomnia 05/05/2012   Osteoarthritis    Osteopenia    Collagenous colitis    OSA (obstructive sleep apnea)    Hypothyroidism    Mild persistent asthma    Hypertension    Breast cancer, left breast ([place])     REFERRING DIAG: s/p left knee scope  THERAPY DIAG:  Chronic pain of left knee  Muscle weakness (generalized)  Other abnormalities of gait and mobility  Rationale for Evaluation and Treatment Rehabilitation  PERTINENT HISTORY: Lt knee partial lateral meniscectomy on 02/23/22, breast cx, anxiety, asthma, compression fx, osteopenia  PRECAUTIONS: Falls  SUBJECTIVE: "My knee is doing good. It's my back today."   PAIN:  Are you having pain? Yes: NPRS scale: 1/10 (knee); 4/10 (low back) Pain  location: anterior Lt knee; low back  Pain description: ache Aggravating factors: prolonged standing/walking, activity in general Relieving factors: rest with pillow under the knee   OBJECTIVE: (objective measures completed at initial evaluation unless otherwise dated)   DIAGNOSTIC FINDINGS:  Lt knee MRI: IMPRESSION: 1. Advanced tricompartmental osteoarthritis, most severe within the lateral compartment. 2. Advanced degeneration and tearing throughout the lateral meniscus. 3. Small joint effusion with multiple intra-articular loose bodies.   PATIENT SURVEYS:  FOTO 37% function to 52% predicted  04/22/2022: 36%  05/04/2022: 33%   COGNITION:           Overall cognitive status: Within functional limits for tasks assessed                          SENSATION: Not tested   EDEMA:  No obvious swelling about the Lt knee      POSTURE: rounded shoulders and forward head   PALPATION: TTP lateral joint line    LOWER EXTREMITY ROM:   Active ROM Right eval Left eval Right 04/09/2022 Left 04/09/2022 Left 04/15/2022 Left 04/22/2022 Left 04/29/2022 Left 05/13/2022 06/08/22 Left   Hip flexion             Hip extension             Hip abduction             Hip adduction             Hip internal rotation             Hip external rotation             Knee flexion 120 109 113 95, 102 following  AAROM 101, 107 following AAROM 104, 109 following AAROM 113 AROM, 115 PROM 105 AROM, 116 PROM following AAROM 106  Knee extension Unable to accurately assess due to hip flexor tightness  Unable to accurately assess due to hip flexor tightness  -7 -20    -9 AROM   Ankle dorsiflexion             Ankle plantarflexion             Ankle inversion             Ankle eversion              (Blank rows = not tested)   LOWER EXTREMITY MMT:   MMT Right eval Left eval Left 04/22/2022 06/01/22 Left   Hip flexion 4+ 4  4+  Hip extension        Hip abduction  Hip adduction        Hip  internal rotation        Hip external rotation        Knee flexion 5 4+ 5/5   Knee extension 5 3- 5/5   Ankle dorsiflexion        Ankle plantarflexion        Ankle inversion        Ankle eversion         (Blank rows = not tested)   LOWER EXTREMITY SPECIAL TESTS:  None    FUNCTIONAL TESTS:  5 x STS 14.65 seconds TUG 23.8 seconds   04/22/2022: TUG: 16 seconds 5xSTS: 12.5 seconds  05/13/2022: TUG: 15 seconds 6MWT: 350 ft, one rest break at 4 minutes to 5 minutes 06/15/22: 480 ft 6 MWT     GAIT: Distance walked: 10 ft  Assistive device utilized: Single point cane Level of assistance: Modified independence Comments: limited knee flexion during stance and swing; WBOS       TODAY'S TREATMENT: [facility] Adult PT Treatment:                                                DATE: 06/15/22 Therapeutic Exercise: NuStep level 6 x 5 minutes 6 MWT  Standing hip extension 2 x 10; 2.5 # Standing hip abduction 2 x 10; 2.5# Standing HS curl 2 x 10; 2.5 # Standing march 2 x 10; 2.5 # Updated HEP to include strengthening and walking program.    [facility] Adult PT Treatment:                                                DATE: 06/08/22 Therapeutic Exercise: NuStep level 6 x 5 minutes  Sit to stand from raised no UE support 2 x 10  TKE blue band 2 x 10  Standing hip extension 2 x 10; 1# Standing hip abduction 2 x 10; 1# Standing HS curl 2 x10; 1# Standing march 2 x 10; 1# LAQ 2 x 10; 3#    [facility] Adult PT Treatment:                                                DATE: 06/03/22 Therapeutic Exercise: NuStep level 5 x 5 minutes UE/LE Seated resisted hip abduction green band 2 x 10  Seated resisted march green band 2 x 10   Standing calf raise 2 x 15  Standing hip abduction 2 x 10  Standing hip extension 2 x 10  Standing march 2 x 10  Updated HEP    [facility] Adult PT Treatment:                                                DATE: 06/01/22 Therapeutic Exercise: NuStep level 5 x 5 minutes  UE/LE Heel slides with strap x 10; LLE SLR 2 x 10  Sidelying hip abduction 2 x 10  LAQ 2 x 10; 2.5 lbs  Reviewed HEP    PATIENT EDUCATION:  Education details: HEP; walking program  Person educated: patient Education method: demo, cues, handout Education comprehension: returned demo, cues     HOME EXERCISE PROGRAM: Access Code: [email or web address] URL: [email or web address] Date: 04/01/2022 Prepared by: Gwendolyn Grant   Exercises - Standing March with Counter Support  - 2 x daily - 7 x weekly - 2 sets - 10 reps - Supine Heel Slide  - 2 x daily - 7 x weekly - 1 sets - 10 reps - 5 sec  hold - Supine Quad Set  - 2 x daily - 7 x weekly - 2 sets - 10 reps - 5 sec  hold - Seated Knee Extension AAROM  - 2 x daily - 7 x weekly - 1 sets - 10 reps - 5 sec  hold  Added 04/22/2022: - Seated Long Arc Quad  - 1 x daily - 7 x weekly - 3 sets - 10 reps - 3 seconds hold - Supine Core Bicycle  - 1 x daily - 7 x weekly - 3 sets - 12 reps   ASSESSMENT:   CLINICAL IMPRESSION: Patient reports minimal knee pain upon arrival. She has significantly improved her distance with 6 MWT requiring only a few short standing rest breaks during test. Focused on progression of standing strengthening with patient demonstrating improved standing endurance requiring occasional standing rest breaks between exercises. HEP was updated to include further standing strengthening as well as handout on walking program to begin to implement as part of HEP to progress her walking endurance. No reports of increased knee pain throughout session.      OBJECTIVE IMPAIRMENTS Abnormal gait, decreased activity tolerance, decreased balance, difficulty walking, decreased ROM, decreased strength, improper body mechanics, postural dysfunction, and pain.    ACTIVITY LIMITATIONS carrying, lifting, bending, standing, squatting, stairs, transfers, and locomotion level   PARTICIPATION LIMITATIONS: meal prep, cleaning, laundry, shopping,  and community activity   PERSONAL FACTORS Age, Fitness, Time since onset of injury/illness/exacerbation, and 3+ comorbidities: see PMH above  are also affecting patient's functional outcome.        GOALS: Goals reviewed with patient? Yes   SHORT TERM GOALS: Target date: 04/23/22 Patient will be independent and compliant with initial HEP.    Baseline: issued at eval  04/15/2022: Pt reports adherence to her HEP Goal status: ACHIEVED   2.  Patient will demonstrate at least 120 degrees of Lt knee flexion AROM to improve gait mechanics.  Baseline: see above  Status: see chart above  Goal status: ongoing    3.  Patient will complete TUG in </= 18 seconds to signify a reduction in fall risk.  Baseline: see above  04/21/2022: 16 seconds 05/13/2022: 15 seconds Goal status: ACHIEVED     LONG TERM GOALS: Target date: 05/14/22   Patient will demonstrate at least 4/5 Lt knee extension strength to improve ability to complete stair and curb negotiation.  Baseline: see above  04/22/2022: 5/5 Goal status: ACHIEVED   2.  Patient will report pain at worst rated as </=5/10 to reduce her current functional limitations.  Baseline: 8/10  04/22/2022: 5/10 worst pain Goal status: ACHIEVED   3.  Patient will be able to participate in community activity independently.  Baseline: unable to attend community events independently at this time due to pain.  04/22/2022: Pt reports ability to partake in community activity such as going to plays, but not grocery shopping Goal status: IN PROGRESS   4.  Patient will score at  least 52% on FOTO to signify clinically meaningful improvement in functional abilities.    Baseline: 37 04/22/2022: 36% 05/04/2022: 33% Goal status: IN PROGRESS  5.  *New 05/13/2022* Patient will ambulate 550 feet in  a 6-minute walk test in order to demonstrate safety with functional community ambulation.    Baseline (05/13/2022): 350 ft, one rest break at 4 minutes to 5  minutes Goal status: ongoing     PLAN: PT FREQUENCY: 2x/week   PT DURATION: 6 weeks   PLANNED INTERVENTIONS: Therapeutic exercises, Therapeutic activity, Neuromuscular re-education, Balance training, Gait training, Patient/Family education, Self Care, Joint mobilization, Stair training, Aquatic Therapy, Dry Needling, Cryotherapy, Moist heat, Vasopneumatic device, Manual therapy, and Re-evaluation   PLAN FOR NEXT SESSION: review and progress HEP prn; manual to knee; focus on restoring flexion, hip flexor stretching, quad strengthening.   Gwendolyn Grant, PT, DPT, ATC 06/15/22 12:30 PM

## 2022-06-17 ENCOUNTER — Ambulatory Visit: Payer: Medicare HMO

## 2022-06-17 DIAGNOSIS — G8929 Other chronic pain: Secondary | ICD-10-CM

## 2022-06-17 DIAGNOSIS — M6281 Muscle weakness (generalized): Secondary | ICD-10-CM

## 2022-06-17 DIAGNOSIS — R2689 Other abnormalities of gait and mobility: Secondary | ICD-10-CM

## 2022-06-17 DIAGNOSIS — M25562 Pain in left knee: Secondary | ICD-10-CM | POA: Diagnosis not present

## 2022-06-17 NOTE — Therapy (Signed)
OUTPATIENT PHYSICAL THERAPY TREATMENT NOTE   Patient Name: [name] MRN: [id number] DOB:[birth year], 81 y.o., female Today's Date: 06/17/2022  PCP: [name] REFERRING PROVIDER: Frederik Pear, MD  END OF SESSION:   PT End of Session - 06/17/22 1358     Visit Number 18    Number of Visits 25    Date for PT Re-Evaluation 07/01/22    Authorization Type [employer] MCR    Authorization Time Period FOTO V6, V10, kx mod v15    PT Start Time 1400    PT Stop Time 1440    PT Time Calculation (min) 40 min    Equipment Utilized During Treatment Gait belt    Activity Tolerance Patient tolerated treatment well    Behavior During Therapy WFL for tasks assessed/performed                             Past Medical History:  Diagnosis Date   Anemia    PMH;  Patient denies this dx as of 06/03/21   Anxiety    Asthma    Breast cancer ([place]) 1988   left breast cancer   Collagenous colitis    Compression fracture    lumbar 1   Dyspnea    with exertion   Hypertension    Hypothyroidism    Osteoarthritis    Osteopenia    Pneumonia 07/2020   PONV (postoperative nausea and vomiting)    Sleep apnea    wears CPAP nightly   Past Surgical History:  Procedure Laterality Date   BREAST REDUCTION SURGERY     Right   CATARACT EXTRACTION W/ INTRAOCULAR LENS  IMPLANT, BILATERAL     CESAREAN SECTION     COLONOSCOPY W/ BIOPSIES AND POLYPECTOMY     CYST EXCISION N/A 08/30/2018   Procedure: excision of posterior shoulder / back sebaceous cyst;  Surgeon: Wallace Going, DO;  Location: [place] [facility];  Service: Plastics;  Laterality: N/A;   KNEE ARTHROSCOPY Left 02/23/2022   Procedure: LEFT KNEE ARTHROSCOPY;  Surgeon: Melrose Nakayama, MD;  Location[facility];  Service: Orthopedics;  Laterality: Left;   KYPHOPLASTY N/A 02/18/2016   Procedure: LUMBAR 1 KYPHOPLASTY;  Surgeon: Phylliss Bob, MD;  Location: [place];  Service: Orthopedics;  Laterality: N/A;   LUMBAR 1 KYPHOPLASTY   KYPHOPLASTY N/A 06/04/2021   Procedure: LUMBAR 2 KYPHOPLASTY;  Surgeon: Phylliss Bob, MD;  Location: [place];  Service: Orthopedics;  Laterality: N/A;   LAPAROSCOPIC APPENDECTOMY N/A 12/09/2021   Procedure: APPENDECTOMY LAPAROSCOPIC;  Surgeon: Ileana Roup, MD;  Location: [facility];  Service: General;  Laterality: N/A;   LUMBAR DISC SURGERY     MODIFIED RADICAL MASTECTOMY W/ AXILLARY LYMPH NODE DISSECTION     Left   RECONSTRUCTION BREAST W/ LATISSIMUS DORSI FLAP     left   REDUCTION MAMMAPLASTY Right    right foot surgery     corrected hammer toe, straightened big toe   SEPTOPLASTY     TOE SURGERY     TONSILLECTOMY AND ADENOIDECTOMY     Patient Active Problem List   Diagnosis Date Noted   Acute appendicitis 12/09/2021   S/P laparoscopic appendectomy 12/09/2021   CAP (community acquired pneumonia) 09/30/2020   Chronic respiratory failure with hypoxia ([place]) 09/23/2020   Allergic rhinitis 08/31/2019   Pilar cyst 08/04/2018   History of breast cancer in female 06/09/2018   History of reconstruction of both breasts 06/09/2018   Severe  obesity (BMI >= 40) ([place]) 09/16/2015   Asthma with acute exacerbation 09/15/2015   DOE (dyspnea on exertion) 01/11/2014   Insomnia 05/05/2012   Osteoarthritis    Osteopenia    Collagenous colitis    OSA (obstructive sleep apnea)    Hypothyroidism    Mild persistent asthma    Hypertension    Breast cancer, left breast ([place])     REFERRING DIAG: s/p left knee scope  THERAPY DIAG:  Chronic pain of left knee  Muscle weakness (generalized)  Other abnormalities of gait and mobility  Rationale for Evaluation and Treatment Rehabilitation  PERTINENT HISTORY: Lt knee partial lateral meniscectomy on 02/23/22, breast cx, anxiety, asthma, compression fx, osteopenia  PRECAUTIONS: Falls  SUBJECTIVE: Patient reports her knee is feeling good right now without pain. She is interested in using her exercise bike at home.   PAIN:   Are you having pain? No    OBJECTIVE: (objective measures completed at initial evaluation unless otherwise dated)   DIAGNOSTIC FINDINGS:  Lt knee MRI: IMPRESSION: 1. Advanced tricompartmental osteoarthritis, most severe within the lateral compartment. 2. Advanced degeneration and tearing throughout the lateral meniscus. 3. Small joint effusion with multiple intra-articular loose bodies.   PATIENT SURVEYS:  FOTO 37% function to 52% predicted  04/22/2022: 36%  05/04/2022: 33%   COGNITION:           Overall cognitive status: Within functional limits for tasks assessed                          SENSATION: Not tested   EDEMA:  No obvious swelling about the Lt knee      POSTURE: rounded shoulders and forward head   PALPATION: TTP lateral joint line    LOWER EXTREMITY ROM:   Active ROM Right eval Left eval Right 04/09/2022 Left 04/09/2022 Left 04/15/2022 Left 04/22/2022 Left 04/29/2022 Left 05/13/2022 06/08/22 Left   Hip flexion             Hip extension             Hip abduction             Hip adduction             Hip internal rotation             Hip external rotation             Knee flexion 120 109 113 95, 102 following  AAROM 101, 107 following AAROM 104, 109 following AAROM 113 AROM, 115 PROM 105 AROM, 116 PROM following AAROM 106  Knee extension Unable to accurately assess due to hip flexor tightness  Unable to accurately assess due to hip flexor tightness  -7 -20    -9 AROM   Ankle dorsiflexion             Ankle plantarflexion             Ankle inversion             Ankle eversion              (Blank rows = not tested)   LOWER EXTREMITY MMT:   MMT Right eval Left eval Left 04/22/2022 06/01/22 Left   Hip flexion 4+ 4  4+  Hip extension        Hip abduction        Hip adduction        Hip internal rotation  Hip external rotation        Knee flexion 5 4+ 5/5   Knee extension 5 3- 5/5   Ankle dorsiflexion        Ankle plantarflexion         Ankle inversion        Ankle eversion         (Blank rows = not tested)   LOWER EXTREMITY SPECIAL TESTS:  None    FUNCTIONAL TESTS:  5 x STS 14.65 seconds TUG 23.8 seconds   04/22/2022: TUG: 16 seconds 5xSTS: 12.5 seconds  05/13/2022: TUG: 15 seconds 6MWT: 350 ft, one rest break at 4 minutes to 5 minutes 06/15/22: 480 ft 6 MWT     GAIT: Distance walked: 10 ft  Assistive device utilized: Single point cane Level of assistance: Modified independence Comments: limited knee flexion during stance and swing; WBOS       TODAY'S TREATMENT: [facility] Adult PT Treatment:                                                DATE: 06/17/22 Therapeutic Exercise: Recumbent bike x 2 minutes rocking then backward revolutions NuStep level 6 x 5 minutes UE/LE  Resisted knee extension 2 x 10 @ 5 lbs  Resisted HS curl 2 x 10 @ 10 lbs  Standing resisted hip abduction 2 x 10; red band  Standing resisted hip extension 2 x 10; red band Sit to stand 2 x 10  Updated HEP  [facility] Adult PT Treatment:                                                DATE: 06/15/22 Therapeutic Exercise: NuStep level 6 x 5 minutes 6 MWT  Standing hip extension 2 x 10; 2.5 # Standing hip abduction 2 x 10; 2.5# Standing HS curl 2 x 10; 2.5 # Standing march 2 x 10; 2.5 # Updated HEP to include strengthening and walking program.    [facility] Adult PT Treatment:                                                DATE: 06/08/22 Therapeutic Exercise: NuStep level 6 x 5 minutes  Sit to stand from raised no UE support 2 x 10  TKE blue band 2 x 10  Standing hip extension 2 x 10; 1# Standing hip abduction 2 x 10; 1# Standing HS curl 2 x10; 1# Standing march 2 x 10; 1# LAQ 2 x 10; 3#    [place] Adult PT Treatment:                                                DATE: 06/03/22 Therapeutic Exercise: NuStep level 5 x 5 minutes UE/LE Seated resisted hip abduction green band 2 x 10  Seated resisted march green band 2 x 10   Standing calf  raise 2 x 15  Standing hip abduction 2 x 10  Standing hip extension 2  x 10  Standing march 2 x 10  Updated HEP      PATIENT EDUCATION:  Education details: HEP; Person educated: patient Education method: Systems developer, cues, handout Education comprehension: returned demo, cues     HOME EXERCISE PROGRAM: Access Code: [email or web address] URL: [email or web address] Date: 04/01/2022 Prepared by: Gwendolyn Grant   Exercises - Standing March with Counter Support  - 2 x daily - 7 x weekly - 2 sets - 10 reps - Supine Heel Slide  - 2 x daily - 7 x weekly - 1 sets - 10 reps - 5 sec  hold - Supine Quad Set  - 2 x daily - 7 x weekly - 2 sets - 10 reps - 5 sec  hold - Seated Knee Extension AAROM  - 2 x daily - 7 x weekly - 1 sets - 10 reps - 5 sec  hold  Added 04/22/2022: - Seated Long Arc Quad  - 1 x daily - 7 x weekly - 3 sets - 10 reps - 3 seconds hold - Supine Core Bicycle  - 1 x daily - 7 x weekly - 3 sets - 12 reps   ASSESSMENT:   CLINICAL IMPRESSION: Patient arrives without reports of knee pain. Patient is interested in using her exercise bike at home, so tried our recumbent bike today. She is able to complete backward revolutions and was encouraged to use her bike at home to assist with improving her ROM. Utilized machines for strengthening today with good tolerance. Continued with progression of standing strengthening, introducing resistance band with majority of exercises. No complaints of knee pain throughout session. HEP was updated to include further strengthening.      OBJECTIVE IMPAIRMENTS Abnormal gait, decreased activity tolerance, decreased balance, difficulty walking, decreased ROM, decreased strength, improper body mechanics, postural dysfunction, and pain.    ACTIVITY LIMITATIONS carrying, lifting, bending, standing, squatting, stairs, transfers, and locomotion level   PARTICIPATION LIMITATIONS: meal prep, cleaning, laundry, shopping, and community activity   PERSONAL  FACTORS Age, Fitness, Time since onset of injury/illness/exacerbation, and 3+ comorbidities: see PMH above  are also affecting patient's functional outcome.        GOALS: Goals reviewed with patient? Yes   SHORT TERM GOALS: Target date: 04/23/22 Patient will be independent and compliant with initial HEP.    Baseline: issued at eval  04/15/2022: Pt reports adherence to her HEP Goal status: ACHIEVED   2.  Patient will demonstrate at least 120 degrees of Lt knee flexion AROM to improve gait mechanics.  Baseline: see above  Status: see chart above  Goal status: ongoing    3.  Patient will complete TUG in </= 18 seconds to signify a reduction in fall risk.  Baseline: see above  04/21/2022: 16 seconds 05/13/2022: 15 seconds Goal status: ACHIEVED     LONG TERM GOALS: Target date: 05/14/22   Patient will demonstrate at least 4/5 Lt knee extension strength to improve ability to complete stair and curb negotiation.  Baseline: see above  04/22/2022: 5/5 Goal status: ACHIEVED   2.  Patient will report pain at worst rated as </=5/10 to reduce her current functional limitations.  Baseline: 8/10  04/22/2022: 5/10 worst pain Goal status: ACHIEVED   3.  Patient will be able to participate in community activity independently.  Baseline: unable to attend community events independently at this time due to pain.  04/22/2022: Pt reports ability to partake in community activity such as going to plays, but not grocery shopping Goal status: IN PROGRESS  4.  Patient will score at least 52% on FOTO to signify clinically meaningful improvement in functional abilities.    Baseline: 37 04/22/2022: 36% 05/04/2022: 33% Goal status: IN PROGRESS  5.  *New 05/13/2022* Patient will ambulate 550 feet in  a 6-minute walk test in order to demonstrate safety with functional community ambulation.    Baseline (05/13/2022): 350 ft, one rest break at 4 minutes to 5 minutes Goal status: ongoing      PLAN: PT FREQUENCY: 2x/week   PT DURATION: 6 weeks   PLANNED INTERVENTIONS: Therapeutic exercises, Therapeutic activity, Neuromuscular re-education, Balance training, Gait training, Patient/Family education, Self Care, Joint mobilization, Stair training, Aquatic Therapy, Dry Needling, Cryotherapy, Moist heat, Vasopneumatic device, Manual therapy, and Re-evaluation   PLAN FOR NEXT SESSION: review and progress HEP prn; manual to knee; focus on restoring flexion, hip flexor stretching, quad strengthening.   Gwendolyn Grant, PT, DPT, ATC 06/17/22 2:42 PM

## 2022-06-21 ENCOUNTER — Ambulatory Visit
Admission: RE | Admit: 2022-06-21 | Discharge: 2022-06-21 | Disposition: A | Discharge status: Home / Self Care | Payer: Medicare HMO | Source: Ambulatory Visit | Attending: Endocrinology | Admitting: Endocrinology

## 2022-06-21 DIAGNOSIS — M810 Age-related osteoporosis without current pathological fracture: Secondary | ICD-10-CM

## 2022-06-21 NOTE — Therapy (Signed)
OUTPATIENT PHYSICAL THERAPY TREATMENT NOTE   Patient Name: [name] MRN: [id number] DOB:17-Aug-1940, 81 y.o., female Today's Date: 06/22/2022  PCP: [name] REFERRING PROVIDER: Frederik Pear, MD  END OF SESSION:   PT End of Session - 06/22/22 1232     Visit Number 19    Number of Visits 25    Date for PT Re-Evaluation 07/01/22    Authorization Type [employer] MCR    Authorization Time Period FOTO V6, V10, kx mod v15    PT Start Time 1231    PT Stop Time 1312    PT Time Calculation (min) 41 min    Equipment Utilized During Treatment --    Activity Tolerance Patient tolerated treatment well    Behavior During Therapy WFL for tasks assessed/performed                              Past Medical History:  Diagnosis Date   Anemia    PMH;  Patient denies this dx as of 06/03/21   Anxiety    Asthma    Breast cancer ([place]) 1988   left breast cancer   Collagenous colitis    Compression fracture    lumbar 1   Dyspnea    with exertion   Hypertension    Hypothyroidism    Osteoarthritis    Osteopenia    Pneumonia 07/2020   PONV (postoperative nausea and vomiting)    Sleep apnea    wears CPAP nightly   Past Surgical History:  Procedure Laterality Date   BREAST REDUCTION SURGERY     Right   CATARACT EXTRACTION W/ INTRAOCULAR LENS  IMPLANT, BILATERAL     CESAREAN SECTION     COLONOSCOPY W/ BIOPSIES AND POLYPECTOMY     CYST EXCISION N/A 08/30/2018   Procedure: excision of posterior shoulder / back sebaceous cyst;  Surgeon: Wallace Going, DO;  Location: [facility];  Service: Plastics;  Laterality: N/A;   KNEE ARTHROSCOPY Left 02/23/2022   Procedure: LEFT KNEE ARTHROSCOPY;  Surgeon: Melrose Nakayama, MD;  Location: [facility];  Service: Orthopedics;  Laterality: Left;   KYPHOPLASTY N/A 02/18/2016   Procedure: LUMBAR 1 KYPHOPLASTY;  Surgeon: Phylliss Bob, MD;  Location: [facility];  Service: Orthopedics;  Laterality: N/A;  LUMBAR 1  KYPHOPLASTY   KYPHOPLASTY N/A 06/04/2021   Procedure: LUMBAR 2 KYPHOPLASTY;  Surgeon: Phylliss Bob, MD;  Location: [facility];  Service: Orthopedics;  Laterality: N/A;   LAPAROSCOPIC APPENDECTOMY N/A 12/09/2021   Procedure: APPENDECTOMY LAPAROSCOPIC;  Surgeon: Ileana Roup, MD;  Location: [facility];  Service: General;  Laterality: N/A;   LUMBAR DISC SURGERY     MODIFIED RADICAL MASTECTOMY W/ AXILLARY LYMPH NODE DISSECTION     Left   RECONSTRUCTION BREAST W/ LATISSIMUS DORSI FLAP     left   REDUCTION MAMMAPLASTY Right    right foot surgery     corrected hammer toe, straightened big toe   SEPTOPLASTY     TOE SURGERY     TONSILLECTOMY AND ADENOIDECTOMY     Patient Active Problem List   Diagnosis Date Noted   Acute appendicitis 12/09/2021   S/P laparoscopic appendectomy 12/09/2021   CAP (community acquired pneumonia) 09/30/2020   Chronic respiratory failure with hypoxia ([place]) 09/23/2020   Allergic rhinitis 08/31/2019   Pilar cyst 08/04/2018   History of breast cancer in female 06/09/2018   History of reconstruction of both breasts 06/09/2018   Severe  obesity (BMI >= 40) ([place]) 09/16/2015   Asthma with acute exacerbation 09/15/2015   DOE (dyspnea on exertion) 01/11/2014   Insomnia 05/05/2012   Osteoarthritis    Osteopenia    Collagenous colitis    OSA (obstructive sleep apnea)    Hypothyroidism    Mild persistent asthma    Hypertension    Breast cancer, left breast ([facility])     REFERRING DIAG: s/p left knee scope  THERAPY DIAG:  Chronic pain of left knee  Muscle weakness (generalized)  Other abnormalities of gait and mobility  Rationale for Evaluation and Treatment Rehabilitation  PERTINENT HISTORY: Lt knee partial lateral meniscectomy on 02/23/22, breast cx, anxiety, asthma, compression fx, osteopenia  PRECAUTIONS: Falls  SUBJECTIVE: "The weather is telling me my knee is hurting."  PAIN:  Are you having pain? Yes: NPRS scale: 3/10 Pain location: Lt knee Pain  description: ache Aggravating factors: weather Relieving factors: movement    OBJECTIVE: (objective measures completed at initial evaluation unless otherwise dated)   DIAGNOSTIC FINDINGS:  Lt knee MRI: IMPRESSION: 1. Advanced tricompartmental osteoarthritis, most severe within the lateral compartment. 2. Advanced degeneration and tearing throughout the lateral meniscus. 3. Small joint effusion with multiple intra-articular loose bodies.   PATIENT SURVEYS:  FOTO 37% function to 52% predicted  04/22/2022: 36%  05/04/2022: 33%   COGNITION:           Overall cognitive status: Within functional limits for tasks assessed                          SENSATION: Not tested   EDEMA:  No obvious swelling about the Lt knee      POSTURE: rounded shoulders and forward head   PALPATION: TTP lateral joint line    LOWER EXTREMITY ROM:   Active ROM Right eval Left eval Right 04/09/2022 Left 04/09/2022 Left 04/15/2022 Left 04/22/2022 Left 04/29/2022 Left 05/13/2022 06/08/22 Left  06/22/22 Left  Hip flexion              Hip extension              Hip abduction              Hip adduction              Hip internal rotation              Hip external rotation              Knee flexion 120 109 113 95, 102 following  AAROM 101, 107 following AAROM 104, 109 following AAROM 113 AROM, 115 PROM 105 AROM, 116 PROM following AAROM 106 106  Knee extension Unable to accurately assess due to hip flexor tightness  Unable to accurately assess due to hip flexor tightness  -7 -20    -9 AROM    Ankle dorsiflexion              Ankle plantarflexion              Ankle inversion              Ankle eversion               (Blank rows = not tested)   LOWER EXTREMITY MMT:   MMT Right eval Left eval Left 04/22/2022 06/01/22 Left   Hip flexion 4+ 4  4+  Hip extension        Hip abduction  Hip adduction        Hip internal rotation        Hip external rotation        Knee flexion 5 4+ 5/5    Knee extension 5 3- 5/5   Ankle dorsiflexion        Ankle plantarflexion        Ankle inversion        Ankle eversion         (Blank rows = not tested)   LOWER EXTREMITY SPECIAL TESTS:  None    FUNCTIONAL TESTS:  5 x STS 14.65 seconds TUG 23.8 seconds   04/22/2022: TUG: 16 seconds 5xSTS: 12.5 seconds  05/13/2022: TUG: 15 seconds 6MWT: 350 ft, one rest break at 4 minutes to 5 minutes 06/15/22: 480 ft 6 MWT     GAIT: Distance walked: 10 ft  Assistive device utilized: Single point cane Level of assistance: Modified independence Comments: limited knee flexion during stance and swing; WBOS       TODAY'S TREATMENT: [facility] Adult PT Treatment:                                                DATE: 06/22/22 Therapeutic Exercise: Nustep level 6 x 5 minutes UE/LE  SLR 2 x 10  Lateral step ups on airex 2 x 10 each  Forward step ups on airex 2 x 10 each  Reviewed HEP   [facility] Adult PT Treatment:                                                DATE: 06/17/22 Therapeutic Exercise: Recumbent bike x 2 minutes rocking then backward revolutions NuStep level 6 x 5 minutes UE/LE  Resisted knee extension 2 x 10 @ 5 lbs  Resisted HS curl 2 x 10 @ 10 lbs  Standing resisted hip abduction 2 x 10; red band  Standing resisted hip extension 2 x 10; red band Sit to stand 2 x 10  Updated HEP  [facility] Adult PT Treatment:                                                DATE: 06/15/22 Therapeutic Exercise: NuStep level 6 x 5 minutes 6 MWT  Standing hip extension 2 x 10; 2.5 # Standing hip abduction 2 x 10; 2.5# Standing HS curl 2 x 10; 2.5 # Standing march 2 x 10; 2.5 # Updated HEP to include strengthening and walking program.    [facility] Adult PT Treatment:                                                DATE: 06/08/22 Therapeutic Exercise: NuStep level 6 x 5 minutes  Sit to stand from raised no UE support 2 x 10  TKE blue band 2 x 10  Standing hip extension 2 x 10; 1# Standing hip abduction 2 x  10; 1# Standing HS curl 2 x10; 1# Standing march 2  x 10; 1# LAQ 2 x 10; 3#      PATIENT EDUCATION:  Education details: HEP; Person educated: patient Education method: demo, instruction Education comprehension: verbalized understanding      HOME EXERCISE PROGRAM: Access Code: [email or web address] URL: [email or web address] Date: 04/01/2022 Prepared by: Gwendolyn Grant   Exercises - Standing March with Counter Support  - 2 x daily - 7 x weekly - 2 sets - 10 reps - Supine Heel Slide  - 2 x daily - 7 x weekly - 1 sets - 10 reps - 5 sec  hold - Supine Quad Set  - 2 x daily - 7 x weekly - 2 sets - 10 reps - 5 sec  hold - Seated Knee Extension AAROM  - 2 x daily - 7 x weekly - 1 sets - 10 reps - 5 sec  hold  Added 04/22/2022: - Seated Long Arc Quad  - 1 x daily - 7 x weekly - 3 sets - 10 reps - 3 seconds hold - Supine Core Bicycle  - 1 x daily - 7 x weekly - 3 sets - 12 reps   ASSESSMENT:   CLINICAL IMPRESSION: Patient arrives with mild knee pain attributed to the weather. Focused on progression of standing strengthening, incorporating step ups. She has tendency to circumduct when stepping up/down with the LLE. With cues and continued practice she is able to allow for knee/hip flexion to clear the step. She reported a reduction in knee pain at conclusion of session.      OBJECTIVE IMPAIRMENTS Abnormal gait, decreased activity tolerance, decreased balance, difficulty walking, decreased ROM, decreased strength, improper body mechanics, postural dysfunction, and pain.    ACTIVITY LIMITATIONS carrying, lifting, bending, standing, squatting, stairs, transfers, and locomotion level   PARTICIPATION LIMITATIONS: meal prep, cleaning, laundry, shopping, and community activity   PERSONAL FACTORS Age, Fitness, Time since onset of injury/illness/exacerbation, and 3+ comorbidities: see PMH above  are also affecting patient's functional outcome.        GOALS: Goals reviewed with patient?  Yes   SHORT TERM GOALS: Target date: 04/23/22 Patient will be independent and compliant with initial HEP.    Baseline: issued at eval  04/15/2022: Pt reports adherence to her HEP Goal status: ACHIEVED   2.  Patient will demonstrate at least 120 degrees of Lt knee flexion AROM to improve gait mechanics.  Baseline: see above  Status: see chart above  Goal status: ongoing    3.  Patient will complete TUG in </= 18 seconds to signify a reduction in fall risk.  Baseline: see above  04/21/2022: 16 seconds 05/13/2022: 15 seconds Goal status: ACHIEVED     LONG TERM GOALS: Target date: 05/14/22   Patient will demonstrate at least 4/5 Lt knee extension strength to improve ability to complete stair and curb negotiation.  Baseline: see above  04/22/2022: 5/5 Goal status: ACHIEVED   2.  Patient will report pain at worst rated as </=5/10 to reduce her current functional limitations.  Baseline: 8/10  04/22/2022: 5/10 worst pain Goal status: ACHIEVED   3.  Patient will be able to participate in community activity independently.  Baseline: unable to attend community events independently at this time due to pain.  04/22/2022: Pt reports ability to partake in community activity such as going to plays, but not grocery shopping Goal status: IN PROGRESS   4.  Patient will score at least 52% on FOTO to signify clinically meaningful improvement in functional abilities.    Baseline: 37 04/22/2022: 36%  05/04/2022: 33% Goal status: IN PROGRESS  5.  *New 05/13/2022* Patient will ambulate 550 feet in  a 6-minute walk test in order to demonstrate safety with functional community ambulation.    Baseline (05/13/2022): 350 ft, one rest break at 4 minutes to 5 minutes Goal status: ongoing     PLAN: PT FREQUENCY: 2x/week   PT DURATION: 6 weeks   PLANNED INTERVENTIONS: Therapeutic exercises, Therapeutic activity, Neuromuscular re-education, Balance training, Gait training, Patient/Family education,  Self Care, Joint mobilization, Stair training, Aquatic Therapy, Dry Needling, Cryotherapy, Moist heat, Vasopneumatic device, Manual therapy, and Re-evaluation   PLAN FOR NEXT SESSION: review and progress HEP prn; manual to knee; focus on restoring flexion, hip flexor stretching, quad strengthening.   Gwendolyn Grant, PT, DPT, ATC 06/22/22 1:17 PM

## 2022-06-22 ENCOUNTER — Ambulatory Visit: Payer: Medicare HMO

## 2022-06-22 DIAGNOSIS — M6281 Muscle weakness (generalized): Secondary | ICD-10-CM

## 2022-06-22 DIAGNOSIS — M25562 Pain in left knee: Secondary | ICD-10-CM | POA: Diagnosis not present

## 2022-06-22 DIAGNOSIS — R2689 Other abnormalities of gait and mobility: Secondary | ICD-10-CM

## 2022-06-22 DIAGNOSIS — G8929 Other chronic pain: Secondary | ICD-10-CM

## 2022-06-23 NOTE — Therapy (Signed)
OUTPATIENT PHYSICAL THERAPY TREATMENT NOTE  Progress Note Reporting Period 05/13/22 to 06/23/22  See note below for Objective Data and Assessment of Progress/Goals.     Patient Name: [name] MRN: [id number] DOB:[birth year], 81 y.o., female Today's Date: 06/24/2022  PCP: [name] REFERRING PROVIDER: Frederik Pear, MD  END OF SESSION:   PT End of Session - 06/24/22 1233     Visit Number 20    Number of Visits 25    Date for PT Re-Evaluation 07/01/22    Authorization Type Aet[employer] MCR    Authorization Time Period FOTO V6, V10, kx mod v15    PT Start Time 1232    PT Stop Time 1313    PT Time Calculation (min) 41 min    Equipment Utilized During Treatment Gait belt    Activity Tolerance Patient tolerated treatment well    Behavior During Therapy WFL for tasks assessed/performed                               Past Medical History:  Diagnosis Date   Anemia    PMH;  Patient denies this dx as of 06/03/21   Anxiety    Asthma    Breast cancer ([place]) 1988   left breast cancer   Collagenous colitis    Compression fracture    lumbar 1   Dyspnea    with exertion   Hypertension    Hypothyroidism    Osteoarthritis    Osteopenia    Pneumonia 07/2020   PONV (postoperative nausea and vomiting)    Sleep apnea    wears CPAP nightly   Past Surgical History:  Procedure Laterality Date   BREAST REDUCTION SURGERY     Right   CATARACT EXTRACTION W/ INTRAOCULAR LENS  IMPLANT, BILATERAL     CESAREAN SECTION     COLONOSCOPY W/ BIOPSIES AND POLYPECTOMY     CYST EXCISION N/A 08/30/2018   Procedure: excision of posterior shoulder / back sebaceous cyst;  Surgeon: Wallace Going, DO;  Location: [facility];  Service: Plastics;  Laterality: N/A;   KNEE ARTHROSCOPY Left 02/23/2022   Procedure: LEFT KNEE ARTHROSCOPY;  Surgeon: Melrose Nakayama, MD;  Location[facility];  Service: Orthopedics;  Laterality: Left;   KYPHOPLASTY N/A  02/18/2016   Procedure: LUMBAR 1 KYPHOPLASTY;  Surgeon: Phylliss Bob, MD;  Location: [facility];  Service: Orthopedics;  Laterality: N/A;  LUMBAR 1 KYPHOPLASTY   KYPHOPLASTY N/A 06/04/2021   Procedure: LUMBAR 2 KYPHOPLASTY;  Surgeon: Phylliss Bob, MD;  Location: [facility];  Service: Orthopedics;  Laterality: N/A;   LAPAROSCOPIC APPENDECTOMY N/A 12/09/2021   Procedure: APPENDECTOMY LAPAROSCOPIC;  Surgeon: Ileana Roup, MD;  Location: [facility];  Service: General;  Laterality: N/A;   LUMBAR DISC SURGERY     MODIFIED RADICAL MASTECTOMY W/ AXILLARY LYMPH NODE DISSECTION     Left   RECONSTRUCTION BREAST W/ LATISSIMUS DORSI FLAP     left   REDUCTION MAMMAPLASTY Right    right foot surgery     corrected hammer toe, straightened big toe   SEPTOPLASTY     TOE SURGERY     TONSILLECTOMY AND ADENOIDECTOMY     Patient Active Problem List   Diagnosis Date Noted   Acute appendicitis 12/09/2021   S/P laparoscopic appendectomy 12/09/2021   CAP (community acquired pneumonia) 09/30/2020   Chronic respiratory failure with hypoxia ([place]) 09/23/2020   Allergic rhinitis 08/31/2019   Pilar  cyst 08/04/2018   History of breast cancer in female 06/09/2018   History of reconstruction of both breasts 06/09/2018   Severe obesity (BMI >= 40) ([place]) 09/16/2015   Asthma with acute exacerbation 09/15/2015   DOE (dyspnea on exertion) 01/11/2014   Insomnia 05/05/2012   Osteoarthritis    Osteopenia    Collagenous colitis    OSA (obstructive sleep apnea)    Hypothyroidism    Mild persistent asthma    Hypertension    Breast cancer, left breast ([place])     REFERRING DIAG: s/p left knee scope  THERAPY DIAG:  Chronic pain of left knee  Muscle weakness (generalized)  Other abnormalities of gait and mobility  Rationale for Evaluation and Treatment Rehabilitation  PERTINENT HISTORY: Lt knee partial lateral meniscectomy on 02/23/22, breast cx, anxiety, asthma, compression fx, osteopenia  PRECAUTIONS:  Falls  SUBJECTIVE: Patient reports the knee is feeling ok today.  PAIN:  Are you having pain? Yes: NPRS scale: 1.5/10 Pain location: Lt knee Pain description: ache Aggravating factors: weather Relieving factors: movement    OBJECTIVE: (objective measures completed at initial evaluation unless otherwise dated)   DIAGNOSTIC FINDINGS:  Lt knee MRI: IMPRESSION: 1. Advanced tricompartmental osteoarthritis, most severe within the lateral compartment. 2. Advanced degeneration and tearing throughout the lateral meniscus. 3. Small joint effusion with multiple intra-articular loose bodies.   PATIENT SURVEYS:  FOTO 37% function to 52% predicted  04/22/2022: 36%  05/04/2022: 33%  06/24/22: 43% function    COGNITION:           Overall cognitive status: Within functional limits for tasks assessed                          SENSATION: Not tested   EDEMA:  No obvious swelling about the Lt knee      POSTURE: rounded shoulders and forward head   PALPATION: TTP lateral joint line    LOWER EXTREMITY ROM:   Active ROM Right eval Left eval Right 04/09/2022 Left 04/09/2022 Left 04/15/2022 Left 04/22/2022 Left 04/29/2022 Left 05/13/2022 06/08/22 Left  06/22/22 Left 06/24/22 Left   Hip flexion               Hip extension               Hip abduction               Hip adduction               Hip internal rotation               Hip external rotation               Knee flexion 120 109 113 95, 102 following  AAROM 101, 107 following AAROM 104, 109 following AAROM 113 AROM, 115 PROM 105 AROM, 116 PROM following AAROM 106 106 110  Knee extension Unable to accurately assess due to hip flexor tightness  Unable to accurately assess due to hip flexor tightness  -7 -20    -9 AROM     Ankle dorsiflexion               Ankle plantarflexion               Ankle inversion               Ankle eversion                (Blank rows = not tested)  LOWER EXTREMITY MMT:   MMT Right eval  Left eval Left 04/22/2022 06/01/22 Left   Hip flexion 4+ 4  4+  Hip extension        Hip abduction        Hip adduction        Hip internal rotation        Hip external rotation        Knee flexion 5 4+ 5/5   Knee extension 5 3- 5/5   Ankle dorsiflexion        Ankle plantarflexion        Ankle inversion        Ankle eversion         (Blank rows = not tested)   LOWER EXTREMITY SPECIAL TESTS:  None    FUNCTIONAL TESTS:  5 x STS 14.65 seconds TUG 23.8 seconds   04/22/2022: TUG: 16 seconds 5xSTS: 12.5 seconds  05/13/2022: TUG: 15 seconds 6MWT: 350 ft, one rest break at 4 minutes to 5 minutes 06/15/22: 480 ft 6 MWT  06/24/22: 498 ft 6 MWT     GAIT: Distance walked: 10 ft  Assistive device utilized: Single point cane Level of assistance: Modified independence Comments: limited knee flexion during stance and swing; WBOS       TODAY'S TREATMENT: [facility] Adult PT Treatment:                                                DATE: 06/24/22 Therapeutic Exercise: Nustep level 6 x 5 minutes UE/LE Sit to stand 2 x 10; 5 lb kettlebell  Standing hip abduction 2 x 15 @ 3 lbs  Standing hip extension 2 x 15 @ 3 lbs   Therapeutic Activity: Re-assessment to determine overall progress, educating patient on progress towards goals.    [facility] Adult PT Treatment:                                                DATE: 06/22/22 Therapeutic Exercise: Nustep level 6 x 5 minutes UE/LE  SLR 2 x 10  Lateral step ups on airex 2 x 10 each  Forward step ups on airex 2 x 10 each  Reviewed HEP   [facility] Adult PT Treatment:                                                DATE: 06/17/22 Therapeutic Exercise: Recumbent bike x 2 minutes rocking then backward revolutions NuStep level 6 x 5 minutes UE/LE  Resisted knee extension 2 x 10 @ 5 lbs  Resisted HS curl 2 x 10 @ 10 lbs  Standing resisted hip abduction 2 x 10; red band  Standing resisted hip extension 2 x 10; red band Sit to stand 2 x 10  Updated  HEP         PATIENT EDUCATION:  Education details: see treatment  Person educated: patient Education method: demo, instruction Education comprehension: verbalized understanding      HOME EXERCISE PROGRAM: Access Code: [email or web address] URL: [email or web address] Date: 04/01/2022 Prepared by: Gwendolyn Grant   Exercises - Standing March with Counter  Support  - 2 x daily - 7 x weekly - 2 sets - 10 reps - Supine Heel Slide  - 2 x daily - 7 x weekly - 1 sets - 10 reps - 5 sec  hold - Supine Quad Set  - 2 x daily - 7 x weekly - 2 sets - 10 reps - 5 sec  hold - Seated Knee Extension AAROM  - 2 x daily - 7 x weekly - 1 sets - 10 reps - 5 sec  hold  Added 04/22/2022: - Seated Long Arc Quad  - 1 x daily - 7 x weekly - 3 sets - 10 reps - 3 seconds hold - Supine Core Bicycle  - 1 x daily - 7 x weekly - 3 sets - 12 reps   ASSESSMENT:   CLINICAL IMPRESSION: Patient is making steady functional progress since the start of care. She demonstrates improvements in 6 MWT, Lt knee ROM, and strength. She has met 2/3 short term functional goals and 3/5 long term functional goals. She will benefit from continuing with current POC to further progress her standing strength/endurance in order to optimize her function.      OBJECTIVE IMPAIRMENTS Abnormal gait, decreased activity tolerance, decreased balance, difficulty walking, decreased ROM, decreased strength, improper body mechanics, postural dysfunction, and pain.    ACTIVITY LIMITATIONS carrying, lifting, bending, standing, squatting, stairs, transfers, and locomotion level   PARTICIPATION LIMITATIONS: meal prep, cleaning, laundry, shopping, and community activity   PERSONAL FACTORS Age, Fitness, Time since onset of injury/illness/exacerbation, and 3+ comorbidities: see PMH above  are also affecting patient's functional outcome.        GOALS: Goals reviewed with patient? Yes   SHORT TERM GOALS: Target date: 04/23/22 Patient will be  independent and compliant with initial HEP.    Baseline: issued at eval  04/15/2022: Pt reports adherence to her HEP Goal status: ACHIEVED   2.  Patient will demonstrate at least 120 degrees of Lt knee flexion AROM to improve gait mechanics.  Baseline: see above  Status: see chart above  Goal status: ongoing    3.  Patient will complete TUG in </= 18 seconds to signify a reduction in fall risk.  Baseline: see above  04/21/2022: 16 seconds 05/13/2022: 15 seconds Goal status: ACHIEVED     LONG TERM GOALS: Target date: 05/14/22   Patient will demonstrate at least 4/5 Lt knee extension strength to improve ability to complete stair and curb negotiation.  Baseline: see above  04/22/2022: 5/5 Goal status: ACHIEVED   2.  Patient will report pain at worst rated as </=5/10 to reduce her current functional limitations.  Baseline: 8/10  04/22/2022: 5/10 worst pain Goal status: ACHIEVED   3.  Patient will be able to participate in community activity independently.  Baseline: unable to attend community events independently at this time due to pain.  04/22/2022: Pt reports ability to partake in community activity such as going to plays, but not grocery shopping 06/24/22: Patient reports she is using her RW to participate with all community activity independently.  Goal status: achieved    4.  Patient will score at least 52% on FOTO to signify clinically meaningful improvement in functional abilities.    Baseline: see above Goal status: ongoing  5.  *New 05/13/2022* Patient will ambulate 550 feet in  a 6-minute walk test in order to demonstrate safety with functional community ambulation.    Baseline: see above  Goal status: ongoing     PLAN: PT  FREQUENCY: 2x/week   PT DURATION: 6 weeks   PLANNED INTERVENTIONS: Therapeutic exercises, Therapeutic activity, Neuromuscular re-education, Balance training, Gait training, Patient/Family education, Self Care, Joint mobilization, Stair  training, Aquatic Therapy, Dry Needling, Cryotherapy, Moist heat, Vasopneumatic device, Manual therapy, and Re-evaluation   PLAN FOR NEXT SESSION: review and progress HEP prn; standing strengthening    Gwendolyn Grant, PT, DPT, ATC 06/24/22 1:41 PM

## 2022-06-24 ENCOUNTER — Ambulatory Visit: Payer: Medicare HMO

## 2022-06-24 DIAGNOSIS — G8929 Other chronic pain: Secondary | ICD-10-CM

## 2022-06-24 DIAGNOSIS — M25562 Pain in left knee: Secondary | ICD-10-CM | POA: Diagnosis not present

## 2022-06-24 DIAGNOSIS — M6281 Muscle weakness (generalized): Secondary | ICD-10-CM

## 2022-06-24 DIAGNOSIS — R2689 Other abnormalities of gait and mobility: Secondary | ICD-10-CM

## 2022-06-29 ENCOUNTER — Ambulatory Visit: Payer: Medicare HMO

## 2022-06-29 DIAGNOSIS — M25562 Pain in left knee: Secondary | ICD-10-CM | POA: Diagnosis not present

## 2022-06-29 DIAGNOSIS — R2689 Other abnormalities of gait and mobility: Secondary | ICD-10-CM

## 2022-06-29 DIAGNOSIS — M6281 Muscle weakness (generalized): Secondary | ICD-10-CM

## 2022-06-29 DIAGNOSIS — G8929 Other chronic pain: Secondary | ICD-10-CM

## 2022-06-29 NOTE — Therapy (Signed)
OUTPATIENT PHYSICAL THERAPY TREATMENT NOTE  Progress Note Reporting Period 05/13/22 to 06/23/22  See note below for Objective Data and Assessment of Progress/Goals.     Patient Name: [name] MRN: [id number] DOB:[birth year], 81 y.o., female Today's Date: 06/29/2022  PCP: [name] REFERRING PROVIDER: Frederik Pear, MD  END OF SESSION:   PT End of Session - 06/29/22 1242     Visit Number 21    Number of Visits 25    Date for PT Re-Evaluation 07/01/22    Authorization Type [employer] MCR    Authorization Time Period FOTO V6, V10, kx mod v15    Progress Note Due on Visit 10    PT Start Time 1245    PT Stop Time 1330    PT Time Calculation (min) 45 min    Equipment Utilized During Treatment Gait belt    Activity Tolerance Patient tolerated treatment well    Behavior During Therapy WFL for tasks assessed/performed                                Past Medical History:  Diagnosis Date   Anemia    PMH;  Patient denies this dx as of 06/03/21   Anxiety    Asthma    Breast cancer ([place]) 1988   left breast cancer   Collagenous colitis    Compression fracture    lumbar 1   Dyspnea    with exertion   Hypertension    Hypothyroidism    Osteoarthritis    Osteopenia    Pneumonia 07/2020   PONV (postoperative nausea and vomiting)    Sleep apnea    wears CPAP nightly   Past Surgical History:  Procedure Laterality Date   BREAST REDUCTION SURGERY     Right   CATARACT EXTRACTION W/ INTRAOCULAR LENS  IMPLANT, BILATERAL     CESAREAN SECTION     COLONOSCOPY W/ BIOPSIES AND POLYPECTOMY     CYST EXCISION N/A 08/30/2018   Procedure: excision of posterior shoulder / back sebaceous cyst;  Surgeon: Wallace Going, DO;  Location: [place];  Service: Plastics;  Laterality: N/A;   KNEE ARTHROSCOPY Left 02/23/2022   Procedure: LEFT KNEE ARTHROSCOPY;  Surgeon: Melrose Nakayama, MD;  Location: [facility];  Service: Orthopedics;   Laterality: Left;   KYPHOPLASTY N/A 02/18/2016   Procedure: LUMBAR 1 KYPHOPLASTY;  Surgeon: Phylliss Bob, MD;  Location: [place];  Service: Orthopedics;  Laterality: N/A;  LUMBAR 1 KYPHOPLASTY   KYPHOPLASTY N/A 06/04/2021   Procedure: LUMBAR 2 KYPHOPLASTY;  Surgeon: Phylliss Bob, MD;  Location: [place];  Service: Orthopedics;  Laterality: N/A;   LAPAROSCOPIC APPENDECTOMY N/A 12/09/2021   Procedure: APPENDECTOMY LAPAROSCOPIC;  Surgeon: Ileana Roup, MD;  Location: [place];  Service: General;  Laterality: N/A;   LUMBAR DISC SURGERY     MODIFIED RADICAL MASTECTOMY W/ AXILLARY LYMPH NODE DISSECTION     Left   RECONSTRUCTION BREAST W/ LATISSIMUS DORSI FLAP     left   REDUCTION MAMMAPLASTY Right    right foot surgery     corrected hammer toe, straightened big toe   SEPTOPLASTY     TOE SURGERY     TONSILLECTOMY AND ADENOIDECTOMY     Patient Active Problem List   Diagnosis Date Noted   Acute appendicitis 12/09/2021   S/P laparoscopic appendectomy 12/09/2021   CAP (community acquired pneumonia) 09/30/2020   Chronic respiratory failure with hypoxia (  [place]) 09/23/2020   Allergic rhinitis 08/31/2019   Pilar cyst 08/04/2018   History of breast cancer in female 06/09/2018   History of reconstruction of both breasts 06/09/2018   Severe obesity (BMI >= 40) ([place]) 09/16/2015   Asthma with acute exacerbation 09/15/2015   DOE (dyspnea on exertion) 01/11/2014   Insomnia 05/05/2012   Osteoarthritis    Osteopenia    Collagenous colitis    OSA (obstructive sleep apnea)    Hypothyroidism    Mild persistent asthma    Hypertension    Breast cancer, left breast ([place])     REFERRING DIAG: s/p left knee scope  THERAPY DIAG:  Chronic pain of left knee  Muscle weakness (generalized)  Other abnormalities of gait and mobility  Rationale for Evaluation and Treatment Rehabilitation  PERTINENT HISTORY: Lt knee partial lateral meniscectomy on 02/23/22, breast cx, anxiety, asthma, compression fx,  osteopenia  PRECAUTIONS: Falls  SUBJECTIVE: Pt reports her knee is doing much better, although she is still having back problems.   PAIN:  Are you having pain? Yes: NPRS scale: 1.5/10 Pain location: Lt knee Pain description: ache Aggravating factors: weather Relieving factors: movement    OBJECTIVE: (objective measures completed at initial evaluation unless otherwise dated)   DIAGNOSTIC FINDINGS:  Lt knee MRI: IMPRESSION: 1. Advanced tricompartmental osteoarthritis, most severe within the lateral compartment. 2. Advanced degeneration and tearing throughout the lateral meniscus. 3. Small joint effusion with multiple intra-articular loose bodies.   PATIENT SURVEYS:  FOTO 37% function to 52% predicted  04/22/2022: 36%  05/04/2022: 33%  06/24/22: 43% function    COGNITION:           Overall cognitive status: Within functional limits for tasks assessed                          SENSATION: Not tested   EDEMA:  No obvious swelling about the Lt knee      POSTURE: rounded shoulders and forward head   PALPATION: TTP lateral joint line    LOWER EXTREMITY ROM:   Active ROM Right eval Left eval Right 04/09/2022 Left 04/09/2022 Left 04/15/2022 Left 04/22/2022 Left 04/29/2022 Left 05/13/2022 06/08/22 Left  06/22/22 Left 06/24/22 Left   Hip flexion               Hip extension               Hip abduction               Hip adduction               Hip internal rotation               Hip external rotation               Knee flexion 120 109 113 95, 102 following  AAROM 101, 107 following AAROM 104, 109 following AAROM 113 AROM, 115 PROM 105 AROM, 116 PROM following AAROM 106 106 110  Knee extension Unable to accurately assess due to hip flexor tightness  Unable to accurately assess due to hip flexor tightness  -7 -20    -9 AROM     Ankle dorsiflexion               Ankle plantarflexion               Ankle inversion               Ankle eversion                (  Blank  rows = not tested)   LOWER EXTREMITY MMT:   MMT Right eval Left eval Left 04/22/2022 06/01/22 Left   Hip flexion 4+ 4  4+  Hip extension        Hip abduction        Hip adduction        Hip internal rotation        Hip external rotation        Knee flexion 5 4+ 5/5   Knee extension 5 3- 5/5   Ankle dorsiflexion        Ankle plantarflexion        Ankle inversion        Ankle eversion         (Blank rows = not tested)   LOWER EXTREMITY SPECIAL TESTS:  None    FUNCTIONAL TESTS:  5 x STS 14.65 seconds TUG 23.8 seconds   04/22/2022: TUG: 16 seconds 5xSTS: 12.5 seconds  05/13/2022: TUG: 15 seconds 6MWT: 350 ft, one rest break at 4 minutes to 5 minutes 06/15/22: 480 ft 6 MWT  06/24/22: 498 ft 6 MWT     GAIT: Distance walked: 10 ft  Assistive device utilized: Single point cane Level of assistance: Modified independence Comments: limited knee flexion during stance and swing; WBOS       TODAY'S TREATMENT:  [facility] Adult PT Treatment:                                                DATE: 06/29/2022 Therapeutic Exercise: Treadmill at 0.61mh x4 minutes while collecting subjective information Deadlift in front of table with10# kettlebell 3x10 Side steps with controlled high knees x4 laps of length of table  Seated hamstring curl with 7# cable 2x10 BIL Seated Lt active hamstring stretch x261m Manual Therapy: N/A Neuromuscular re-ed: N/A Therapeutic Activity: N/A Modalities: N/A Self Care: N/A   [facility] Adult PT Treatment:                                                DATE: 06/24/22 Therapeutic Exercise: Nustep level 6 x 5 minutes UE/LE Sit to stand 2 x 10; 5 lb kettlebell  Standing hip abduction 2 x 15 @ 3 lbs  Standing hip extension 2 x 15 @ 3 lbs   Therapeutic Activity: Re-assessment to determine overall progress, educating patient on progress towards goals.    [facility]:                                                DATE:  06/22/22 Therapeutic Exercise: Nustep level 6 x 5 minutes UE/LE  SLR 2 x 10  Lateral step ups on airex 2 x 10 each  Forward step ups on airex 2 x 10 each  Reviewed HEP   PATIENT EDUCATION:  Education details: see treatment  Person educated: patient Education method: demo, instruction Education comprehension: verbalized understanding      HOME EXERCISE PROGRAM: Access Code: [email or web address]: [email or web address] Date: 04/01/2022 Prepared by: SaGwendolyn Grant Exercises - Standing March with Counter Support  - 2 x  daily - 7 x weekly - 2 sets - 10 reps - Supine Heel Slide  - 2 x daily - 7 x weekly - 1 sets - 10 reps - 5 sec  hold - Supine Quad Set  - 2 x daily - 7 x weekly - 2 sets - 10 reps - 5 sec  hold - Seated Knee Extension AAROM  - 2 x daily - 7 x weekly - 1 sets - 10 reps - 5 sec  hold  Added 04/22/2022: - Seated Long Arc Quad  - 1 x daily - 7 x weekly - 3 sets - 10 reps - 3 seconds hold - Supine Core Bicycle  - 1 x daily - 7 x weekly - 3 sets - 12 reps   ASSESSMENT:   CLINICAL IMPRESSION: Pt responded excellently to all interventions today, demonstrating good form and no knee pain throughout the session. She was able to complete a 4-minute treadmill walk to start the session and was able to increase loading with strengthening exercises. She will continue to benefit from skilled PT to address her primary impairments and return to her prior level of function with less limitation.      OBJECTIVE IMPAIRMENTS Abnormal gait, decreased activity tolerance, decreased balance, difficulty walking, decreased ROM, decreased strength, improper body mechanics, postural dysfunction, and pain.    ACTIVITY LIMITATIONS carrying, lifting, bending, standing, squatting, stairs, transfers, and locomotion level   PARTICIPATION LIMITATIONS: meal prep, cleaning, laundry, shopping, and community activity   PERSONAL FACTORS Age, Fitness, Time since onset of injury/illness/exacerbation,  and 3+ comorbidities: see PMH above  are also affecting patient's functional outcome.        GOALS: Goals reviewed with patient? Yes   SHORT TERM GOALS: Target date: 04/23/22 Patient will be independent and compliant with initial HEP.    Baseline: issued at eval  04/15/2022: Pt reports adherence to her HEP Goal status: ACHIEVED   2.  Patient will demonstrate at least 120 degrees of Lt knee flexion AROM to improve gait mechanics.  Baseline: see above  Status: see chart above  Goal status: ongoing    3.  Patient will complete TUG in </= 18 seconds to signify a reduction in fall risk.  Baseline: see above  04/21/2022: 16 seconds 05/13/2022: 15 seconds Goal status: ACHIEVED     LONG TERM GOALS: Target date: 05/14/22   Patient will demonstrate at least 4/5 Lt knee extension strength to improve ability to complete stair and curb negotiation.  Baseline: see above  04/22/2022: 5/5 Goal status: ACHIEVED   2.  Patient will report pain at worst rated as </=5/10 to reduce her current functional limitations.  Baseline: 8/10  04/22/2022: 5/10 worst pain Goal status: ACHIEVED   3.  Patient will be able to participate in community activity independently.  Baseline: unable to attend community events independently at this time due to pain.  04/22/2022: Pt reports ability to partake in community activity such as going to plays, but not grocery shopping 06/24/22: Patient reports she is using her RW to participate with all community activity independently.  Goal status: achieved    4.  Patient will score at least 52% on FOTO to signify clinically meaningful improvement in functional abilities.    Baseline: see above Goal status: ongoing  5.  *New 05/13/2022* Patient will ambulate 550 feet in  a 6-minute walk test in order to demonstrate safety with functional community ambulation.    Baseline: see above  Goal status: ongoing  PLAN: PT FREQUENCY: 2x/week   PT DURATION: 6 weeks    PLANNED INTERVENTIONS: Therapeutic exercises, Therapeutic activity, Neuromuscular re-education, Balance training, Gait training, Patient/Family education, Self Care, Joint mobilization, Stair training, Aquatic Therapy, Dry Needling, Cryotherapy, Moist heat, Vasopneumatic device, Manual therapy, and Re-evaluation   PLAN FOR NEXT SESSION: review and progress HEP prn; standing strengthening    Vanessa [place], PT, DPT 06/29/22 1:31 PM

## 2022-07-01 ENCOUNTER — Ambulatory Visit: Payer: Medicare HMO

## 2022-07-01 DIAGNOSIS — M25562 Pain in left knee: Secondary | ICD-10-CM | POA: Diagnosis not present

## 2022-07-01 DIAGNOSIS — G8929 Other chronic pain: Secondary | ICD-10-CM

## 2022-07-01 DIAGNOSIS — M6281 Muscle weakness (generalized): Secondary | ICD-10-CM

## 2022-07-01 DIAGNOSIS — R2689 Other abnormalities of gait and mobility: Secondary | ICD-10-CM

## 2022-07-01 NOTE — Therapy (Signed)
OUTPATIENT PHYSICAL THERAPY TREATMENT NOTE/ DISCHARGE SUMMARY   Patient Name: [name] MRN: [id number] DOB:03/15/1941, 81 y.o., female Today's Date: 07/01/2022  PCP: [name] REFERRING PROVIDER: Frederik Pear, MD  END OF SESSION:   PT End of Session - 07/01/22 1302     Visit Number 22    Number of Visits 25    Date for PT Re-Evaluation 07/01/22    Authorization Type [employer] MCR    Authorization Time Period FOTO V6, V10, kx mod v15    Progress Note Due on Visit 10    PT Start Time 1302    PT Stop Time 1340    PT Time Calculation (min) 38 min    Equipment Utilized During Treatment Gait belt    Activity Tolerance Patient tolerated treatment well    Behavior During Therapy WFL for tasks assessed/performed                                 Past Medical History:  Diagnosis Date   Anemia    PMH;  Patient denies this dx as of 06/03/21   Anxiety    Asthma    Breast cancer ([place]) 1988   left breast cancer   Collagenous colitis    Compression fracture    lumbar 1   Dyspnea    with exertion   Hypertension    Hypothyroidism    Osteoarthritis    Osteopenia    Pneumonia 07/2020   PONV (postoperative nausea and vomiting)    Sleep apnea    wears CPAP nightly   Past Surgical History:  Procedure Laterality Date   BREAST REDUCTION SURGERY     Right   CATARACT EXTRACTION W/ INTRAOCULAR LENS  IMPLANT, BILATERAL     CESAREAN SECTION     COLONOSCOPY W/ BIOPSIES AND POLYPECTOMY     CYST EXCISION N/A 08/30/2018   Procedure: excision of posterior shoulder / back sebaceous cyst;  Surgeon: Wallace Going, DO;  Location: [facility];  Service: Plastics;  Laterality: N/A;   KNEE ARTHROSCOPY Left 02/23/2022   Procedure: LEFT KNEE ARTHROSCOPY;  Surgeon: Melrose Nakayama, MD;  Location: [facility];  Service: Orthopedics;  Laterality: Left;   KYPHOPLASTY N/A 02/18/2016   Procedure: LUMBAR 1 KYPHOPLASTY;  Surgeon: Phylliss Bob, MD;   Location: [facility];  Service: Orthopedics;  Laterality: N/A;  LUMBAR 1 KYPHOPLASTY   KYPHOPLASTY N/A 06/04/2021   Procedure: LUMBAR 2 KYPHOPLASTY;  Surgeon: Phylliss Bob, MD;  Location: [place];  Service: Orthopedics;  Laterality: N/A;   LAPAROSCOPIC APPENDECTOMY N/A 12/09/2021   Procedure: APPENDECTOMY LAPAROSCOPIC;  Surgeon: Ileana Roup, MD;  Location: [facility];  Service: General;  Laterality: N/A;   LUMBAR DISC SURGERY     MODIFIED RADICAL MASTECTOMY W/ AXILLARY LYMPH NODE DISSECTION     Left   RECONSTRUCTION BREAST W/ LATISSIMUS DORSI FLAP     left   REDUCTION MAMMAPLASTY Right    right foot surgery     corrected hammer toe, straightened big toe   SEPTOPLASTY     TOE SURGERY     TONSILLECTOMY AND ADENOIDECTOMY     Patient Active Problem List   Diagnosis Date Noted   Acute appendicitis 12/09/2021   S/P laparoscopic appendectomy 12/09/2021   CAP (community acquired pneumonia) 09/30/2020   Chronic respiratory failure with hypoxia ([place]) 09/23/2020   Allergic rhinitis 08/31/2019   Pilar cyst 08/04/2018   History of breast cancer  in female 06/09/2018   History of reconstruction of both breasts 06/09/2018   Severe obesity (BMI >= 40) ([place]) 09/16/2015   Asthma with acute exacerbation 09/15/2015   DOE (dyspnea on exertion) 01/11/2014   Insomnia 05/05/2012   Osteoarthritis    Osteopenia    Collagenous colitis    OSA (obstructive sleep apnea)    Hypothyroidism    Mild persistent asthma    Hypertension    Breast cancer, left breast ([place])     REFERRING DIAG: s/p left knee scope  THERAPY DIAG:  Chronic pain of left knee  Muscle weakness (generalized)  Other abnormalities of gait and mobility  Rationale for Evaluation and Treatment Rehabilitation  PERTINENT HISTORY: Lt knee partial lateral meniscectomy on 02/23/22, breast cx, anxiety, asthma, compression fx, osteopenia  PRECAUTIONS: Falls  SUBJECTIVE: Pt reports 1/10 knee pain today. She reports readiness for discharge  at this time.   PAIN:  Are you having pain? Yes: NPRS scale: 1.5/10 Pain location: Lt knee Pain description: ache Aggravating factors: weather Relieving factors: movement    OBJECTIVE: (objective measures completed at initial evaluation unless otherwise dated)   DIAGNOSTIC FINDINGS:  Lt knee MRI: IMPRESSION: 1. Advanced tricompartmental osteoarthritis, most severe within the lateral compartment. 2. Advanced degeneration and tearing throughout the lateral meniscus. 3. Small joint effusion with multiple intra-articular loose bodies.   PATIENT SURVEYS:  FOTO 37% function to 52% predicted  04/22/2022: 36%  05/04/2022: 33%  06/24/22: 43% function    COGNITION:           Overall cognitive status: Within functional limits for tasks assessed                          SENSATION: Not tested   EDEMA:  No obvious swelling about the Lt knee      POSTURE: rounded shoulders and forward head   PALPATION: TTP lateral joint line    LOWER EXTREMITY ROM:   Active ROM Right eval Left eval Right 04/09/2022 Left 04/09/2022 Left 04/15/2022 Left 04/22/2022 Left 04/29/2022 Left 05/13/2022 06/08/22 Left  06/22/22 Left 06/24/22 Left  07/01/2022 Left  Hip flexion                Hip extension                Hip abduction                Hip adduction                Hip internal rotation                Hip external rotation                Knee flexion 120 109 113 95, 102 following  AAROM 101, 107 following AAROM 104, 109 following AAROM 113 AROM, 115 PROM 105 AROM, 116 PROM following AAROM 106 106 110 113  Knee extension Unable to accurately assess due to hip flexor tightness  Unable to accurately assess due to hip flexor tightness  -7 -20    -9 AROM      Ankle dorsiflexion                Ankle plantarflexion                Ankle inversion                Ankle eversion                 (  Blank rows = not tested)   LOWER EXTREMITY MMT:   MMT Right eval Left eval  Left 04/22/2022 06/01/22 Left   Hip flexion 4+ 4  4+  Hip extension        Hip abduction        Hip adduction        Hip internal rotation        Hip external rotation        Knee flexion 5 4+ 5/5   Knee extension 5 3- 5/5   Ankle dorsiflexion        Ankle plantarflexion        Ankle inversion        Ankle eversion         (Blank rows = not tested)   LOWER EXTREMITY SPECIAL TESTS:  None    FUNCTIONAL TESTS:  5 x STS 14.65 seconds TUG 23.8 seconds   04/22/2022: TUG: 16 seconds 5xSTS: 12.5 seconds  05/13/2022: TUG: 15 seconds 6MWT: 350 ft, one rest break at 4 minutes to 5 minutes 06/15/22: 480 ft 6 MWT  06/24/22: 498 ft 6 MWT     GAIT: Distance walked: 10 ft  Assistive device utilized: Single point cane Level of assistance: Modified independence Comments: limited knee flexion during stance and swing; WBOS       TODAY'S TREATMENT:  [facility] Adult PT Treatment:                                                DATE: 07/01/2022 Therapeutic Exercise: Seated abdominal isometric into thighs with PPT 3x30sec Deadlift with 15# kettlebell 3x8 Mini lunge with UE support 2x10 BIL Manual Therapy: N/A Neuromuscular re-ed: N/A Therapeutic Activity: Re-assessment of objective measures/ patient goals  Update to HEP Modalities: N/A Self Care: N/A   [facility] Adult PT Treatment:                                                DATE: 06/29/2022 Therapeutic Exercise: Treadmill at 0.56mh x4 minutes while collecting subjective information Deadlift in front of table with10# kettlebell 3x10 Side steps with controlled high knees x4 laps of length of table  Seated hamstring curl with 7# cable 2x10 BIL Seated Lt active hamstring stretch x222m Manual Therapy: N/A Neuromuscular re-ed: N/A Therapeutic Activity: N/A Modalities: N/A Self Care: N/A   [facility] Adult PT Treatment:                                                DATE: 06/24/22 Therapeutic Exercise: Nustep level 6 x 5 minutes  UE/LE Sit to stand 2 x 10; 5 lb kettlebell  Standing hip abduction 2 x 15 @ 3 lbs  Standing hip extension 2 x 15 @ 3 lbs   Therapeutic Activity: Re-assessment to determine overall progress, educating patient on progress towards goals.    PATIENT EDUCATION:  Education details: see treatment  Person educated: patient Education method: demo, instruction Education comprehension: verbalized understanding      HOME EXERCISE PROGRAM: Access Code: [email or web address]: [email or web address] Date: 07/01/2022 Prepared by: TuVanessa DurhamExercises - Supine Heel  Slide  - 1 x daily - 7 x weekly - 1 sets - 10 reps - 5 sec  hold - Heel Raises with Counter Support  - 1 x daily - 7 x weekly - 2 sets - 10 reps - Standing Hip Abduction with Resistance at Ankles and Counter Support  - 1 x daily - 7 x weekly - 2 sets - 10 reps - Deadlift With Dumbbells  - 1 x daily - 7 x weekly - 3 sets - 10 reps - Partial Lunge Matrix with Chair  - 1 x daily - 7 x weekly - 3 sets - 10 reps - Seated Transversus Abdominis Bracing with PLB  - 1 x daily - 7 x weekly - 5 sets - 30 seconds hold   ASSESSMENT:   CLINICAL IMPRESSION: Upon re-assessment of pt goals, she has met or mostly met all of her rehab goals, showing good improvements in Lt knee ROM, 6MWT distance, TUG time, 5xSTS time, FOTO score, and pain levels. She remains limited in functional walking due to respiratory dysfunction, but is not limited in short distances by her knee impairments. Due to these factors, the pt is discharged from PT at this time.     OBJECTIVE IMPAIRMENTS Abnormal gait, decreased activity tolerance, decreased balance, difficulty walking, decreased ROM, decreased strength, improper body mechanics, postural dysfunction, and pain.    ACTIVITY LIMITATIONS carrying, lifting, bending, standing, squatting, stairs, transfers, and locomotion level   PARTICIPATION LIMITATIONS: meal prep, cleaning, laundry, shopping, and community  activity   PERSONAL FACTORS Age, Fitness, Time since onset of injury/illness/exacerbation, and 3+ comorbidities: see PMH above  are also affecting patient's functional outcome.        GOALS: Goals reviewed with patient? Yes   SHORT TERM GOALS: Target date: 04/23/22 Patient will be independent and compliant with initial HEP.    Baseline: issued at eval  04/15/2022: Pt reports adherence to her HEP Goal status: ACHIEVED   2.  Patient will demonstrate at least 120 degrees of Lt knee flexion AROM to improve gait mechanics.  Baseline: see above  Status: see chart above  07/01/2022: 113 degrees  Goal status: PARTIALLY MET   3.  Patient will complete TUG in </= 18 seconds to signify a reduction in fall risk.  Baseline: see above  04/21/2022: 16 seconds 05/13/2022: 15 seconds Goal status: ACHIEVED     LONG TERM GOALS: Target date: 05/14/22   Patient will demonstrate at least 4/5 Lt knee extension strength to improve ability to complete stair and curb negotiation.  Baseline: see above  04/22/2022: 5/5 Goal status: ACHIEVED   2.  Patient will report pain at worst rated as </=5/10 to reduce her current functional limitations.  Baseline: 8/10  04/22/2022: 5/10 worst pain Goal status: ACHIEVED   3.  Patient will be able to participate in community activity independently.  Baseline: unable to attend community events independently at this time due to pain.  04/22/2022: Pt reports ability to partake in community activity such as going to plays, but not grocery shopping 06/24/22: Patient reports she is using her RW to participate with all community activity independently.  Goal status: ACHIEVED   4.  Patient will score at least 52% on FOTO to signify clinically meaningful improvement in functional abilities.    Baseline: see above 07/01/2022: 47% Goal status: PARTIALLY MET  5.  *New 05/13/2022* Patient will ambulate 550 feet in  a 6-minute walk test in order to demonstrate safety  with functional community ambulation.  Baseline: see above  06/24/2022: 498 ft Goal status: PARTIALLY MET     PLAN: PT FREQUENCY: 2x/week   PT DURATION: 6 weeks   PLANNED INTERVENTIONS: Therapeutic exercises, Therapeutic activity, Neuromuscular re-education, Balance training, Gait training, Patient/Family education, Self Care, Joint mobilization, Stair training, Aquatic Therapy, Dry Needling, Cryotherapy, Moist heat, Vasopneumatic device, Manual therapy, and Re-evaluation   PLAN FOR NEXT SESSION: Pt is discharged from PT at this time.   PHYSICAL THERAPY DISCHARGE SUMMARY  Visits from Start of Care: 22  Current functional level related to goals / functional outcomes: Pt has met or mostly met all of her functional goals.   Remaining deficits: Pt is limited in walking by aerobic capacity, and she continues to have low levels of pain   Education / Equipment: Updated HEP   Patient agrees to discharge. Patient goals were met. Patient is being discharged due to meeting the stated rehab goals.   Vanessa Belcher, PT, DPT 07/01/22 1:43 PM

## 2022-09-13 ENCOUNTER — Other Ambulatory Visit: Payer: Self-pay

## 2022-09-13 MED ORDER — POTASSIUM CHLORIDE CRYS ER 20 MEQ PO TBCR
20.0000 meq | EXTENDED_RELEASE_TABLET | Freq: Three times a day (TID) | ORAL | 3 refills | Status: DC
Start: 2022-09-13 — End: 2023-03-21

## 2022-10-08 ENCOUNTER — Other Ambulatory Visit: Payer: Self-pay

## 2022-10-08 DIAGNOSIS — I10 Essential (primary) hypertension: Secondary | ICD-10-CM

## 2022-10-11 ENCOUNTER — Other Ambulatory Visit: Payer: Self-pay | Admitting: Cardiology

## 2022-10-11 DIAGNOSIS — I10 Essential (primary) hypertension: Secondary | ICD-10-CM

## 2022-10-11 NOTE — Therapy (Signed)
OUTPATIENT PHYSICAL THERAPY THORACOLUMBAR EVALUATION   Patient Name: [name] MRN: [id number] DOB:September 10, 1940,81 y.o., female Today's Date: 10/11/2022   END OF SESSION:    Past Medical History:  Diagnosis Date   Anemia    PMH;  Patient denies this dx as of 06/03/21   Anxiety    Asthma    Breast cancer ([facility]) 1988   left breast cancer   Collagenous colitis    Compression fracture    lumbar 1   Dyspnea    with exertion   Hypertension    Hypothyroidism    Osteoarthritis    Osteopenia    Pneumonia 07/2020   PONV (postoperative nausea and vomiting)    Sleep apnea    wears CPAP nightly   Past Surgical History:  Procedure Laterality Date   BREAST REDUCTION SURGERY     Right   CATARACT EXTRACTION W/ INTRAOCULAR LENS  IMPLANT, BILATERAL     CESAREAN SECTION     COLONOSCOPY W/ BIOPSIES AND POLYPECTOMY     CYST EXCISION N/A 08/30/2018   Procedure: excision of posterior shoulder / back sebaceous cyst;  Surgeon: Peggye Formillingham, Claire S, DO;  Location: [facility];  Service: Plastics;  Laterality: N/A;   KNEE ARTHROSCOPY Left 02/23/2022   Procedure: LEFT KNEE ARTHROSCOPY;  Surgeon: Marcene Corningalldorf, Peter, MD;  Location: [facility];  Service: Orthopedics;  Laterality: Left;   KYPHOPLASTY N/A 02/18/2016   Procedure: LUMBAR 1 KYPHOPLASTY;  Surgeon: Estill BambergMark Dumonski, MD;  Location: [facility];  Service: Orthopedics;  Laterality: N/A;  LUMBAR 1 KYPHOPLASTY   KYPHOPLASTY N/A 06/04/2021   Procedure: LUMBAR 2 KYPHOPLASTY;  Surgeon: Estill Bambergumonski, Mark, MD;  Location: [facility];  Service: Orthopedics;  Laterality: N/A;   LAPAROSCOPIC APPENDECTOMY N/A 12/09/2021   Procedure: APPENDECTOMY LAPAROSCOPIC;  Surgeon: Andria MeuseWhite, Christopher M, MD;  Location: [facility];  Service: General;  Laterality: N/A;   LUMBAR DISC SURGERY     MODIFIED RADICAL MASTECTOMY W/ AXILLARY LYMPH NODE DISSECTION     Left   RECONSTRUCTION BREAST W/ LATISSIMUS DORSI FLAP     left   REDUCTION MAMMAPLASTY Right    right foot surgery      corrected hammer toe, straightened big toe   SEPTOPLASTY     TOE SURGERY     TONSILLECTOMY AND ADENOIDECTOMY     Patient Active Problem List   Diagnosis Date Noted   Acute appendicitis 12/09/2021   S/P laparoscopic appendectomy 12/09/2021   CAP (community acquired pneumonia) 09/30/2020   Chronic respiratory failure with hypoxia 09/23/2020   Allergic rhinitis 08/31/2019   Pilar cyst 08/04/2018   History of breast cancer in female 06/09/2018   History of reconstruction of both breasts 06/09/2018   Severe obesity (BMI >= 40) 09/16/2015   Asthma with acute exacerbation 09/15/2015   DOE (dyspnea on exertion) 01/11/2014   Insomnia 05/05/2012   Osteoarthritis    Osteopenia    Collagenous colitis    OSA (obstructive sleep apnea)    Hypothyroidism    Mild persistent asthma    Hypertension    Breast cancer, left breast     PCP: Teena IraniSpencer, Sara C, PA-C   REFERRING PROVIDER: Merryl HackerGrossman, Janelle, NP   REFERRING DIAG: M19.90 (ICD-10-CM) - Unspecified osteoarthritis, unspecified site   R07.81 (ICD-10-CM) - Rib pain   Rationale for Evaluation and Treatment: Rehabilitation  THERAPY DIAG:  No diagnosis found.  ONSET DATE: ***  SUBJECTIVE:  SUBJECTIVE STATEMENT: ***  PERTINENT HISTORY:  ***  PAIN:  Are you having pain? {OPRCPAIN:27236}  PRECAUTIONS: {Therapy precautions:24002}  WEIGHT BEARING RESTRICTIONS: {Yes ***/No:24003}  FALLS:  Has patient fallen in last 6 months? {fallsyesno:27318}  LIVING ENVIRONMENT: Lives with: {[facility] lives with:25569::"lives with their family"} Lives in: {Lives in:25570} Stairs: {opstairs:27293} Has following equipment at home: {Assistive devices:23999}  OCCUPATION: ***  PLOF: {PLOF:24004}  PATIENT GOALS: ***   OBJECTIVE:   DIAGNOSTIC FINDINGS:   ***  PATIENT SURVEYS:  {rehab surveys:24030}  SCREENING FOR RED FLAGS: Bowel or bladder incontinence: {Yes/No:304960894} Spinal tumors: {Yes/No:304960894} Cauda equina syndrome: {Yes/No:304960894} Compression fracture: {Yes/No:304960894} Abdominal aneurysm: {Yes/No:304960894}  COGNITION: Overall cognitive status: {cognition:24006}     SENSATION: {sensation:27233}  MUSCLE LENGTH: Hamstrings: Right *** deg; Left *** deg Thomas test: Right *** deg; Left *** deg  POSTURE: {posture:25561}  PALPATION: ***  LUMBAR ROM:   AROM eval  Flexion   Extension   Right lateral flexion   Left lateral flexion   Right rotation   Left rotation    (Blank rows = not tested)  LOWER EXTREMITY ROM:     Active  Right eval Left eval  Hip flexion    Hip extension    Hip abduction    Hip adduction    Hip internal rotation    Hip external rotation    Knee flexion    Knee extension    Ankle dorsiflexion    Ankle plantarflexion    Ankle inversion    Ankle eversion     (Blank rows = not tested)  LOWER EXTREMITY MMT:    MMT Right eval Left eval  Hip flexion    Hip extension    Hip abduction    Hip adduction    Hip internal rotation    Hip external rotation    Knee flexion    Knee extension    Ankle dorsiflexion    Ankle plantarflexion    Ankle inversion    Ankle eversion     (Blank rows = not tested)  SPECIAL TESTS:  ***  FUNCTIONAL TESTS:  {Functional tests:24029}  GAIT: Distance walked: *** Assistive device utilized: {Assistive devices:23999} Level of assistance: {Levels of assistance:24026} Comments: ***  [facility] Adult PT Treatment:                                                DATE: ***  Therapeutic Exercise: Demonstrated and issued initial HEP.   Manual Therapy: *** Neuromuscular re-ed: *** Therapeutic Activity: Education on assessment findings that will be addressed throughout duration of POC.   Modalities: *** Self Care: ***    PATIENT  EDUCATION:  Education details: see treatment Person educated: Patient Education method: Explanation, Demonstration, Tactile cues, Verbal cues, and Handouts Education comprehension: verbalized understanding, returned demonstration, verbal cues required, tactile cues required, and needs further education  HOME EXERCISE PROGRAM: ***  ASSESSMENT:  CLINICAL IMPRESSION: Patient is a *** y.o. *** who was seen today for physical therapy evaluation and treatment for ***.   OBJECTIVE IMPAIRMENTS: {opptimpairments:25111}.   ACTIVITY LIMITATIONS: {activitylimitations:27494}  PARTICIPATION LIMITATIONS: {participationrestrictions:25113}  PERSONAL FACTORS: {Personal factors:25162} are also affecting patient's functional outcome.   REHAB POTENTIAL: {rehabpotential:25112}  CLINICAL DECISION MAKING: {clinical decision making:25114}  EVALUATION COMPLEXITY: {Evaluation complexity:25115}   GOALS: Goals reviewed with patient? Yes  SHORT TERM GOALS: Target date: ***  Patient will be independent and compliant  with initial HEP.   Baseline: see above Goal status: INITIAL  2.  *** Baseline: see above  Goal status: INITIAL  3.  *** Baseline: see above  Goal status: INITIAL  4.  *** Baseline: see above  Goal status: INITIAL  LONG TERM GOALS: Target date: ***  *** Baseline: see above Goal status: INITIAL  2.  *** Baseline: see above Goal status: INITIAL  3.  *** Baseline: see above Goal status: INITIAL  4.  *** Baseline: see above Goal status: INITIAL  5.  *** Baseline: see above  Goal status: INITIAL  6.  *** Baseline:  Goal status: INITIAL  PLAN:  PT FREQUENCY: {rehab frequency:25116}  PT DURATION: {rehab duration:25117}  PLANNED INTERVENTIONS: {rehab planned interventions:25118::"Therapeutic exercises","Therapeutic activity","Neuromuscular re-education","Balance training","Gait training","Patient/Family education","Self Care","Joint mobilization"}.  PLAN FOR  NEXT SESSION: review and progress HEP prn; ***  Letitia Libra, PT, DPT, ATC 10/11/22 1:58 PM

## 2022-10-12 ENCOUNTER — Ambulatory Visit: Payer: Medicare HMO | Attending: Orthopaedic Surgery

## 2022-10-12 ENCOUNTER — Other Ambulatory Visit: Payer: Self-pay

## 2022-10-12 DIAGNOSIS — M6281 Muscle weakness (generalized): Secondary | ICD-10-CM | POA: Diagnosis present

## 2022-10-12 DIAGNOSIS — M25562 Pain in left knee: Secondary | ICD-10-CM | POA: Insufficient documentation

## 2022-10-12 DIAGNOSIS — R0781 Pleurodynia: Secondary | ICD-10-CM | POA: Diagnosis present

## 2022-10-12 DIAGNOSIS — M25511 Pain in right shoulder: Secondary | ICD-10-CM | POA: Diagnosis present

## 2022-10-12 DIAGNOSIS — G8929 Other chronic pain: Secondary | ICD-10-CM | POA: Insufficient documentation

## 2022-10-12 DIAGNOSIS — M25512 Pain in left shoulder: Secondary | ICD-10-CM | POA: Insufficient documentation

## 2022-10-12 DIAGNOSIS — R262 Difficulty in walking, not elsewhere classified: Secondary | ICD-10-CM | POA: Insufficient documentation

## 2022-10-12 DIAGNOSIS — M25561 Pain in right knee: Secondary | ICD-10-CM | POA: Insufficient documentation

## 2022-10-14 NOTE — Therapy (Signed)
OUTPATIENT PHYSICAL THERAPY TREATMENT NOTE   Patient Name: [name] MRN: [id number] DOB:02-May-1941, 82 y.o., female Today's Date: 10/15/2022  PCP: Teena Irani, PA-C  REFERRING PROVIDER: Merryl Hacker, NP   END OF SESSION:   PT End of Session - 10/15/22 1059     Visit Number 2    Number of Visits 17    Date for PT Re-Evaluation 12/11/22    Authorization Type [employer] MCR    Progress Note Due on Visit 10    PT Start Time 1100    PT Stop Time 1141    PT Time Calculation (min) 41 min    Activity Tolerance Patient tolerated treatment well    Behavior During Therapy [facility] for tasks assessed/performed             Past Medical History:  Diagnosis Date   Anemia    PMH;  Patient denies this dx as of 06/03/21   Anxiety    Asthma    Breast cancer 1988   left breast cancer   Collagenous colitis    Compression fracture    lumbar 1   Dyspnea    with exertion   Hypertension    Hypothyroidism    Osteoarthritis    Osteopenia    Pneumonia 07/2020   PONV (postoperative nausea and vomiting)    Sleep apnea    wears CPAP nightly   Past Surgical History:  Procedure Laterality Date   BREAST REDUCTION SURGERY     Right   CATARACT EXTRACTION W/ INTRAOCULAR LENS  IMPLANT, BILATERAL     CESAREAN SECTION     COLONOSCOPY W/ BIOPSIES AND POLYPECTOMY     CYST EXCISION N/A 08/30/2018   Procedure: excision of posterior shoulder / back sebaceous cyst;  Surgeon: Peggye Form, DO;  Location: [facility];  Service: Plastics;  Laterality: N/A;   KNEE ARTHROSCOPY Left 02/23/2022   Procedure: LEFT KNEE ARTHROSCOPY;  Surgeon: Marcene Corning, MD;  Location: [facility];  Service: Orthopedics;  Laterality: Left;   KYPHOPLASTY N/A 02/18/2016   Procedure: LUMBAR 1 KYPHOPLASTY;  Surgeon: Estill Bamberg, MD;  Location: [facility];  Service: Orthopedics;  Laterality: N/A;  LUMBAR 1 KYPHOPLASTY   KYPHOPLASTY N/A 06/04/2021   Procedure: LUMBAR 2 KYPHOPLASTY;  Surgeon: Estill Bamberg, MD;  Location: [facility];  Service: Orthopedics;  Laterality: N/A;   LAPAROSCOPIC APPENDECTOMY N/A 12/09/2021   Procedure: APPENDECTOMY LAPAROSCOPIC;  Surgeon: Andria Meuse, MD;  Location: [facility];  Service: General;  Laterality: N/A;   LUMBAR DISC SURGERY     MODIFIED RADICAL MASTECTOMY W/ AXILLARY LYMPH NODE DISSECTION     Left   RECONSTRUCTION BREAST W/ LATISSIMUS DORSI FLAP     left   REDUCTION MAMMAPLASTY Right    right foot surgery     corrected hammer toe, straightened big toe   SEPTOPLASTY     TOE SURGERY     TONSILLECTOMY AND ADENOIDECTOMY     Patient Active Problem List   Diagnosis Date Noted   Acute appendicitis 12/09/2021   S/P laparoscopic appendectomy 12/09/2021   CAP (community acquired pneumonia) 09/30/2020   Chronic respiratory failure with hypoxia 09/23/2020   Allergic rhinitis 08/31/2019   Pilar cyst 08/04/2018   History of breast cancer in female 06/09/2018   History of reconstruction of both breasts 06/09/2018   Severe obesity (BMI >= 40) 09/16/2015   Asthma with acute exacerbation 09/15/2015   DOE (dyspnea on exertion) 01/11/2014   Insomnia 05/05/2012   Osteoarthritis  Osteopenia    Collagenous colitis    OSA (obstructive sleep apnea)    Hypothyroidism    Mild persistent asthma    Hypertension    Breast cancer, left breast     REFERRING DIAG: M19.90 (ICD-10-CM) - Unspecified osteoarthritis, unspecified site    R07.81 (ICD-10-CM) - Rib pain   THERAPY DIAG:  Chronic pain of both knees  Chronic pain of both shoulders  Rib pain on left side  Muscle weakness (generalized)  Difficulty in walking, not elsewhere classified  Rationale for Evaluation and Treatment Rehabilitation  PERTINENT HISTORY: Hypertension  Anxiety  History of lumbar compression fracture Osteopenia  OA Kyphoplasty 2017 and 2022 Knee arthroscopy 2023   PRECAUTIONS: fall   SUBJECTIVE:                                                                                                                                                                                       SUBJECTIVE STATEMENT:  Patient reports she is feeling much better after receiving cortisone injection in the Rt shoulder and Rt knee. She was able to drive herself today.    PAIN:  Are you having pain? None currently   OBJECTIVE: (objective measures completed at initial evaluation unless otherwise dated)  DIAGNOSTIC FINDINGS:  No recent imaging on file    PATIENT SURVEYS:  FOTO 40% function to 46% predicted    SCREENING FOR RED FLAGS: Bowel or bladder incontinence: No Spinal tumors: No Cauda equina syndrome: No Compression fracture: No Abdominal aneurysm: No   COGNITION: Overall cognitive status: Within functional limits for tasks assessed                          SENSATION: Not tested   MUSCLE LENGTH: Not tested    POSTURE: slump sitting posture    PALPATION: Not assessed    LUMBAR ROM:    AROM eval  Flexion WNL  Extension 75% limited Lt rib pain  Right lateral flexion WNL Lt rib pain  Left lateral flexion WNL Lt rib pain  Right rotation WNL  Left rotation WNL Lt rib pain   (Blank rows = not tested)   LOWER EXTREMITY ROM:      Active  Right eval Left eval  Hip flexion      Hip extension      Hip abduction      Hip adduction      Hip internal rotation      Hip external rotation      Knee flexion 115 100  Knee extension      Ankle dorsiflexion      Ankle plantarflexion  Ankle inversion      Ankle eversion       (Blank rows = not tested)   LOWER EXTREMITY MMT:     MMT Right eval Left eval  Hip flexion 4 4  Hip extension      Hip abduction 3+ 3+  Hip adduction      Hip internal rotation      Hip external rotation      Knee flexion 5 5  Knee extension 5 5  Ankle dorsiflexion      Ankle plantarflexion      Ankle inversion      Ankle eversion       (Blank rows = not tested) UPPER EXTREMITY MMT:   MMT Right eval Left eval   Shoulder flexion 4- 4-  Shoulder extension      Shoulder abduction 5 5  Shoulder adduction      Shoulder extension      Shoulder internal rotation 5 5  Shoulder external rotation 5 5  Middle trapezius      Lower trapezius      Elbow flexion      Elbow extension      Wrist flexion      Wrist extension      Wrist ulnar deviation      Wrist radial deviation      Wrist pronation      Wrist supination      Grip strength       (Blank rows = not tested) UPPER EXTREMITY ROM:   Active ROM Right eval Left eval  Shoulder flexion [facility]  Shoulder extension      Shoulder abduction [facility]  Shoulder adduction      Shoulder extension      Shoulder internal rotation L5 L5  Shoulder external rotation C5 C5  Elbow flexion      Elbow extension      Wrist flexion      Wrist extension      Wrist ulnar deviation      Wrist radial deviation      Wrist pronation      Wrist supination       (Blank rows = not tested)   SPECIAL TESTS:  Not tested    FUNCTIONAL TESTS:  30 second sit to stand: 2 reps, BUE support    2 MWT: 200 ft with rollator  GAIT: Distance walked: 10 ft  Assistive device utilized: Environmental consultant - 4 wheeled Level of assistance: Modified independence Comments: excessive frontal plane movement, decreased step length, limited push-off  [facility] Adult PT Treatment:                                                DATE: 10/15/22 Therapeutic Exercise: NuStep level 4 x 5 minutes UE/LE  LTR 1 minute  SKTC x 5 each; 5 sec hold  Open book x 10 each  Seated resisted march 2 x 10 blue band  Seated resisted hip abduction 2 x 10 blue band  Seated bicep curl red band 2 x 10  Seated tricep extension red band 2 x 10  Updated HEP    [facility] Adult PT Treatment:  DATE: 10/12/22   Evaluation only            PATIENT EDUCATION:  Education details: HEP Person educated: Patient Education method: Programmer, multimediaxplanation, demo, cues, handout Education  comprehension: verbalized understanding, returned demo, cues   HOME EXERCISE PROGRAM: Access Code: [email or web address] URL: [email or web address] Date: 10/15/2022 Prepared by: Letitia LibraSamantha Donnamarie Shankles  Exercises - Supine Lower Trunk Rotation  - 1 x daily - 7 x weekly - 3 sets - 10 reps - Hooklying Single Knee to Chest Stretch  - 1 x daily - 7 x weekly - 3 sets - 10 reps - Sidelying Thoracic Rotation with Open Book  - 1 x daily - 7 x weekly - 2 sets - 10 reps - Seated Hip Abduction with Resistance  - 1 x daily - 7 x weekly - 2 sets - 10 reps - Seated March with Resistance  - 1 x daily - 7 x weekly - 2 sets - 10 reps - Seated Elbow Flexion with Resistance  - 1 x daily - 7 x weekly - 2 sets - 10 reps - Seated Elbow Extension with Self-Anchored Resistance  - 1 x daily - 7 x weekly - 2 sets - 10 reps   ASSESSMENT:   CLINICAL IMPRESSION: Patient arrives for first PT treatment session reporting an overall improvement in her pain following recent cortisone injections. Initiated trunk mobility and gentle LE/UE strengthening with good tolerance. Patient prefers sitting strengthening exercises as it is difficult for her to perform supine and sidelying exercises at home, so all strengthening exercises issued as part of HEP were in sitting. She reported fatigue in lower extremities with seated strengthening, but no onset of pain.    OBJECTIVE IMPAIRMENTS: Abnormal gait, decreased activity tolerance, decreased balance, decreased endurance, decreased knowledge of condition, difficulty walking, decreased ROM, decreased strength, improper body mechanics, postural dysfunction, and pain.    ACTIVITY LIMITATIONS: carrying, lifting, bending, standing, squatting, transfers, bed mobility, toileting, dressing, reach over head, hygiene/grooming, locomotion level, and caring for others   PARTICIPATION LIMITATIONS: meal prep, cleaning, laundry, driving, shopping, and community activity   PERSONAL FACTORS: Age, Fitness,  Past/current experiences, Time since onset of injury/illness/exacerbation, and 3+ comorbidities: see PMH above  are also affecting patient's functional outcome.    REHAB POTENTIAL: Fair chronicity, previous PT   CLINICAL DECISION MAKING: Evolving/moderate complexity   EVALUATION COMPLEXITY: Moderate     GOALS: Goals reviewed with patient? Yes   SHORT TERM GOALS: Target date: 11/09/2022       Patient will be independent and compliant with initial HEP.    Baseline: no time to issue Goal status: INITIAL   2.  Patient will be independent with car transfers.  Baseline: supervision  Goal status: INITIAL   3.  Patient will complete at least 4 reps during 30 second sit to stand to signify improvements in functional strength.  Baseline: see above  Goal status: INITIAL   4.  Patient will demonstrate pain free trunk AROM to improve tolerance to reaching/bending activity.  Baseline: see above  Goal status: INITIAL   LONG TERM GOALS: Target date: 12/11/2022       Patient will be able to place 2 lbs in overhead shelf with minimal shoulder pain to improve ability to reach and grab items in kitchen shelves.  Baseline: see above Goal status: INITIAL   2.  Patient will score at least 46% on FOTO to signify clinically meaningful improvement in functional abilities.    Baseline: see above Goal status: INITIAL   3.  Patient will walk at least 250 ft during 2 MWT with LRAD to improve tolerance to community ambulation.  Baseline: see above Goal status: INITIAL   4.  Patient will demonstrate at least 4/5 bilateral hip abductor strength to improve stability about the chain with walking/standing activity.  Baseline: see above Goal status: INITIAL   5.  Patient will be independent with advanced home program to assist in management of her chronic condition.  Baseline: see above  Goal status: INITIAL   PLAN:   PT FREQUENCY: 1-2x/week   PT DURATION: 8 weeks   PLANNED INTERVENTIONS:  Therapeutic exercises, Therapeutic activity, Neuromuscular re-education, Balance training, Gait training, Patient/Family education, Self Care, DME instructions, Cryotherapy, Moist heat, Manual therapy, and Re-evaluation.   PLAN FOR NEXT SESSION:shoulder/hip strengthening, trunk mobility.     Letitia Libra, PT, DPT, ATC 10/15/22 11:42 AM

## 2022-10-15 ENCOUNTER — Ambulatory Visit: Payer: Medicare HMO

## 2022-10-15 DIAGNOSIS — M6281 Muscle weakness (generalized): Secondary | ICD-10-CM

## 2022-10-15 DIAGNOSIS — G8929 Other chronic pain: Secondary | ICD-10-CM

## 2022-10-15 DIAGNOSIS — R0781 Pleurodynia: Secondary | ICD-10-CM

## 2022-10-15 DIAGNOSIS — M25561 Pain in right knee: Secondary | ICD-10-CM | POA: Diagnosis not present

## 2022-10-15 DIAGNOSIS — R262 Difficulty in walking, not elsewhere classified: Secondary | ICD-10-CM

## 2022-10-18 ENCOUNTER — Ambulatory Visit: Payer: Medicare HMO

## 2022-10-18 DIAGNOSIS — R262 Difficulty in walking, not elsewhere classified: Secondary | ICD-10-CM

## 2022-10-18 DIAGNOSIS — M6281 Muscle weakness (generalized): Secondary | ICD-10-CM

## 2022-10-18 DIAGNOSIS — M25561 Pain in right knee: Secondary | ICD-10-CM | POA: Diagnosis not present

## 2022-10-18 DIAGNOSIS — R0781 Pleurodynia: Secondary | ICD-10-CM

## 2022-10-18 DIAGNOSIS — G8929 Other chronic pain: Secondary | ICD-10-CM

## 2022-10-18 NOTE — Therapy (Signed)
OUTPATIENT PHYSICAL THERAPY TREATMENT NOTE   Patient Name: [name] MRN: [id number] DOB:1941-05-25, 82 y.o., female Today's Date: 10/18/2022  PCP: Teena Irani, PA-C  REFERRING PROVIDER: Merryl Hacker, NP   END OF SESSION:   PT End of Session - 10/18/22 1500     Visit Number 3    Number of Visits 17    Date for PT Re-Evaluation 12/11/22    Authorization Type [employer] MCR    Progress Note Due on Visit 10    PT Start Time 1500    PT Stop Time 1539    PT Time Calculation (min) 39 min    Activity Tolerance Patient tolerated treatment well    Behavior During Therapy [facility] for tasks assessed/performed             Past Medical History:  Diagnosis Date   Anemia    PMH;  Patient denies this dx as of 06/03/21   Anxiety    Asthma    Breast cancer 1988   left breast cancer   Collagenous colitis    Compression fracture    lumbar 1   Dyspnea    with exertion   Hypertension    Hypothyroidism    Osteoarthritis    Osteopenia    Pneumonia 07/2020   PONV (postoperative nausea and vomiting)    Sleep apnea    wears CPAP nightly   Past Surgical History:  Procedure Laterality Date   BREAST REDUCTION SURGERY     Right   CATARACT EXTRACTION W/ INTRAOCULAR LENS  IMPLANT, BILATERAL     CESAREAN SECTION     COLONOSCOPY W/ BIOPSIES AND POLYPECTOMY     CYST EXCISION N/A 08/30/2018   Procedure: excision of posterior shoulder / back sebaceous cyst;  Surgeon: Peggye Form, DO;  Location: [facility];  Service: Plastics;  Laterality: N/A;   KNEE ARTHROSCOPY Left 02/23/2022   Procedure: LEFT KNEE ARTHROSCOPY;  Surgeon: Marcene Corning, MD;  Location: [facility];  Service: Orthopedics;  Laterality: Left;   KYPHOPLASTY N/A 02/18/2016   Procedure: LUMBAR 1 KYPHOPLASTY;  Surgeon: Estill Bamberg, MD;  Location: [facility];  Service: Orthopedics;  Laterality: N/A;  LUMBAR 1 KYPHOPLASTY   KYPHOPLASTY N/A 06/04/2021   Procedure: LUMBAR 2 KYPHOPLASTY;  Surgeon: Estill Bamberg, MD;  Location: [facility];  Service: Orthopedics;  Laterality: N/A;   LAPAROSCOPIC APPENDECTOMY N/A 12/09/2021   Procedure: APPENDECTOMY LAPAROSCOPIC;  Surgeon: Andria Meuse, MD;  Location: [facility];  Service: General;  Laterality: N/A;   LUMBAR DISC SURGERY     MODIFIED RADICAL MASTECTOMY W/ AXILLARY LYMPH NODE DISSECTION     Left   RECONSTRUCTION BREAST W/ LATISSIMUS DORSI FLAP     left   REDUCTION MAMMAPLASTY Right    right foot surgery     corrected hammer toe, straightened big toe   SEPTOPLASTY     TOE SURGERY     TONSILLECTOMY AND ADENOIDECTOMY     Patient Active Problem List   Diagnosis Date Noted   Acute appendicitis 12/09/2021   S/P laparoscopic appendectomy 12/09/2021   CAP (community acquired pneumonia) 09/30/2020   Chronic respiratory failure with hypoxia 09/23/2020   Allergic rhinitis 08/31/2019   Pilar cyst 08/04/2018   History of breast cancer in female 06/09/2018   History of reconstruction of both breasts 06/09/2018   Severe obesity (BMI >= 40) 09/16/2015   Asthma with acute exacerbation 09/15/2015   DOE (dyspnea on exertion) 01/11/2014   Insomnia 05/05/2012   Osteoarthritis  Osteopenia    Collagenous colitis    OSA (obstructive sleep apnea)    Hypothyroidism    Mild persistent asthma    Hypertension    Breast cancer, left breast     REFERRING DIAG: M19.90 (ICD-10-CM) - Unspecified osteoarthritis, unspecified site    R07.81 (ICD-10-CM) - Rib pain   THERAPY DIAG:  Chronic pain of both knees  Chronic pain of both shoulders  Rib pain on left side  Muscle weakness (generalized)  Difficulty in walking, not elsewhere classified  Rationale for Evaluation and Treatment Rehabilitation  PERTINENT HISTORY: Hypertension  Anxiety  History of lumbar compression fracture Osteopenia  OA Kyphoplasty 2017 and 2022 Knee arthroscopy 2023   PRECAUTIONS: fall   SUBJECTIVE:                                                                                                                                                                                       SUBJECTIVE STATEMENT:  "Still working out some of the soreness." No pain currently. She reports HEP is going well.    PAIN:  Are you having pain? None currently   OBJECTIVE: (objective measures completed at initial evaluation unless otherwise dated)  DIAGNOSTIC FINDINGS:  No recent imaging on file    PATIENT SURVEYS:  FOTO 40% function to 46% predicted    SCREENING FOR RED FLAGS: Bowel or bladder incontinence: No Spinal tumors: No Cauda equina syndrome: No Compression fracture: No Abdominal aneurysm: No   COGNITION: Overall cognitive status: Within functional limits for tasks assessed                          SENSATION: Not tested   MUSCLE LENGTH: Not tested    POSTURE: slump sitting posture    PALPATION: Not assessed    LUMBAR ROM:    AROM eval  Flexion WNL  Extension 75% limited Lt rib pain  Right lateral flexion WNL Lt rib pain  Left lateral flexion WNL Lt rib pain  Right rotation WNL  Left rotation WNL Lt rib pain   (Blank rows = not tested)   LOWER EXTREMITY ROM:      Active  Right eval Left eval  Hip flexion      Hip extension      Hip abduction      Hip adduction      Hip internal rotation      Hip external rotation      Knee flexion 115 100  Knee extension      Ankle dorsiflexion      Ankle plantarflexion      Ankle inversion  Ankle eversion       (Blank rows = not tested)   LOWER EXTREMITY MMT:     MMT Right eval Left eval  Hip flexion 4 4  Hip extension      Hip abduction 3+ 3+  Hip adduction      Hip internal rotation      Hip external rotation      Knee flexion 5 5  Knee extension 5 5  Ankle dorsiflexion      Ankle plantarflexion      Ankle inversion      Ankle eversion       (Blank rows = not tested) UPPER EXTREMITY MMT:   MMT Right eval Left eval  Shoulder flexion 4- 4-  Shoulder extension      Shoulder  abduction 5 5  Shoulder adduction      Shoulder extension      Shoulder internal rotation 5 5  Shoulder external rotation 5 5  Middle trapezius      Lower trapezius      Elbow flexion      Elbow extension      Wrist flexion      Wrist extension      Wrist ulnar deviation      Wrist radial deviation      Wrist pronation      Wrist supination      Grip strength       (Blank rows = not tested) UPPER EXTREMITY ROM:   Active ROM Right eval Left eval  Shoulder flexion [facility]  Shoulder extension      Shoulder abduction [facility]  Shoulder adduction      Shoulder extension      Shoulder internal rotation L5 L5  Shoulder external rotation C5 C5  Elbow flexion      Elbow extension      Wrist flexion      Wrist extension      Wrist ulnar deviation      Wrist radial deviation      Wrist pronation      Wrist supination       (Blank rows = not tested)   SPECIAL TESTS:  Not tested    FUNCTIONAL TESTS:  30 second sit to stand: 2 reps, BUE support    2 MWT: 200 ft with rollator  GAIT: Distance walked: 10 ft  Assistive device utilized: Environmental consultant - 4 wheeled Level of assistance: Modified independence Comments: excessive frontal plane movement, decreased step length, limited push-off  [facility] Adult PT Treatment:                                                DATE: 10/18/22 Therapeutic Exercise: NuStep level 5 x 5 minutes UE/LE  Seated resisted shoulder flexion 1lb 2 x 10  Seated resisted shoulder scaption 1lb 2 x 10  Seated resisted shoulder abduction 1 lb 2 x 10  Seated resisted horizontal shoulder abduction yellow band 2 x 10  Bilateral shoulder ER yellow band 2 x 10  Seated bicep curl 2 x 10 1 lb Stability ball rollout 3 way x 2 minutes  Seated trunk rotation with med ball green 2 x 10      [facility] Adult PT Treatment:  DATE: 10/15/22 Therapeutic Exercise: NuStep level 4 x 5 minutes UE/LE  LTR 1 minute  SKTC x 5 each; 5 sec hold   Open book x 10 each  Seated resisted march 2 x 10 blue band  Seated resisted hip abduction 2 x 10 blue band  Seated bicep curl red band 2 x 10  Seated tricep extension red band 2 x 10  Updated HEP           PATIENT EDUCATION:  Education details: HEP review Person educated: Patient Education method: Explanation Education comprehension: verbalized understanding   HOME EXERCISE PROGRAM: Access Code: [email or web address] URL: [email or web address] Date: 10/15/2022 Prepared by: Letitia Libra  Exercises - Supine Lower Trunk Rotation  - 1 x daily - 7 x weekly - 3 sets - 10 reps - Hooklying Single Knee to Chest Stretch  - 1 x daily - 7 x weekly - 3 sets - 10 reps - Sidelying Thoracic Rotation with Open Book  - 1 x daily - 7 x weekly - 2 sets - 10 reps - Seated Hip Abduction with Resistance  - 1 x daily - 7 x weekly - 2 sets - 10 reps - Seated March with Resistance  - 1 x daily - 7 x weekly - 2 sets - 10 reps - Seated Elbow Flexion with Resistance  - 1 x daily - 7 x weekly - 2 sets - 10 reps - Seated Elbow Extension with Self-Anchored Resistance  - 1 x daily - 7 x weekly - 2 sets - 10 reps   ASSESSMENT:   CLINICAL IMPRESSION: Patient arrives without reports of pain, but does report ongoing soreness in her legs following PT session on Friday. Focused on UE strengthening today with good tolerance, but quickly fatigues. Consistent postural cues required to reduce shoulder shrug.    OBJECTIVE IMPAIRMENTS: Abnormal gait, decreased activity tolerance, decreased balance, decreased endurance, decreased knowledge of condition, difficulty walking, decreased ROM, decreased strength, improper body mechanics, postural dysfunction, and pain.    ACTIVITY LIMITATIONS: carrying, lifting, bending, standing, squatting, transfers, bed mobility, toileting, dressing, reach over head, hygiene/grooming, locomotion level, and caring for others   PARTICIPATION LIMITATIONS: meal prep, cleaning, laundry,  driving, shopping, and community activity   PERSONAL FACTORS: Age, Fitness, Past/current experiences, Time since onset of injury/illness/exacerbation, and 3+ comorbidities: see PMH above  are also affecting patient's functional outcome.    REHAB POTENTIAL: Fair chronicity, previous PT   CLINICAL DECISION MAKING: Evolving/moderate complexity   EVALUATION COMPLEXITY: Moderate     GOALS: Goals reviewed with patient? Yes   SHORT TERM GOALS: Target date: 11/09/2022       Patient will be independent and compliant with initial HEP.    Baseline: no time to issue Goal status: INITIAL   2.  Patient will be independent with car transfers.  Baseline: supervision  Goal status: INITIAL   3.  Patient will complete at least 4 reps during 30 second sit to stand to signify improvements in functional strength.  Baseline: see above  Goal status: INITIAL   4.  Patient will demonstrate pain free trunk AROM to improve tolerance to reaching/bending activity.  Baseline: see above  Goal status: INITIAL   LONG TERM GOALS: Target date: 12/11/2022       Patient will be able to place 2 lbs in overhead shelf with minimal shoulder pain to improve ability to reach and grab items in kitchen shelves.  Baseline: see above Goal status: INITIAL   2.  Patient will score at least 46%  on FOTO to signify clinically meaningful improvement in functional abilities.    Baseline: see above Goal status: INITIAL   3.  Patient will walk at least 250 ft during 2 MWT with LRAD to improve tolerance to community ambulation.  Baseline: see above Goal status: INITIAL   4.  Patient will demonstrate at least 4/5 bilateral hip abductor strength to improve stability about the chain with walking/standing activity.  Baseline: see above Goal status: INITIAL   5.  Patient will be independent with advanced home program to assist in management of her chronic condition.  Baseline: see above  Goal status: INITIAL   PLAN:   PT  FREQUENCY: 1-2x/week   PT DURATION: 8 weeks   PLANNED INTERVENTIONS: Therapeutic exercises, Therapeutic activity, Neuromuscular re-education, Balance training, Gait training, Patient/Family education, Self Care, DME instructions, Cryotherapy, Moist heat, Manual therapy, and Re-evaluation.   PLAN FOR NEXT SESSION:shoulder/hip strengthening, trunk mobility.     Letitia Libra, PT, DPT, ATC 10/18/22 3:40 PM

## 2022-10-19 ENCOUNTER — Ambulatory Visit: Payer: Medicare HMO

## 2022-10-19 DIAGNOSIS — R262 Difficulty in walking, not elsewhere classified: Secondary | ICD-10-CM

## 2022-10-19 DIAGNOSIS — G8929 Other chronic pain: Secondary | ICD-10-CM

## 2022-10-19 DIAGNOSIS — R0781 Pleurodynia: Secondary | ICD-10-CM

## 2022-10-19 DIAGNOSIS — M25561 Pain in right knee: Secondary | ICD-10-CM | POA: Diagnosis not present

## 2022-10-19 DIAGNOSIS — M6281 Muscle weakness (generalized): Secondary | ICD-10-CM

## 2022-10-19 NOTE — Therapy (Signed)
OUTPATIENT PHYSICAL THERAPY TREATMENT NOTE   Patient Name: [name] MRN: [id number] DOB:02/19/1941, 82 y.o., female Today's Date: 10/19/2022  PCP: Teena Irani, PA-C  REFERRING PROVIDER: Merryl Hacker, NP   END OF SESSION:   PT End of Session - 10/19/22 1536     Visit Number 4    Number of Visits 17    Date for PT Re-Evaluation 12/11/22    Authorization Type [employer] MCR    Progress Note Due on Visit 10    PT Start Time 1535    PT Stop Time 1615    PT Time Calculation (min) 40 min    Activity Tolerance Patient tolerated treatment well    Behavior During Therapy [facility] for tasks assessed/performed             Past Medical History:  Diagnosis Date   Anemia    PMH;  Patient denies this dx as of 06/03/21   Anxiety    Asthma    Breast cancer 1988   left breast cancer   Collagenous colitis    Compression fracture    lumbar 1   Dyspnea    with exertion   Hypertension    Hypothyroidism    Osteoarthritis    Osteopenia    Pneumonia 07/2020   PONV (postoperative nausea and vomiting)    Sleep apnea    wears CPAP nightly   Past Surgical History:  Procedure Laterality Date   BREAST REDUCTION SURGERY     Right   CATARACT EXTRACTION W/ INTRAOCULAR LENS  IMPLANT, BILATERAL     CESAREAN SECTION     COLONOSCOPY W/ BIOPSIES AND POLYPECTOMY     CYST EXCISION N/A 08/30/2018   Procedure: excision of posterior shoulder / back sebaceous cyst;  Surgeon: Peggye Form, DO;  Location: [facility];  Service: Plastics;  Laterality: N/A;   KNEE ARTHROSCOPY Left 02/23/2022   Procedure: LEFT KNEE ARTHROSCOPY;  Surgeon: Marcene Corning, MD;  Location: [facility];  Service: Orthopedics;  Laterality: Left;   KYPHOPLASTY N/A 02/18/2016   Procedure: LUMBAR 1 KYPHOPLASTY;  Surgeon: Estill Bamberg, MD;  Location: [facility];  Service: Orthopedics;  Laterality: N/A;  LUMBAR 1 KYPHOPLASTY   KYPHOPLASTY N/A 06/04/2021   Procedure: LUMBAR 2 KYPHOPLASTY;  Surgeon: Estill Bamberg, MD;  Location: [facility];  Service: Orthopedics;  Laterality: N/A;   LAPAROSCOPIC APPENDECTOMY N/A 12/09/2021   Procedure: APPENDECTOMY LAPAROSCOPIC;  Surgeon: Andria Meuse, MD;  Location: [facility];  Service: General;  Laterality: N/A;   LUMBAR DISC SURGERY     MODIFIED RADICAL MASTECTOMY W/ AXILLARY LYMPH NODE DISSECTION     Left   RECONSTRUCTION BREAST W/ LATISSIMUS DORSI FLAP     left   REDUCTION MAMMAPLASTY Right    right foot surgery     corrected hammer toe, straightened big toe   SEPTOPLASTY     TOE SURGERY     TONSILLECTOMY AND ADENOIDECTOMY     Patient Active Problem List   Diagnosis Date Noted   Acute appendicitis 12/09/2021   S/P laparoscopic appendectomy 12/09/2021   CAP (community acquired pneumonia) 09/30/2020   Chronic respiratory failure with hypoxia 09/23/2020   Allergic rhinitis 08/31/2019   Pilar cyst 08/04/2018   History of breast cancer in female 06/09/2018   History of reconstruction of both breasts 06/09/2018   Severe obesity (BMI >= 40) 09/16/2015   Asthma with acute exacerbation 09/15/2015   DOE (dyspnea on exertion) 01/11/2014   Insomnia 05/05/2012   Osteoarthritis  Osteopenia    Collagenous colitis    OSA (obstructive sleep apnea)    Hypothyroidism    Mild persistent asthma    Hypertension    Breast cancer, left breast     REFERRING DIAG: M19.90 (ICD-10-CM) - Unspecified osteoarthritis, unspecified site    R07.81 (ICD-10-CM) - Rib pain   THERAPY DIAG:  Chronic pain of both knees  Chronic pain of both shoulders  Rib pain on left side  Muscle weakness (generalized)  Difficulty in walking, not elsewhere classified  Rationale for Evaluation and Treatment Rehabilitation  PERTINENT HISTORY: Hypertension  Anxiety  History of lumbar compression fracture Osteopenia  OA Kyphoplasty 2017 and 2022 Knee arthroscopy 2023   PRECAUTIONS: fall   SUBJECTIVE:                                                                                                                                                                                       SUBJECTIVE STATEMENT:  "Some soreness in the ribcage, but no pain."    PAIN:  Are you having pain? None currently   OBJECTIVE: (objective measures completed at initial evaluation unless otherwise dated)  DIAGNOSTIC FINDINGS:  No recent imaging on file    PATIENT SURVEYS:  FOTO 40% function to 46% predicted    SCREENING FOR RED FLAGS: Bowel or bladder incontinence: No Spinal tumors: No Cauda equina syndrome: No Compression fracture: No Abdominal aneurysm: No   COGNITION: Overall cognitive status: Within functional limits for tasks assessed                          SENSATION: Not tested   MUSCLE LENGTH: Not tested    POSTURE: slump sitting posture    PALPATION: Not assessed    LUMBAR ROM:    AROM eval  Flexion WNL  Extension 75% limited Lt rib pain  Right lateral flexion WNL Lt rib pain  Left lateral flexion WNL Lt rib pain  Right rotation WNL  Left rotation WNL Lt rib pain   (Blank rows = not tested)   LOWER EXTREMITY ROM:      Active  Right eval Left eval  Hip flexion      Hip extension      Hip abduction      Hip adduction      Hip internal rotation      Hip external rotation      Knee flexion 115 100  Knee extension      Ankle dorsiflexion      Ankle plantarflexion      Ankle inversion      Ankle eversion       (  Blank rows = not tested)   LOWER EXTREMITY MMT:     MMT Right eval Left eval  Hip flexion 4 4  Hip extension      Hip abduction 3+ 3+  Hip adduction      Hip internal rotation      Hip external rotation      Knee flexion 5 5  Knee extension 5 5  Ankle dorsiflexion      Ankle plantarflexion      Ankle inversion      Ankle eversion       (Blank rows = not tested) UPPER EXTREMITY MMT:   MMT Right eval Left eval  Shoulder flexion 4- 4-  Shoulder extension      Shoulder abduction 5 5  Shoulder adduction      Shoulder  extension      Shoulder internal rotation 5 5  Shoulder external rotation 5 5  Middle trapezius      Lower trapezius      Elbow flexion      Elbow extension      Wrist flexion      Wrist extension      Wrist ulnar deviation      Wrist radial deviation      Wrist pronation      Wrist supination      Grip strength       (Blank rows = not tested) UPPER EXTREMITY ROM:   Active ROM Right eval Left eval  Shoulder flexion [facility]  Shoulder extension      Shoulder abduction [facility]  Shoulder adduction      Shoulder extension      Shoulder internal rotation L5 L5  Shoulder external rotation C5 C5  Elbow flexion      Elbow extension      Wrist flexion      Wrist extension      Wrist ulnar deviation      Wrist radial deviation      Wrist pronation      Wrist supination       (Blank rows = not tested)   SPECIAL TESTS:  Not tested    FUNCTIONAL TESTS:  30 second sit to stand: 2 reps, BUE support    2 MWT: 200 ft with rollator   10/19/22: 2 MWT: 200 ft with rollator   GAIT: Distance walked: 10 ft  Assistive device utilized: Environmental consultant - 4 wheeled Level of assistance: Modified independence Comments: excessive frontal plane movement, decreased step length, limited push-off  [facility] Adult PT Treatment:                                                DATE: 10/19/22 Therapeutic Exercise: 2 minute walk  LAQ 2 x 10; 1 lb  HS curl green band 2 x 10  Seated march 2 x 10; 1 lb  Clamshells 2 x 10  Supine TA march 2 x 10  Supine diaphragmatic breathing x 10  Seated thoracic extension x 10  Standing calf raise 2 x 10  Updated HEP     [facility] Adult PT Treatment:  DATE: 10/18/22 Therapeutic Exercise: NuStep level 5 x 5 minutes UE/LE  Seated resisted shoulder flexion 1lb 2 x 10  Seated resisted shoulder scaption 1lb 2 x 10  Seated resisted shoulder abduction 1 lb 2 x 10  Seated resisted horizontal shoulder abduction yellow band 2 x 10   Bilateral shoulder ER yellow band 2 x 10  Seated bicep curl 2 x 10 1 lb Stability ball rollout 3 way x 2 minutes  Seated trunk rotation with med ball green 2 x 10      [facility] Adult PT Treatment:                                                DATE: 10/15/22 Therapeutic Exercise: NuStep level 4 x 5 minutes UE/LE  LTR 1 minute  SKTC x 5 each; 5 sec hold  Open book x 10 each  Seated resisted march 2 x 10 blue band  Seated resisted hip abduction 2 x 10 blue band  Seated bicep curl red band 2 x 10  Seated tricep extension red band 2 x 10  Updated HEP           PATIENT EDUCATION:  Education details: HEP update Person educated: Patient Education method: Explanation, demo, cues, handout Education comprehension: verbalized understanding, returned demo, cues    HOME EXERCISE PROGRAM: Access Code: [email or web address] URL: [email or web address] Date: 10/15/2022 Prepared by: Letitia Libra  Exercises - Supine Lower Trunk Rotation  - 1 x daily - 7 x weekly - 3 sets - 10 reps - Hooklying Single Knee to Chest Stretch  - 1 x daily - 7 x weekly - 3 sets - 10 reps - Sidelying Thoracic Rotation with Open Book  - 1 x daily - 7 x weekly - 2 sets - 10 reps - Seated Hip Abduction with Resistance  - 1 x daily - 7 x weekly - 2 sets - 10 reps - Seated March with Resistance  - 1 x daily - 7 x weekly - 2 sets - 10 reps - Seated Elbow Flexion with Resistance  - 1 x daily - 7 x weekly - 2 sets - 10 reps - Seated Elbow Extension with Self-Anchored Resistance  - 1 x daily - 7 x weekly - 2 sets - 10 reps   ASSESSMENT:   CLINICAL IMPRESSION: Patient arrives without reports of pain, but has some soreness in her ribcage after yesterday's session. Focused on LE strengthening and trunk mobility with good tolerance. With diaphragmatic breathing and seated trunk extension she reported a pulling sensation about the ribcage, but was not painful. Introduced standing activity today with patient quickly fatiguing.  2 MWT was re-assessed with no change in distance noted compared to evaluation.    OBJECTIVE IMPAIRMENTS: Abnormal gait, decreased activity tolerance, decreased balance, decreased endurance, decreased knowledge of condition, difficulty walking, decreased ROM, decreased strength, improper body mechanics, postural dysfunction, and pain.    ACTIVITY LIMITATIONS: carrying, lifting, bending, standing, squatting, transfers, bed mobility, toileting, dressing, reach over head, hygiene/grooming, locomotion level, and caring for others   PARTICIPATION LIMITATIONS: meal prep, cleaning, laundry, driving, shopping, and community activity   PERSONAL FACTORS: Age, Fitness, Past/current experiences, Time since onset of injury/illness/exacerbation, and 3+ comorbidities: see PMH above  are also affecting patient's functional outcome.    REHAB POTENTIAL: Fair chronicity, previous PT   CLINICAL DECISION MAKING: Evolving/moderate complexity  EVALUATION COMPLEXITY: Moderate     GOALS: Goals reviewed with patient? Yes   SHORT TERM GOALS: Target date: 11/09/2022       Patient will be independent and compliant with initial HEP.    Baseline: no time to issue Goal status: INITIAL   2.  Patient will be independent with car transfers.  Baseline: supervision  Goal status: INITIAL   3.  Patient will complete at least 4 reps during 30 second sit to stand to signify improvements in functional strength.  Baseline: see above  Goal status: INITIAL   4.  Patient will demonstrate pain free trunk AROM to improve tolerance to reaching/bending activity.  Baseline: see above  Goal status: INITIAL   LONG TERM GOALS: Target date: 12/11/2022       Patient will be able to place 2 lbs in overhead shelf with minimal shoulder pain to improve ability to reach and grab items in kitchen shelves.  Baseline: see above Goal status: INITIAL   2.  Patient will score at least 46% on FOTO to signify clinically meaningful  improvement in functional abilities.    Baseline: see above Goal status: INITIAL   3.  Patient will walk at least 250 ft during 2 MWT with LRAD to improve tolerance to community ambulation.  Baseline: see above Goal status: INITIAL   4.  Patient will demonstrate at least 4/5 bilateral hip abductor strength to improve stability about the chain with walking/standing activity.  Baseline: see above Goal status: INITIAL   5.  Patient will be independent with advanced home program to assist in management of her chronic condition.  Baseline: see above  Goal status: INITIAL   PLAN:   PT FREQUENCY: 1-2x/week   PT DURATION: 8 weeks   PLANNED INTERVENTIONS: Therapeutic exercises, Therapeutic activity, Neuromuscular re-education, Balance training, Gait training, Patient/Family education, Self Care, DME instructions, Cryotherapy, Moist heat, Manual therapy, and Re-evaluation.   PLAN FOR NEXT SESSION:shoulder/hip strengthening, trunk mobility.     Letitia Libra, PT, DPT, ATC 10/19/22 4:15 PM

## 2022-10-26 ENCOUNTER — Ambulatory Visit: Payer: Medicare HMO

## 2022-10-26 DIAGNOSIS — R0781 Pleurodynia: Secondary | ICD-10-CM

## 2022-10-26 DIAGNOSIS — M25561 Pain in right knee: Secondary | ICD-10-CM | POA: Diagnosis not present

## 2022-10-26 DIAGNOSIS — G8929 Other chronic pain: Secondary | ICD-10-CM

## 2022-10-26 DIAGNOSIS — R262 Difficulty in walking, not elsewhere classified: Secondary | ICD-10-CM

## 2022-10-26 DIAGNOSIS — M6281 Muscle weakness (generalized): Secondary | ICD-10-CM

## 2022-10-26 NOTE — Therapy (Signed)
OUTPATIENT PHYSICAL THERAPY TREATMENT NOTE   Patient Name: [name] MRN: [id number] DOB:[birth year], 82 y.o., female Today's Date: 10/26/2022  PCP: Teena Irani, PA-C  REFERRING PROVIDER: Merryl Hacker, NP   END OF SESSION:   PT End of Session - 10/26/22 1255     Visit Number 5    Number of Visits 17    Date for PT Re-Evaluation 12/11/22    Authorization Type [employer] MCR    Authorization Time Period FOTO V6, V10, kx mod v15    Progress Note Due on Visit 10    PT Start Time 1255    PT Stop Time 1335    PT Time Calculation (min) 40 min    Activity Tolerance Patient tolerated treatment well    Behavior During Therapy WFL for tasks assessed/performed              Past Medical History:  Diagnosis Date   Anemia    PMH;  Patient denies this dx as of 06/03/21   Anxiety    Asthma    Breast cancer 1988   left breast cancer   Collagenous colitis    Compression fracture    lumbar 1   Dyspnea    with exertion   Hypertension    Hypothyroidism    Osteoarthritis    Osteopenia    Pneumonia 07/2020   PONV (postoperative nausea and vomiting)    Sleep apnea    wears CPAP nightly   Past Surgical History:  Procedure Laterality Date   BREAST REDUCTION SURGERY     Right   CATARACT EXTRACTION W/ INTRAOCULAR LENS  IMPLANT, BILATERAL     CESAREAN SECTION     COLONOSCOPY W/ BIOPSIES AND POLYPECTOMY     CYST EXCISION N/A 08/30/2018   Procedure: excision of posterior shoulder / back sebaceous cyst;  Surgeon: Peggye Form, DO;  Location: [facility];  Service: Plastics;  Laterality: N/A;   KNEE ARTHROSCOPY Left 02/23/2022   Procedure: LEFT KNEE ARTHROSCOPY;  Surgeon: Marcene Corning, MD;  Location: [facility];  Service: Orthopedics;  Laterality: Left;   KYPHOPLASTY N/A 02/18/2016   Procedure: LUMBAR 1 KYPHOPLASTY;  Surgeon: Estill Bamberg, MD;  Location: [facility];  Service: Orthopedics;  Laterality: N/A;  LUMBAR 1 KYPHOPLASTY   KYPHOPLASTY N/A 06/04/2021    Procedure: LUMBAR 2 KYPHOPLASTY;  Surgeon: Estill Bamberg, MD;  Location: [facility];  Service: Orthopedics;  Laterality: N/A;   LAPAROSCOPIC APPENDECTOMY N/A 12/09/2021   Procedure: APPENDECTOMY LAPAROSCOPIC;  Surgeon: Andria Meuse, MD;  Location: [facility];  Service: General;  Laterality: N/A;   LUMBAR DISC SURGERY     MODIFIED RADICAL MASTECTOMY W/ AXILLARY LYMPH NODE DISSECTION     Left   RECONSTRUCTION BREAST W/ LATISSIMUS DORSI FLAP     left   REDUCTION MAMMAPLASTY Right    right foot surgery     corrected hammer toe, straightened big toe   SEPTOPLASTY     TOE SURGERY     TONSILLECTOMY AND ADENOIDECTOMY     Patient Active Problem List   Diagnosis Date Noted   Acute appendicitis 12/09/2021   S/P laparoscopic appendectomy 12/09/2021   CAP (community acquired pneumonia) 09/30/2020   Chronic respiratory failure with hypoxia 09/23/2020   Allergic rhinitis 08/31/2019   Pilar cyst 08/04/2018   History of breast cancer in female 06/09/2018   History of reconstruction of both breasts 06/09/2018   Severe obesity (BMI >= 40) 09/16/2015   Asthma with acute exacerbation 09/15/2015   DOE (  dyspnea on exertion) 01/11/2014   Insomnia 05/05/2012   Osteoarthritis    Osteopenia    Collagenous colitis    OSA (obstructive sleep apnea)    Hypothyroidism    Mild persistent asthma    Hypertension    Breast cancer, left breast     REFERRING DIAG: M19.90 (ICD-10-CM) - Unspecified osteoarthritis, unspecified site    R07.81 (ICD-10-CM) - Rib pain   THERAPY DIAG:  Chronic pain of both knees  Chronic pain of both shoulders  Rib pain on left side  Muscle weakness (generalized)  Difficulty in walking, not elsewhere classified  Rationale for Evaluation and Treatment Rehabilitation  PERTINENT HISTORY: Hypertension  Anxiety  History of lumbar compression fracture Osteopenia  OA Kyphoplasty 2017 and 2022 Knee arthroscopy 2023   PRECAUTIONS: fall   SUBJECTIVE:                                                                                                                                                                                       SUBJECTIVE STATEMENT:  Patient reports continued soreness in ribcage, increased pain in lower back pain.   PAIN:  Are you having pain? Yes 4/10 Lower back  OBJECTIVE: (objective measures completed at initial evaluation unless otherwise dated)  DIAGNOSTIC FINDINGS:  No recent imaging on file    PATIENT SURVEYS:  FOTO 40% function to 46% predicted    SCREENING FOR RED FLAGS: Bowel or bladder incontinence: No Spinal tumors: No Cauda equina syndrome: No Compression fracture: No Abdominal aneurysm: No   COGNITION: Overall cognitive status: Within functional limits for tasks assessed                          SENSATION: Not tested   MUSCLE LENGTH: Not tested    POSTURE: slump sitting posture    PALPATION: Not assessed    LUMBAR ROM:    AROM eval  Flexion WNL  Extension 75% limited Lt rib pain  Right lateral flexion WNL Lt rib pain  Left lateral flexion WNL Lt rib pain  Right rotation WNL  Left rotation WNL Lt rib pain   (Blank rows = not tested)   LOWER EXTREMITY ROM:      Active  Right eval Left eval  Hip flexion      Hip extension      Hip abduction      Hip adduction      Hip internal rotation      Hip external rotation      Knee flexion 115 100  Knee extension      Ankle dorsiflexion      Ankle plantarflexion  Ankle inversion      Ankle eversion       (Blank rows = not tested)   LOWER EXTREMITY MMT:     MMT Right eval Left eval  Hip flexion 4 4  Hip extension      Hip abduction 3+ 3+  Hip adduction      Hip internal rotation      Hip external rotation      Knee flexion 5 5  Knee extension 5 5  Ankle dorsiflexion      Ankle plantarflexion      Ankle inversion      Ankle eversion       (Blank rows = not tested) UPPER EXTREMITY MMT:   MMT Right eval Left eval  Shoulder  flexion 4- 4-  Shoulder extension      Shoulder abduction 5 5  Shoulder adduction      Shoulder extension      Shoulder internal rotation 5 5  Shoulder external rotation 5 5  Middle trapezius      Lower trapezius      Elbow flexion      Elbow extension      Wrist flexion      Wrist extension      Wrist ulnar deviation      Wrist radial deviation      Wrist pronation      Wrist supination      Grip strength       (Blank rows = not tested) UPPER EXTREMITY ROM:   Active ROM Right eval Left eval  Shoulder flexion [facility]  Shoulder extension      Shoulder abduction [facility]  Shoulder adduction      Shoulder extension      Shoulder internal rotation L5 L5  Shoulder external rotation C5 C5  Elbow flexion      Elbow extension      Wrist flexion      Wrist extension      Wrist ulnar deviation      Wrist radial deviation      Wrist pronation      Wrist supination       (Blank rows = not tested)   SPECIAL TESTS:  Not tested    FUNCTIONAL TESTS:  30 second sit to stand: 2 reps, BUE support    2 MWT: 200 ft with rollator   10/19/22: 2 MWT: 200 ft with rollator   GAIT: Distance walked: 10 ft  Assistive device utilized: Environmental consultant - 4 wheeled Level of assistance: Modified independence Comments: excessive frontal plane movement, decreased step length, limited push-off   TREATMENT  [facility] Adult PT Treatment:                                                DATE: 10/26/22 Therapeutic Exercise: NuStep level 5 x 5 minutes UE/LE  Seated horizontal abduction RTB 2x10 Seated scaption no weight 2x10 LAQ 2 x 10; 2 lb  HS curl green band 2 x 10  Seated march 2 x 30"; 2 lb  Clamshells 2 x 10  Supine chest press 2# with dowel x10 Supine TA march GTB 2 x 10  Supine shoulder flexion/extension with dowel and focus on inhale/exhale diaphragmatic breathing x10   [facility] Adult PT Treatment:  DATE: 10/19/22 Therapeutic Exercise: 2 minute walk   LAQ 2 x 10; 1 lb  HS curl green band 2 x 10  Seated march 2 x 10; 1 lb  Clamshells 2 x 10  Supine TA march 2 x 10  Supine diaphragmatic breathing x 10  Seated thoracic extension x 10  Standing calf raise 2 x 10  Updated HEP    [facility] Adult PT Treatment:                                                DATE: 10/18/22 Therapeutic Exercise: NuStep level 5 x 5 minutes UE/LE  Seated resisted shoulder flexion 1lb 2 x 10  Seated resisted shoulder scaption 1lb 2 x 10  Seated resisted shoulder abduction 1 lb 2 x 10  Seated resisted horizontal shoulder abduction yellow band 2 x 10  Bilateral shoulder ER yellow band 2 x 10  Seated bicep curl 2 x 10 1 lb Stability ball rollout 3 way x 2 minutes  Seated trunk rotation with med ball green 2 x 10     PATIENT EDUCATION:  Education details: HEP update Person educated: Patient Education method: Explanation, demo, cues, handout Education comprehension: verbalized understanding, returned demo, cues    HOME EXERCISE PROGRAM: Access Code: [email or web address] URL: [email or web address] Date: 10/15/2022 Prepared by: Letitia Libra  Exercises - Supine Lower Trunk Rotation  - 1 x daily - 7 x weekly - 3 sets - 10 reps - Hooklying Single Knee to Chest Stretch  - 1 x daily - 7 x weekly - 3 sets - 10 reps - Sidelying Thoracic Rotation with Open Book  - 1 x daily - 7 x weekly - 2 sets - 10 reps - Seated Hip Abduction with Resistance  - 1 x daily - 7 x weekly - 2 sets - 10 reps - Seated March with Resistance  - 1 x daily - 7 x weekly - 2 sets - 10 reps - Seated Elbow Flexion with Resistance  - 1 x daily - 7 x weekly - 2 sets - 10 reps - Seated Elbow Extension with Self-Anchored Resistance  - 1 x daily - 7 x weekly - 2 sets - 10 reps   ASSESSMENT:   CLINICAL IMPRESSION: Patient presents to PT reporting increased lower back pain and continued soreness in the L side of her rib cage. Session today focused on LE strengthening and thoracic mobility along  with diaphragmatic breathing. Re-printed HEP for patient as she had misplaced. Patient was able to tolerate all prescribed exercises with no adverse effects. Patient continues to benefit from skilled PT services and should be progressed as able to improve functional independence.     OBJECTIVE IMPAIRMENTS: Abnormal gait, decreased activity tolerance, decreased balance, decreased endurance, decreased knowledge of condition, difficulty walking, decreased ROM, decreased strength, improper body mechanics, postural dysfunction, and pain.    ACTIVITY LIMITATIONS: carrying, lifting, bending, standing, squatting, transfers, bed mobility, toileting, dressing, reach over head, hygiene/grooming, locomotion level, and caring for others   PARTICIPATION LIMITATIONS: meal prep, cleaning, laundry, driving, shopping, and community activity   PERSONAL FACTORS: Age, Fitness, Past/current experiences, Time since onset of injury/illness/exacerbation, and 3+ comorbidities: see PMH above  are also affecting patient's functional outcome.    REHAB POTENTIAL: Fair chronicity, previous PT   CLINICAL DECISION MAKING: Evolving/moderate complexity   EVALUATION COMPLEXITY: Moderate  GOALS: Goals reviewed with patient? Yes   SHORT TERM GOALS: Target date: 11/09/2022       Patient will be independent and compliant with initial HEP.    Baseline: no time to issue Goal status: INITIAL   2.  Patient will be independent with car transfers.  Baseline: supervision  Goal status: INITIAL   3.  Patient will complete at least 4 reps during 30 second sit to stand to signify improvements in functional strength.  Baseline: see above  Goal status: INITIAL   4.  Patient will demonstrate pain free trunk AROM to improve tolerance to reaching/bending activity.  Baseline: see above  Goal status: INITIAL   LONG TERM GOALS: Target date: 12/11/2022       Patient will be able to place 2 lbs in overhead shelf with minimal  shoulder pain to improve ability to reach and grab items in kitchen shelves.  Baseline: see above Goal status: INITIAL   2.  Patient will score at least 46% on FOTO to signify clinically meaningful improvement in functional abilities.    Baseline: see above Goal status: INITIAL   3.  Patient will walk at least 250 ft during 2 MWT with LRAD to improve tolerance to community ambulation.  Baseline: see above Goal status: INITIAL   4.  Patient will demonstrate at least 4/5 bilateral hip abductor strength to improve stability about the chain with walking/standing activity.  Baseline: see above Goal status: INITIAL   5.  Patient will be independent with advanced home program to assist in management of her chronic condition.  Baseline: see above  Goal status: INITIAL   PLAN:   PT FREQUENCY: 1-2x/week   PT DURATION: 8 weeks   PLANNED INTERVENTIONS: Therapeutic exercises, Therapeutic activity, Neuromuscular re-education, Balance training, Gait training, Patient/Family education, Self Care, DME instructions, Cryotherapy, Moist heat, Manual therapy, and Re-evaluation.   PLAN FOR NEXT SESSION:shoulder/hip strengthening, trunk mobility.     Berta Minor PTA 10/26/22 1:34 PM

## 2022-10-28 ENCOUNTER — Ambulatory Visit: Payer: Medicare HMO

## 2022-10-28 DIAGNOSIS — R262 Difficulty in walking, not elsewhere classified: Secondary | ICD-10-CM

## 2022-10-28 DIAGNOSIS — G8929 Other chronic pain: Secondary | ICD-10-CM

## 2022-10-28 DIAGNOSIS — M6281 Muscle weakness (generalized): Secondary | ICD-10-CM

## 2022-10-28 DIAGNOSIS — M25561 Pain in right knee: Secondary | ICD-10-CM | POA: Diagnosis not present

## 2022-10-28 DIAGNOSIS — R0781 Pleurodynia: Secondary | ICD-10-CM

## 2022-10-28 NOTE — Therapy (Signed)
OUTPATIENT PHYSICAL THERAPY TREATMENT NOTE   Patient Name: [name] MRN: [id number] DOB:1941-05-08, 82 y.o., female Today's Date: 10/28/2022  PCP: Teena Irani, PA-C  REFERRING PROVIDER: Merryl Hacker, NP   END OF SESSION:   PT End of Session - 10/28/22 1301     Visit Number 6    Number of Visits 17    Date for PT Re-Evaluation 12/11/22    Authorization Type [employer] MCR    Authorization Time Period FOTO V6, V10, kx mod v15    Progress Note Due on Visit 10    PT Start Time 1300    PT Stop Time 1340    PT Time Calculation (min) 40 min    Activity Tolerance Patient tolerated treatment well    Behavior During Therapy WFL for tasks assessed/performed               Past Medical History:  Diagnosis Date   Anemia    PMH;  Patient denies this dx as of 06/03/21   Anxiety    Asthma    Breast cancer 1988   left breast cancer   Collagenous colitis    Compression fracture    lumbar 1   Dyspnea    with exertion   Hypertension    Hypothyroidism    Osteoarthritis    Osteopenia    Pneumonia 07/2020   PONV (postoperative nausea and vomiting)    Sleep apnea    wears CPAP nightly   Past Surgical History:  Procedure Laterality Date   BREAST REDUCTION SURGERY     Right   CATARACT EXTRACTION W/ INTRAOCULAR LENS  IMPLANT, BILATERAL     CESAREAN SECTION     COLONOSCOPY W/ BIOPSIES AND POLYPECTOMY     CYST EXCISION N/A 08/30/2018   Procedure: excision of posterior shoulder / back sebaceous cyst;  Surgeon: Peggye Form, DO;  Location: [facility];  Service: Plastics;  Laterality: N/A;   KNEE ARTHROSCOPY Left 02/23/2022   Procedure: LEFT KNEE ARTHROSCOPY;  Surgeon: Marcene Corning, MD;  Location: [facility];  Service: Orthopedics;  Laterality: Left;   KYPHOPLASTY N/A 02/18/2016   Procedure: LUMBAR 1 KYPHOPLASTY;  Surgeon: Estill Bamberg, MD;  Location: [facility];  Service: Orthopedics;  Laterality: N/A;  LUMBAR 1 KYPHOPLASTY   KYPHOPLASTY N/A 06/04/2021    Procedure: LUMBAR 2 KYPHOPLASTY;  Surgeon: Estill Bamberg, MD;  Location: [facility];  Service: Orthopedics;  Laterality: N/A;   LAPAROSCOPIC APPENDECTOMY N/A 12/09/2021   Procedure: APPENDECTOMY LAPAROSCOPIC;  Surgeon: Andria Meuse, MD;  Location: [facility];  Service: General;  Laterality: N/A;   LUMBAR DISC SURGERY     MODIFIED RADICAL MASTECTOMY W/ AXILLARY LYMPH NODE DISSECTION     Left   RECONSTRUCTION BREAST W/ LATISSIMUS DORSI FLAP     left   REDUCTION MAMMAPLASTY Right    right foot surgery     corrected hammer toe, straightened big toe   SEPTOPLASTY     TOE SURGERY     TONSILLECTOMY AND ADENOIDECTOMY     Patient Active Problem List   Diagnosis Date Noted   Acute appendicitis 12/09/2021   S/P laparoscopic appendectomy 12/09/2021   CAP (community acquired pneumonia) 09/30/2020   Chronic respiratory failure with hypoxia 09/23/2020   Allergic rhinitis 08/31/2019   Pilar cyst 08/04/2018   History of breast cancer in female 06/09/2018   History of reconstruction of both breasts 06/09/2018   Severe obesity (BMI >= 40) 09/16/2015   Asthma with acute exacerbation 09/15/2015  DOE (dyspnea on exertion) 01/11/2014   Insomnia 05/05/2012   Osteoarthritis    Osteopenia    Collagenous colitis    OSA (obstructive sleep apnea)    Hypothyroidism    Mild persistent asthma    Hypertension    Breast cancer, left breast     REFERRING DIAG: M19.90 (ICD-10-CM) - Unspecified osteoarthritis, unspecified site    R07.81 (ICD-10-CM) - Rib pain   THERAPY DIAG:  Chronic pain of both knees  Chronic pain of both shoulders  Rib pain on left side  Muscle weakness (generalized)  Difficulty in walking, not elsewhere classified  Rationale for Evaluation and Treatment Rehabilitation  PERTINENT HISTORY: Hypertension  Anxiety  History of lumbar compression fracture Osteopenia  OA Kyphoplasty 2017 and 2022 Knee arthroscopy 2023   PRECAUTIONS: fall   SUBJECTIVE:                                                                                                                                                                                       SUBJECTIVE STATEMENT:  Patient reports continued soreness in ribcage, increased pain in lower back pain.   PAIN:  Are you having pain? Yes 7/10 BIL knees 3/10 Lower back  OBJECTIVE: (objective measures completed at initial evaluation unless otherwise dated)  DIAGNOSTIC FINDINGS:  No recent imaging on file    PATIENT SURVEYS:  FOTO 40% function to 46% predicted  10/28/22: 41%   SCREENING FOR RED FLAGS: Bowel or bladder incontinence: No Spinal tumors: No Cauda equina syndrome: No Compression fracture: No Abdominal aneurysm: No   COGNITION: Overall cognitive status: Within functional limits for tasks assessed                          SENSATION: Not tested   MUSCLE LENGTH: Not tested    POSTURE: slump sitting posture    PALPATION: Not assessed    LUMBAR ROM:    AROM eval  Flexion WNL  Extension 75% limited Lt rib pain  Right lateral flexion WNL Lt rib pain  Left lateral flexion WNL Lt rib pain  Right rotation WNL  Left rotation WNL Lt rib pain   (Blank rows = not tested)   LOWER EXTREMITY ROM:      Active  Right eval Left eval  Hip flexion      Hip extension      Hip abduction      Hip adduction      Hip internal rotation      Hip external rotation      Knee flexion 115 100  Knee extension      Ankle dorsiflexion  Ankle plantarflexion      Ankle inversion      Ankle eversion       (Blank rows = not tested)   LOWER EXTREMITY MMT:     MMT Right eval Left eval  Hip flexion 4 4  Hip extension      Hip abduction 3+ 3+  Hip adduction      Hip internal rotation      Hip external rotation      Knee flexion 5 5  Knee extension 5 5  Ankle dorsiflexion      Ankle plantarflexion      Ankle inversion      Ankle eversion       (Blank rows = not tested) UPPER EXTREMITY MMT:   MMT  Right eval Left eval  Shoulder flexion 4- 4-  Shoulder extension      Shoulder abduction 5 5  Shoulder adduction      Shoulder extension      Shoulder internal rotation 5 5  Shoulder external rotation 5 5  Middle trapezius      Lower trapezius      Elbow flexion      Elbow extension      Wrist flexion      Wrist extension      Wrist ulnar deviation      Wrist radial deviation      Wrist pronation      Wrist supination      Grip strength       (Blank rows = not tested) UPPER EXTREMITY ROM:   Active ROM Right eval Left eval  Shoulder flexion [facility]  Shoulder extension      Shoulder abduction [facility]  Shoulder adduction      Shoulder extension      Shoulder internal rotation L5 L5  Shoulder external rotation C5 C5  Elbow flexion      Elbow extension      Wrist flexion      Wrist extension      Wrist ulnar deviation      Wrist radial deviation      Wrist pronation      Wrist supination       (Blank rows = not tested)   SPECIAL TESTS:  Not tested    FUNCTIONAL TESTS:  30 second sit to stand: 2 reps, BUE support    2 MWT: 200 ft with rollator   10/19/22: 2 MWT: 200 ft with rollator   GAIT: Distance walked: 10 ft  Assistive device utilized: Environmental consultant - 4 wheeled Level of assistance: Modified independence Comments: excessive frontal plane movement, decreased step length, limited push-off   TREATMENT [facility] Adult PT Treatment:                                                DATE: 10/28/22 Therapeutic Exercise: NuStep level 5 x 5 minutes UE/LE  LAQ 2 x 10; 2 lb  HS curl green band 2 x 10  Seated march 3 x 30"; 2 lb  Seated clamshells 2 x 15 Seated marching GTB 2x30" STS x10   [facility] Adult PT Treatment:  DATE: 10/26/22 Therapeutic Exercise: NuStep level 5 x 5 minutes UE/LE  Seated horizontal abduction RTB 2x10 Seated scaption no weight 2x10 LAQ 2 x 10; 2 lb  HS curl green band 2 x 10  Seated march 2 x 30"; 2 lb   Clamshells 2 x 10  Supine chest press 2# with dowel x10 Supine TA march GTB 2 x 10  Supine shoulder flexion/extension with dowel and focus on inhale/exhale diaphragmatic breathing x10   [facility] Adult PT Treatment:                                                DATE: 10/19/22 Therapeutic Exercise: 2 minute walk  LAQ 2 x 10; 1 lb  HS curl green band 2 x 10  Seated march 2 x 10; 1 lb  Clamshells 2 x 10  Supine TA march 2 x 10  Supine diaphragmatic breathing x 10  Seated thoracic extension x 10  Standing calf raise 2 x 10  Updated HEP     PATIENT EDUCATION:  Education details: HEP update Person educated: Patient Education method: Explanation, demo, cues, handout Education comprehension: verbalized understanding, returned demo, cues    HOME EXERCISE PROGRAM: Access Code: [email or web address] URL: [email or web address] Date: 10/15/2022 Prepared by: Letitia Libra  Exercises - Supine Lower Trunk Rotation  - 1 x daily - 7 x weekly - 3 sets - 10 reps - Hooklying Single Knee to Chest Stretch  - 1 x daily - 7 x weekly - 3 sets - 10 reps - Sidelying Thoracic Rotation with Open Book  - 1 x daily - 7 x weekly - 2 sets - 10 reps - Seated Hip Abduction with Resistance  - 1 x daily - 7 x weekly - 2 sets - 10 reps - Seated March with Resistance  - 1 x daily - 7 x weekly - 2 sets - 10 reps - Seated Elbow Flexion with Resistance  - 1 x daily - 7 x weekly - 2 sets - 10 reps - Seated Elbow Extension with Self-Anchored Resistance  - 1 x daily - 7 x weekly - 2 sets - 10 reps   ASSESSMENT:   CLINICAL IMPRESSION: Patient presents to PT reporting increased pain in BIL knees today and continued, though lessened, lower back pain. Re-administered FOTO this session with patient making minimal progress towards goal. Session today focused on LE strengthening, most difficult being STS which increased pain in the L knee. Patient was able to tolerate all prescribed exercises with no adverse effects. Patient  continues to benefit from skilled PT services and should be progressed as able to improve functional independence.     OBJECTIVE IMPAIRMENTS: Abnormal gait, decreased activity tolerance, decreased balance, decreased endurance, decreased knowledge of condition, difficulty walking, decreased ROM, decreased strength, improper body mechanics, postural dysfunction, and pain.    ACTIVITY LIMITATIONS: carrying, lifting, bending, standing, squatting, transfers, bed mobility, toileting, dressing, reach over head, hygiene/grooming, locomotion level, and caring for others   PARTICIPATION LIMITATIONS: meal prep, cleaning, laundry, driving, shopping, and community activity   PERSONAL FACTORS: Age, Fitness, Past/current experiences, Time since onset of injury/illness/exacerbation, and 3+ comorbidities: see PMH above  are also affecting patient's functional outcome.    REHAB POTENTIAL: Fair chronicity, previous PT   CLINICAL DECISION MAKING: Evolving/moderate complexity   EVALUATION COMPLEXITY: Moderate     GOALS: Goals reviewed with patient? Yes  SHORT TERM GOALS: Target date: 11/09/2022      Patient will be independent and compliant with initial HEP.    Baseline: no time to issue Goal status: INITIAL   2.  Patient will be independent with car transfers.  Baseline: supervision  Goal status: INITIAL   3.  Patient will complete at least 4 reps during 30 second sit to stand to signify improvements in functional strength.  Baseline: see above  Goal status: INITIAL   4.  Patient will demonstrate pain free trunk AROM to improve tolerance to reaching/bending activity.  Baseline: see above  Goal status: INITIAL   LONG TERM GOALS: Target date: 12/11/2022       Patient will be able to place 2 lbs in overhead shelf with minimal shoulder pain to improve ability to reach and grab items in kitchen shelves.  Baseline: see above Goal status: INITIAL   2.  Patient will score at least 46% on FOTO to  signify clinically meaningful improvement in functional abilities.    Baseline: see above Goal status: INITIAL   3.  Patient will walk at least 250 ft during 2 MWT with LRAD to improve tolerance to community ambulation.  Baseline: see above Goal status: INITIAL   4.  Patient will demonstrate at least 4/5 bilateral hip abductor strength to improve stability about the chain with walking/standing activity.  Baseline: see above Goal status: INITIAL   5.  Patient will be independent with advanced home program to assist in management of her chronic condition.  Baseline: see above  Goal status: INITIAL   PLAN:   PT FREQUENCY: 1-2x/week   PT DURATION: 8 weeks   PLANNED INTERVENTIONS: Therapeutic exercises, Therapeutic activity, Neuromuscular re-education, Balance training, Gait training, Patient/Family education, Self Care, DME instructions, Cryotherapy, Moist heat, Manual therapy, and Re-evaluation.   PLAN FOR NEXT SESSION:shoulder/hip strengthening, trunk mobility.     Berta Minor PTA 10/28/22 1:44 PM

## 2022-11-01 NOTE — Progress Notes (Unsigned)
Averages  January  2024 summary Average Weight Level 234.11 lbs Lowest Weight Level 230.6 lbs Highest Weight Level 238.6 lbs  Feb 2024 Weight lb -- 235.4 (230.4 - 238.8)  Mar 2024 Weight lb -- 235.3 (232.6 - 238.8)  April 2024 Weight lb -- 231.8 (228.6 - 234.8)

## 2022-11-02 ENCOUNTER — Ambulatory Visit: Payer: Medicare HMO

## 2022-11-02 DIAGNOSIS — R262 Difficulty in walking, not elsewhere classified: Secondary | ICD-10-CM

## 2022-11-02 DIAGNOSIS — M6281 Muscle weakness (generalized): Secondary | ICD-10-CM

## 2022-11-02 DIAGNOSIS — R0781 Pleurodynia: Secondary | ICD-10-CM

## 2022-11-02 DIAGNOSIS — G8929 Other chronic pain: Secondary | ICD-10-CM

## 2022-11-02 DIAGNOSIS — M25561 Pain in right knee: Secondary | ICD-10-CM | POA: Diagnosis not present

## 2022-11-02 NOTE — Therapy (Signed)
OUTPATIENT PHYSICAL THERAPY TREATMENT NOTE   Patient Name: [name] MRN: [id number] DOB:[birth year], 82 y.o., female Today's Date: 11/02/2022  PCP: Teena Irani, PA-C  REFERRING PROVIDER: Merryl Hacker, NP   END OF SESSION:   PT End of Session - 11/02/22 1147     Visit Number 7    Number of Visits 17    Date for PT Re-Evaluation 12/11/22    Authorization Type [employer] MCR    Authorization Time Period FOTO V6, V10, kx mod v15    Progress Note Due on Visit 10    PT Start Time 1148    PT Stop Time 1227    PT Time Calculation (min) 39 min    Activity Tolerance Patient tolerated treatment well    Behavior During Therapy WFL for tasks assessed/performed                Past Medical History:  Diagnosis Date   Anemia    PMH;  Patient denies this dx as of 06/03/21   Anxiety    Asthma    Breast cancer (HCC) 1988   left breast cancer   Collagenous colitis    Compression fracture    lumbar 1   Dyspnea    with exertion   Hypertension    Hypothyroidism    Osteoarthritis    Osteopenia    Pneumonia 07/2020   PONV (postoperative nausea and vomiting)    Sleep apnea    wears CPAP nightly   Past Surgical History:  Procedure Laterality Date   BREAST REDUCTION SURGERY     Right   CATARACT EXTRACTION W/ INTRAOCULAR LENS  IMPLANT, BILATERAL     CESAREAN SECTION     COLONOSCOPY W/ BIOPSIES AND POLYPECTOMY     CYST EXCISION N/A 08/30/2018   Procedure: excision of posterior shoulder / back sebaceous cyst;  Surgeon: Peggye Form, DO;  Location: [facility];  Service: Plastics;  Laterality: N/A;   KNEE ARTHROSCOPY Left 02/23/2022   Procedure: LEFT KNEE ARTHROSCOPY;  Surgeon: Marcene Corning, MD;  Location: [facility];  Service: Orthopedics;  Laterality: Left;   KYPHOPLASTY N/A 02/18/2016   Procedure: LUMBAR 1 KYPHOPLASTY;  Surgeon: Estill Bamberg, MD;  Location: [facility];  Service: Orthopedics;  Laterality: N/A;  LUMBAR 1 KYPHOPLASTY   KYPHOPLASTY N/A  06/04/2021   Procedure: LUMBAR 2 KYPHOPLASTY;  Surgeon: Estill Bamberg, MD;  Location: [facility];  Service: Orthopedics;  Laterality: N/A;   LAPAROSCOPIC APPENDECTOMY N/A 12/09/2021   Procedure: APPENDECTOMY LAPAROSCOPIC;  Surgeon: Andria Meuse, MD;  Location: [facility];  Service: General;  Laterality: N/A;   LUMBAR DISC SURGERY     MODIFIED RADICAL MASTECTOMY W/ AXILLARY LYMPH NODE DISSECTION     Left   RECONSTRUCTION BREAST W/ LATISSIMUS DORSI FLAP     left   REDUCTION MAMMAPLASTY Right    right foot surgery     corrected hammer toe, straightened big toe   SEPTOPLASTY     TOE SURGERY     TONSILLECTOMY AND ADENOIDECTOMY     Patient Active Problem List   Diagnosis Date Noted   Acute appendicitis 12/09/2021   S/P laparoscopic appendectomy 12/09/2021   CAP (community acquired pneumonia) 09/30/2020   Chronic respiratory failure with hypoxia (HCC) 09/23/2020   Allergic rhinitis 08/31/2019   Pilar cyst 08/04/2018   History of breast cancer in female 06/09/2018   History of reconstruction of both breasts 06/09/2018   Severe obesity (BMI >= 40) (HCC) 09/16/2015   Asthma with acute  exacerbation 09/15/2015   DOE (dyspnea on exertion) 01/11/2014   Insomnia 05/05/2012   Osteoarthritis    Osteopenia    Collagenous colitis    OSA (obstructive sleep apnea)    Hypothyroidism    Mild persistent asthma    Hypertension    Breast cancer, left breast (HCC)     REFERRING DIAG: M19.90 (ICD-10-CM) - Unspecified osteoarthritis, unspecified site    R07.81 (ICD-10-CM) - Rib pain   THERAPY DIAG:  Chronic pain of both knees  Chronic pain of both shoulders  Rib pain on left side  Muscle weakness (generalized)  Difficulty in walking, not elsewhere classified  Rationale for Evaluation and Treatment Rehabilitation  PERTINENT HISTORY: Hypertension  Anxiety  History of lumbar compression fracture Osteopenia  OA Kyphoplasty 2017 and 2022 Knee arthroscopy 2023   PRECAUTIONS: fall    SUBJECTIVE:                                                                                                                                                                                      SUBJECTIVE STATEMENT:  Patient reports she is feeling "miserable" and is awaiting an injection in her Lt knee. No pain currently. Does still notice rib pain in the morning before getting out of bed.    PAIN:  Are you having pain? None currently; Yes: NPRS scale: 9 at worst (Lt ribcage); 9 (bilateral knee/shoulders) /10 Pain location: Lt ribcage, bilateral knees, bilateral shoulders  Pain description: sharp, hot, knife  Aggravating factors: rotating, changing positions, walking, transfers  Relieving factors: rest  OBJECTIVE: (objective measures completed at initial evaluation unless otherwise dated)  DIAGNOSTIC FINDINGS:  No recent imaging on file    PATIENT SURVEYS:  FOTO 40% function to 46% predicted  10/28/22: 41%   SCREENING FOR RED FLAGS: Bowel or bladder incontinence: No Spinal tumors: No Cauda equina syndrome: No Compression fracture: No Abdominal aneurysm: No   COGNITION: Overall cognitive status: Within functional limits for tasks assessed                          SENSATION: Not tested   MUSCLE LENGTH: Not tested    POSTURE: slump sitting posture    PALPATION: Not assessed    LUMBAR ROM:    AROM eval  Flexion WNL  Extension 75% limited Lt rib pain  Right lateral flexion WNL Lt rib pain  Left lateral flexion WNL Lt rib pain  Right rotation WNL  Left rotation WNL Lt rib pain   (Blank rows = not tested)   LOWER EXTREMITY ROM:      Active  Right eval Left eval  Hip flexion  Hip extension      Hip abduction      Hip adduction      Hip internal rotation      Hip external rotation      Knee flexion 115 100  Knee extension      Ankle dorsiflexion      Ankle plantarflexion      Ankle inversion      Ankle eversion       (Blank rows = not tested)    LOWER EXTREMITY MMT:     MMT Right eval Left eval  Hip flexion 4 4  Hip extension      Hip abduction 3+ 3+  Hip adduction      Hip internal rotation      Hip external rotation      Knee flexion 5 5  Knee extension 5 5  Ankle dorsiflexion      Ankle plantarflexion      Ankle inversion      Ankle eversion       (Blank rows = not tested) UPPER EXTREMITY MMT:   MMT Right eval Left eval  Shoulder flexion 4- 4-  Shoulder extension      Shoulder abduction 5 5  Shoulder adduction      Shoulder extension      Shoulder internal rotation 5 5  Shoulder external rotation 5 5  Middle trapezius      Lower trapezius      Elbow flexion      Elbow extension      Wrist flexion      Wrist extension      Wrist ulnar deviation      Wrist radial deviation      Wrist pronation      Wrist supination      Grip strength       (Blank rows = not tested) UPPER EXTREMITY ROM:   Active ROM Right eval Left eval  Shoulder flexion [facility]  Shoulder extension      Shoulder abduction [facility]  Shoulder adduction      Shoulder extension      Shoulder internal rotation L5 L5  Shoulder external rotation C5 C5  Elbow flexion      Elbow extension      Wrist flexion      Wrist extension      Wrist ulnar deviation      Wrist radial deviation      Wrist pronation      Wrist supination       (Blank rows = not tested)   SPECIAL TESTS:  Not tested    FUNCTIONAL TESTS:  30 second sit to stand: 2 reps, BUE support    2 MWT: 200 ft with rollator   10/19/22: 2 MWT: 200 ft with rollator   GAIT: Distance walked: 10 ft  Assistive device utilized: Environmental consultant - 4 wheeled Level of assistance: Modified independence Comments: excessive frontal plane movement, decreased step length, limited push-off   TREATMENT [facility] Adult PT Treatment:                                                DATE: 11/02/22 Therapeutic Exercise: NuStep level 5 x 5 minutes UE/LE LTR x1 minute Sidelying thoracic rotation x 10   Clamshells 2 x 10; 2# Counter stretch 3 x 10 sec  Standing hip  abduction 2 x 10  Standing hip extension 2 x 10  Standing calf raises 2 x 10  Seated stability ball rollout x 1 minute   [facility] Adult PT Treatment:                                                DATE: 10/28/22 Therapeutic Exercise: NuStep level 5 x 5 minutes UE/LE  LAQ 2 x 10; 2 lb  HS curl green band 2 x 10  Seated march 3 x 30"; 2 lb  Seated clamshells 2 x 15 Seated marching GTB 2x30" STS x10   [facility] Adult PT Treatment:                                                DATE: 10/26/22 Therapeutic Exercise: NuStep level 5 x 5 minutes UE/LE  Seated horizontal abduction RTB 2x10 Seated scaption no weight 2x10 LAQ 2 x 10; 2 lb  HS curl green band 2 x 10  Seated march 2 x 30"; 2 lb  Clamshells 2 x 10  Supine chest press 2# with dowel x10 Supine TA march GTB 2 x 10  Supine shoulder flexion/extension with dowel and focus on inhale/exhale diaphragmatic breathing x10    PATIENT EDUCATION:  Education details: HEP review Person educated: Patient Education method: Explanation Education comprehension: verbalized understanding   HOME EXERCISE PROGRAM: Access Code: [email or web address] URL: [email or web address] Date: 10/15/2022 Prepared by: Letitia Libra  Exercises - Supine Lower Trunk Rotation  - 1 x daily - 7 x weekly - 3 sets - 10 reps - Hooklying Single Knee to Chest Stretch  - 1 x daily - 7 x weekly - 3 sets - 10 reps - Sidelying Thoracic Rotation with Open Book  - 1 x daily - 7 x weekly - 2 sets - 10 reps - Seated Hip Abduction with Resistance  - 1 x daily - 7 x weekly - 2 sets - 10 reps - Seated March with Resistance  - 1 x daily - 7 x weekly - 2 sets - 10 reps - Seated Elbow Flexion with Resistance  - 1 x daily - 7 x weekly - 2 sets - 10 reps - Seated Elbow Extension with Self-Anchored Resistance  - 1 x daily - 7 x weekly - 2 sets - 10 reps   ASSESSMENT:   CLINICAL IMPRESSION: Patient arrives without  reports of pain, but reports overall worsening of knee pain and ongoing ribcage pain. Worked on trunk mobility and LE strengthening today with good tolerance. Able to introduce standing strengthening today with patient having poor standing tolerance requiring frequent seated rest break due to fatigue and feelings of Lt knee "pressure." No complaints of pain at conclusion of session.     OBJECTIVE IMPAIRMENTS: Abnormal gait, decreased activity tolerance, decreased balance, decreased endurance, decreased knowledge of condition, difficulty walking, decreased ROM, decreased strength, improper body mechanics, postural dysfunction, and pain.    ACTIVITY LIMITATIONS: carrying, lifting, bending, standing, squatting, transfers, bed mobility, toileting, dressing, reach over head, hygiene/grooming, locomotion level, and caring for others   PARTICIPATION LIMITATIONS: meal prep, cleaning, laundry, driving, shopping, and community activity   PERSONAL FACTORS: Age, Fitness, Past/current experiences, Time since onset of injury/illness/exacerbation, and 3+ comorbidities: see PMH above  are also affecting patient's functional outcome.    REHAB POTENTIAL: Fair chronicity, previous PT   CLINICAL DECISION MAKING: Evolving/moderate complexity   EVALUATION COMPLEXITY: Moderate     GOALS: Goals reviewed with patient? Yes   SHORT TERM GOALS: Target date: 11/09/2022      Patient will be independent and compliant with initial HEP.    Baseline: no time to issue Goal status: met   2.  Patient will be independent with car transfers.  Baseline: supervision  11/02/22: able to transfer independently Goal status: met   3.  Patient will complete at least 4 reps during 30 second sit to stand to signify improvements in functional strength.  Baseline: see above  Goal status: INITIAL   4.  Patient will demonstrate pain free trunk AROM to improve tolerance to reaching/bending activity.  Baseline: see above  Goal  status: INITIAL   LONG TERM GOALS: Target date: 12/11/2022       Patient will be able to place 2 lbs in overhead shelf with minimal shoulder pain to improve ability to reach and grab items in kitchen shelves.  Baseline: see above Goal status: INITIAL   2.  Patient will score at least 46% on FOTO to signify clinically meaningful improvement in functional abilities.    Baseline: see above Goal status: INITIAL   3.  Patient will walk at least 250 ft during 2 MWT with LRAD to improve tolerance to community ambulation.  Baseline: see above Goal status: INITIAL   4.  Patient will demonstrate at least 4/5 bilateral hip abductor strength to improve stability about the chain with walking/standing activity.  Baseline: see above Goal status: INITIAL   5.  Patient will be independent with advanced home program to assist in management of her chronic condition.  Baseline: see above  Goal status: INITIAL   PLAN:   PT FREQUENCY: 1-2x/week   PT DURATION: 8 weeks   PLANNED INTERVENTIONS: Therapeutic exercises, Therapeutic activity, Neuromuscular re-education, Balance training, Gait training, Patient/Family education, Self Care, DME instructions, Cryotherapy, Moist heat, Manual therapy, and Re-evaluation.   PLAN FOR NEXT SESSION:shoulder/hip strengthening, trunk mobility.    Letitia Libra, PT, DPT, ATC 11/02/22 12:29 PM

## 2022-11-04 ENCOUNTER — Ambulatory Visit: Payer: Medicare HMO | Attending: Orthopaedic Surgery

## 2022-11-04 DIAGNOSIS — R2689 Other abnormalities of gait and mobility: Secondary | ICD-10-CM | POA: Insufficient documentation

## 2022-11-04 DIAGNOSIS — R0781 Pleurodynia: Secondary | ICD-10-CM

## 2022-11-04 DIAGNOSIS — R262 Difficulty in walking, not elsewhere classified: Secondary | ICD-10-CM | POA: Diagnosis present

## 2022-11-04 DIAGNOSIS — M25561 Pain in right knee: Secondary | ICD-10-CM | POA: Diagnosis present

## 2022-11-04 DIAGNOSIS — M25511 Pain in right shoulder: Secondary | ICD-10-CM | POA: Insufficient documentation

## 2022-11-04 DIAGNOSIS — M25512 Pain in left shoulder: Secondary | ICD-10-CM | POA: Insufficient documentation

## 2022-11-04 DIAGNOSIS — M25562 Pain in left knee: Secondary | ICD-10-CM | POA: Insufficient documentation

## 2022-11-04 DIAGNOSIS — M6281 Muscle weakness (generalized): Secondary | ICD-10-CM | POA: Diagnosis present

## 2022-11-04 DIAGNOSIS — G8929 Other chronic pain: Secondary | ICD-10-CM | POA: Diagnosis present

## 2022-11-04 NOTE — Therapy (Signed)
OUTPATIENT PHYSICAL THERAPY TREATMENT NOTE   Patient Name: [name] MRN: [id number] DOB:1940/12/18, 82 y.o., female Today's Date: 11/04/2022  PCP: Teena Irani, PA-C  REFERRING PROVIDER: Merryl Hacker, NP   END OF SESSION:   PT End of Session - 11/04/22 1146     Visit Number 8    Number of Visits 17    Date for PT Re-Evaluation 12/11/22    Authorization Type [employer] MCR    Authorization Time Period FOTO V6, V10, kx mod v15    Progress Note Due on Visit 10    PT Start Time 1146    PT Stop Time 1229    PT Time Calculation (min) 43 min    Activity Tolerance Patient tolerated treatment well    Behavior During Therapy WFL for tasks assessed/performed                Past Medical History:  Diagnosis Date   Anemia    PMH;  Patient denies this dx as of 06/03/21   Anxiety    Asthma    Breast cancer (HCC) 1988   left breast cancer   Collagenous colitis    Compression fracture    lumbar 1   Dyspnea    with exertion   Hypertension    Hypothyroidism    Osteoarthritis    Osteopenia    Pneumonia 07/2020   PONV (postoperative nausea and vomiting)    Sleep apnea    wears CPAP nightly   Past Surgical History:  Procedure Laterality Date   BREAST REDUCTION SURGERY     Right   CATARACT EXTRACTION W/ INTRAOCULAR LENS  IMPLANT, BILATERAL     CESAREAN SECTION     COLONOSCOPY W/ BIOPSIES AND POLYPECTOMY     CYST EXCISION N/A 08/30/2018   Procedure: excision of posterior shoulder / back sebaceous cyst;  Surgeon: Peggye Form, DO;  Location: [facility];  Service: Plastics;  Laterality: N/A;   KNEE ARTHROSCOPY Left 02/23/2022   Procedure: LEFT KNEE ARTHROSCOPY;  Surgeon: Marcene Corning, MD;  Location: [facility];  Service: Orthopedics;  Laterality: Left;   KYPHOPLASTY N/A 02/18/2016   Procedure: LUMBAR 1 KYPHOPLASTY;  Surgeon: Estill Bamberg, MD;  Location: [facility];  Service: Orthopedics;  Laterality: N/A;  LUMBAR 1 KYPHOPLASTY   KYPHOPLASTY N/A  06/04/2021   Procedure: LUMBAR 2 KYPHOPLASTY;  Surgeon: Estill Bamberg, MD;  Location: [facility];  Service: Orthopedics;  Laterality: N/A;   LAPAROSCOPIC APPENDECTOMY N/A 12/09/2021   Procedure: APPENDECTOMY LAPAROSCOPIC;  Surgeon: Andria Meuse, MD;  Location: [facility];  Service: General;  Laterality: N/A;   LUMBAR DISC SURGERY     MODIFIED RADICAL MASTECTOMY W/ AXILLARY LYMPH NODE DISSECTION     Left   RECONSTRUCTION BREAST W/ LATISSIMUS DORSI FLAP     left   REDUCTION MAMMAPLASTY Right    right foot surgery     corrected hammer toe, straightened big toe   SEPTOPLASTY     TOE SURGERY     TONSILLECTOMY AND ADENOIDECTOMY     Patient Active Problem List   Diagnosis Date Noted   Acute appendicitis 12/09/2021   S/P laparoscopic appendectomy 12/09/2021   CAP (community acquired pneumonia) 09/30/2020   Chronic respiratory failure with hypoxia (HCC) 09/23/2020   Allergic rhinitis 08/31/2019   Pilar cyst 08/04/2018   History of breast cancer in female 06/09/2018   History of reconstruction of both breasts 06/09/2018   Severe obesity (BMI >= 40) (HCC) 09/16/2015   Asthma with acute  exacerbation 09/15/2015   DOE (dyspnea on exertion) 01/11/2014   Insomnia 05/05/2012   Osteoarthritis    Osteopenia    Collagenous colitis    OSA (obstructive sleep apnea)    Hypothyroidism    Mild persistent asthma    Hypertension    Breast cancer, left breast (HCC)     REFERRING DIAG: M19.90 (ICD-10-CM) - Unspecified osteoarthritis, unspecified site    R07.81 (ICD-10-CM) - Rib pain   THERAPY DIAG:  Chronic pain of both knees  Chronic pain of both shoulders  Rib pain on left side  Muscle weakness (generalized)  Difficulty in walking, not elsewhere classified  Rationale for Evaluation and Treatment Rehabilitation  PERTINENT HISTORY: Hypertension  Anxiety  History of lumbar compression fracture Osteopenia  OA Kyphoplasty 2017 and 2022 Knee arthroscopy 2023   PRECAUTIONS: fall    SUBJECTIVE:                                                                                                                                                                                      SUBJECTIVE STATEMENT:  "I have some good news. Some progress. Wednesday morning I turned over on my Rt side and could be there for a few minutes. Last night I laid on my side for 10 minutes."    PAIN:  Are you having pain? None currently; Yes: NPRS scale: 5 at worst (Lt ribcage); 3 shoulders; 7 knees /10 Pain location: Lt ribcage, bilateral knees, bilateral shoulders  Pain description: sharp, hot, knife  Aggravating factors: rotating, changing positions, walking, transfers  Relieving factors: rest  OBJECTIVE: (objective measures completed at initial evaluation unless otherwise dated)  DIAGNOSTIC FINDINGS:  No recent imaging on file    PATIENT SURVEYS:  FOTO 40% function to 46% predicted  10/28/22: 41%   SCREENING FOR RED FLAGS: Bowel or bladder incontinence: No Spinal tumors: No Cauda equina syndrome: No Compression fracture: No Abdominal aneurysm: No   COGNITION: Overall cognitive status: Within functional limits for tasks assessed                          SENSATION: Not tested   MUSCLE LENGTH: Not tested    POSTURE: slump sitting posture    PALPATION: Not assessed    LUMBAR ROM:    AROM eval  Flexion WNL  Extension 75% limited Lt rib pain  Right lateral flexion WNL Lt rib pain  Left lateral flexion WNL Lt rib pain  Right rotation WNL  Left rotation WNL Lt rib pain   (Blank rows = not tested)   LOWER EXTREMITY ROM:      Active  Right eval Left  eval  Hip flexion      Hip extension      Hip abduction      Hip adduction      Hip internal rotation      Hip external rotation      Knee flexion 115 100  Knee extension      Ankle dorsiflexion      Ankle plantarflexion      Ankle inversion      Ankle eversion       (Blank rows = not tested)   LOWER EXTREMITY  MMT:     MMT Right eval Left eval  Hip flexion 4 4  Hip extension      Hip abduction 3+ 3+  Hip adduction      Hip internal rotation      Hip external rotation      Knee flexion 5 5  Knee extension 5 5  Ankle dorsiflexion      Ankle plantarflexion      Ankle inversion      Ankle eversion       (Blank rows = not tested) UPPER EXTREMITY MMT:   MMT Right eval Left eval  Shoulder flexion 4- 4-  Shoulder extension      Shoulder abduction 5 5  Shoulder adduction      Shoulder extension      Shoulder internal rotation 5 5  Shoulder external rotation 5 5  Middle trapezius      Lower trapezius      Elbow flexion      Elbow extension      Wrist flexion      Wrist extension      Wrist ulnar deviation      Wrist radial deviation      Wrist pronation      Wrist supination      Grip strength       (Blank rows = not tested) UPPER EXTREMITY ROM:   Active ROM Right eval Left eval  Shoulder flexion [facility]  Shoulder extension      Shoulder abduction [facility]  Shoulder adduction      Shoulder extension      Shoulder internal rotation L5 L5  Shoulder external rotation C5 C5  Elbow flexion      Elbow extension      Wrist flexion      Wrist extension      Wrist ulnar deviation      Wrist radial deviation      Wrist pronation      Wrist supination       (Blank rows = not tested)   SPECIAL TESTS:  Not tested    FUNCTIONAL TESTS:  30 second sit to stand: 2 reps, BUE support    2 MWT: 200 ft with rollator   10/19/22: 2 MWT: 200 ft with rollator  11/04/22: 30 second sit to stand: 7 reps; UE support for first rep   GAIT: Distance walked: 10 ft  Assistive device utilized: Walker - 4 wheeled Level of assistance: Modified independence Comments: excessive frontal plane movement, decreased step length, limited push-off   TREATMENT [facility] Adult PT Treatment:                                                DATE: 11/04/22 Therapeutic Exercise: NuStep level 5 x 5 minutes  LAQ  2 x 10 @ 2.5 lbs  Standing hip abduction 2 x 10  Standing hip extension 2 x 10  Standing hamstring curl 2 x 10  Standing march 2 x 10  Standing calf raise 2 x 10  Updated HEP     [facility] Adult PT Treatment:                                                DATE: 11/02/22 Therapeutic Exercise: NuStep level 5 x 5 minutes UE/LE LTR x1 minute Sidelying thoracic rotation x 10  Clamshells 2 x 10; 2# Counter stretch 3 x 10 sec  Standing hip abduction 2 x 10  Standing hip extension 2 x 10  Standing calf raises 2 x 10  Seated stability ball rollout x 1 minute   [facility] Adult PT Treatment:                                                DATE: 10/28/22 Therapeutic Exercise: NuStep level 5 x 5 minutes UE/LE  LAQ 2 x 10; 2 lb  HS curl green band 2 x 10  Seated march 3 x 30"; 2 lb  Seated clamshells 2 x 15 Seated marching GTB 2x30" STS x10   [facility] Adult PT Treatment:                                                DATE: 10/26/22 Therapeutic Exercise: NuStep level 5 x 5 minutes UE/LE  Seated horizontal abduction RTB 2x10 Seated scaption no weight 2x10 LAQ 2 x 10; 2 lb  HS curl green band 2 x 10  Seated march 2 x 30"; 2 lb  Clamshells 2 x 10  Supine chest press 2# with dowel x10 Supine TA march GTB 2 x 10  Supine shoulder flexion/extension with dowel and focus on inhale/exhale diaphragmatic breathing x10    PATIENT EDUCATION:  Education details: HEP update Person educated: Patient Education method: Explanation, demo, cues, handout Education comprehension: verbalized understanding, returned demo, cues    HOME EXERCISE PROGRAM: Access Code: [email or web address] URL: [email or web address] Date: 10/15/2022 Prepared by: Letitia Libra  Exercises - Supine Lower Trunk Rotation  - 1 x daily - 7 x weekly - 3 sets - 10 reps - Hooklying Single Knee to Chest Stretch  - 1 x daily - 7 x weekly - 3 sets - 10 reps - Sidelying Thoracic Rotation with Open Book  - 1 x daily - 7 x weekly - 2 sets - 10  reps - Seated Hip Abduction with Resistance  - 1 x daily - 7 x weekly - 2 sets - 10 reps - Seated March with Resistance  - 1 x daily - 7 x weekly - 2 sets - 10 reps - Seated Elbow Flexion with Resistance  - 1 x daily - 7 x weekly - 2 sets - 10 reps - Seated Elbow Extension with Self-Anchored Resistance  - 1 x daily - 7 x weekly - 2 sets - 10 reps   ASSESSMENT:   CLINICAL IMPRESSION: Patient arrives without reports of pain. Her 30 second sit to stand  has significant improved having met this STG. Patient requested to focus on the knees today so worked on progression of hip/knee strengthening. Able to progress standing strengthening with improved tolerance requiring less frequent seated rest breaks compared to last session. No reports of pain at conclusion of session. HEP was updated to include further standing strengthening.     OBJECTIVE IMPAIRMENTS: Abnormal gait, decreased activity tolerance, decreased balance, decreased endurance, decreased knowledge of condition, difficulty walking, decreased ROM, decreased strength, improper body mechanics, postural dysfunction, and pain.    ACTIVITY LIMITATIONS: carrying, lifting, bending, standing, squatting, transfers, bed mobility, toileting, dressing, reach over head, hygiene/grooming, locomotion level, and caring for others   PARTICIPATION LIMITATIONS: meal prep, cleaning, laundry, driving, shopping, and community activity   PERSONAL FACTORS: Age, Fitness, Past/current experiences, Time since onset of injury/illness/exacerbation, and 3+ comorbidities: see PMH above  are also affecting patient's functional outcome.    REHAB POTENTIAL: Fair chronicity, previous PT   CLINICAL DECISION MAKING: Evolving/moderate complexity   EVALUATION COMPLEXITY: Moderate     GOALS: Goals reviewed with patient? Yes   SHORT TERM GOALS: Target date: 11/09/2022      Patient will be independent and compliant with initial HEP.    Baseline: no time to issue Goal  status: met   2.  Patient will be independent with car transfers.  Baseline: supervision  11/02/22: able to transfer independently Goal status: met   3.  Patient will complete at least 4 reps during 30 second sit to stand to signify improvements in functional strength.  Baseline: see above  Goal status: met   4.  Patient will demonstrate pain free trunk AROM to improve tolerance to reaching/bending activity.  Baseline: see above  Goal status: INITIAL   LONG TERM GOALS: Target date: 12/11/2022       Patient will be able to place 2 lbs in overhead shelf with minimal shoulder pain to improve ability to reach and grab items in kitchen shelves.  Baseline: see above Goal status: INITIAL   2.  Patient will score at least 46% on FOTO to signify clinically meaningful improvement in functional abilities.    Baseline: see above Goal status: INITIAL   3.  Patient will walk at least 250 ft during 2 MWT with LRAD to improve tolerance to community ambulation.  Baseline: see above Goal status: INITIAL   4.  Patient will demonstrate at least 4/5 bilateral hip abductor strength to improve stability about the chain with walking/standing activity.  Baseline: see above Goal status: INITIAL   5.  Patient will be independent with advanced home program to assist in management of her chronic condition.  Baseline: see above  Goal status: INITIAL   PLAN:   PT FREQUENCY: 1-2x/week   PT DURATION: 8 weeks   PLANNED INTERVENTIONS: Therapeutic exercises, Therapeutic activity, Neuromuscular re-education, Balance training, Gait training, Patient/Family education, Self Care, DME instructions, Cryotherapy, Moist heat, Manual therapy, and Re-evaluation.   PLAN FOR NEXT SESSION:shoulder/hip strengthening, trunk mobility.    Letitia Libra, PT, DPT, ATC 11/04/22 12:30 PM

## 2022-11-16 ENCOUNTER — Ambulatory Visit: Payer: Medicare HMO

## 2022-11-16 DIAGNOSIS — R0781 Pleurodynia: Secondary | ICD-10-CM

## 2022-11-16 DIAGNOSIS — M6281 Muscle weakness (generalized): Secondary | ICD-10-CM

## 2022-11-16 DIAGNOSIS — G8929 Other chronic pain: Secondary | ICD-10-CM

## 2022-11-16 DIAGNOSIS — M25561 Pain in right knee: Secondary | ICD-10-CM | POA: Diagnosis not present

## 2022-11-16 DIAGNOSIS — R262 Difficulty in walking, not elsewhere classified: Secondary | ICD-10-CM

## 2022-11-16 NOTE — Therapy (Signed)
OUTPATIENT PHYSICAL THERAPY TREATMENT NOTE   Patient Name: [name] MRN: [id number] DOB:15-Jun-1941, 82 y.o., female Today's Date: 11/16/2022  PCP: Teena Irani, PA-C  REFERRING PROVIDER: Merryl Hacker, NP   END OF SESSION:   PT End of Session - 11/16/22 1613     Visit Number 9    Number of Visits 17    Date for PT Re-Evaluation 12/11/22    Authorization Type [employer] MCR    Authorization Time Period FOTO V6, V10, kx mod v15    Progress Note Due on Visit 10    PT Start Time 1615    PT Stop Time 1655    PT Time Calculation (min) 40 min    Activity Tolerance Patient tolerated treatment well    Behavior During Therapy WFL for tasks assessed/performed            Past Medical History:  Diagnosis Date   Anemia    PMH;  Patient denies this dx as of 06/03/21   Anxiety    Asthma    Breast cancer (HCC) 1988   left breast cancer   Collagenous colitis    Compression fracture    lumbar 1   Dyspnea    with exertion   Hypertension    Hypothyroidism    Osteoarthritis    Osteopenia    Pneumonia 07/2020   PONV (postoperative nausea and vomiting)    Sleep apnea    wears CPAP nightly   Past Surgical History:  Procedure Laterality Date   BREAST REDUCTION SURGERY     Right   CATARACT EXTRACTION W/ INTRAOCULAR LENS  IMPLANT, BILATERAL     CESAREAN SECTION     COLONOSCOPY W/ BIOPSIES AND POLYPECTOMY     CYST EXCISION N/A 08/30/2018   Procedure: excision of posterior shoulder / back sebaceous cyst;  Surgeon: Peggye Form, DO;  Location: [facility];  Service: Plastics;  Laterality: N/A;   KNEE ARTHROSCOPY Left 02/23/2022   Procedure: LEFT KNEE ARTHROSCOPY;  Surgeon: Marcene Corning, MD;  Location: [facility];  Service: Orthopedics;  Laterality: Left;   KYPHOPLASTY N/A 02/18/2016   Procedure: LUMBAR 1 KYPHOPLASTY;  Surgeon: Estill Bamberg, MD;  Location: [facility];  Service: Orthopedics;  Laterality: N/A;  LUMBAR 1 KYPHOPLASTY   KYPHOPLASTY N/A 06/04/2021    Procedure: LUMBAR 2 KYPHOPLASTY;  Surgeon: Estill Bamberg, MD;  Location: [facility];  Service: Orthopedics;  Laterality: N/A;   LAPAROSCOPIC APPENDECTOMY N/A 12/09/2021   Procedure: APPENDECTOMY LAPAROSCOPIC;  Surgeon: Andria Meuse, MD;  Location: [facility];  Service: General;  Laterality: N/A;   LUMBAR DISC SURGERY     MODIFIED RADICAL MASTECTOMY W/ AXILLARY LYMPH NODE DISSECTION     Left   RECONSTRUCTION BREAST W/ LATISSIMUS DORSI FLAP     left   REDUCTION MAMMAPLASTY Right    right foot surgery     corrected hammer toe, straightened big toe   SEPTOPLASTY     TOE SURGERY     TONSILLECTOMY AND ADENOIDECTOMY     Patient Active Problem List   Diagnosis Date Noted   Acute appendicitis 12/09/2021   S/P laparoscopic appendectomy 12/09/2021   CAP (community acquired pneumonia) 09/30/2020   Chronic respiratory failure with hypoxia (HCC) 09/23/2020   Allergic rhinitis 08/31/2019   Pilar cyst 08/04/2018   History of breast cancer in female 06/09/2018   History of reconstruction of both breasts 06/09/2018   Severe obesity (BMI >= 40) (HCC) 09/16/2015   Asthma with acute exacerbation 09/15/2015  DOE (dyspnea on exertion) 01/11/2014   Insomnia 05/05/2012   Osteoarthritis    Osteopenia    Collagenous colitis    OSA (obstructive sleep apnea)    Hypothyroidism    Mild persistent asthma    Hypertension    Breast cancer, left breast (HCC)     REFERRING DIAG: M19.90 (ICD-10-CM) - Unspecified osteoarthritis, unspecified site    R07.81 (ICD-10-CM) - Rib pain   THERAPY DIAG:  Chronic pain of both knees  Chronic pain of both shoulders  Rib pain on left side  Muscle weakness (generalized)  Difficulty in walking, not elsewhere classified  Rationale for Evaluation and Treatment Rehabilitation  PERTINENT HISTORY: Hypertension  Anxiety  History of lumbar compression fracture Osteopenia  OA Kyphoplasty 2017 and 2022 Knee arthroscopy 2023   PRECAUTIONS: fall   SUBJECTIVE:                                                                                                                                                                                       SUBJECTIVE STATEMENT:  Patient reports increased pain in Lt knee today, she partly attributes to the weather and being busy at home today.    PAIN:  Are you having pain? None currently; Yes: NPRS scale: 0 currently (Lt ribcage); 3 shoulders; 4 knees /10 Pain location: Lt ribcage, bilateral knees, bilateral shoulders  Pain description: sharp, hot, knife  Aggravating factors: rotating, changing positions, walking, transfers  Relieving factors: rest  OBJECTIVE: (objective measures completed at initial evaluation unless otherwise dated)  DIAGNOSTIC FINDINGS:  No recent imaging on file    PATIENT SURVEYS:  FOTO 40% function to 46% predicted  10/28/22: 41%   SCREENING FOR RED FLAGS: Bowel or bladder incontinence: No Spinal tumors: No Cauda equina syndrome: No Compression fracture: No Abdominal aneurysm: No   COGNITION: Overall cognitive status: Within functional limits for tasks assessed                          SENSATION: Not tested   MUSCLE LENGTH: Not tested    POSTURE: slump sitting posture    PALPATION: Not assessed    LUMBAR ROM:    AROM eval  Flexion WNL  Extension 75% limited Lt rib pain  Right lateral flexion WNL Lt rib pain  Left lateral flexion WNL Lt rib pain  Right rotation WNL  Left rotation WNL Lt rib pain   (Blank rows = not tested)   LOWER EXTREMITY ROM:      Active  Right eval Left eval  Hip flexion      Hip extension      Hip abduction  Hip adduction      Hip internal rotation      Hip external rotation      Knee flexion 115 100  Knee extension      Ankle dorsiflexion      Ankle plantarflexion      Ankle inversion      Ankle eversion       (Blank rows = not tested)   LOWER EXTREMITY MMT:     MMT Right eval Left eval  Hip flexion 4 4  Hip extension       Hip abduction 3+ 3+  Hip adduction      Hip internal rotation      Hip external rotation      Knee flexion 5 5  Knee extension 5 5  Ankle dorsiflexion      Ankle plantarflexion      Ankle inversion      Ankle eversion       (Blank rows = not tested) UPPER EXTREMITY MMT:   MMT Right eval Left eval  Shoulder flexion 4- 4-  Shoulder extension      Shoulder abduction 5 5  Shoulder adduction      Shoulder extension      Shoulder internal rotation 5 5  Shoulder external rotation 5 5  Middle trapezius      Lower trapezius      Elbow flexion      Elbow extension      Wrist flexion      Wrist extension      Wrist ulnar deviation      Wrist radial deviation      Wrist pronation      Wrist supination      Grip strength       (Blank rows = not tested) UPPER EXTREMITY ROM:   Active ROM Right eval Left eval  Shoulder flexion [facility]  Shoulder extension      Shoulder abduction [facility]  Shoulder adduction      Shoulder extension      Shoulder internal rotation L5 L5  Shoulder external rotation C5 C5  Elbow flexion      Elbow extension      Wrist flexion      Wrist extension      Wrist ulnar deviation      Wrist radial deviation      Wrist pronation      Wrist supination       (Blank rows = not tested)   SPECIAL TESTS:  Not tested    FUNCTIONAL TESTS:  30 second sit to stand: 2 reps, BUE support    2 MWT: 200 ft with rollator   10/19/22: 2 MWT: 200 ft with rollator  11/04/22: 30 second sit to stand: 7 reps; UE support for first rep   GAIT: Distance walked: 10 ft  Assistive device utilized: Environmental consultant - 4 wheeled Level of assistance: Modified independence Comments: excessive frontal plane movement, decreased step length, limited push-off   TREATMENT [facility] Adult PT Treatment:                                                DATE: 11/16/22 Therapeutic Exercise: NuStep level 5 x 5 minutes  LAQ 2 x 10 @ 2.5 lbs  Standing hip abduction x 10  Standing hip extension x  10  Standing hamstring curl  x 10  Standing march x 10  Standing calf raise x 10  Gait rolled into therex with rollator x185' Seated marching 2x30"  Seated hip adduction ball squeeze 3" hold 2x10  [facility] Adult PT Treatment:                                                DATE: 11/04/22 Therapeutic Exercise: NuStep level 5 x 5 minutes  LAQ 2 x 10 @ 2.5 lbs  Standing hip abduction 2 x 10  Standing hip extension 2 x 10  Standing hamstring curl 2 x 10  Standing march 2 x 10  Standing calf raise 2 x 10  Updated HEP   [facility] Adult PT Treatment:                                                DATE: 11/02/22 Therapeutic Exercise: NuStep level 5 x 5 minutes UE/LE LTR x1 minute Sidelying thoracic rotation x 10  Clamshells 2 x 10; 2# Counter stretch 3 x 10 sec  Standing hip abduction 2 x 10  Standing hip extension 2 x 10  Standing calf raises 2 x 10  Seated stability ball rollout x 1 minute     PATIENT EDUCATION:  Education details: HEP update Person educated: Patient Education method: Explanation, demo, cues, handout Education comprehension: verbalized understanding, returned demo, cues    HOME EXERCISE PROGRAM: Access Code: [email or web address] URL: [email or web address] Date: 10/15/2022 Prepared by: Letitia Libra  Exercises - Supine Lower Trunk Rotation  - 1 x daily - 7 x weekly - 3 sets - 10 reps - Hooklying Single Knee to Chest Stretch  - 1 x daily - 7 x weekly - 3 sets - 10 reps - Sidelying Thoracic Rotation with Open Book  - 1 x daily - 7 x weekly - 2 sets - 10 reps - Seated Hip Abduction with Resistance  - 1 x daily - 7 x weekly - 2 sets - 10 reps - Seated March with Resistance  - 1 x daily - 7 x weekly - 2 sets - 10 reps - Seated Elbow Flexion with Resistance  - 1 x daily - 7 x weekly - 2 sets - 10 reps - Seated Elbow Extension with Self-Anchored Resistance  - 1 x daily - 7 x weekly - 2 sets - 10 reps   ASSESSMENT:   CLINICAL IMPRESSION: Patient presents to PT  reporting increased pain in her Lt knee today, states that her shoulders and Lt rib cage area are "not too bad" today. Session today continued to focus on LE strengthening and improving standing activity tolerance. She was not able to tolerate as much standing exercise today due to increased Lt knee pain. Patient continues to benefit from skilled PT services and should be progressed as able to improve functional independence.     OBJECTIVE IMPAIRMENTS: Abnormal gait, decreased activity tolerance, decreased balance, decreased endurance, decreased knowledge of condition, difficulty walking, decreased ROM, decreased strength, improper body mechanics, postural dysfunction, and pain.    ACTIVITY LIMITATIONS: carrying, lifting, bending, standing, squatting, transfers, bed mobility, toileting, dressing, reach over head, hygiene/grooming, locomotion level, and caring for others   PARTICIPATION LIMITATIONS: meal prep, cleaning, laundry, driving, shopping, and community activity  PERSONAL FACTORS: Age, Fitness, Past/current experiences, Time since onset of injury/illness/exacerbation, and 3+ comorbidities: see PMH above  are also affecting patient's functional outcome.    REHAB POTENTIAL: Fair chronicity, previous PT   CLINICAL DECISION MAKING: Evolving/moderate complexity   EVALUATION COMPLEXITY: Moderate     GOALS: Goals reviewed with patient? Yes   SHORT TERM GOALS: Target date: 11/09/2022      Patient will be independent and compliant with initial HEP.    Baseline: no time to issue Goal status: met   2.  Patient will be independent with car transfers.  Baseline: supervision  11/02/22: able to transfer independently Goal status: met   3.  Patient will complete at least 4 reps during 30 second sit to stand to signify improvements in functional strength.  Baseline: see above  Goal status: met   4.  Patient will demonstrate pain free trunk AROM to improve tolerance to reaching/bending  activity.  Baseline: see above  Goal status: INITIAL   LONG TERM GOALS: Target date: 12/11/2022       Patient will be able to place 2 lbs in overhead shelf with minimal shoulder pain to improve ability to reach and grab items in kitchen shelves.  Baseline: see above Goal status: INITIAL   2.  Patient will score at least 46% on FOTO to signify clinically meaningful improvement in functional abilities.    Baseline: see above Goal status: INITIAL   3.  Patient will walk at least 250 ft during 2 MWT with LRAD to improve tolerance to community ambulation.  Baseline: see above Goal status: INITIAL   4.  Patient will demonstrate at least 4/5 bilateral hip abductor strength to improve stability about the chain with walking/standing activity.  Baseline: see above Goal status: INITIAL   5.  Patient will be independent with advanced home program to assist in management of her chronic condition.  Baseline: see above  Goal status: INITIAL   PLAN:   PT FREQUENCY: 1-2x/week   PT DURATION: 8 weeks   PLANNED INTERVENTIONS: Therapeutic exercises, Therapeutic activity, Neuromuscular re-education, Balance training, Gait training, Patient/Family education, Self Care, DME instructions, Cryotherapy, Moist heat, Manual therapy, and Re-evaluation.   PLAN FOR NEXT SESSION:shoulder/hip strengthening, trunk mobility.    Berta Minor PTA 11/16/22 4:52 PM

## 2022-11-18 ENCOUNTER — Ambulatory Visit: Payer: Medicare HMO

## 2022-11-18 DIAGNOSIS — M6281 Muscle weakness (generalized): Secondary | ICD-10-CM

## 2022-11-18 DIAGNOSIS — R262 Difficulty in walking, not elsewhere classified: Secondary | ICD-10-CM

## 2022-11-18 DIAGNOSIS — R0781 Pleurodynia: Secondary | ICD-10-CM

## 2022-11-18 DIAGNOSIS — G8929 Other chronic pain: Secondary | ICD-10-CM

## 2022-11-18 DIAGNOSIS — M25561 Pain in right knee: Secondary | ICD-10-CM | POA: Diagnosis not present

## 2022-11-18 NOTE — Therapy (Signed)
OUTPATIENT PHYSICAL THERAPY TREATMENT NOTE  Progress Note Reporting Period 10/12/22 to 11/18/22  See note below for Objective Data and Assessment of Progress/Goals.     Patient Name: [name] MRN: [id number] DOB:05-28-1941, 82 y.o., female Today's Date: 11/18/2022  PCP: Lucila Maine  REFERRING PROVIDER: Merryl Hacker, NP   END OF SESSION:   PT End of Session - 11/18/22 1146     Visit Number 10    Number of Visits 17    Date for PT Re-Evaluation 12/11/22    Authorization Type [employer] MCR    Authorization Time Period FOTO V6, V10, kx mod v15    Progress Note Due on Visit 10    PT Start Time 1146    PT Stop Time 1226    PT Time Calculation (min) 40 min    Activity Tolerance Patient tolerated treatment well    Behavior During Therapy WFL for tasks assessed/performed             Past Medical History:  Diagnosis Date   Anemia    PMH;  Patient denies this dx as of 06/03/21   Anxiety    Asthma    Breast cancer (HCC) 1988   left breast cancer   Collagenous colitis    Compression fracture    lumbar 1   Dyspnea    with exertion   Hypertension    Hypothyroidism    Osteoarthritis    Osteopenia    Pneumonia 07/2020   PONV (postoperative nausea and vomiting)    Sleep apnea    wears CPAP nightly   Past Surgical History:  Procedure Laterality Date   BREAST REDUCTION SURGERY     Right   CATARACT EXTRACTION W/ INTRAOCULAR LENS  IMPLANT, BILATERAL     CESAREAN SECTION     COLONOSCOPY W/ BIOPSIES AND POLYPECTOMY     CYST EXCISION N/A 08/30/2018   Procedure: excision of posterior shoulder / back sebaceous cyst;  Surgeon: Peggye Form, DO;  Location:  [facility];  Service: Plastics;  Laterality: N/A;   KNEE ARTHROSCOPY Left 02/23/2022   Procedure: LEFT KNEE ARTHROSCOPY;  Surgeon: Marcene Corning, MD;  Location: [facility];  Service: Orthopedics;  Laterality: Left;   KYPHOPLASTY N/A 02/18/2016   Procedure: LUMBAR 1 KYPHOPLASTY;  Surgeon:  Estill Bamberg, MD;  Location: [facility];  Service: Orthopedics;  Laterality: N/A;  LUMBAR 1 KYPHOPLASTY   KYPHOPLASTY N/A 06/04/2021   Procedure: LUMBAR 2 KYPHOPLASTY;  Surgeon: Estill Bamberg, MD;  Location: [facility];  Service: Orthopedics;  Laterality: N/A;   LAPAROSCOPIC APPENDECTOMY N/A 12/09/2021   Procedure: APPENDECTOMY LAPAROSCOPIC;  Surgeon: Andria Meuse, MD;  Location: [facility];  Service: General;  Laterality: N/A;   LUMBAR DISC SURGERY     MODIFIED RADICAL MASTECTOMY W/ AXILLARY LYMPH NODE DISSECTION     Left   RECONSTRUCTION BREAST W/ LATISSIMUS DORSI FLAP     left   REDUCTION MAMMAPLASTY Right    right foot surgery     corrected hammer toe, straightened big toe   SEPTOPLASTY     TOE SURGERY     TONSILLECTOMY AND ADENOIDECTOMY     Patient Active Problem List   Diagnosis Date Noted   Acute appendicitis 12/09/2021   S/P laparoscopic appendectomy 12/09/2021   CAP (community acquired pneumonia) 09/30/2020   Chronic respiratory failure with hypoxia (HCC) 09/23/2020   Allergic rhinitis 08/31/2019   Pilar cyst 08/04/2018   History of breast cancer in female 06/09/2018   History of reconstruction  of both breasts 06/09/2018   Severe obesity (BMI >= 40) (HCC) 09/16/2015   Asthma with acute exacerbation 09/15/2015   DOE (dyspnea on exertion) 01/11/2014   Insomnia 05/05/2012   Osteoarthritis    Osteopenia    Collagenous colitis    OSA (obstructive sleep apnea)    Hypothyroidism    Mild persistent asthma    Hypertension    Breast cancer, left breast (HCC)     REFERRING DIAG: M19.90 (ICD-10-CM) - Unspecified osteoarthritis, unspecified site    R07.81 (ICD-10-CM) - Rib pain   THERAPY DIAG:  Chronic pain of both knees  Chronic pain of both shoulders  Rib pain on left side  Muscle weakness (generalized)  Difficulty in walking, not elsewhere classified  Rationale for Evaluation and Treatment Rehabilitation  PERTINENT HISTORY: Hypertension  Anxiety  History of  lumbar compression fracture Osteopenia  OA Kyphoplasty 2017 and 2022 Knee arthroscopy 2023   PRECAUTIONS: fall   SUBJECTIVE:                                                                                                                                                                                      SUBJECTIVE STATEMENT:  "Good, but not great. Still waiting on the injections in my knee. The ribcage was doing great, but then then Rt one was hurting yesterday."    PAIN:  Are you having pain? None currently; NPRS scale:4 shoulders; 5 knees at worst/10 Pain location: Lt ribcage, bilateral knees, bilateral shoulders  Pain description: sharp, hot, knife  Aggravating factors: rotating, changing positions, walking, transfers  Relieving factors: rest  OBJECTIVE: (objective measures completed at initial evaluation unless otherwise dated)  DIAGNOSTIC FINDINGS:  No recent imaging on file    PATIENT SURVEYS:  FOTO 40% function to 46% predicted  10/28/22: 41%  11/18/22: FOTO 44% function    SCREENING FOR RED FLAGS: Bowel or bladder incontinence: No Spinal tumors: No Cauda equina syndrome: No Compression fracture: No Abdominal aneurysm: No   COGNITION: Overall cognitive status: Within functional limits for tasks assessed                          SENSATION: Not tested   MUSCLE LENGTH: Not tested    POSTURE: slump sitting posture    PALPATION: Not assessed    LUMBAR ROM:    AROM eval 11/18/22  Flexion WNL WNL  Extension 75% limited Lt rib pain 75% limited  Right lateral flexion WNL Lt rib pain WNL  Left lateral flexion WNL Lt rib pain WNL  Right rotation WNL WNL  Left rotation WNL Lt rib pain WNL   (Blank rows =  not tested)   LOWER EXTREMITY ROM:      Active  Right eval Left eval  Hip flexion      Hip extension      Hip abduction      Hip adduction      Hip internal rotation      Hip external rotation      Knee flexion 115 100  Knee extension      Ankle  dorsiflexion      Ankle plantarflexion      Ankle inversion      Ankle eversion       (Blank rows = not tested)   LOWER EXTREMITY MMT:     MMT Right eval Left eval 11/18/22  Hip flexion 4 4   Hip extension       Hip abduction 3+ 3+ 4- bilateral   Hip adduction       Hip internal rotation       Hip external rotation       Knee flexion 5 5   Knee extension 5 5   Ankle dorsiflexion       Ankle plantarflexion       Ankle inversion       Ankle eversion        (Blank rows = not tested) UPPER EXTREMITY MMT:   MMT Right eval Left eval  Shoulder flexion 4- 4-  Shoulder extension      Shoulder abduction 5 5  Shoulder adduction      Shoulder extension      Shoulder internal rotation 5 5  Shoulder external rotation 5 5  Middle trapezius      Lower trapezius      Elbow flexion      Elbow extension      Wrist flexion      Wrist extension      Wrist ulnar deviation      Wrist radial deviation      Wrist pronation      Wrist supination      Grip strength       (Blank rows = not tested) UPPER EXTREMITY ROM:   Active ROM Right eval Left eval  Shoulder flexion [facility]  Shoulder extension      Shoulder abduction [facility]  Shoulder adduction      Shoulder extension      Shoulder internal rotation L5 L5  Shoulder external rotation C5 C5  Elbow flexion      Elbow extension      Wrist flexion      Wrist extension      Wrist ulnar deviation      Wrist radial deviation      Wrist pronation      Wrist supination       (Blank rows = not tested)   SPECIAL TESTS:  Not tested    FUNCTIONAL TESTS:  30 second sit to stand: 2 reps, BUE support    2 MWT: 200 ft with rollator   10/19/22: 2 MWT: 200 ft with rollator  11/04/22: 30 second sit to stand: 7 reps; UE support for first rep   11/18/22: 2 MWT: 200 ft with rollator   GAIT: Distance walked: 10 ft  Assistive device utilized: Environmental consultant - 4 wheeled Level of assistance: Modified independence Comments: excessive frontal  plane movement, decreased step length, limited push-off   TREATMENT [facility] Adult PT Treatment:  DATE: 11/18/22 Therapeutic Exercise: Standing resisted hip abduction red band 2 x 10  Standing resisted hip extension red band 2 x 10  Standing calf raise 2 x 10  HEP review   Therapeutic Activity: Re-assessment to determine overall progress, educating patient on progress towards goals.   Self Care: Discussed [facility] wellness program as a continuation of fitness training after PT with brochure provided.    [facility] Adult PT Treatment:                                                DATE: 11/16/22 Therapeutic Exercise: NuStep level 5 x 5 minutes  LAQ 2 x 10 @ 2.5 lbs  Standing hip abduction x 10  Standing hip extension x 10  Standing hamstring curl x 10  Standing march x 10  Standing calf raise x 10  Gait rolled into therex with rollator x185' Seated marching 2x30"  Seated hip adduction ball squeeze 3" hold 2x10  [facility] Adult PT Treatment:                                                DATE: 11/04/22 Therapeutic Exercise: NuStep level 5 x 5 minutes  LAQ 2 x 10 @ 2.5 lbs  Standing hip abduction 2 x 10  Standing hip extension 2 x 10  Standing hamstring curl 2 x 10  Standing march 2 x 10  Standing calf raise 2 x 10  Updated HEP    PATIENT EDUCATION:  Education details: HEP review Person educated: Patient Education method: Explanation Education comprehension: verbalized understanding   HOME EXERCISE PROGRAM: Access Code: [email or web address] URL: [email or web address] Date: 10/15/2022 Prepared by: Letitia Libra  Exercises - Supine Lower Trunk Rotation  - 1 x daily - 7 x weekly - 3 sets - 10 reps - Hooklying Single Knee to Chest Stretch  - 1 x daily - 7 x weekly - 3 sets - 10 reps - Sidelying Thoracic Rotation with Open Book  - 1 x daily - 7 x weekly - 2 sets - 10 reps - Seated Hip Abduction with Resistance  - 1 x daily - 7 x weekly -  2 sets - 10 reps - Seated March with Resistance  - 1 x daily - 7 x weekly - 2 sets - 10 reps - Seated Elbow Flexion with Resistance  - 1 x daily - 7 x weekly - 2 sets - 10 reps - Seated Elbow Extension with Self-Anchored Resistance  - 1 x daily - 7 x weekly - 2 sets - 10 reps   ASSESSMENT:   CLINICAL IMPRESSION: [place] is making gradual progress in PT for her chronic shoulder, knee, and ribcage pain. She has met all short term functional goals demonstrating pain free trunk AROM, improved functional strength as noted through 30 second sit to stand, and ability to complete car transfers independently. She is making steady progress towards long term goals having met 1/5 and will benefit from continuing with current POC to address lingering strength/endurance deficits in order to maximize her functional independence.     OBJECTIVE IMPAIRMENTS: Abnormal gait, decreased activity tolerance, decreased balance, decreased endurance, decreased knowledge of condition, difficulty walking, decreased ROM, decreased strength, improper body mechanics, postural dysfunction, and pain.  ACTIVITY LIMITATIONS: carrying, lifting, bending, standing, squatting, transfers, bed mobility, toileting, dressing, reach over head, hygiene/grooming, locomotion level, and caring for others   PARTICIPATION LIMITATIONS: meal prep, cleaning, laundry, driving, shopping, and community activity   PERSONAL FACTORS: Age, Fitness, Past/current experiences, Time since onset of injury/illness/exacerbation, and 3+ comorbidities: see PMH above  are also affecting patient's functional outcome.    REHAB POTENTIAL: Fair chronicity, previous PT   CLINICAL DECISION MAKING: Evolving/moderate complexity   EVALUATION COMPLEXITY: Moderate     GOALS: Goals reviewed with patient? Yes   SHORT TERM GOALS: Target date: 11/09/2022      Patient will be independent and compliant with initial HEP.    Baseline: no time to issue Goal status: met    2.  Patient will be independent with car transfers.  Baseline: supervision  11/02/22: able to transfer independently Goal status: met   3.  Patient will complete at least 4 reps during 30 second sit to stand to signify improvements in functional strength.  Baseline: see above  Goal status: met   4.  Patient will demonstrate pain free trunk AROM to improve tolerance to reaching/bending activity.  Baseline: see above  Goal status: met   LONG TERM GOALS: Target date: 12/11/2022       Patient will be able to place 2 lbs in overhead shelf with minimal shoulder pain to improve ability to reach and grab items in kitchen shelves.  Baseline: see above 11/18/22: no pain with placing 2 lbs in overhead shelf  Goal status: met   2.  Patient will score at least 46% on FOTO to signify clinically meaningful improvement in functional abilities.    Baseline: see above Goal status: progressing    3.  Patient will walk at least 250 ft during 2 MWT with LRAD to improve tolerance to community ambulation.  Baseline: see above Goal status: ongoing    4.  Patient will demonstrate at least 4/5 bilateral hip abductor strength to improve stability about the chain with walking/standing activity.  Baseline: see above Goal status: progressing    5.  Patient will be independent with advanced home program to assist in management of her chronic condition.  Baseline: see above  Goal status: ongoing    PLAN:   PT FREQUENCY: 1-2x/week   PT DURATION: 8 weeks   PLANNED INTERVENTIONS: Therapeutic exercises, Therapeutic activity, Neuromuscular re-education, Balance training, Gait training, Patient/Family education, Self Care, DME instructions, Cryotherapy, Moist heat, Manual therapy, and Re-evaluation.   PLAN FOR NEXT SESSION:shoulder/hip strengthening, trunk mobility.     Letitia Libra, PT, DPT, ATC 11/18/22 12:26 PM

## 2022-11-23 ENCOUNTER — Ambulatory Visit: Payer: Medicare HMO

## 2022-11-23 ENCOUNTER — Other Ambulatory Visit: Payer: Self-pay

## 2022-11-23 ENCOUNTER — Telehealth: Payer: Self-pay | Admitting: Pulmonary Disease

## 2022-11-23 DIAGNOSIS — G8929 Other chronic pain: Secondary | ICD-10-CM

## 2022-11-23 DIAGNOSIS — R0781 Pleurodynia: Secondary | ICD-10-CM

## 2022-11-23 DIAGNOSIS — M6281 Muscle weakness (generalized): Secondary | ICD-10-CM

## 2022-11-23 DIAGNOSIS — M25561 Pain in right knee: Secondary | ICD-10-CM | POA: Diagnosis not present

## 2022-11-23 DIAGNOSIS — R262 Difficulty in walking, not elsewhere classified: Secondary | ICD-10-CM

## 2022-11-23 MED ORDER — FLUTICASONE-SALMETEROL 115-21 MCG/ACT IN AERO: INHALATION_SPRAY | RESPIRATORY_TRACT | 3 refills | Status: DC

## 2022-11-23 NOTE — Therapy (Signed)
OUTPATIENT PHYSICAL THERAPY TREATMENT NOTE    Patient Name: [name] MRN: [id number] DOB:08-Feb-1941, 82 y.o., female Today's Date: 11/23/2022  PCP: Teena Irani, PA-C  REFERRING PROVIDER: Merryl Hacker, NP   END OF SESSION:   PT End of Session - 11/23/22 1304     Visit Number 11    Number of Visits 17    Authorization Type [employer] MCR    Authorization Time Period FOTO V6, V10, kx mod v15    Progress Note Due on Visit 10    PT Start Time 1303    PT Stop Time 1343    PT Time Calculation (min) 40 min    Activity Tolerance Patient tolerated treatment well    Behavior During Therapy WFL for tasks assessed/performed              Past Medical History:  Diagnosis Date   Anemia    PMH;  Patient denies this dx as of 06/03/21   Anxiety    Asthma    Breast cancer (HCC) 1988   left breast cancer   Collagenous colitis    Compression fracture    lumbar 1   Dyspnea    with exertion   Hypertension    Hypothyroidism    Osteoarthritis    Osteopenia    Pneumonia 07/2020   PONV (postoperative nausea and vomiting)    Sleep apnea    wears CPAP nightly   Past Surgical History:  Procedure Laterality Date   BREAST REDUCTION SURGERY     Right   CATARACT EXTRACTION W/ INTRAOCULAR LENS  IMPLANT, BILATERAL     CESAREAN SECTION     COLONOSCOPY W/ BIOPSIES AND POLYPECTOMY     CYST EXCISION N/A 08/30/2018   Procedure: excision of posterior shoulder / back sebaceous cyst;  Surgeon: Peggye Form, DO;  Location: [facility];  Service: Plastics;  Laterality: N/A;   KNEE ARTHROSCOPY Left 02/23/2022   Procedure: LEFT KNEE ARTHROSCOPY;  Surgeon: Marcene Corning, MD;  Location: [facility];  Service: Orthopedics;  Laterality: Left;   KYPHOPLASTY N/A 02/18/2016   Procedure: LUMBAR 1 KYPHOPLASTY;  Surgeon: Estill Bamberg, MD;  Location: [facility];  Service: Orthopedics;  Laterality: N/A;  LUMBAR 1 KYPHOPLASTY   KYPHOPLASTY N/A 06/04/2021   Procedure: LUMBAR 2  KYPHOPLASTY;  Surgeon: Estill Bamberg, MD;  Location: [facility];  Service: Orthopedics;  Laterality: N/A;   LAPAROSCOPIC APPENDECTOMY N/A 12/09/2021   Procedure: APPENDECTOMY LAPAROSCOPIC;  Surgeon: Andria Meuse, MD;  Location: [facility];  Service: General;  Laterality: N/A;   LUMBAR DISC SURGERY     MODIFIED RADICAL MASTECTOMY W/ AXILLARY LYMPH NODE DISSECTION     Left   RECONSTRUCTION BREAST W/ LATISSIMUS DORSI FLAP     left   REDUCTION MAMMAPLASTY Right    right foot surgery     corrected hammer toe, straightened big toe   SEPTOPLASTY     TOE SURGERY     TONSILLECTOMY AND ADENOIDECTOMY     Patient Active Problem List   Diagnosis Date Noted   Acute appendicitis 12/09/2021   S/P laparoscopic appendectomy 12/09/2021   CAP (community acquired pneumonia) 09/30/2020   Chronic respiratory failure with hypoxia (HCC) 09/23/2020   Allergic rhinitis 08/31/2019   Pilar cyst 08/04/2018   History of breast cancer in female 06/09/2018   History of reconstruction of both breasts 06/09/2018   Severe obesity (BMI >= 40) (HCC) 09/16/2015   Asthma with acute exacerbation 09/15/2015   DOE (dyspnea on exertion) 01/11/2014  Insomnia 05/05/2012   Osteoarthritis    Osteopenia    Collagenous colitis    OSA (obstructive sleep apnea)    Hypothyroidism    Mild persistent asthma    Hypertension    Breast cancer, left breast (HCC)     REFERRING DIAG: M19.90 (ICD-10-CM) - Unspecified osteoarthritis, unspecified site    R07.81 (ICD-10-CM) - Rib pain   THERAPY DIAG:  Chronic pain of both knees  Chronic pain of both shoulders  Rib pain on left side  Muscle weakness (generalized)  Difficulty in walking, not elsewhere classified  Chronic pain of left knee  Rationale for Evaluation and Treatment Rehabilitation  PERTINENT HISTORY: Hypertension  Anxiety  History of lumbar compression fracture Osteopenia  OA Kyphoplasty 2017 and 2022 Knee arthroscopy 2023   PRECAUTIONS: fall    SUBJECTIVE:                                                                                                                                                                                      SUBJECTIVE STATEMENT: Patient reports improvements in her pain today, stating that the ribcage is feeling better. She also states that she was able to sleep on her Rt side one night last week.    PAIN:  Are you having pain? None currently; NPRS scale: 0 shoulders; 4 knees at worst/10 Pain location: Lt ribcage, bilateral knees, bilateral shoulders  Pain description: sharp, hot, knife  Aggravating factors: rotating, changing positions, walking, transfers  Relieving factors: rest  OBJECTIVE: (objective measures completed at initial evaluation unless otherwise dated)  DIAGNOSTIC FINDINGS:  No recent imaging on file    PATIENT SURVEYS:  FOTO 40% function to 46% predicted  10/28/22: 41%  11/18/22: FOTO 44% function    SCREENING FOR RED FLAGS: Bowel or bladder incontinence: No Spinal tumors: No Cauda equina syndrome: No Compression fracture: No Abdominal aneurysm: No   COGNITION: Overall cognitive status: Within functional limits for tasks assessed                          SENSATION: Not tested   MUSCLE LENGTH: Not tested    POSTURE: slump sitting posture    PALPATION: Not assessed    LUMBAR ROM:    AROM eval 11/18/22  Flexion WNL WNL  Extension 75% limited Lt rib pain 75% limited  Right lateral flexion WNL Lt rib pain WNL  Left lateral flexion WNL Lt rib pain WNL  Right rotation WNL WNL  Left rotation WNL Lt rib pain WNL   (Blank rows = not tested)   LOWER EXTREMITY ROM:      Active  Right eval Left  eval  Hip flexion      Hip extension      Hip abduction      Hip adduction      Hip internal rotation      Hip external rotation      Knee flexion 115 100  Knee extension      Ankle dorsiflexion      Ankle plantarflexion      Ankle inversion      Ankle eversion        (Blank rows = not tested)   LOWER EXTREMITY MMT:     MMT Right eval Left eval 11/18/22  Hip flexion 4 4   Hip extension       Hip abduction 3+ 3+ 4- bilateral   Hip adduction       Hip internal rotation       Hip external rotation       Knee flexion 5 5   Knee extension 5 5   Ankle dorsiflexion       Ankle plantarflexion       Ankle inversion       Ankle eversion        (Blank rows = not tested) UPPER EXTREMITY MMT:   MMT Right eval Left eval  Shoulder flexion 4- 4-  Shoulder extension      Shoulder abduction 5 5  Shoulder adduction      Shoulder extension      Shoulder internal rotation 5 5  Shoulder external rotation 5 5  Middle trapezius      Lower trapezius      Elbow flexion      Elbow extension      Wrist flexion      Wrist extension      Wrist ulnar deviation      Wrist radial deviation      Wrist pronation      Wrist supination      Grip strength       (Blank rows = not tested) UPPER EXTREMITY ROM:   Active ROM Right eval Left eval  Shoulder flexion [facility]  Shoulder extension      Shoulder abduction [facility]  Shoulder adduction      Shoulder extension      Shoulder internal rotation L5 L5  Shoulder external rotation C5 C5  Elbow flexion      Elbow extension      Wrist flexion      Wrist extension      Wrist ulnar deviation      Wrist radial deviation      Wrist pronation      Wrist supination       (Blank rows = not tested)   SPECIAL TESTS:  Not tested    FUNCTIONAL TESTS:  30 second sit to stand: 2 reps, BUE support    2 MWT: 200 ft with rollator   10/19/22: 2 MWT: 200 ft with rollator  11/04/22: 30 second sit to stand: 7 reps; UE support for first rep   11/18/22: 2 MWT: 200 ft with rollator   GAIT: Distance walked: 10 ft  Assistive device utilized: Environmental consultant - 4 wheeled Level of assistance: Modified independence Comments: excessive frontal plane movement, decreased step length, limited push-off   TREATMENT [facility] Adult PT  Treatment:  DATE: 11/23/22 Therapeutic Exercise: NuStep level 5 x 5 minutes  Standing resisted hip abduction red band 2 x 10 (pain in L knee when standing on LLE) Standing resisted hip extension red band 2 x 10  Standing calf raise 2 x 10  Standing hamstring curl 2x10 Seated hip adduction ball squeeze 5" hold 2x10 Standing march 2x30" Gait rolled into therex with rollator 2x185' ( no rest break in btw today) STS x5 (pain in Lt knee)   [facility] Adult PT Treatment:                                                DATE: 11/18/22 Therapeutic Exercise: Standing resisted hip abduction red band 2 x 10  Standing resisted hip extension red band 2 x 10  Standing calf raise 2 x 10  HEP review   Therapeutic Activity: Re-assessment to determine overall progress, educating patient on progress towards goals.   Self Care: Discussed [facility] wellness program as a continuation of fitness training after PT with brochure provided.    [facility] Adult PT Treatment:                                                DATE: 11/16/22 Therapeutic Exercise: NuStep level 5 x 5 minutes  LAQ 2 x 10 @ 2.5 lbs  Standing hip abduction x 10  Standing hip extension x 10  Standing hamstring curl x 10  Standing march x 10  Standing calf raise x 10  Gait rolled into therex with rollator x185' Seated marching 2x30"  Seated hip adduction ball squeeze 3" hold 2x10    PATIENT EDUCATION:  Education details: HEP review Person educated: Patient Education method: Explanation Education comprehension: verbalized understanding   HOME EXERCISE PROGRAM: Access Code: [email or web address] URL: [email or web address] Date: 10/15/2022 Prepared by: Letitia Libra  Exercises - Supine Lower Trunk Rotation  - 1 x daily - 7 x weekly - 3 sets - 10 reps - Hooklying Single Knee to Chest Stretch  - 1 x daily - 7 x weekly - 3 sets - 10 reps - Sidelying Thoracic Rotation with Open Book  - 1 x daily  - 7 x weekly - 2 sets - 10 reps - Seated Hip Abduction with Resistance  - 1 x daily - 7 x weekly - 2 sets - 10 reps - Seated March with Resistance  - 1 x daily - 7 x weekly - 2 sets - 10 reps - Seated Elbow Flexion with Resistance  - 1 x daily - 7 x weekly - 2 sets - 10 reps - Seated Elbow Extension with Self-Anchored Resistance  - 1 x daily - 7 x weekly - 2 sets - 10 reps   ASSESSMENT:   CLINICAL IMPRESSION: Patient presents to PT reporting improved overall pain today, most bothersome being her knees with L>R. Session today continued to focus on LE strengthening and improving standing activity tolerance. She had an increase in knee pain with SLS activities involving the RLE, need occasional seated rest breaks. She also was only able to complete 5 STS before her Lt knee became too painful to continue. Patient continues to benefit from skilled PT services and should be progressed as able to improve functional independence.  OBJECTIVE IMPAIRMENTS: Abnormal gait, decreased activity tolerance, decreased balance, decreased endurance, decreased knowledge of condition, difficulty walking, decreased ROM, decreased strength, improper body mechanics, postural dysfunction, and pain.    ACTIVITY LIMITATIONS: carrying, lifting, bending, standing, squatting, transfers, bed mobility, toileting, dressing, reach over head, hygiene/grooming, locomotion level, and caring for others   PARTICIPATION LIMITATIONS: meal prep, cleaning, laundry, driving, shopping, and community activity   PERSONAL FACTORS: Age, Fitness, Past/current experiences, Time since onset of injury/illness/exacerbation, and 3+ comorbidities: see PMH above  are also affecting patient's functional outcome.    REHAB POTENTIAL: Fair chronicity, previous PT   CLINICAL DECISION MAKING: Evolving/moderate complexity   EVALUATION COMPLEXITY: Moderate     GOALS: Goals reviewed with patient? Yes   SHORT TERM GOALS: Target date: 11/09/2022       Patient will be independent and compliant with initial HEP.    Baseline: no time to issue Goal status: met   2.  Patient will be independent with car transfers.  Baseline: supervision  11/02/22: able to transfer independently Goal status: met   3.  Patient will complete at least 4 reps during 30 second sit to stand to signify improvements in functional strength.  Baseline: see above  Goal status: met   4.  Patient will demonstrate pain free trunk AROM to improve tolerance to reaching/bending activity.  Baseline: see above  Goal status: met   LONG TERM GOALS: Target date: 12/11/2022       Patient will be able to place 2 lbs in overhead shelf with minimal shoulder pain to improve ability to reach and grab items in kitchen shelves.  Baseline: see above 11/18/22: no pain with placing 2 lbs in overhead shelf  Goal status: met   2.  Patient will score at least 46% on FOTO to signify clinically meaningful improvement in functional abilities.    Baseline: see above Goal status: progressing    3.  Patient will walk at least 250 ft during 2 MWT with LRAD to improve tolerance to community ambulation.  Baseline: see above Goal status: ongoing    4.  Patient will demonstrate at least 4/5 bilateral hip abductor strength to improve stability about the chain with walking/standing activity.  Baseline: see above Goal status: progressing    5.  Patient will be independent with advanced home program to assist in management of her chronic condition.  Baseline: see above  Goal status: ongoing    PLAN:   PT FREQUENCY: 1-2x/week   PT DURATION: 8 weeks   PLANNED INTERVENTIONS: Therapeutic exercises, Therapeutic activity, Neuromuscular re-education, Balance training, Gait training, Patient/Family education, Self Care, DME instructions, Cryotherapy, Moist heat, Manual therapy, and Re-evaluation.   PLAN FOR NEXT SESSION:shoulder/hip strengthening, trunk mobility.     Berta Minor  PTA 11/23/22 1:42 PM

## 2022-11-23 NOTE — Telephone Encounter (Signed)
PT calling for Advair refill. States tried to call Pharm but "... Can't seem to get anywhere."   Please send in request.TY.   Karin Golden on [employer]

## 2022-11-23 NOTE — Telephone Encounter (Signed)
Rx has been refilled and sent to patients pharmacy.   Nothing further needed.

## 2022-11-25 ENCOUNTER — Ambulatory Visit: Payer: Medicare HMO

## 2022-11-25 ENCOUNTER — Other Ambulatory Visit (HOSPITAL_COMMUNITY): Payer: Self-pay

## 2022-11-25 DIAGNOSIS — G8929 Other chronic pain: Secondary | ICD-10-CM

## 2022-11-25 DIAGNOSIS — M25561 Pain in right knee: Secondary | ICD-10-CM | POA: Diagnosis not present

## 2022-11-25 DIAGNOSIS — R0781 Pleurodynia: Secondary | ICD-10-CM

## 2022-11-25 DIAGNOSIS — R262 Difficulty in walking, not elsewhere classified: Secondary | ICD-10-CM

## 2022-11-25 DIAGNOSIS — M6281 Muscle weakness (generalized): Secondary | ICD-10-CM

## 2022-11-25 NOTE — Therapy (Signed)
OUTPATIENT PHYSICAL THERAPY TREATMENT NOTE    Patient Name: [name] MRN: [id number] DOB:[birth year], 82 y.o., female Today's Date: 11/25/2022  PCP: Teena Irani, PA-C  REFERRING PROVIDER: Merryl Hacker, NP   END OF SESSION:   PT End of Session - 11/25/22 1449     Visit Number 12    Number of Visits 17    Authorization Type [employer] MCR    Authorization Time Period FOTO V6, V10, kx mod v15    Progress Note Due on Visit 10    PT Start Time 1449    PT Stop Time 1529    PT Time Calculation (min) 40 min    Activity Tolerance Patient tolerated treatment well    Behavior During Therapy WFL for tasks assessed/performed               Past Medical History:  Diagnosis Date   Anemia    PMH;  Patient denies this dx as of 06/03/21   Anxiety    Asthma    Breast cancer (HCC) 1988   left breast cancer   Collagenous colitis    Compression fracture    lumbar 1   Dyspnea    with exertion   Hypertension    Hypothyroidism    Osteoarthritis    Osteopenia    Pneumonia 07/2020   PONV (postoperative nausea and vomiting)    Sleep apnea    wears CPAP nightly   Past Surgical History:  Procedure Laterality Date   BREAST REDUCTION SURGERY     Right   CATARACT EXTRACTION W/ INTRAOCULAR LENS  IMPLANT, BILATERAL     CESAREAN SECTION     COLONOSCOPY W/ BIOPSIES AND POLYPECTOMY     CYST EXCISION N/A 08/30/2018   Procedure: excision of posterior shoulder / back sebaceous cyst;  Surgeon: Peggye Form, DO;  Location: [facility];  Service: Plastics;  Laterality: N/A;   KNEE ARTHROSCOPY Left 02/23/2022   Procedure: LEFT KNEE ARTHROSCOPY;  Surgeon: Marcene Corning, MD;  Location: [facility];  Service: Orthopedics;  Laterality: Left;   KYPHOPLASTY N/A 02/18/2016   Procedure: LUMBAR 1 KYPHOPLASTY;  Surgeon: Estill Bamberg, MD;  Location: [facility];  Service: Orthopedics;  Laterality: N/A;  LUMBAR 1 KYPHOPLASTY   KYPHOPLASTY N/A 06/04/2021   Procedure: LUMBAR 2  KYPHOPLASTY;  Surgeon: Estill Bamberg, MD;  Location: [facility];  Service: Orthopedics;  Laterality: N/A;   LAPAROSCOPIC APPENDECTOMY N/A 12/09/2021   Procedure: APPENDECTOMY LAPAROSCOPIC;  Surgeon: Andria Meuse, MD;  Location: [facility];  Service: General;  Laterality: N/A;   LUMBAR DISC SURGERY     MODIFIED RADICAL MASTECTOMY W/ AXILLARY LYMPH NODE DISSECTION     Left   RECONSTRUCTION BREAST W/ LATISSIMUS DORSI FLAP     left   REDUCTION MAMMAPLASTY Right    right foot surgery     corrected hammer toe, straightened big toe   SEPTOPLASTY     TOE SURGERY     TONSILLECTOMY AND ADENOIDECTOMY     Patient Active Problem List   Diagnosis Date Noted   Acute appendicitis 12/09/2021   S/P laparoscopic appendectomy 12/09/2021   CAP (community acquired pneumonia) 09/30/2020   Chronic respiratory failure with hypoxia (HCC) 09/23/2020   Allergic rhinitis 08/31/2019   Pilar cyst 08/04/2018   History of breast cancer in female 06/09/2018   History of reconstruction of both breasts 06/09/2018   Severe obesity (BMI >= 40) (HCC) 09/16/2015   Asthma with acute exacerbation 09/15/2015   DOE (dyspnea on exertion)  01/11/2014   Insomnia 05/05/2012   Osteoarthritis    Osteopenia    Collagenous colitis    OSA (obstructive sleep apnea)    Hypothyroidism    Mild persistent asthma    Hypertension    Breast cancer, left breast (HCC)     REFERRING DIAG: M19.90 (ICD-10-CM) - Unspecified osteoarthritis, unspecified site    R07.81 (ICD-10-CM) - Rib pain   THERAPY DIAG:  Chronic pain of both knees  Chronic pain of both shoulders  Rib pain on left side  Muscle weakness (generalized)  Difficulty in walking, not elsewhere classified  Rationale for Evaluation and Treatment Rehabilitation  PERTINENT HISTORY: Hypertension  Anxiety  History of lumbar compression fracture Osteopenia  OA Kyphoplasty 2017 and 2022 Knee arthroscopy 2023   PRECAUTIONS: fall   SUBJECTIVE:                                                                                                                                                                                       SUBJECTIVE STATEMENT: "The left knee is telling me not today. Everything else is much better."    PAIN:  Are you having pain? Yes NPRS scale: 4/10  Pain location: Lt knee  Pain description: "pain" Aggravating factors: walking, standing, transfers  Relieving factors: rest  OBJECTIVE: (objective measures completed at initial evaluation unless otherwise dated)  DIAGNOSTIC FINDINGS:  No recent imaging on file    PATIENT SURVEYS:  FOTO 40% function to 46% predicted  10/28/22: 41%  11/18/22: FOTO 44% function    SCREENING FOR RED FLAGS: Bowel or bladder incontinence: No Spinal tumors: No Cauda equina syndrome: No Compression fracture: No Abdominal aneurysm: No   COGNITION: Overall cognitive status: Within functional limits for tasks assessed                          SENSATION: Not tested   MUSCLE LENGTH: Not tested    POSTURE: slump sitting posture    PALPATION: Not assessed    LUMBAR ROM:    AROM eval 11/18/22  Flexion WNL WNL  Extension 75% limited Lt rib pain 75% limited  Right lateral flexion WNL Lt rib pain WNL  Left lateral flexion WNL Lt rib pain WNL  Right rotation WNL WNL  Left rotation WNL Lt rib pain WNL   (Blank rows = not tested)   LOWER EXTREMITY ROM:      Active  Right eval Left eval  Hip flexion      Hip extension      Hip abduction      Hip adduction      Hip internal rotation  Hip external rotation      Knee flexion 115 100  Knee extension      Ankle dorsiflexion      Ankle plantarflexion      Ankle inversion      Ankle eversion       (Blank rows = not tested)   LOWER EXTREMITY MMT:     MMT Right eval Left eval 11/18/22  Hip flexion 4 4   Hip extension       Hip abduction 3+ 3+ 4- bilateral   Hip adduction       Hip internal rotation       Hip external rotation        Knee flexion 5 5   Knee extension 5 5   Ankle dorsiflexion       Ankle plantarflexion       Ankle inversion       Ankle eversion        (Blank rows = not tested) UPPER EXTREMITY MMT:   MMT Right eval Left eval  Shoulder flexion 4- 4-  Shoulder extension      Shoulder abduction 5 5  Shoulder adduction      Shoulder extension      Shoulder internal rotation 5 5  Shoulder external rotation 5 5  Middle trapezius      Lower trapezius      Elbow flexion      Elbow extension      Wrist flexion      Wrist extension      Wrist ulnar deviation      Wrist radial deviation      Wrist pronation      Wrist supination      Grip strength       (Blank rows = not tested) UPPER EXTREMITY ROM:   Active ROM Right eval Left eval  Shoulder flexion [facility]  Shoulder extension      Shoulder abduction [facility]  Shoulder adduction      Shoulder extension      Shoulder internal rotation L5 L5  Shoulder external rotation C5 C5  Elbow flexion      Elbow extension      Wrist flexion      Wrist extension      Wrist ulnar deviation      Wrist radial deviation      Wrist pronation      Wrist supination       (Blank rows = not tested)   SPECIAL TESTS:  Not tested    FUNCTIONAL TESTS:  30 second sit to stand: 2 reps, BUE support    2 MWT: 200 ft with rollator   10/19/22: 2 MWT: 200 ft with rollator  11/04/22: 30 second sit to stand: 7 reps; UE support for first rep   11/18/22: 2 MWT: 200 ft with rollator   GAIT: Distance walked: 10 ft  Assistive device utilized: Environmental consultant - 4 wheeled Level of assistance: Modified independence Comments: excessive frontal plane movement, decreased step length, limited push-off   TREATMENT [facility] Adult PT Treatment:                                                DATE: 11/25/22 Therapeutic Exercise: NuStep level 5 x 5 minutes UE/LE Walking x 370 ft no rest break Standing bird dog 2 x 10  Step  taps 2 x 10 8 inch  LAQ 2 x 10 @ 4 lbs     [facility] Adult PT  Treatment:                                                DATE: 11/23/22 Therapeutic Exercise: NuStep level 5 x 5 minutes  Standing resisted hip abduction red band 2 x 10 (pain in L knee when standing on LLE) Standing resisted hip extension red band 2 x 10  Standing calf raise 2 x 10  Standing hamstring curl 2x10 Seated hip adduction ball squeeze 5" hold 2x10 Standing march 2x30" Gait rolled into therex with rollator 2x185' ( no rest break in btw today) STS x5 (pain in Lt knee)   [facility] Adult PT Treatment:                                                DATE: 11/18/22 Therapeutic Exercise: Standing resisted hip abduction red band 2 x 10  Standing resisted hip extension red band 2 x 10  Standing calf raise 2 x 10  HEP review   Therapeutic Activity: Re-assessment to determine overall progress, educating patient on progress towards goals.   Self Care: Discussed [facility] wellness program as a continuation of fitness training after PT with brochure provided.    [facility] Adult PT Treatment:                                                DATE: 11/16/22 Therapeutic Exercise: NuStep level 5 x 5 minutes  LAQ 2 x 10 @ 2.5 lbs  Standing hip abduction x 10  Standing hip extension x 10  Standing hamstring curl x 10  Standing march x 10  Standing calf raise x 10  Gait rolled into therex with rollator x185' Seated marching 2x30"  Seated hip adduction ball squeeze 3" hold 2x10    PATIENT EDUCATION:  Education details: HEP review Person educated: Patient Education method: Explanation Education comprehension: verbalized understanding   HOME EXERCISE PROGRAM: Access Code: [email or web address] URL: [email or web address] Date: 10/15/2022 Prepared by: Letitia Libra  Exercises - Supine Lower Trunk Rotation  - 1 x daily - 7 x weekly - 3 sets - 10 reps - Hooklying Single Knee to Chest Stretch  - 1 x daily - 7 x weekly - 3 sets - 10 reps - Sidelying Thoracic Rotation with Open Book  - 1 x daily  - 7 x weekly - 2 sets - 10 reps - Seated Hip Abduction with Resistance  - 1 x daily - 7 x weekly - 2 sets - 10 reps - Seated March with Resistance  - 1 x daily - 7 x weekly - 2 sets - 10 reps - Seated Elbow Flexion with Resistance  - 1 x daily - 7 x weekly - 2 sets - 10 reps - Seated Elbow Extension with Self-Anchored Resistance  - 1 x daily - 7 x weekly - 2 sets - 10 reps   ASSESSMENT:   CLINICAL IMPRESSION: Patient arrives with Lt knee pain, but reports overall improvement in other areas of pain.  She was able to walk 370 ft without rest break today, but did require use of her inhaler at conclusion of walking due to feelings of SOB, which improved with seated rest break and inhaler. Continued with standing strengthening with good tolerance with patient requiring occasional seated rest break.    OBJECTIVE IMPAIRMENTS: Abnormal gait, decreased activity tolerance, decreased balance, decreased endurance, decreased knowledge of condition, difficulty walking, decreased ROM, decreased strength, improper body mechanics, postural dysfunction, and pain.    ACTIVITY LIMITATIONS: carrying, lifting, bending, standing, squatting, transfers, bed mobility, toileting, dressing, reach over head, hygiene/grooming, locomotion level, and caring for others   PARTICIPATION LIMITATIONS: meal prep, cleaning, laundry, driving, shopping, and community activity   PERSONAL FACTORS: Age, Fitness, Past/current experiences, Time since onset of injury/illness/exacerbation, and 3+ comorbidities: see PMH above  are also affecting patient's functional outcome.    REHAB POTENTIAL: Fair chronicity, previous PT   CLINICAL DECISION MAKING: Evolving/moderate complexity   EVALUATION COMPLEXITY: Moderate     GOALS: Goals reviewed with patient? Yes   SHORT TERM GOALS: Target date: 11/09/2022      Patient will be independent and compliant with initial HEP.    Baseline: no time to issue Goal status: met   2.  Patient will be  independent with car transfers.  Baseline: supervision  11/02/22: able to transfer independently Goal status: met   3.  Patient will complete at least 4 reps during 30 second sit to stand to signify improvements in functional strength.  Baseline: see above  Goal status: met   4.  Patient will demonstrate pain free trunk AROM to improve tolerance to reaching/bending activity.  Baseline: see above  Goal status: met   LONG TERM GOALS: Target date: 12/11/2022       Patient will be able to place 2 lbs in overhead shelf with minimal shoulder pain to improve ability to reach and grab items in kitchen shelves.  Baseline: see above 11/18/22: no pain with placing 2 lbs in overhead shelf  Goal status: met   2.  Patient will score at least 46% on FOTO to signify clinically meaningful improvement in functional abilities.    Baseline: see above Goal status: progressing    3.  Patient will walk at least 250 ft during 2 MWT with LRAD to improve tolerance to community ambulation.  Baseline: see above Goal status: ongoing    4.  Patient will demonstrate at least 4/5 bilateral hip abductor strength to improve stability about the chain with walking/standing activity.  Baseline: see above Goal status: progressing    5.  Patient will be independent with advanced home program to assist in management of her chronic condition.  Baseline: see above  Goal status: ongoing    PLAN:   PT FREQUENCY: 1-2x/week   PT DURATION: 8 weeks   PLANNED INTERVENTIONS: Therapeutic exercises, Therapeutic activity, Neuromuscular re-education, Balance training, Gait training, Patient/Family education, Self Care, DME instructions, Cryotherapy, Moist heat, Manual therapy, and Re-evaluation.   PLAN FOR NEXT SESSION:shoulder/hip strengthening, trunk mobility.    Letitia Libra, PT, DPT, ATC 11/25/22 3:30 PM

## 2022-11-26 ENCOUNTER — Telehealth: Payer: Self-pay | Admitting: Pulmonary Disease

## 2022-11-26 NOTE — Telephone Encounter (Signed)
PT calling stating Pharm does not have the RX we sent for Advair on the 21st. Please call PT to advise. She is out and you can hear she is SOB. Her # is [phone]  I verified where we sent it is the correct pharm. TY.

## 2022-11-26 NOTE — Telephone Encounter (Signed)
Advair HFA is not covered , she needs the regular one [facility] has the 215 mg in stock

## 2022-11-26 NOTE — Telephone Encounter (Signed)
Spoke with patient and she needed regular Advair sent to her pharmacy instead of Advair HFA due to insurance reasons. Patient states she can't afford the Advair HFA and has never taken that before. I spoke wit pharmacist at [facility] and they have her regular Advair 100, 1 inhalation x2 daily  ( 90 day supply )for $10. Called patient to ask if that was ok and she stated that was great and nothing further needed.

## 2022-11-30 ENCOUNTER — Ambulatory Visit: Payer: Medicare HMO

## 2022-11-30 DIAGNOSIS — R2689 Other abnormalities of gait and mobility: Secondary | ICD-10-CM

## 2022-11-30 DIAGNOSIS — G8929 Other chronic pain: Secondary | ICD-10-CM

## 2022-11-30 DIAGNOSIS — R262 Difficulty in walking, not elsewhere classified: Secondary | ICD-10-CM

## 2022-11-30 DIAGNOSIS — R0781 Pleurodynia: Secondary | ICD-10-CM

## 2022-11-30 DIAGNOSIS — M25561 Pain in right knee: Secondary | ICD-10-CM | POA: Diagnosis not present

## 2022-11-30 DIAGNOSIS — M6281 Muscle weakness (generalized): Secondary | ICD-10-CM

## 2022-11-30 NOTE — Therapy (Signed)
OUTPATIENT PHYSICAL THERAPY TREATMENT NOTE    Patient Name: [name] MRN: [id number] DOB:[birth year], 82 y.o., female Today's Date: 11/30/2022  PCP: Teena Irani, PA-C  REFERRING PROVIDER: Merryl Hacker, NP   END OF SESSION:   PT End of Session - 11/30/22 1446     Visit Number 13    Number of Visits 17    Date for PT Re-Evaluation 12/11/22    Authorization Type [employer] MCR    Authorization Time Period FOTO V6, V10, kx mod v15    Progress Note Due on Visit 10    PT Start Time 1445    PT Stop Time 1525    PT Time Calculation (min) 40 min    Activity Tolerance Patient tolerated treatment well    Behavior During Therapy WFL for tasks assessed/performed              Past Medical History:  Diagnosis Date   Anemia    PMH;  Patient denies this dx as of 06/03/21   Anxiety    Asthma    Breast cancer (HCC) 1988   left breast cancer   Collagenous colitis    Compression fracture    lumbar 1   Dyspnea    with exertion   Hypertension    Hypothyroidism    Osteoarthritis    Osteopenia    Pneumonia 07/2020   PONV (postoperative nausea and vomiting)    Sleep apnea    wears CPAP nightly   Past Surgical History:  Procedure Laterality Date   BREAST REDUCTION SURGERY     Right   CATARACT EXTRACTION W/ INTRAOCULAR LENS  IMPLANT, BILATERAL     CESAREAN SECTION     COLONOSCOPY W/ BIOPSIES AND POLYPECTOMY     CYST EXCISION N/A 08/30/2018   Procedure: excision of posterior shoulder / back sebaceous cyst;  Surgeon: Peggye Form, DO;  Location: [facility];  Service: Plastics;  Laterality: N/A;   KNEE ARTHROSCOPY Left 02/23/2022   Procedure: LEFT KNEE ARTHROSCOPY;  Surgeon: Marcene Corning, MD;  Location: [facility];  Service: Orthopedics;  Laterality: Left;   KYPHOPLASTY N/A 02/18/2016   Procedure: LUMBAR 1 KYPHOPLASTY;  Surgeon: Estill Bamberg, MD;  Location: [facility];  Service: Orthopedics;  Laterality: N/A;  LUMBAR 1 KYPHOPLASTY   KYPHOPLASTY N/A  06/04/2021   Procedure: LUMBAR 2 KYPHOPLASTY;  Surgeon: Estill Bamberg, MD;  Location: [facility];  Service: Orthopedics;  Laterality: N/A;   LAPAROSCOPIC APPENDECTOMY N/A 12/09/2021   Procedure: APPENDECTOMY LAPAROSCOPIC;  Surgeon: Andria Meuse, MD;  Location: [facility];  Service: General;  Laterality: N/A;   LUMBAR DISC SURGERY     MODIFIED RADICAL MASTECTOMY W/ AXILLARY LYMPH NODE DISSECTION     Left   RECONSTRUCTION BREAST W/ LATISSIMUS DORSI FLAP     left   REDUCTION MAMMAPLASTY Right    right foot surgery     corrected hammer toe, straightened big toe   SEPTOPLASTY     TOE SURGERY     TONSILLECTOMY AND ADENOIDECTOMY     Patient Active Problem List   Diagnosis Date Noted   Acute appendicitis 12/09/2021   S/P laparoscopic appendectomy 12/09/2021   CAP (community acquired pneumonia) 09/30/2020   Chronic respiratory failure with hypoxia (HCC) 09/23/2020   Allergic rhinitis 08/31/2019   Pilar cyst 08/04/2018   History of breast cancer in female 06/09/2018   History of reconstruction of both breasts 06/09/2018   Severe obesity (BMI >= 40) (HCC) 09/16/2015   Asthma with acute exacerbation  09/15/2015   DOE (dyspnea on exertion) 01/11/2014   Insomnia 05/05/2012   Osteoarthritis    Osteopenia    Collagenous colitis    OSA (obstructive sleep apnea)    Hypothyroidism    Mild persistent asthma    Hypertension    Breast cancer, left breast (HCC)     REFERRING DIAG: M19.90 (ICD-10-CM) - Unspecified osteoarthritis, unspecified site    R07.81 (ICD-10-CM) - Rib pain   THERAPY DIAG:  Chronic pain of both knees  Chronic pain of both shoulders  Rib pain on left side  Muscle weakness (generalized)  Difficulty in walking, not elsewhere classified  Chronic pain of left knee  Other abnormalities of gait and mobility  Rationale for Evaluation and Treatment Rehabilitation  PERTINENT HISTORY: Hypertension  Anxiety  History of lumbar compression fracture Osteopenia   OA Kyphoplasty 2017 and 2022 Knee arthroscopy 2023   PRECAUTIONS: fall   SUBJECTIVE:                                                                                                                                                                                      SUBJECTIVE STATEMENT: Patient reports her Lt knee is better today, and that her asthma has also improved.   PAIN:  Are you having pain? Yes NPRS scale: 2-3/10  Pain location: Lt knee  Pain description: "pain" Aggravating factors: walking, standing, transfers  Relieving factors: rest  OBJECTIVE: (objective measures completed at initial evaluation unless otherwise dated)  DIAGNOSTIC FINDINGS:  No recent imaging on file    PATIENT SURVEYS:  FOTO 40% function to 46% predicted  10/28/22: 41%  11/18/22: FOTO 44% function    SCREENING FOR RED FLAGS: Bowel or bladder incontinence: No Spinal tumors: No Cauda equina syndrome: No Compression fracture: No Abdominal aneurysm: No   COGNITION: Overall cognitive status: Within functional limits for tasks assessed                          SENSATION: Not tested   MUSCLE LENGTH: Not tested    POSTURE: slump sitting posture    PALPATION: Not assessed    LUMBAR ROM:    AROM eval 11/18/22  Flexion WNL WNL  Extension 75% limited Lt rib pain 75% limited  Right lateral flexion WNL Lt rib pain WNL  Left lateral flexion WNL Lt rib pain WNL  Right rotation WNL WNL  Left rotation WNL Lt rib pain WNL   (Blank rows = not tested)   LOWER EXTREMITY ROM:      Active  Right eval Left eval  Hip flexion      Hip extension  Hip abduction      Hip adduction      Hip internal rotation      Hip external rotation      Knee flexion 115 100  Knee extension      Ankle dorsiflexion      Ankle plantarflexion      Ankle inversion      Ankle eversion       (Blank rows = not tested)   LOWER EXTREMITY MMT:     MMT Right eval Left eval 11/18/22  Hip flexion 4 4   Hip  extension       Hip abduction 3+ 3+ 4- bilateral   Hip adduction       Hip internal rotation       Hip external rotation       Knee flexion 5 5   Knee extension 5 5   Ankle dorsiflexion       Ankle plantarflexion       Ankle inversion       Ankle eversion        (Blank rows = not tested) UPPER EXTREMITY MMT:   MMT Right eval Left eval  Shoulder flexion 4- 4-  Shoulder extension      Shoulder abduction 5 5  Shoulder adduction      Shoulder extension      Shoulder internal rotation 5 5  Shoulder external rotation 5 5  Middle trapezius      Lower trapezius      Elbow flexion      Elbow extension      Wrist flexion      Wrist extension      Wrist ulnar deviation      Wrist radial deviation      Wrist pronation      Wrist supination      Grip strength       (Blank rows = not tested) UPPER EXTREMITY ROM:   Active ROM Right eval Left eval  Shoulder flexion [facility]  Shoulder extension      Shoulder abduction [facility]  Shoulder adduction      Shoulder extension      Shoulder internal rotation L5 L5  Shoulder external rotation C5 C5  Elbow flexion      Elbow extension      Wrist flexion      Wrist extension      Wrist ulnar deviation      Wrist radial deviation      Wrist pronation      Wrist supination       (Blank rows = not tested)   SPECIAL TESTS:  Not tested    FUNCTIONAL TESTS:  30 second sit to stand: 2 reps, BUE support    2 MWT: 200 ft with rollator   10/19/22: 2 MWT: 200 ft with rollator  11/04/22: 30 second sit to stand: 7 reps; UE support for first rep   11/18/22: 2 MWT: 200 ft with rollator   GAIT: Distance walked: 10 ft  Assistive device utilized: Walker - 4 wheeled Level of assistance: Modified independence Comments: excessive frontal plane movement, decreased step length, limited push-off   TREATMENT [facility] Adult PT Treatment:                                                DATE: 11/30/22 Therapeutic Exercise:  NuStep level 5 x 5  minutes  UE/LE Walking x 370 ft no rest break Standing bird dog 2 x 10  Step taps 2 x 10 8 inch  LAQ 3x15 BIL (as a break in btw standing exercises) Seated marching x1' STS x10 (one short seated break)   [facility] Adult PT Treatment:                                                DATE: 11/25/22 Therapeutic Exercise: NuStep level 5 x 5 minutes UE/LE Walking x 370 ft no rest break Standing bird dog 2 x 10  Step taps 2 x 10 8 inch  LAQ 2 x 10 @ 4 lbs     [facility] Adult PT Treatment:                                                DATE: 11/23/22 Therapeutic Exercise: NuStep level 5 x 5 minutes  Standing resisted hip abduction red band 2 x 10 (pain in L knee when standing on LLE) Standing resisted hip extension red band 2 x 10  Standing calf raise 2 x 10  Standing hamstring curl 2x10 Seated hip adduction ball squeeze 5" hold 2x10 Standing march 2x30" Gait rolled into therex with rollator 2x185' ( no rest break in btw today) STS x5 (pain in Lt knee)    PATIENT EDUCATION:  Education details: HEP review Person educated: Patient Education method: Explanation Education comprehension: verbalized understanding   HOME EXERCISE PROGRAM: Access Code: [email or web address] URL: [email or web address] Date: 10/15/2022 Prepared by: Letitia Libra  Exercises - Supine Lower Trunk Rotation  - 1 x daily - 7 x weekly - 3 sets - 10 reps - Hooklying Single Knee to Chest Stretch  - 1 x daily - 7 x weekly - 3 sets - 10 reps - Sidelying Thoracic Rotation with Open Book  - 1 x daily - 7 x weekly - 2 sets - 10 reps - Seated Hip Abduction with Resistance  - 1 x daily - 7 x weekly - 2 sets - 10 reps - Seated March with Resistance  - 1 x daily - 7 x weekly - 2 sets - 10 reps - Seated Elbow Flexion with Resistance  - 1 x daily - 7 x weekly - 2 sets - 10 reps - Seated Elbow Extension with Self-Anchored Resistance  - 1 x daily - 7 x weekly - 2 sets - 10 reps   ASSESSMENT:   CLINICAL IMPRESSION: Patient presents to PT  reporting improved knee pain and asthma symptoms today. Session today continued to focus on LE strengthening and improving standing activity tolerance. Patient was able to tolerate all prescribed exercises with no adverse effects, needing occasional rest breaks between standing exercises. Patient continues to benefit from skilled PT services and should be progressed as able to improve functional independence.     OBJECTIVE IMPAIRMENTS: Abnormal gait, decreased activity tolerance, decreased balance, decreased endurance, decreased knowledge of condition, difficulty walking, decreased ROM, decreased strength, improper body mechanics, postural dysfunction, and pain.    ACTIVITY LIMITATIONS: carrying, lifting, bending, standing, squatting, transfers, bed mobility, toileting, dressing, reach over head, hygiene/grooming, locomotion level, and caring for others   PARTICIPATION LIMITATIONS: meal prep, cleaning, laundry, driving,  shopping, and community activity   PERSONAL FACTORS: Age, Fitness, Past/current experiences, Time since onset of injury/illness/exacerbation, and 3+ comorbidities: see PMH above  are also affecting patient's functional outcome.    REHAB POTENTIAL: Fair chronicity, previous PT   CLINICAL DECISION MAKING: Evolving/moderate complexity   EVALUATION COMPLEXITY: Moderate     GOALS: Goals reviewed with patient? Yes   SHORT TERM GOALS: Target date: 11/09/2022      Patient will be independent and compliant with initial HEP.    Baseline: no time to issue Goal status: met   2.  Patient will be independent with car transfers.  Baseline: supervision  11/02/22: able to transfer independently Goal status: met   3.  Patient will complete at least 4 reps during 30 second sit to stand to signify improvements in functional strength.  Baseline: see above  Goal status: met   4.  Patient will demonstrate pain free trunk AROM to improve tolerance to reaching/bending activity.  Baseline:  see above  Goal status: met   LONG TERM GOALS: Target date: 12/11/2022       Patient will be able to place 2 lbs in overhead shelf with minimal shoulder pain to improve ability to reach and grab items in kitchen shelves.  Baseline: see above 11/18/22: no pain with placing 2 lbs in overhead shelf  Goal status: met   2.  Patient will score at least 46% on FOTO to signify clinically meaningful improvement in functional abilities.    Baseline: see above Goal status: progressing    3.  Patient will walk at least 250 ft during 2 MWT with LRAD to improve tolerance to community ambulation.  Baseline: see above Goal status: ongoing    4.  Patient will demonstrate at least 4/5 bilateral hip abductor strength to improve stability about the chain with walking/standing activity.  Baseline: see above Goal status: progressing    5.  Patient will be independent with advanced home program to assist in management of her chronic condition.  Baseline: see above  Goal status: ongoing    PLAN:   PT FREQUENCY: 1-2x/week   PT DURATION: 8 weeks   PLANNED INTERVENTIONS: Therapeutic exercises, Therapeutic activity, Neuromuscular re-education, Balance training, Gait training, Patient/Family education, Self Care, DME instructions, Cryotherapy, Moist heat, Manual therapy, and Re-evaluation.   PLAN FOR NEXT SESSION:shoulder/hip strengthening, trunk mobility.    Berta Minor PTA 11/30/22 3:26 PM

## 2022-12-02 ENCOUNTER — Ambulatory Visit: Payer: Medicare HMO

## 2022-12-02 DIAGNOSIS — R262 Difficulty in walking, not elsewhere classified: Secondary | ICD-10-CM

## 2022-12-02 DIAGNOSIS — M6281 Muscle weakness (generalized): Secondary | ICD-10-CM

## 2022-12-02 DIAGNOSIS — R0781 Pleurodynia: Secondary | ICD-10-CM

## 2022-12-02 DIAGNOSIS — M25561 Pain in right knee: Secondary | ICD-10-CM | POA: Diagnosis not present

## 2022-12-02 DIAGNOSIS — G8929 Other chronic pain: Secondary | ICD-10-CM

## 2022-12-02 NOTE — Therapy (Addendum)
OUTPATIENT PHYSICAL THERAPY TREATMENT NOTE  PHYSICAL THERAPY DISCHARGE SUMMARY  Visits from Start of Care: 14  Current functional level related to goals / functional outcomes: See goals below   Remaining deficits: Status unknown   Education / Equipment: N/A   Patient agrees to discharge. Patient goals were partially met. Patient is being discharged due to not returning since the last visit.     Patient Name: [name] MRN: [id number] DOB:08-Jun-1941, 82 y.o., female Today's Date: 12/02/2022  PCP: Lucila Maine  REFERRING PROVIDER: Merryl Hacker, NP   END OF SESSION:   PT End of Session - 12/02/22 1233     Visit Number 14    Number of Visits 17    Date for PT Re-Evaluation 12/11/22    Authorization Type [employer] MCR    Authorization Time Period FOTO V6, V10, kx mod v15    PT Start Time 1232    PT Stop Time 1315    PT Time Calculation (min) 43 min    Activity Tolerance Patient tolerated treatment well    Behavior During Therapy WFL for tasks assessed/performed              Past Medical History:  Diagnosis Date   Anemia    PMH;  Patient denies this dx as of 06/03/21   Anxiety    Asthma    Breast cancer (HCC) 1988   left breast cancer   Collagenous colitis    Compression fracture    lumbar 1   Dyspnea    with exertion   Hypertension    Hypothyroidism    Osteoarthritis    Osteopenia    Pneumonia 07/2020   PONV (postoperative nausea and vomiting)    Sleep apnea    wears CPAP nightly   Past Surgical History:  Procedure Laterality Date   BREAST REDUCTION SURGERY     Right   CATARACT EXTRACTION W/ INTRAOCULAR LENS  IMPLANT, BILATERAL     CESAREAN SECTION     COLONOSCOPY W/ BIOPSIES AND POLYPECTOMY     CYST EXCISION N/A 08/30/2018   Procedure: excision of posterior shoulder / back sebaceous cyst;  Surgeon: Peggye Form, DO;  Location: [facility];  Service: Plastics;  Laterality: N/A;   KNEE ARTHROSCOPY Left  02/23/2022   Procedure: LEFT KNEE ARTHROSCOPY;  Surgeon: Marcene Corning, MD;  Location: [facility];  Service: Orthopedics;  Laterality: Left;   KYPHOPLASTY N/A 02/18/2016   Procedure: LUMBAR 1 KYPHOPLASTY;  Surgeon: Estill Bamberg, MD;  Location: [facility];  Service: Orthopedics;  Laterality: N/A;  LUMBAR 1 KYPHOPLASTY   KYPHOPLASTY N/A 06/04/2021   Procedure: LUMBAR 2 KYPHOPLASTY;  Surgeon: Estill Bamberg, MD;  Location: [facility];  Service: Orthopedics;  Laterality: N/A;   LAPAROSCOPIC APPENDECTOMY N/A 12/09/2021   Procedure: APPENDECTOMY LAPAROSCOPIC;  Surgeon: Andria Meuse, MD;  Location: [facility];  Service: General;  Laterality: N/A;   LUMBAR DISC SURGERY     MODIFIED RADICAL MASTECTOMY W/ AXILLARY LYMPH NODE DISSECTION     Left   RECONSTRUCTION BREAST W/ LATISSIMUS DORSI FLAP     left   REDUCTION MAMMAPLASTY Right    right foot surgery     corrected hammer toe, straightened big toe   SEPTOPLASTY     TOE SURGERY     TONSILLECTOMY AND ADENOIDECTOMY     Patient Active Problem List   Diagnosis Date Noted   Acute appendicitis 12/09/2021   S/P laparoscopic appendectomy 12/09/2021   CAP (community acquired pneumonia) 09/30/2020  Chronic respiratory failure with hypoxia (HCC) 09/23/2020   Allergic rhinitis 08/31/2019   Pilar cyst 08/04/2018   History of breast cancer in female 06/09/2018   History of reconstruction of both breasts 06/09/2018   Severe obesity (BMI >= 40) (HCC) 09/16/2015   Asthma with acute exacerbation 09/15/2015   DOE (dyspnea on exertion) 01/11/2014   Insomnia 05/05/2012   Osteoarthritis    Osteopenia    Collagenous colitis    OSA (obstructive sleep apnea)    Hypothyroidism    Mild persistent asthma    Hypertension    Breast cancer, left breast (HCC)     REFERRING DIAG: M19.90 (ICD-10-CM) - Unspecified osteoarthritis, unspecified site    R07.81 (ICD-10-CM) - Rib pain   THERAPY DIAG:  Chronic pain of both knees  Chronic pain of both shoulders  Rib pain on  left side  Muscle weakness (generalized)  Difficulty in walking, not elsewhere classified  Rationale for Evaluation and Treatment Rehabilitation  PERTINENT HISTORY: Hypertension  Anxiety  History of lumbar compression fracture Osteopenia  OA Kyphoplasty 2017 and 2022 Knee arthroscopy 2023   PRECAUTIONS: fall   SUBJECTIVE:                                                                                                                                                                                      SUBJECTIVE STATEMENT: "I'm doing a little bit better. The knee is like a 1."    PAIN:  Are you having pain? Yes NPRS scale: 1/10  Pain location: Lt knee  Pain description: "pain" Aggravating factors: walking, standing, transfers  Relieving factors: rest  OBJECTIVE: (objective measures completed at initial evaluation unless otherwise dated)  DIAGNOSTIC FINDINGS:  No recent imaging on file    PATIENT SURVEYS:  FOTO 40% function to 46% predicted  10/28/22: 41%  11/18/22: FOTO 44% function   12/02/22: FOTO 59% function    SCREENING FOR RED FLAGS: Bowel or bladder incontinence: No Spinal tumors: No Cauda equina syndrome: No Compression fracture: No Abdominal aneurysm: No   COGNITION: Overall cognitive status: Within functional limits for tasks assessed                          SENSATION: Not tested   MUSCLE LENGTH: Not tested    POSTURE: slump sitting posture    PALPATION: Not assessed    LUMBAR ROM:    AROM eval 11/18/22  Flexion WNL WNL  Extension 75% limited Lt rib pain 75% limited  Right lateral flexion WNL Lt rib pain WNL  Left lateral flexion WNL Lt rib pain WNL  Right rotation WNL WNL  Left rotation  WNL Lt rib pain WNL   (Blank rows = not tested)   LOWER EXTREMITY ROM:      Active  Right eval Left eval  Hip flexion      Hip extension      Hip abduction      Hip adduction      Hip internal rotation      Hip external rotation      Knee  flexion 115 100  Knee extension      Ankle dorsiflexion      Ankle plantarflexion      Ankle inversion      Ankle eversion       (Blank rows = not tested)   LOWER EXTREMITY MMT:     MMT Right eval Left eval 11/18/22  Hip flexion 4 4   Hip extension       Hip abduction 3+ 3+ 4- bilateral   Hip adduction       Hip internal rotation       Hip external rotation       Knee flexion 5 5   Knee extension 5 5   Ankle dorsiflexion       Ankle plantarflexion       Ankle inversion       Ankle eversion        (Blank rows = not tested) UPPER EXTREMITY MMT:   MMT Right eval Left eval  Shoulder flexion 4- 4-  Shoulder extension      Shoulder abduction 5 5  Shoulder adduction      Shoulder extension      Shoulder internal rotation 5 5  Shoulder external rotation 5 5  Middle trapezius      Lower trapezius      Elbow flexion      Elbow extension      Wrist flexion      Wrist extension      Wrist ulnar deviation      Wrist radial deviation      Wrist pronation      Wrist supination      Grip strength       (Blank rows = not tested) UPPER EXTREMITY ROM:   Active ROM Right eval Left eval  Shoulder flexion [facility]  Shoulder extension      Shoulder abduction [facility]  Shoulder adduction      Shoulder extension      Shoulder internal rotation L5 L5  Shoulder external rotation C5 C5  Elbow flexion      Elbow extension      Wrist flexion      Wrist extension      Wrist ulnar deviation      Wrist radial deviation      Wrist pronation      Wrist supination       (Blank rows = not tested)   SPECIAL TESTS:  Not tested    FUNCTIONAL TESTS:  30 second sit to stand: 2 reps, BUE support    2 MWT: 200 ft with rollator   10/19/22: 2 MWT: 200 ft with rollator  11/04/22: 30 second sit to stand: 7 reps; UE support for first rep   11/18/22: 2 MWT: 200 ft with rollator   GAIT: Distance walked: 10 ft  Assistive device utilized: Environmental consultant - 4 wheeled Level of assistance: Modified  independence Comments: excessive frontal plane movement, decreased step length, limited push-off   TREATMENT [facility] Adult PT Treatment:  DATE: 12/02/22 Therapeutic Exercise: NuStep level 5 x 5 minutes  Standing HS curl 2.5# single UE support 2 x 10  Standing march 2.5# single UE support 2 x 10  Standing hip extension 2.5# single UE support 2 x 10  Standing hip abduction 2.5# single UE support 2 x 10  Side stepping in // bars 2 sets d/b 2.5#  Standing toe raises 2 x 10     [facility] Adult PT Treatment:                                                DATE: 11/30/22 Therapeutic Exercise: NuStep level 5 x 5  minutes UE/LE Walking x 370 ft no rest break Standing bird dog 2 x 10  Step taps 2 x 10 8 inch  LAQ 3x15 BIL (as a break in btw standing exercises) Seated marching x1' STS x10 (one short seated break)   [facility] Adult PT Treatment:                                                DATE: 11/25/22 Therapeutic Exercise: NuStep level 5 x 5 minutes UE/LE Walking x 370 ft no rest break Standing bird dog 2 x 10  Step taps 2 x 10 8 inch  LAQ 2 x 10 @ 4 lbs     [facility] Adult PT Treatment:                                                DATE: 11/23/22 Therapeutic Exercise: NuStep level 5 x 5 minutes  Standing resisted hip abduction red band 2 x 10 (pain in L knee when standing on LLE) Standing resisted hip extension red band 2 x 10  Standing calf raise 2 x 10  Standing hamstring curl 2x10 Seated hip adduction ball squeeze 5" hold 2x10 Standing march 2x30" Gait rolled into therex with rollator 2x185' ( no rest break in btw today) STS x5 (pain in Lt knee)    PATIENT EDUCATION:  Education details: HEP review Person educated: Patient Education method: Explanation Education comprehension: verbalized understanding   HOME EXERCISE PROGRAM: Access Code: [email or web address] URL: [email or web address] Date: 10/15/2022 Prepared by: Letitia Libra  Exercises - Supine Lower Trunk Rotation  - 1 x daily - 7 x weekly - 3 sets - 10 reps - Hooklying Single Knee to Chest Stretch  - 1 x daily - 7 x weekly - 3 sets - 10 reps - Sidelying Thoracic Rotation with Open Book  - 1 x daily - 7 x weekly - 2 sets - 10 reps - Seated Hip Abduction with Resistance  - 1 x daily - 7 x weekly - 2 sets - 10 reps - Seated March with Resistance  - 1 x daily - 7 x weekly - 2 sets - 10 reps - Seated Elbow Flexion with Resistance  - 1 x daily - 7 x weekly - 2 sets - 10 reps - Seated Elbow Extension with Self-Anchored Resistance  - 1 x daily - 7 x weekly - 2 sets - 10 reps   ASSESSMENT:   CLINICAL IMPRESSION:  Patient presents to PT reporting improvements in her Lt knee pain. Able to progress standing strengthening having patient complete with single UE support. She was initially apprehensive to perform with single UE support, but demonstrates good stability and no LOB. Her FOTO score has significantly improved, having met this LTG. No reports of increased pain throughout session.     OBJECTIVE IMPAIRMENTS: Abnormal gait, decreased activity tolerance, decreased balance, decreased endurance, decreased knowledge of condition, difficulty walking, decreased ROM, decreased strength, improper body mechanics, postural dysfunction, and pain.    ACTIVITY LIMITATIONS: carrying, lifting, bending, standing, squatting, transfers, bed mobility, toileting, dressing, reach over head, hygiene/grooming, locomotion level, and caring for others   PARTICIPATION LIMITATIONS: meal prep, cleaning, laundry, driving, shopping, and community activity   PERSONAL FACTORS: Age, Fitness, Past/current experiences, Time since onset of injury/illness/exacerbation, and 3+ comorbidities: see PMH above  are also affecting patient's functional outcome.    REHAB POTENTIAL: Fair chronicity, previous PT   CLINICAL DECISION MAKING: Evolving/moderate complexity   EVALUATION COMPLEXITY: Moderate      GOALS: Goals reviewed with patient? Yes   SHORT TERM GOALS: Target date: 11/09/2022      Patient will be independent and compliant with initial HEP.    Baseline: no time to issue Goal status: met   2.  Patient will be independent with car transfers.  Baseline: supervision  11/02/22: able to transfer independently Goal status: met   3.  Patient will complete at least 4 reps during 30 second sit to stand to signify improvements in functional strength.  Baseline: see above  Goal status: met   4.  Patient will demonstrate pain free trunk AROM to improve tolerance to reaching/bending activity.  Baseline: see above  Goal status: met   LONG TERM GOALS: Target date: 12/11/2022       Patient will be able to place 2 lbs in overhead shelf with minimal shoulder pain to improve ability to reach and grab items in kitchen shelves.  Baseline: see above 11/18/22: no pain with placing 2 lbs in overhead shelf  Goal status: met   2.  Patient will score at least 46% on FOTO to signify clinically meaningful improvement in functional abilities.    Baseline: see above Goal status: met    3.  Patient will walk at least 250 ft during 2 MWT with LRAD to improve tolerance to community ambulation.  Baseline: see above Goal status: ongoing    4.  Patient will demonstrate at least 4/5 bilateral hip abductor strength to improve stability about the chain with walking/standing activity.  Baseline: see above Goal status: progressing    5.  Patient will be independent with advanced home program to assist in management of her chronic condition.  Baseline: see above  Goal status: ongoing    PLAN:   PT FREQUENCY: N/A   PT DURATION: N/A   PLANNED INTERVENTIONS: Therapeutic exercises, Therapeutic activity, Neuromuscular re-education, Balance training, Gait training, Patient/Family education, Self Care, DME instructions, Cryotherapy, Moist heat, Manual therapy, and Re-evaluation.   PLAN FOR NEXT SESSION:  N/A  Letitia Libra, PT, DPT, ATC 12/02/22 1:18 PM  Letitia Libra, PT, DPT, ATC 01/17/23 9:26 AM

## 2022-12-07 ENCOUNTER — Ambulatory Visit: Payer: Medicare HMO

## 2022-12-09 ENCOUNTER — Ambulatory Visit: Payer: Medicare HMO

## 2022-12-14 ENCOUNTER — Telehealth: Payer: Self-pay

## 2022-12-14 NOTE — Telephone Encounter (Signed)
Returned patients call. She reports that she has been very sick for a couple of weeks and that her doctors have not been able to identify the cause. She states that she has nausea medication that she can take only every 8 hours which hasn't been very helpful. She says the next step is that they have recommended a barium swallow and she is just waiting for this to get scheduled. She is agreeable to pausing PT for now until this is figured out. She will call to schedule appointments before 6/30 is able, and understands that after this date she will need a new referral.   Berta Minor, PTA 12/14/22 5:31 PM

## 2022-12-23 ENCOUNTER — Ambulatory Visit (INDEPENDENT_AMBULATORY_CARE_PROVIDER_SITE_OTHER): Payer: Medicare HMO | Admitting: Pulmonary Disease

## 2022-12-23 ENCOUNTER — Encounter (HOSPITAL_BASED_OUTPATIENT_CLINIC_OR_DEPARTMENT_OTHER): Payer: Self-pay | Admitting: Pulmonary Disease

## 2022-12-23 VITALS — BP 118/68 | HR 75 | Ht 61.0 in | Wt 230.0 lb

## 2022-12-23 DIAGNOSIS — G4733 Obstructive sleep apnea (adult) (pediatric): Secondary | ICD-10-CM | POA: Diagnosis not present

## 2022-12-23 DIAGNOSIS — J4530 Mild persistent asthma, uncomplicated: Secondary | ICD-10-CM

## 2022-12-23 DIAGNOSIS — J4540 Moderate persistent asthma, uncomplicated: Secondary | ICD-10-CM

## 2022-12-23 MED ORDER — ALBUTEROL SULFATE HFA 108 (90 BASE) MCG/ACT IN AERS: 2.0000 | INHALATION_SPRAY | Freq: Four times a day (QID) | RESPIRATORY_TRACT | 3 refills | Status: AC | PRN

## 2022-12-23 NOTE — Progress Notes (Signed)
[facility]  Chief Complaint  Patient presents with   Follow-up    31yr f/u for asthma and OSA. States the chinstrap she has is uncomfortable. Uses [facility] as DME.     Constitutional:  BP 118/68   Pulse 75   Ht 5\' 1"  (1.549 m)   Wt 230 lb (104.3 kg)   SpO2 97% Comment: on RA  BMI 43.46 kg/m   Past Medical History:  Anemia, Anxiety, Breast cancer 1988, Collagenous colitis, L1 compression fracture, HTN, Hypothyroidism, OA, Osteopenia, Diastolic CHF, CKD 3a, COVID 23 July 2020  Past Surgical History:  Her  has a past surgical history that includes Modified radical mastectomy w/ axillary lymph node dissection; Breast reduction surgery; Toe Surgery; Septoplasty; Cesarean section; Tonsillectomy and adenoidectomy; right foot surgery; Reconstruction breast w/ latissimus dorsi flap; Colonoscopy w/ biopsies and polypectomy; Cataract extraction w/ intraocular lens  implant, bilateral; Kyphoplasty (N/A, 02/18/2016); Cyst excision (N/A, 08/30/2018); Reduction mammaplasty (Right); Kyphoplasty (N/A, 06/04/2021); laparoscopic appendectomy (N/A, 12/09/2021); Lumbar disc surgery; and Knee arthroscopy (Left, 02/23/2022).  Brief Summary:  [name] is a 82 y.o. female with obstructive sleep apnea and asthma.     Subjective:   She had left knee surgery in August of last year.   She has trouble with nausea for the past 3 months.  Had esophagus xray scheduled in [place].  Not having cough, wheeze, or chest congestion.  Uses CPAP nightly.  Pressure is comfortable.  Needs a new chin strap.   Physical Exam:   Appearance - well kempt, uses a walker  ENMT - no sinus tenderness, no oral exudate, no LAN, Mallampati 3 airway, no stridor  Respiratory - equal breath sounds bilaterally, no wheezing or rales  CV - s1s2 regular rate and rhythm, no murmurs  Ext - no clubbing, no edema  Skin - no rashes  Psych - normal mood and affect      Pulmonary Tests:  Spirometry 06/06/12>>FEV1 1.48 (69%), FVC 1.95 (70%), FEV1% 76 PFT 07/27/12 >> FEV1 1.71 (88%), FEV1% 77, FEF 25-75% 1.44 (64%), TLC 3.58 (77%), DLCO 72%, no BD. Spirometry 10/24/17 >> FEV1 1.15 (63%), FEV1% 83 FeNO 10/24/17 >> 19  Chest imaging:  CT angio chest 09/23/20 >> GGO b/l with patchy ASD LUL HRCT 02/11/21 >> mild irregular peripheral interstitial opacity and some septal thickening at lung bases, air trapping (indeterminate for UIP)  Sleep Tests:  PSG 12/01/06>>AHI 10, SpO2 low 80%, PLMI 0  CPAP titration 02/16/07>>CPAP 11 cm H2O CPAP 11/22/22 to 12/21/22 >> used on 30 of 30 nights with average 11 hrs 6 min.  Average AHI 1.3 with CPAP 11 cm H2O  Cardiac Tests:  Echo 06/22/21 >> EF 57%  Social History:  She  reports that she has never smoked. She has never used smokeless tobacco. She reports current alcohol use of about 1.0 standard drink of alcohol per week. She reports that she does not use drugs.  Family History:  Her family history includes Angina (age of onset: 29) in her brother; Brain cancer in her maternal aunt; Breast cancer in her maternal aunt and another family member; Hypertension in her brother, father, and mother; Kidney cancer in her paternal uncle and another family member; Lung cancer in her mother; Ovarian cancer in her sister and another family member; Prostate cancer in her paternal uncle.     Assessment/Plan:   Obstructive sleep apnea. - she is compliant with CPAP and reports benefit - she uses [facility] for her DME -  her current CPAP is more than 80 yrs old - will arrange for a new Resmed 11 CPAP at 11 cm H2O - will arrange for refitting of her CPAP mask and chin strap  Moderate persistent asthma. - symbicort and advair HFA weren't covered by insurance - continue advair 250 one puff bid - prn albuterol - she has a nebulizer  Chronic diastolic CHF. - followed by Dr. Jacinto Halim  Time Spent Involved in Patient Care on Day of Examination:  27  minutes  Follow up:   Patient Instructions  Will arrange for a new CPAP machine and refitting of your mask chin strap  Follow up in 4 months   Medication List:   Allergies as of 12/23/2022       Reactions   Nebivolol Hcl Other (See Comments)   Hypotensive episode   Bactrim [sulfamethoxazole-trimethoprim] Other (See Comments)   UNSPECIFIED   Dairy Aid [tilactase] Diarrhea, Other (See Comments)   Ice cream, sour cream   Lactose Intolerance (gi) Diarrhea   Microplegia Msa-msg [cardioplegia [place] Formula] Other (See Comments)   Unknown reaction    Adhesive [tape] Rash   Meloxicam Nausea Only   Other Nausea And Vomiting   general anesthesia   Sulfa Antibiotics Rash           Medication List        Accurate as of December 23, 2022  2:09 PM. If you have any questions, ask your nurse or doctor.          STOP taking these medications    diphenhydrAMINE 50 MG tablet Commonly known as: BENADRYL Stopped by: [name], MD   KRILL OIL PO Stopped by: [name], MD   magnesium oxide 400 (240 Mg) MG tablet Commonly known as: MAG-OX Stopped by: [name], MD   methocarbamol 500 MG tablet Commonly known as: ROBAXIN Stopped by: [name], MD   naproxen sodium 220 MG tablet Commonly known as: ALEVE Stopped by: [name], MD       TAKE these medications    albuterol (2.5 MG/3ML) 0.083% nebulizer solution Commonly known as: PROVENTIL Take 3 mLs (2.5 mg dose) by nebulization every 6 (six) hours as needed for Wheezing. What changed: Another medication with the same name was changed. Make sure you understand how and when to take each. Changed by: [name], MD   albuterol 108 (90 Base) MCG/ACT inhaler Commonly known as: VENTOLIN HFA Inhale 2 puffs into the lungs every 6 (six) hours as needed for wheezing or shortness of breath. What changed: See the new instructions. Changed by: [name], MD   B-complex with vitamin C tablet Take 1 tablet by mouth  in the morning.   betamethasone dipropionate 0.05 % ointment Commonly known as: DIPROLENE Apply topically 2 (two) times daily.   CALTRATE 600+D3 PO Take 1 tablet by mouth in the morning and at bedtime.   clobetasol ointment 0.05 % Commonly known as: TEMOVATE Apply 1 application topically 2 (two) times daily as needed (irritation).   diclofenac sodium 1 % Gel Commonly known as: VOLTAREN Apply 1 application  topically 4 (four) times daily as needed (pain.).   diltiazem 240 MG 24 hr capsule Commonly known as: CARDIZEM CD Take 240 mg by mouth in the morning.   fluticasone-salmeterol 250-50 MCG/ACT Aepb Commonly known as: ADVAIR Inhale 1 puff into the lungs in the morning and at bedtime. What changed: Another medication with the same name was removed. Continue taking this medication, and follow the directions you  see here. Changed by: [name], MD   furosemide 40 MG tablet Commonly known as: LASIX Take 40 mg by mouth daily.   hydrALAZINE 50 MG tablet Commonly known as: APRESOLINE TAKE ONE TABLET BY MOUTH THREE TIMES A DAY   levothyroxine 88 MCG tablet Commonly known as: SYNTHROID Take 88 mcg by mouth daily before breakfast.   losartan 100 MG tablet Commonly known as: COZAAR Take 100 mg by mouth in the morning.   multivitamin with minerals tablet Take 1 tablet by mouth daily at 12 noon. Centrum Silver for Women   NON FORMULARY Pt uses a cpap nightly   OrthoVisc 30 MG/2ML Sosy intra-articular injection Generic drug: Hyaluronan Inject 6 mLs into the skin every 6 (six) months.   potassium chloride SA 20 MEQ tablet Commonly known as: Klor-Con M20 Take 1 tablet (20 mEq total) by mouth 3 (three) times daily.   Prolia 60 MG/ML Sosy injection Generic drug: denosumab Inject 60 mg into the skin every 6 (six) months.   sertraline 100 MG tablet Commonly known as: ZOLOFT Take 100 mg by mouth 2 (two) times daily.   Systane 0.4-0.3 % Soln Generic drug: Polyethyl  Glycol-Propyl Glycol Place 1-2 drops into both eyes in the morning and at bedtime.   traMADol 50 MG tablet Commonly known as: ULTRAM Take 50 mg by mouth 3 (three) times daily as needed.   ZZZQUIL PO Take 3 tablets by mouth at bedtime.        Signature:  [name], MD [facility] Pager - [phone] 12/23/2022, 2:09 PM

## 2022-12-23 NOTE — Patient Instructions (Signed)
Will arrange for a new CPAP machine and refitting of your mask chin strap  Follow up in 4 months

## 2023-01-05 ENCOUNTER — Other Ambulatory Visit: Payer: Self-pay | Admitting: Obstetrics and Gynecology

## 2023-01-05 DIAGNOSIS — Z1231 Encounter for screening mammogram for malignant neoplasm of breast: Secondary | ICD-10-CM

## 2023-01-10 NOTE — Telephone Encounter (Signed)
From pt

## 2023-01-14 ENCOUNTER — Ambulatory Visit
Admission: RE | Admit: 2023-01-14 | Discharge: 2023-01-14 | Disposition: A | Discharge status: Home / Self Care | Payer: Medicare HMO | Source: Ambulatory Visit | Attending: Obstetrics and Gynecology | Admitting: Obstetrics and Gynecology

## 2023-01-14 DIAGNOSIS — Z1231 Encounter for screening mammogram for malignant neoplasm of breast: Secondary | ICD-10-CM

## 2023-03-02 ENCOUNTER — Other Ambulatory Visit: Payer: Self-pay | Admitting: Orthopedic Surgery

## 2023-03-02 DIAGNOSIS — M545 Low back pain, unspecified: Secondary | ICD-10-CM

## 2023-03-18 ENCOUNTER — Other Ambulatory Visit: Payer: Self-pay | Admitting: Orthopedic Surgery

## 2023-03-24 NOTE — Pre-Procedure Instructions (Signed)
Surgical Instructions   Your procedure is scheduled on March 31, 2023. Report to [facility] Main Entrance "A" at 8:30 A.M., then check in with the Admitting office. Any questions or running late day of surgery: call [phone]  Questions prior to your surgery date: call [phone], Monday-Friday, 8am-4pm. If you experience any cold or flu symptoms such as cough, fever, chills, shortness of breath, etc. between now and your scheduled surgery, please notify us at the above number.     Remember:  Do not eat after midnight the night before your surgery  You may drink clear liquids until 8:30 AM the morning of your surgery.   Clear liquids allowed are: Water, Non-Citrus Juices (without pulp), Carbonated Beverages, Clear Tea, Black Coffee Only (NO MILK, CREAM OR POWDERED CREAMER of any kind), and Gatorade.  Patient Instructions  The night before surgery:  No food after midnight. ONLY clear liquids after midnight  The day of surgery (if you do NOT have diabetes):  Drink ONE (1) Pre-Surgery Clear Ensure by 8:30 AM the morning of surgery. Drink in one sitting. Do not sip.  This drink was given to you during your hospital  pre-op appointment visit.  Nothing else to drink after completing the  Pre-Surgery Clear Ensure.         If you have questions, please contact your surgeon's office.     Take these medicines the morning of surgery with A SIP OF WATER: diltiazem (CARDIZEM CD)  fluticasone-salmeterol (ADVAIR)  hydrALAZINE (APRESOLINE)  levothyroxine (SYNTHROID, LEVOTHROID)  pantoprazole (PROTONIX)  Polyethyl Glycol-Propyl Glycol (SYSTANE) eye drops sertraline (ZOLOFT)  traMADol Janean Sark)    May take these medicines IF NEEDED: acetaminophen (TYLENOL)  albuterol (PROVENTIL) nebulizer solution  albuterol (VENTOLIN HFA) inhaler  hydrOXYzine (ATARAX)  tiZANidine (ZANAFLEX)    One week prior to surgery, STOP taking any Aspirin (unless otherwise instructed by your surgeon)  Aleve, Naproxen, Ibuprofen, Motrin, Advil, Goody's, BC's, all herbal medications, fish oil, and non-prescription vitamins. This includes your medication: diclofenac sodium (VOLTAREN) GEL                      Do NOT Smoke (Tobacco/Vaping) for 24 hours prior to your procedure.  If you use a CPAP at night, you may bring your mask/headgear for your overnight stay.   You will be asked to remove any contacts, glasses, piercing's, hearing aid's, dentures/partials prior to surgery. Please bring cases for these items if needed.    Patients discharged the day of surgery will not be allowed to drive home, and someone needs to stay with them for 24 hours.  SURGICAL WAITING ROOM VISITATION Patients may have no more than 2 support people in the waiting area - these visitors may rotate.   Pre-op nurse will coordinate an appropriate time for 1 ADULT support person, who may not rotate, to accompany patient in pre-op.  Children under the age of 29 must have an adult with them who is not the patient and must remain in the main waiting area with an adult.  If the patient needs to stay at the hospital during part of their recovery, the visitor guidelines for inpatient rooms apply.  Please refer to the [facility] website for the visitor guidelines for any additional information.   If you received a COVID test during your pre-op visit  it is requested that you wear a mask when out in public, stay away from anyone that may not be feeling well and notify your surgeon if you  develop symptoms. If you have been in contact with anyone that has tested positive in the last 10 days please notify you surgeon.      Pre-operative 5 CHG Bathing Instructions   You can play a key role in reducing the risk of infection after surgery. Your skin needs to be as free of germs as possible. You can reduce the number of germs on your skin by washing with CHG (chlorhexidine gluconate) soap before surgery. CHG is an antiseptic soap  that kills germs and continues to kill germs even after washing.   DO NOT use if you have an allergy to chlorhexidine/CHG or antibacterial soaps. If your skin becomes reddened or irritated, stop using the CHG and notify one of our RNs at [phone].   Please shower with the CHG soap starting 4 days before surgery using the following schedule:     Please keep in mind the following:  DO NOT shave, including legs and underarms, starting the day of your first shower.   You may shave your face at any point before/day of surgery.  Place clean sheets on your bed the day you start using CHG soap. Use a clean washcloth (not used since being washed) for each shower. DO NOT sleep with pets once you start using the CHG.   CHG Shower Instructions:  Wash your face and private area with normal soap. If you choose to wash your hair, wash first with your normal shampoo.  After you use shampoo/soap, rinse your hair and body thoroughly to remove shampoo/soap residue.  Turn the water OFF and apply about 3 tablespoons (45 ml) of CHG soap to a CLEAN washcloth.  Apply CHG soap ONLY FROM YOUR NECK DOWN TO YOUR TOES (washing for 3-5 minutes)  DO NOT use CHG soap on face, private areas, open wounds, or sores.  Pay special attention to the area where your surgery is being performed.  If you are having back surgery, having someone wash your back for you may be helpful. Wait 2 minutes after CHG soap is applied, then you may rinse off the CHG soap.  Pat dry with a clean towel  Put on clean clothes/pajamas   If you choose to wear lotion, please use ONLY the CHG-compatible lotions on the back of this paper.   Additional instructions for the day of surgery: DO NOT APPLY any lotions, deodorants, cologne, or perfumes.   Do not bring valuables to the hospital. [facility] is not responsible for any belongings/valuables. Do not wear nail polish, gel polish, artificial nails, or any other type of covering on natural  nails (fingers and toes) Do not wear jewelry or makeup Put on clean/comfortable clothes.  Please brush your teeth.  Ask your nurse before applying any prescription medications to the skin.     CHG Compatible Lotions   Aveeno Moisturizing lotion  Cetaphil Moisturizing Cream  Cetaphil Moisturizing Lotion  Clairol Herbal Essence Moisturizing Lotion, Dry Skin  Clairol Herbal Essence Moisturizing Lotion, Extra Dry Skin  Clairol Herbal Essence Moisturizing Lotion, Normal Skin  Curel Age Defying Therapeutic Moisturizing Lotion with Alpha Hydroxy  Curel Extreme Care Body Lotion  Curel Soothing Hands Moisturizing Hand Lotion  Curel Therapeutic Moisturizing Cream, Fragrance-Free  Curel Therapeutic Moisturizing Lotion, Fragrance-Free  Curel Therapeutic Moisturizing Lotion, Original Formula  Eucerin Daily Replenishing Lotion  Eucerin Dry Skin Therapy Plus Alpha Hydroxy Crme  Eucerin Dry Skin Therapy Plus Alpha Hydroxy Lotion  Eucerin Original Crme  Eucerin Original Lotion  Eucerin Plus Crme Eucerin Plus  Lotion  Eucerin TriLipid Replenishing Lotion  Keri Anti-Bacterial Hand Lotion  Keri Deep Conditioning Original Lotion Dry Skin Formula Softly Scented  Keri Deep Conditioning Original Lotion, Fragrance Free Sensitive Skin Formula  Keri Lotion Fast Absorbing Fragrance Free Sensitive Skin Formula  Keri Lotion Fast Absorbing Softly Scented Dry Skin Formula  Keri Original Lotion  Keri Skin Renewal Lotion Keri Silky Smooth Lotion  Keri Silky Smooth Sensitive Skin Lotion  Nivea Body Creamy Conditioning Oil  Nivea Body Extra Enriched Lotion  Nivea Body Original Lotion  Nivea Body Sheer Moisturizing Lotion Nivea Crme  Nivea Skin Firming Lotion  NutraDerm 30 Skin Lotion  NutraDerm Skin Lotion  NutraDerm Therapeutic Skin Cream  NutraDerm Therapeutic Skin Lotion  ProShield Protective Hand Cream  Provon moisturizing lotion  Please read over the following fact sheets that you were  given.

## 2023-03-25 ENCOUNTER — Other Ambulatory Visit: Payer: Medicare HMO

## 2023-03-25 ENCOUNTER — Encounter (HOSPITAL_COMMUNITY): Payer: Self-pay

## 2023-03-25 ENCOUNTER — Encounter (HOSPITAL_COMMUNITY)
Admission: RE | Admit: 2023-03-25 | Discharge: 2023-03-25 | Disposition: A | Discharge status: Home / Self Care | Payer: Medicare HMO | Source: Ambulatory Visit | Attending: Orthopedic Surgery

## 2023-03-25 ENCOUNTER — Other Ambulatory Visit: Payer: Self-pay

## 2023-03-25 VITALS — BP 160/64 | HR 63 | Temp 98.1°F | Resp 20 | Ht 59.0 in | Wt 217.9 lb

## 2023-03-25 DIAGNOSIS — G4733 Obstructive sleep apnea (adult) (pediatric): Secondary | ICD-10-CM | POA: Diagnosis not present

## 2023-03-25 DIAGNOSIS — I110 Hypertensive heart disease with heart failure: Secondary | ICD-10-CM | POA: Insufficient documentation

## 2023-03-25 DIAGNOSIS — J4540 Moderate persistent asthma, uncomplicated: Secondary | ICD-10-CM | POA: Insufficient documentation

## 2023-03-25 DIAGNOSIS — I5032 Chronic diastolic (congestive) heart failure: Secondary | ICD-10-CM | POA: Diagnosis not present

## 2023-03-25 DIAGNOSIS — Z7951 Long term (current) use of inhaled steroids: Secondary | ICD-10-CM | POA: Insufficient documentation

## 2023-03-25 DIAGNOSIS — I2510 Atherosclerotic heart disease of native coronary artery without angina pectoris: Secondary | ICD-10-CM | POA: Insufficient documentation

## 2023-03-25 DIAGNOSIS — Z01818 Encounter for other preprocedural examination: Secondary | ICD-10-CM

## 2023-03-25 DIAGNOSIS — Z01812 Encounter for preprocedural laboratory examination: Secondary | ICD-10-CM | POA: Insufficient documentation

## 2023-03-25 LAB — CBC
HCT: 38.7 % (ref 36.0–46.0)
Hemoglobin: 12.2 g/dL (ref 12.0–15.0)
MCH: 32.4 pg (ref 26.0–34.0)
MCHC: 31.5 g/dL (ref 30.0–36.0)
MCV: 102.9 fL — ABNORMAL HIGH (ref 80.0–100.0)
Platelets: 215 10*3/uL (ref 150–400)
RBC: 3.76 MIL/uL — ABNORMAL LOW (ref 3.87–5.11)
RDW: 15.3 % (ref 11.5–15.5)
WBC: 5.5 10*3/uL (ref 4.0–10.5)
nRBC: 0 % (ref 0.0–0.2)

## 2023-03-25 LAB — BASIC METABOLIC PANEL
Anion gap: 8 (ref 5–15)
BUN: 11 mg/dL (ref 8–23)
CO2: 25 mmol/L (ref 22–32)
Calcium: 9.5 mg/dL (ref 8.9–10.3)
Chloride: 103 mmol/L (ref 98–111)
Creatinine, Ser: 0.66 mg/dL (ref 0.44–1.00)
GFR, Estimated: >60 mL/min (ref >60)
Glucose, Bld: 102 mg/dL — ABNORMAL HIGH (ref 70–99)
Potassium: 4 mmol/L (ref 3.5–5.1)
Sodium: 136 mmol/L (ref 135–145)

## 2023-03-25 LAB — SURGICAL PCR SCREEN
MRSA, PCR: NEGATIVE
Staphylococcus aureus: NEGATIVE

## 2023-03-25 NOTE — Progress Notes (Signed)
PCP - Dani Gobble, PA-C Cardiologist - Dr. Yates Decamp - Last office visit 04/12/2022  PPM/ICD - Denies Device Orders - n/a Rep Notified - n/a  Chest x-ray - Denies EKG - 04/12/2022 Stress Test - 01/17/2019 ECHO - 06/22/2021 Cardiac Cath - Denies  Sleep Study - +OSA. Pt wears CPAP nightly. Pt thinks her pressure setting is 5, but not completely sure.  No DM  Last dose of GLP1 agonist- n/a GLP1 instructions: n/a  Blood Thinner Instructions: n/a Aspirin Instructions: n/a  ERAS Protcol - Clear liquids until 0830 morning of surgery PRE-SURGERY Ensure or G2- Ensure given to pt with instructions  COVID TEST- n/a   Anesthesia review: Yes. HTN (with yearly cardiology visits), Asthma, OSA and hx of Left Breast cancer.    Patient denies shortness of breath, fever, cough and chest pain at PAT appointment. Pt denies any respiratory illness/infection in the last two months.    All instructions explained to the patient, with a verbal understanding of the material. Patient agrees to go over the instructions while at home for a better understanding. Patient also instructed to self quarantine after being tested for COVID-19. The opportunity to ask questions was provided.

## 2023-03-28 NOTE — Anesthesia Preprocedure Evaluation (Signed)
Anesthesia Evaluation  Patient identified by MRN, date of birth, ID band Patient awake    Reviewed: Allergy & Precautions, NPO status , Patient's Chart, lab work & pertinent test results  History of Anesthesia Complications (+) PONV and history of anesthetic complications  Airway Mallampati: II  TM Distance: >3 FB Neck ROM: Full    Dental  (+) Dental Advisory Given   Pulmonary asthma , sleep apnea    Pulmonary exam normal        Cardiovascular hypertension, Pt. on medications Normal cardiovascular exam     Neuro/Psych  PSYCHIATRIC DISORDERS Anxiety     negative neurological ROS     GI/Hepatic negative GI ROS, Neg liver ROS,,,  Endo/Other  Hypothyroidism  Morbid obesity  Renal/GU negative Renal ROS     Musculoskeletal  (+) Arthritis ,    Abdominal   Peds  Hematology negative hematology ROS (+)   Anesthesia Other Findings   Reproductive/Obstetrics                             Anesthesia Physical Anesthesia Plan  ASA: 3  Anesthesia Plan: General   Post-op Pain Management: Minimal or no pain anticipated   Induction: Intravenous  PONV Risk Score and Plan: 4 or greater and Treatment may vary due to age or medical condition and Ondansetron  Airway Management Planned: Oral ETT  Additional Equipment: None  Intra-op Plan:   Post-operative Plan: Extubation in OR  Informed Consent: I have reviewed the patients History and Physical, chart, labs and discussed the procedure including the risks, benefits and alternatives for the proposed anesthesia with the patient or authorized representative who has indicated his/her understanding and acceptance.     Dental advisory given  Plan Discussed with: CRNA and Anesthesiologist  Anesthesia Plan Comments: (PAT note by Antionette Poles, PA-C: Follows with cardiology for hx of HTN and HFpEF.  Last seen by Harless Litten, NP on 04/13/2019.  Stable  at that time, no changes to management, 1 year follow-up recommended.  Follows with pulmonology for history of OSA on CPAP and moderate persistent asthma.  She is maintained on Advair once daily and as needed albuterol.  History of breast cancer s/p left modified radical mastectomy with axillary lymph node dissection and chemotherapy 1988.  Preop labs reviewed, unremarkable.  EKG 04/12/2022: Sinus rhythm at rate of 61 bpm.  Left axis.  Left anterior fascicular block. Poor  r wave progression. Low voltage complexes.  Compared to previous EKG on 07/01/21, no significant change  Lexiscan Myoview stress test 01/17/2019: Lexiscan stress test was performed. Stress EKG is non-diagnostic, as this is pharmacological stress test. Pharmacological myocardial perfusion imaging is normal. LVEF 69%. Low risk study.   Echocardiogram 06/22/2021:   Normal LV systolic function with EF 57%. Left ventricle cavity is normal in size. Moderate concentric remodeling of the left ventricle. Normal global wall motion. Doppler evidence of grade I (impaired) diastolic dysfunction, elevated LAP. E/e' 15.1 with E/A 0.8 and DT 232. Calculated EF 57%.  Left atrial cavity is mildly dilated.  Trace mitral regurgitation. Mild calcification of the mitral valve annulus. No evidence of mitral valve stenosis.  No significant change from 12/07/2019.    )        Anesthesia Quick Evaluation

## 2023-03-28 NOTE — Progress Notes (Signed)
Anesthesia Chart Review:   Follows with cardiology for hx of HTN and HFpEF.  Last seen by Harless Litten, NP on 04/13/2019.  Stable at that time, no changes to management, 1 year follow-up recommended.  Follows with pulmonology for history of OSA on CPAP and moderate persistent asthma.  She is maintained on Advair once daily and as needed albuterol.  History of breast cancer s/p left modified radical mastectomy with axillary lymph node dissection and chemotherapy 1988.  Preop labs reviewed, unremarkable.  EKG 04/12/2022: Sinus rhythm at rate of 61 bpm.  Left axis.  Left anterior fascicular block. Poor  r wave progression. Low voltage complexes.  Compared to previous EKG on 07/01/21, no significant change  Lexiscan Myoview stress test 01/17/2019: Lexiscan stress test was performed. Stress EKG is non-diagnostic, as this is pharmacological stress test. Pharmacological myocardial perfusion imaging is normal. LVEF 69%. Low risk study.    Echocardiogram 06/22/2021:    Normal LV systolic function with EF 57%. Left ventricle cavity is normal in size. Moderate concentric remodeling of the left ventricle. Normal global wall motion. Doppler evidence of grade I (impaired) diastolic dysfunction, elevated LAP. E/e' 15.1 with E/A 0.8 and DT 232. Calculated EF 57%.  Left atrial cavity is mildly dilated.  Trace mitral regurgitation. Mild calcification of the mitral valve annulus. No evidence of mitral valve stenosis.  No significant change from 12/07/2019.     [name] [facility] Short Stay Center/Anesthesiology Phone [phone] 03/28/2023 12:30 PM

## 2023-03-31 ENCOUNTER — Other Ambulatory Visit: Payer: Self-pay

## 2023-03-31 ENCOUNTER — Ambulatory Visit (HOSPITAL_BASED_OUTPATIENT_CLINIC_OR_DEPARTMENT_OTHER): Payer: Medicare HMO | Admitting: Certified Registered"

## 2023-03-31 ENCOUNTER — Ambulatory Visit (HOSPITAL_COMMUNITY)
Admission: AD | Admit: 2023-03-31 | Discharge: 2023-03-31 | Disposition: A | Discharge status: Home / Self Care | Payer: Medicare HMO | Source: Ambulatory Visit | Attending: Orthopedic Surgery | Admitting: Orthopedic Surgery

## 2023-03-31 ENCOUNTER — Ambulatory Visit (HOSPITAL_COMMUNITY): Payer: Self-pay | Admitting: Physician Assistant

## 2023-03-31 ENCOUNTER — Ambulatory Visit (HOSPITAL_COMMUNITY): Payer: Medicare HMO

## 2023-03-31 ENCOUNTER — Encounter (HOSPITAL_COMMUNITY): Payer: Self-pay | Admitting: Orthopedic Surgery

## 2023-03-31 ENCOUNTER — Encounter (HOSPITAL_COMMUNITY)
Admission: AD | Disposition: A | Discharge status: Home / Self Care | Payer: Self-pay | Source: Ambulatory Visit | Attending: Orthopedic Surgery

## 2023-03-31 DIAGNOSIS — F419 Anxiety disorder, unspecified: Secondary | ICD-10-CM | POA: Diagnosis not present

## 2023-03-31 DIAGNOSIS — M8008XA Age-related osteoporosis with current pathological fracture, vertebra(e), initial encounter for fracture: Secondary | ICD-10-CM | POA: Insufficient documentation

## 2023-03-31 DIAGNOSIS — E039 Hypothyroidism, unspecified: Secondary | ICD-10-CM | POA: Insufficient documentation

## 2023-03-31 DIAGNOSIS — J45909 Unspecified asthma, uncomplicated: Secondary | ICD-10-CM | POA: Diagnosis not present

## 2023-03-31 DIAGNOSIS — M4856XA Collapsed vertebra, not elsewhere classified, lumbar region, initial encounter for fracture: Secondary | ICD-10-CM

## 2023-03-31 DIAGNOSIS — M1990 Unspecified osteoarthritis, unspecified site: Secondary | ICD-10-CM | POA: Diagnosis not present

## 2023-03-31 DIAGNOSIS — I10 Essential (primary) hypertension: Secondary | ICD-10-CM | POA: Diagnosis not present

## 2023-03-31 DIAGNOSIS — Z6841 Body Mass Index (BMI) 40.0 and over, adult: Secondary | ICD-10-CM | POA: Insufficient documentation

## 2023-03-31 DIAGNOSIS — G4730 Sleep apnea, unspecified: Secondary | ICD-10-CM | POA: Diagnosis not present

## 2023-03-31 HISTORY — PX: KYPHOPLASTY: SHX5884

## 2023-03-31 SURGERY — KYPHOPLASTY
Anesthesia: General | Site: Spine Lumbar

## 2023-03-31 MED ORDER — BUPIVACAINE-EPINEPHRINE (PF) 0.25% -1:200000 IJ SOLN
INTRAMUSCULAR | Status: DC | PRN
  Administered 2023-03-31: .01 mL

## 2023-03-31 MED ORDER — BACITRACIN ZINC 500 UNIT/GM EX OINT
TOPICAL_OINTMENT | CUTANEOUS | Status: AC
  Filled 2023-03-31: qty 28.35

## 2023-03-31 MED ORDER — FENTANYL CITRATE (PF) 100 MCG/2ML IJ SOLN
INTRAMUSCULAR | Status: AC
  Filled 2023-03-31: qty 2

## 2023-03-31 MED ORDER — ROCURONIUM BROMIDE 10 MG/ML (PF) SYRINGE
PREFILLED_SYRINGE | INTRAVENOUS | Status: DC | PRN
  Administered 2023-03-31: 60 mg via INTRAVENOUS

## 2023-03-31 MED ORDER — FENTANYL CITRATE (PF) 100 MCG/2ML IJ SOLN: 25.0000 ug | INTRAMUSCULAR | Status: DC | PRN

## 2023-03-31 MED ORDER — CHLORHEXIDINE GLUCONATE 0.12 % MT SOLN: 15.0000 mL | Freq: Once | OROMUCOSAL | Status: AC

## 2023-03-31 MED ORDER — DEXAMETHASONE SODIUM PHOSPHATE 10 MG/ML IJ SOLN
INTRAMUSCULAR | Status: DC | PRN
Start: 2023-03-31 — End: 2023-03-31
  Administered 2023-03-31: 5 mg via INTRAVENOUS

## 2023-03-31 MED ORDER — PHENYLEPHRINE HCL (PRESSORS) 10 MG/ML IV SOLN
INTRAVENOUS | Status: DC | PRN
Start: 2023-03-31 — End: 2023-03-31
  Administered 2023-03-31 (×2): 80 ug via INTRAVENOUS

## 2023-03-31 MED ORDER — ACETAMINOPHEN 500 MG PO TABS: 1000.0000 mg | ORAL_TABLET | Freq: Once | ORAL | Status: DC

## 2023-03-31 MED ORDER — OXYCODONE HCL 5 MG PO TABS: 5.0000 mg | ORAL_TABLET | Freq: Once | ORAL | Status: DC | PRN

## 2023-03-31 MED ORDER — PROPOFOL 10 MG/ML IV BOLUS
INTRAVENOUS | Status: AC
  Filled 2023-03-31: qty 20

## 2023-03-31 MED ORDER — CEFAZOLIN SODIUM-DEXTROSE 2-4 GM/100ML-% IV SOLN
2.0000 g | INTRAVENOUS | Status: AC
  Administered 2023-03-31: 2 g via INTRAVENOUS

## 2023-03-31 MED ORDER — LIDOCAINE 2% (20 MG/ML) 5 ML SYRINGE
INTRAMUSCULAR | Status: DC | PRN
  Administered 2023-03-31: 60 mg via INTRAVENOUS

## 2023-03-31 MED ORDER — ONDANSETRON HCL 4 MG/2ML IJ SOLN
INTRAMUSCULAR | Status: AC
  Filled 2023-03-31: qty 2

## 2023-03-31 MED ORDER — ORAL CARE MOUTH RINSE: 15.0000 mL | Freq: Once | OROMUCOSAL | Status: AC

## 2023-03-31 MED ORDER — HYDROMORPHONE HCL 1 MG/ML IJ SOLN
INTRAMUSCULAR | Status: AC
  Filled 2023-03-31: qty 0.5

## 2023-03-31 MED ORDER — FENTANYL CITRATE (PF) 100 MCG/2ML IJ SOLN
25.0000 ug | INTRAMUSCULAR | Status: DC | PRN
  Administered 2023-03-31: 50 ug via INTRAVENOUS

## 2023-03-31 MED ORDER — CHLORHEXIDINE GLUCONATE 0.12 % MT SOLN
OROMUCOSAL | Status: AC
  Administered 2023-03-31: 15 mL via OROMUCOSAL
  Filled 2023-03-31: qty 15

## 2023-03-31 MED ORDER — PROPOFOL 10 MG/ML IV BOLUS
INTRAVENOUS | Status: DC | PRN
Start: 2023-03-31 — End: 2023-03-31
  Administered 2023-03-31: 50 mg via INTRAVENOUS
  Administered 2023-03-31: 130 mg via INTRAVENOUS

## 2023-03-31 MED ORDER — CEFAZOLIN SODIUM-DEXTROSE 2-4 GM/100ML-% IV SOLN
INTRAVENOUS | Status: AC
  Filled 2023-03-31: qty 100

## 2023-03-31 MED ORDER — IOPAMIDOL (ISOVUE-300) INJECTION 61%
INTRAVENOUS | Status: DC | PRN
  Administered 2023-03-31: 300 mg

## 2023-03-31 MED ORDER — AMISULPRIDE (ANTIEMETIC) 5 MG/2ML IV SOLN
INTRAVENOUS | Status: AC
  Filled 2023-03-31: qty 4

## 2023-03-31 MED ORDER — LIDOCAINE 2% (20 MG/ML) 5 ML SYRINGE
INTRAMUSCULAR | Status: AC
  Filled 2023-03-31: qty 5

## 2023-03-31 MED ORDER — HYDROMORPHONE HCL 1 MG/ML IJ SOLN
INTRAMUSCULAR | Status: DC | PRN
Start: 2023-03-31 — End: 2023-03-31
  Administered 2023-03-31: .5 mg via INTRAVENOUS

## 2023-03-31 MED ORDER — 0.9 % SODIUM CHLORIDE (POUR BTL) OPTIME
TOPICAL | Status: DC | PRN
  Administered 2023-03-31 (×2): 1000 mL

## 2023-03-31 MED ORDER — LACTATED RINGERS IV SOLN: INTRAVENOUS | Status: DC

## 2023-03-31 MED ORDER — OXYCODONE HCL 5 MG/5ML PO SOLN: 5.0000 mg | Freq: Once | ORAL | Status: DC | PRN

## 2023-03-31 MED ORDER — AMISULPRIDE (ANTIEMETIC) 5 MG/2ML IV SOLN
10.0000 mg | Freq: Once | INTRAVENOUS | Status: AC | PRN
  Administered 2023-03-31: 10 mg via INTRAVENOUS

## 2023-03-31 MED ORDER — BACITRACIN 500 UNIT/GM EX OINT
TOPICAL_OINTMENT | CUTANEOUS | Status: DC | PRN
  Administered 2023-03-31: 1 via TOPICAL

## 2023-03-31 MED ORDER — ONDANSETRON HCL 4 MG/2ML IJ SOLN
INTRAMUSCULAR | Status: DC | PRN
Start: 2023-03-31 — End: 2023-03-31
  Administered 2023-03-31: 4 mg via INTRAVENOUS

## 2023-03-31 MED ORDER — ROCURONIUM BROMIDE 10 MG/ML (PF) SYRINGE
PREFILLED_SYRINGE | INTRAVENOUS | Status: AC
  Filled 2023-03-31: qty 10

## 2023-03-31 MED ORDER — BUPIVACAINE-EPINEPHRINE (PF) 0.25% -1:200000 IJ SOLN
INTRAMUSCULAR | Status: AC
  Filled 2023-03-31: qty 30

## 2023-03-31 MED ORDER — EPHEDRINE SULFATE (PRESSORS) 50 MG/ML IJ SOLN
INTRAMUSCULAR | Status: DC | PRN
  Administered 2023-03-31 (×2): 10 mg via INTRAVENOUS
  Administered 2023-03-31: 5 mg via INTRAVENOUS

## 2023-03-31 SURGICAL SUPPLY — 48 items
BAG COUNTER SPONGE SURGICOUNT (BAG) ×1 IMPLANT
BAG SPNG CNTER NS LX DISP (BAG) ×1
BLADE SURG 15 STRL LF DISP TIS (BLADE) ×1 IMPLANT
BLADE SURG 15 STRL SS (BLADE) ×1
CEMENT KYPHON C01A KIT/MIXER (Cement) IMPLANT
COVER MAYO STAND STRL (DRAPES) ×1 IMPLANT
COVER SURGICAL LIGHT HANDLE (MISCELLANEOUS) ×1 IMPLANT
CURETTE EXPRESS SZ2 7MM (INSTRUMENTS) IMPLANT
CURRETTE EXPRESS SZ2 7MM (INSTRUMENTS)
DRAPE C-ARM 42X72 X-RAY (DRAPES) ×1 IMPLANT
DRAPE HALF SHEET 40X57 (DRAPES) IMPLANT
DRAPE INCISE IOBAN 66X45 STRL (DRAPES) ×1 IMPLANT
DRAPE LAPAROTOMY T 102X78X121 (DRAPES) ×1 IMPLANT
DRAPE SURG 17X23 STRL (DRAPES) ×4 IMPLANT
DRAPE WARM FLUID 44X44 (DRAPES) ×1 IMPLANT
DURAPREP 26ML APPLICATOR (WOUND CARE) ×1 IMPLANT
GAUZE 4X4 16PLY ~~LOC~~+RFID DBL (SPONGE) ×1 IMPLANT
GAUZE SPONGE 2X2 8PLY STRL LF (GAUZE/BANDAGES/DRESSINGS) ×1 IMPLANT
GLOVE BIO SURGEON STRL SZ 6.5 (GLOVE) ×1 IMPLANT
GLOVE BIO SURGEON STRL SZ8 (GLOVE) ×1 IMPLANT
GLOVE BIOGEL PI IND STRL 7.0 (GLOVE) ×1 IMPLANT
GLOVE BIOGEL PI IND STRL 8 (GLOVE) ×1 IMPLANT
GLOVE SURG ENC MOIS LTX SZ6.5 (GLOVE) ×1 IMPLANT
GOWN STRL REUS W/ TWL LRG LVL3 (GOWN DISPOSABLE) ×2 IMPLANT
GOWN STRL REUS W/ TWL XL LVL3 (GOWN DISPOSABLE) ×1 IMPLANT
GOWN STRL REUS W/TWL LRG LVL3 (GOWN DISPOSABLE) ×2
GOWN STRL REUS W/TWL XL LVL3 (GOWN DISPOSABLE)
INTRODUCER DEVICE OSTEO LEVEL (INTRODUCER) IMPLANT
KIT BASIN OR (CUSTOM PROCEDURE TRAY) ×1 IMPLANT
KIT TURNOVER KIT B (KITS) ×1 IMPLANT
NDL 22X1.5 STRL (OR ONLY) (MISCELLANEOUS) IMPLANT
NDL HYPO 25X1 1.5 SAFETY (NEEDLE) ×1 IMPLANT
NDL SPNL 18GX3.5 QUINCKE PK (NEEDLE) ×2 IMPLANT
NEEDLE 22X1.5 STRL (OR ONLY) (MISCELLANEOUS) IMPLANT
NEEDLE HYPO 25X1 1.5 SAFETY (NEEDLE) ×1 IMPLANT
NEEDLE SPNL 18GX3.5 QUINCKE PK (NEEDLE) ×1 IMPLANT
NS IRRIG 1000ML POUR BTL (IV SOLUTION) ×1 IMPLANT
PACK UNIVERSAL I (CUSTOM PROCEDURE TRAY) ×1 IMPLANT
PAD ARMBOARD 7.5X6 YLW CONV (MISCELLANEOUS) ×2 IMPLANT
POSITIONER HEAD PRONE TRACH (MISCELLANEOUS) ×1 IMPLANT
SUT MNCRL AB 4-0 PS2 18 (SUTURE) ×1 IMPLANT
SYR BULB IRRIG 60ML STRL (SYRINGE) ×1 IMPLANT
SYR CONTROL 10ML LL (SYRINGE) ×1 IMPLANT
TAPE CLOTH SOFT 2X10 (GAUZE/BANDAGES/DRESSINGS) IMPLANT
TOWEL GREEN STERILE (TOWEL DISPOSABLE) ×1 IMPLANT
TOWEL GREEN STERILE FF (TOWEL DISPOSABLE) ×1 IMPLANT
TRAY KYPHOPAK 15/3 ONESTEP 1ST (MISCELLANEOUS) IMPLANT
TRAY KYPHOPAK 20/3 ONESTEP 1ST (MISCELLANEOUS) IMPLANT

## 2023-03-31 NOTE — Anesthesia Postprocedure Evaluation (Signed)
Anesthesia Post Note  Patient: [name]  Procedure(s) Performed: LUMBAR 4 KYPHOPLASTY (Spine Lumbar)     Patient location during evaluation: PACU Anesthesia Type: General Level of consciousness: awake and alert Pain management: pain level controlled Vital Signs Assessment: post-procedure vital signs reviewed and stable Respiratory status: spontaneous breathing, nonlabored ventilation and respiratory function stable Cardiovascular status: stable and blood pressure returned to baseline Anesthetic complications: no   No notable events documented.  Last Vitals:  Vitals:   03/31/23 1345 03/31/23 1400  BP: 132/65 (!) 150/55  Pulse: 73 73  Resp: 18 19  Temp:  36.6 C  SpO2: 96% 96%    Last Pain:  Vitals:   03/31/23 1345  TempSrc:   PainSc: 0-No pain                 Beryle Lathe

## 2023-03-31 NOTE — H&P (Signed)
PREOPERATIVE H&P  Chief Complaint: Low back pain  HPI: [name] is a 82 y.o. female who presents with ongoing pain in the low back  MRI is notable for an L4 compression fracture, subacute  Patient has failed multiple forms of conservative care and continues to have pain (see office notes for additional details regarding the patient's full course of treatment)  Past Medical History:  Diagnosis Date   Anemia    PMH;  Patient denies this dx as of 06/03/21   Anxiety    Asthma    Breast cancer (HCC) 1988   left breast cancer   Collagenous colitis    Compression fracture    lumbar 1   Dyspnea    with exertion   Hypertension    Hypothyroidism    Osteoarthritis    Osteopenia    Pneumonia 07/2020   PONV (postoperative nausea and vomiting)    Sleep apnea    wears CPAP nightly   Past Surgical History:  Procedure Laterality Date   BREAST REDUCTION SURGERY     Right   CATARACT EXTRACTION W/ INTRAOCULAR LENS  IMPLANT, BILATERAL     CESAREAN SECTION     COLONOSCOPY W/ BIOPSIES AND POLYPECTOMY     CYST EXCISION N/A 08/30/2018   Procedure: excision of posterior shoulder / back sebaceous cyst;  Surgeon: Peggye Form, DO;  Location: [facility];  Service: Plastics;  Laterality: N/A;   KNEE ARTHROSCOPY Left 02/23/2022   Procedure: LEFT KNEE ARTHROSCOPY;  Surgeon: Marcene Corning, MD;  Location: [facility];  Service: Orthopedics;  Laterality: Left;   KYPHOPLASTY N/A 02/18/2016   Procedure: LUMBAR 1 KYPHOPLASTY;  Surgeon: Estill Bamberg, MD;  Location: [facility];  Service: Orthopedics;  Laterality: N/A;  LUMBAR 1 KYPHOPLASTY   KYPHOPLASTY N/A 06/04/2021   Procedure: LUMBAR 2 KYPHOPLASTY;  Surgeon: Estill Bamberg, MD;  Location: [facility];  Service: Orthopedics;  Laterality: N/A;   LAPAROSCOPIC APPENDECTOMY N/A 12/09/2021   Procedure: APPENDECTOMY LAPAROSCOPIC;  Surgeon: Andria Meuse, MD;  Location: [facility];  Service: General;  Laterality: N/A;   LUMBAR DISC  SURGERY     MODIFIED RADICAL MASTECTOMY W/ AXILLARY LYMPH NODE DISSECTION     Left   RECONSTRUCTION BREAST W/ LATISSIMUS DORSI FLAP     left   REDUCTION MAMMAPLASTY Right    right foot surgery     corrected hammer toe, straightened big toe   SEPTOPLASTY     TOE SURGERY     TONSILLECTOMY AND ADENOIDECTOMY     Social History   Socioeconomic History   Marital status: Married    Spouse name: Not on file   Number of children: 1   Years of education: Not on file   Highest education level: Not on file  Occupational History   Occupation: retired  Tobacco Use   Smoking status: Never   Smokeless tobacco: Never  Vaping Use   Vaping status: Never Used  Substance and Sexual Activity   Alcohol use: Yes    Alcohol/week: 3.0 - 4.0 standard drinks of alcohol    Types: 3 - 4 Glasses of wine per week   Drug use: No   Sexual activity: Not Currently    Birth control/protection: Post-menopausal  Other Topics Concern   Not on file  Social History Narrative   Not on file   Social Determinants of Health   Financial Resource Strain: Low Risk  (10/23/2022)   Received from [facility], [facility]   Overall  Financial Resource Strain (CARDIA)    Difficulty of Paying Living Expenses: Not hard at all  Food Insecurity: No Food Insecurity (10/23/2022)   Received from [facility], [facility]   Hunger Vital Sign    Worried About Running Out of Food in the Last Year: Never true    Ran Out of Food in the Last Year: Never true  Transportation Needs: No Transportation Needs (10/23/2022)   Received from [facility], [facility]   PRAPARE - Transportation    Lack of Transportation (Medical): No    Lack of Transportation (Non-Medical): No  Physical Activity: Insufficiently Active (10/23/2022)   Received from [facility], [facility]   Exercise Vital Sign    Days of Exercise per Week: 2 days    Minutes of Exercise per Session: 20 min  Stress: No Stress Concern Present (10/23/2022)    Received from [facility], [facility] Occupational Health - Occupational Stress Questionnaire    Feeling of Stress : Not at all  Social Connections: Socially Integrated (10/23/2022)   Received from [facility], [facility]   Social Network    How would you rate your social network (family, work, friends)?: Good participation with social networks   Family History  Problem Relation Age of Onset   Lung cancer Mother    Hypertension Mother    Hypertension Father    Ovarian cancer Sister    Hypertension Brother    Angina Brother 37   Breast cancer Maternal Aunt    Brain cancer Maternal Aunt    Kidney cancer Paternal Uncle    Prostate cancer Paternal Uncle    Breast cancer Other        cousin   Ovarian cancer Other        cousin   Kidney cancer Other        cousin   Allergies  Allergen Reactions   Nebivolol Hcl Other (See Comments)    Hypotensive episode  Bystolic*   Bactrim [Sulfamethoxazole-Trimethoprim] Other (See Comments)    UNSPECIFIED    Dairy Aid [Tilactase] Diarrhea and Other (See Comments)    Ice cream, sour cream   Lactose Intolerance (Gi) Diarrhea   Microplegia Msa-Msg [Cardioplegia Del Nido Formula] Other (See Comments)    Unknown reaction    Adhesive [Tape] Rash   Mobic [Meloxicam] Nausea Only   Other Nausea And Vomiting    general anesthesia   Sulfa Antibiotics Rash        Prior to Admission medications   Medication Sig Start Date End Date Taking? Authorizing Provider  acetaminophen (TYLENOL) 650 MG CR tablet Take 1,300 mg by mouth every 8 (eight) hours as needed for pain.   Yes [provider]  albuterol (VENTOLIN HFA) 108 (90 Base) MCG/ACT inhaler Inhale 2 puffs into the lungs every 6 (six) hours as needed for wheezing or shortness of breath. 12/23/22  Yes [name], MD  B Complex-C (B-COMPLEX WITH VITAMIN C) tablet Take 1 tablet by mouth in the morning.   Yes [provider]  Calcium  Carb-Cholecalciferol (CALTRATE 600+D3 PO) Take 1 tablet by mouth in the morning and at bedtime.   Yes [provider]  clobetasol ointment (TEMOVATE) 0.05 % Apply 1 application topically 2 (two) times daily as needed (irritation).   Yes [provider]  dextromethorphan-guaiFENesin (MUCINEX DM) 30-600 MG 12hr tablet Take 1 tablet by mouth 2 (two) times daily.   Yes [provider]  diclofenac sodium (VOLTAREN) 1 % GEL  Apply 1 application  topically 4 (four) times daily as needed (pain.). 04/10/14  Yes [provider]  diltiazem (CARDIZEM CD) 240 MG 24 hr capsule Take 240 mg by mouth in the morning.   Yes [provider]  diphenhydrAMINE HCl, Sleep, (ZZZQUIL PO) Take 1 Dose by mouth at bedtime. 2 cupfuls   Yes [provider]  famotidine (PEPCID) 40 MG tablet Take 40 mg by mouth at bedtime.   Yes [provider]  fluticasone-salmeterol (ADVAIR) 100-50 MCG/ACT AEPB Inhale 1 puff into the lungs in the morning and at bedtime.   Yes [provider]  furosemide (LASIX) 40 MG tablet Take 40 mg by mouth daily as needed for fluid. 10/03/21  Yes [provider]  hydrALAZINE (APRESOLINE) 50 MG tablet TAKE ONE TABLET BY MOUTH THREE TIMES A DAY 10/08/22  Yes Custovic, Rozell Searing, DO  hydrOXYzine (ATARAX) 25 MG tablet Take 25 mg by mouth every 8 (eight) hours as needed for itching.   Yes [provider]  levocetirizine (XYZAL) 5 MG tablet Take 5 mg by mouth every evening.   Yes [provider]  levothyroxine (SYNTHROID, LEVOTHROID) 88 MCG tablet Take 88 mcg by mouth daily before breakfast.   Yes [provider]  losartan (COZAAR) 100 MG tablet Take 100 mg by mouth in the morning.   Yes [provider]  Multiple Vitamins-Minerals (MULTIVITAMIN WITH MINERALS) tablet Take 1 tablet by mouth daily at 12 noon. Centrum Silver for Women   Yes [provider]  NON FORMULARY Pt uses a cpap nightly   Yes  [provider]  pantoprazole (PROTONIX) 40 MG tablet Take 40 mg by mouth 2 (two) times daily before a meal. 02/21/23  Yes [provider]  Polyethyl Glycol-Propyl Glycol (SYSTANE) 0.4-0.3 % SOLN Place 1-2 drops into both eyes in the morning and at bedtime.   Yes [provider]  potassium chloride SA (KLOR-CON M) 20 MEQ tablet Take 20 mEq by mouth 3 (three) times daily.   Yes [provider]  sertraline (ZOLOFT) 100 MG tablet Take 100 mg by mouth 2 (two) times daily.    Yes [provider]  tiZANidine (ZANAFLEX) 4 MG tablet Take 4 mg by mouth every 8 (eight) hours as needed for muscle spasms.   Yes [provider]  traMADol (ULTRAM) 50 MG tablet Take 50 mg by mouth 4 (four) times daily. 12/06/22  Yes [provider]  albuterol (PROVENTIL) (2.5 MG/3ML) 0.083% nebulizer solution Take 3 mLs (2.5 mg dose) by nebulization every 6 (six) hours as needed for Wheezing. 10/09/19   [name], MD  betamethasone dipropionate (DIPROLENE) 0.05 % ointment Apply 1 application  topically 2 (two) times daily as needed (irritation).    [provider]  denosumab (PROLIA) 60 MG/ML SOSY injection Inject 60 mg into the skin every 6 (six) months.    [provider]  ORTHOVISC 30 MG/2ML SOSY Inject 6 mLs into the skin every 6 (six) months. 11/10/20   [provider]     All other systems have been reviewed and were otherwise negative with the exception of those mentioned in the HPI and as above.  Physical Exam: Vitals:   03/31/23 0847  BP: (!) 148/62  Pulse: 76  Resp: 17  Temp: 98 F (36.7 C)  SpO2: 96%    Body mass index is 44.09 kg/m.  General: Alert, no acute distress Cardiovascular: No pedal edema Respiratory: No cyanosis, no use of accessory musculature Skin: No lesions in  the area of chief complaint Neurologic: Sensation intact distally Psychiatric: Patient is competent for consent with normal mood and  affect Lymphatic: No axillary or cervical lymphadenopathy   Assessment/Plan: L4 COMPRESSION FRACTURE Plan for Procedure(s): LUMBAR 4 KYPHOPLASTY   Jackelyn Hoehn, MD 03/31/2023 11:23 AM

## 2023-03-31 NOTE — Transfer of Care (Signed)
Immediate Anesthesia Transfer of Care Note  Patient: Terisha A Fraticelli  Procedure(s) Performed: LUMBAR 4 KYPHOPLASTY (Spine Lumbar)  Patient Location: PACU  Anesthesia Type:General  Level of Consciousness: awake, alert , and oriented  Airway & Oxygen Therapy: Patient Spontanous Breathing  Post-op Assessment: Report given to RN and Post -op Vital signs reviewed and stable  Post vital signs: Reviewed and stable  Last Vitals:  Vitals Value Taken Time  BP 166/68 03/31/23 1330  Temp    Pulse 71 03/31/23 1331  Resp 14 03/31/23 1331  SpO2 93 % 03/31/23 1331  Vitals shown include unfiled device data.  Last Pain:  Vitals:   03/31/23 0905  TempSrc:   PainSc: 0-No pain         Complications: No notable events documented.

## 2023-03-31 NOTE — Anesthesia Procedure Notes (Signed)
Procedure Name: Intubation Date/Time: 03/31/2023 12:18 PM  Performed by: Alwyn Ren, CRNAPre-anesthesia Checklist: Patient identified, Emergency Drugs available, Suction available and Patient being monitored Patient Re-evaluated:Patient Re-evaluated prior to induction Oxygen Delivery Method: Circle system utilized Preoxygenation: Pre-oxygenation with 100% oxygen Induction Type: IV induction Ventilation: Mask ventilation without difficulty Laryngoscope Size: Miller and 2 Grade View: Grade I Tube type: Oral Tube size: 7.0 mm Number of attempts: 2 Airway Equipment and Method: Stylet and Oral airway Placement Confirmation: ETT inserted through vocal cords under direct vision, positive ETCO2 and breath sounds checked- equal and bilateral Secured at: 21 cm Tube secured with: Tape Dental Injury: Teeth and Oropharynx as per pre-operative assessment

## 2023-03-31 NOTE — Op Note (Signed)
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  SURGEON:  Estill Bamberg, MD.  ASSISTANT:  None  ANESTHESIA:  General endotracheal anesthesia.  COMPLICATIONS:  None.  DISPOSITION:  Stable.  ESTIMATED BLOOD LOSS:  Minimal.  INDICATIONS FOR SURGERY:  Briefly, [name] is a very pleasant 82 y.o.- year-old female, who did have an onset of pain in her low back.   Her pain was rather severe.  The patient's imaging studies did clearly reveal a L4 compression fracture. We did attempt a trial of nonoperative treatment, but the patient continued to feel rather debilitated as a result of her ongoing pain.  Given her ongoing pain and dysfunction, we did discuss proceeding with the procedure noted above.  I did fully discuss the procedure with the patient, and she did wish to proceed.  OPERATIVE DETAILS:  On 03/31/2023, the patient was brought to surgery and general endotracheal anesthesia was administered.  The patient was placed prone on a well-padded flat Jackson bed with gel rolls placed under the patient's chest and hips.  Antibiotics were given.  AP and lateral fluoroscopy was brought into the field.  The L4 pedicles were marked out in the usual fashion.  After a time-out procedure was performed, I did advance Jamshidis across the L4 pedicles on the right and left sides.  I then drilled through the Jamshidis.  I then inserted kyphoplasty balloons and I was able to inflate each of the balloons with approximately 4cc of contrast.  Partial restoration of the superior endplate was noted.  The balloons were deflated then removed.  At this point, after the cement was mixed, a total of  approximately 8cc of cement was injected through the right and left cannulas, half on the right, and half on the left.  Excellent interdigitation of cement was identified.  No abnormal extravasation was noted.  The cement was then allowed to harden, after which point the Jamshidis were removed.  The wound  was then irrigated and closed using 4-0 Monocryl.  Bacitracin and a sterile dressing were applied.  The patient was then awoken from general endotracheal anesthesia and transferred to recovery in stable condition.  Estill Bamberg, MD

## 2023-04-01 ENCOUNTER — Encounter (HOSPITAL_COMMUNITY): Payer: Self-pay | Admitting: Orthopedic Surgery

## 2023-04-11 ENCOUNTER — Other Ambulatory Visit: Payer: Self-pay

## 2023-04-11 DIAGNOSIS — I10 Essential (primary) hypertension: Secondary | ICD-10-CM

## 2023-04-11 MED ORDER — HYDRALAZINE HCL 50 MG PO TABS: 50.0000 mg | ORAL_TABLET | Freq: Three times a day (TID) | ORAL | 0 refills | Status: DC

## 2023-04-12 ENCOUNTER — Ambulatory Visit (INDEPENDENT_AMBULATORY_CARE_PROVIDER_SITE_OTHER): Payer: Medicare HMO

## 2023-04-12 ENCOUNTER — Encounter: Payer: Self-pay | Admitting: Primary Care

## 2023-04-12 ENCOUNTER — Ambulatory Visit (INDEPENDENT_AMBULATORY_CARE_PROVIDER_SITE_OTHER): Payer: Medicare HMO | Admitting: Primary Care

## 2023-04-12 VITALS — BP 157/65 | HR 76 | Ht 59.0 in | Wt 222.0 lb

## 2023-04-12 DIAGNOSIS — J209 Acute bronchitis, unspecified: Secondary | ICD-10-CM

## 2023-04-12 DIAGNOSIS — J45909 Unspecified asthma, uncomplicated: Secondary | ICD-10-CM | POA: Diagnosis not present

## 2023-04-12 DIAGNOSIS — J4541 Moderate persistent asthma with (acute) exacerbation: Secondary | ICD-10-CM | POA: Diagnosis not present

## 2023-04-12 MED ORDER — IPRATROPIUM BROMIDE 0.03 % NA SOLN: 2.0000 | Freq: Two times a day (BID) | NASAL | 12 refills | Status: DC

## 2023-04-12 NOTE — Progress Notes (Signed)
@Patient  ID: [name], female    DOB: [birth year], 82 y.o.   MRN: [id number]  Chief Complaint  Patient presents with   Follow-up    Referring provider: Dani Gobble, Demetrius Charity*  HPI: 82 year old female, smoked.  Past medical history significant itching, asthma, OSA, allergic rhinitis, hypothyroidism, osteopenia, breast cancer, obesity.  Former patient of Dr. Craige Cotta.  04/12/2023 Patient presents today for acute OV due to bronchitis symptoms. Seen by PCP and placed on Doxycyline and prednisone, she feels no better since starting antibiotic. Her asthma is typically well-controlled on Advair 100-50 mcg twice daily. She tells me her symptoms started at the same time as her husband. She developed productive cough around September 26th. Cough is worse in the morning, she is getting up thick yellow mucus. Associated PND and nocturnal wheezing.  Has not taken her Lasix for a couple of days, reports no increased leg swelling.  Denies fever/chills/sweats.    Allergies  Allergen Reactions   Nebivolol Hcl Other (See Comments)    Hypotensive episode  Bystolic*   Bactrim [Sulfamethoxazole-Trimethoprim] Other (See Comments)    UNSPECIFIED    Dairy Aid [Tilactase] Diarrhea and Other (See Comments)    Ice cream, sour cream   Lactose Intolerance (Gi) Diarrhea   Microplegia Msa-Msg [Cardioplegia Del Nido Formula] Other (See Comments)    Unknown reaction    Adhesive [Tape] Rash   Mobic [Meloxicam] Nausea Only   Other Nausea And Vomiting    general anesthesia   Sulfa Antibiotics Rash         Immunization History  Administered Date(s) Administered   Fluad Quad(high Dose 65+) 04/29/2016, 04/18/2017, 03/16/2019   H1N1 04/05/2011   Influenza Split 03/09/2013   Influenza Whole 04/15/2012   Influenza, High Dose Seasonal PF 04/29/2016, 03/07/2018, 03/20/2019, 03/24/2020, 05/04/2020   Influenza, Quadrivalent, Recombinant, Inj, Pf 04/20/2021   Influenza,inj,Quad PF,6+ Mos 04/04/2014    Influenza-Unspecified 05/04/2017   Moderna Covid-19 Vaccine Bivalent Booster 59yrs & up 03/13/2021   Moderna Sars-Cov-2 Peds vaccine 38yrs thru 20yrs 03/13/2021   PFIZER(Purple Top)SARS-COV-2 Vaccination 07/17/2019, 08/06/2019, 11/14/2019, 04/12/2020   Pneumococcal Conjugate-13 04/04/2014, 05/06/2014   Pneumococcal Polysaccharide-23 03/05/2009   Td 11/06/2006   Tdap 11/06/2006, 04/21/2018   Zoster Recombinant(Shingrix) 03/23/2019, 03/24/2019, 06/23/2019   Zoster, Live 01/22/2013, 02/02/2013    Past Medical History:  Diagnosis Date   Anemia    PMH;  Patient denies this dx as of 06/03/21   Anxiety    Asthma    Breast cancer (HCC) 1988   left breast cancer   Collagenous colitis    Compression fracture    lumbar 1   Dyspnea    with exertion   Hypertension    Hypothyroidism    Osteoarthritis    Osteopenia    Pneumonia 07/2020   PONV (postoperative nausea and vomiting)    Sleep apnea    wears CPAP nightly    Tobacco History: Social History   Tobacco Use  Smoking Status Never  Smokeless Tobacco Never   Counseling given: Not Answered   Outpatient Medications Prior to Visit  Medication Sig Dispense Refill   acetaminophen (TYLENOL) 650 MG CR tablet Take 1,300 mg by mouth every 8 (eight) hours as needed for pain.     albuterol (PROVENTIL) (2.5 MG/3ML) 0.083% nebulizer solution Take 3 mLs (2.5 mg dose) by nebulization every 6 (six) hours as needed for Wheezing. 360 mL 0   albuterol (VENTOLIN HFA) 108 (90 Base) MCG/ACT inhaler Inhale 2 puffs into the lungs every 6 (six)  hours as needed for wheezing or shortness of breath. 54 g 3   B Complex-C (B-COMPLEX WITH VITAMIN C) tablet Take 1 tablet by mouth in the morning.     betamethasone dipropionate (DIPROLENE) 0.05 % ointment Apply 1 application  topically 2 (two) times daily as needed (irritation).     Calcium Carb-Cholecalciferol (CALTRATE 600+D3 PO) Take 1 tablet by mouth in the morning and at bedtime.     clobetasol ointment  (TEMOVATE) 0.05 % Apply 1 application topically 2 (two) times daily as needed (irritation).     denosumab (PROLIA) 60 MG/ML SOSY injection Inject 60 mg into the skin every 6 (six) months.     dextromethorphan-guaiFENesin (MUCINEX DM) 30-600 MG 12hr tablet Take 1 tablet by mouth 2 (two) times daily.     diclofenac sodium (VOLTAREN) 1 % GEL Apply 1 application  topically 4 (four) times daily as needed (pain.).     diphenhydrAMINE HCl, Sleep, (ZZZQUIL PO) Take 1 Dose by mouth at bedtime. 2 cupfuls     famotidine (PEPCID) 40 MG tablet Take 40 mg by mouth at bedtime.     fluticasone-salmeterol (ADVAIR) 100-50 MCG/ACT AEPB Inhale 1 puff into the lungs in the morning and at bedtime.     hydrALAZINE (APRESOLINE) 50 MG tablet Take 1 tablet (50 mg total) by mouth 3 (three) times daily. 270 tablet 0   hydrOXYzine (ATARAX) 25 MG tablet Take 25 mg by mouth every 8 (eight) hours as needed for itching.     levocetirizine (XYZAL) 5 MG tablet Take 5 mg by mouth every evening.     levothyroxine (SYNTHROID, LEVOTHROID) 88 MCG tablet Take 88 mcg by mouth daily before breakfast.     losartan (COZAAR) 100 MG tablet Take 100 mg by mouth in the morning.     Multiple Vitamins-Minerals (MULTIVITAMIN WITH MINERALS) tablet Take 1 tablet by mouth daily at 12 noon. Centrum Silver for Women     NON FORMULARY Pt uses a cpap nightly     ORTHOVISC 30 MG/2ML SOSY Inject 6 mLs into the skin every 6 (six) months.     pantoprazole (PROTONIX) 40 MG tablet Take 40 mg by mouth 2 (two) times daily before a meal.     Polyethyl Glycol-Propyl Glycol (SYSTANE) 0.4-0.3 % SOLN Place 1-2 drops into both eyes in the morning and at bedtime.     potassium chloride SA (KLOR-CON M) 20 MEQ tablet Take 20 mEq by mouth 3 (three) times daily.     sertraline (ZOLOFT) 100 MG tablet Take 100 mg by mouth 2 (two) times daily.      tiZANidine (ZANAFLEX) 4 MG tablet Take 4 mg by mouth every 8 (eight) hours as needed for muscle spasms.     traMADol (ULTRAM) 50  MG tablet Take 50 mg by mouth 4 (four) times daily.     diltiazem (CARDIZEM CD) 240 MG 24 hr capsule Take 240 mg by mouth in the morning. (Patient not taking: Reported on 04/12/2023)     furosemide (LASIX) 40 MG tablet Take 40 mg by mouth daily as needed for fluid. (Patient not taking: Reported on 04/12/2023)     No facility-administered medications prior to visit.   Review of Systems  Review of Systems  Constitutional: Negative.  Negative for fever.  HENT:  Positive for congestion and postnasal drip.   Respiratory:  Positive for cough and wheezing.   Cardiovascular: Negative.  Negative for leg swelling.   Physical Exam  BP (!) 157/65   Pulse 76  Ht 4\' 11"  (1.499 m)   Wt 222 lb (100.7 kg)   SpO2 96%   BMI 44.84 kg/m  Physical Exam Constitutional:      Appearance: Normal appearance.  HENT:     Head: Normocephalic and atraumatic.  Cardiovascular:     Rate and Rhythm: Normal rate and regular rhythm.  Pulmonary:     Effort: Pulmonary effort is normal.     Breath sounds: Normal breath sounds.  Neurological:     General: No focal deficit present.     Mental Status: She is alert and oriented to person, place, and time. Mental status is at baseline.  Psychiatric:        Mood and Affect: Mood normal.        Behavior: Behavior normal.        Thought Content: Thought content normal.        Judgment: Judgment normal.      Lab Results:  CBC    Component Value Date/Time   WBC 5.5 03/25/2023 1335   RBC 3.76 (L) 03/25/2023 1335   HGB 12.2 03/25/2023 1335   HGB 12.2 09/02/2020 1528   HGB 14.0 12/17/2014 1435   HCT 38.7 03/25/2023 1335   HCT 36.0 09/02/2020 1528   HCT 41.6 12/17/2014 1435   PLT 215 03/25/2023 1335   PLT 185 09/02/2020 1528   MCV 102.9 (H) 03/25/2023 1335   MCV 99 (H) 09/02/2020 1528   MCV 100.8 12/17/2014 1435   MCH 32.4 03/25/2023 1335   MCHC 31.5 03/25/2023 1335   RDW 15.3 03/25/2023 1335   RDW 12.4 09/02/2020 1528   RDW 13.5 12/17/2014 1435    LYMPHSABS 1.1 06/04/2021 1035   LYMPHSABS 0.9 12/17/2014 1435   MONOABS 0.6 06/04/2021 1035   MONOABS 0.5 12/17/2014 1435   EOSABS 0.3 06/04/2021 1035   EOSABS 0.3 12/17/2014 1435   BASOSABS 0.0 06/04/2021 1035   BASOSABS 0.0 12/17/2014 1435    BMET    Component Value Date/Time   NA 136 03/25/2023 1335   NA 143 12/30/2020 1533   NA 143 12/17/2014 1435   K 4.0 03/25/2023 1335   K 4.0 12/17/2014 1435   CL 103 03/25/2023 1335   CO2 25 03/25/2023 1335   CO2 25 12/17/2014 1435   GLUCOSE 102 (H) 03/25/2023 1335   GLUCOSE 103 12/17/2014 1435   BUN 11 03/25/2023 1335   BUN 18 12/30/2020 1533   BUN 16.9 12/17/2014 1435   CREATININE 0.66 03/25/2023 1335   CREATININE 0.9 12/17/2014 1435   CALCIUM 9.5 03/25/2023 1335   CALCIUM 9.8 12/17/2014 1435   GFRNONAA >60 03/25/2023 1335   GFRAA 62 11/30/2019 1344    BNP    Component Value Date/Time   BNP 53.1 09/02/2020 1528    ProBNP No results found for: "PROBNP"  Imaging: DG Lumbar Spine 2-3 Views  Result Date: 03/31/2023 CLINICAL DATA:  Elective surgery.  L4 kyphoplasty. EXAM: LUMBAR SPINE - 2-3 VIEW COMPARISON:  Lumbar MRI 03/10/2023 FINDINGS: Two fluoroscopic spot views of the lumbar spine obtained in the operating room. Interval kyphoplasty of L4. There is been prior kyphoplasty of L2. Fluoroscopy time 2 minutes 10 seconds. Dose 98.65 mGy. IMPRESSION: Intraoperative fluoroscopy for L4 kyphoplasty. Electronically Signed   By: Narda Rutherford M.D.   On: 03/31/2023 16:04   DG C-Arm 1-60 Min-No Report  Result Date: 03/31/2023 Fluoroscopy was utilized by the requesting physician.  No radiographic interpretation.   DG C-Arm 1-60 Min-No Report  Result Date: 03/31/2023 Fluoroscopy was utilized  by the requesting physician.  No radiographic interpretation.     Assessment & Plan:   Acute bronchitis with asthma Patient has had a cough for the last 2 weeks.  No improvement with doxycycline or prednisone.  Cough is productive with  thick yellow mucus.  On exam she had scattered wheezing and rhonchi R>L.  Patient received Depo-Medrol 80 mg IM x 1 in office. Advised patient continue Advair 100-58mcg one puff q 12 hours, take 1200mg  Mucinex DM twice daily and use albuterol nebulizer every 6 hours for the next 3 to 5 days.  Continue doxycycline and prednisone as directed, depending on chest x-ray results we may add additional antibiotic coverage.   Orders: CXR ON:[id number] bronchitis  Depo-medrol 80mg  IM x 1   Follow-up: Call if symptoms are not better with above plan  Allergic rhinitis - Start Atrovent nasal spray q 12 hours      Glenford Bayley, NP 04/12/2023

## 2023-04-12 NOTE — Assessment & Plan Note (Signed)
-   Start Atrovent nasal spray q 12 hours

## 2023-04-12 NOTE — Progress Notes (Signed)
These let patient know chest x-ray showed mild chronic bronchitis.  No focal pneumonia.  Enlarged heart.  Monitor leg swelling, take Lasix as needed.   Follow directions on AVS during today's visit.  If not better in 3 to 5 days notify [name] and we will readdress antibiotic.

## 2023-04-12 NOTE — Patient Instructions (Addendum)
Recommendations:  Continue Mucinex-dm 1200mg  twice daily (be aware this has the same active ingredient as robitussin, don't take both) You take spoonful of honey to help soothe cough  Continue Advair as directed twice daily Use your nebulizer every 6 hours for the next 3-5 days  Continue doxycycline and prednisone as directed, we may add additional abx if you have pneumonia   Orders: CXR ZO:XWRUEAVWU bronchitis  Depo-medrol 80mg  IM x 1   Follow-up: Call if symptoms are not better with above plan

## 2023-04-12 NOTE — Assessment & Plan Note (Signed)
Patient has had a cough for the last 2 weeks.  No improvement with doxycycline or prednisone.  Cough is productive with thick yellow mucus.  On exam she had scattered wheezing and rhonchi R>L.  Patient received Depo-Medrol 80 mg IM x 1 in office. Advised patient continue Advair 100-38mcg one puff q 12 hours, take 1200mg  Mucinex DM twice daily and use albuterol nebulizer every 6 hours for the next 3 to 5 days.  Continue doxycycline and prednisone as directed, depending on chest x-ray results we may add additional antibiotic coverage.   Orders: CXR ZO:XWRUEAVWU bronchitis  Depo-medrol 80mg  IM x 1   Follow-up: Call if symptoms are not better with above plan

## 2023-04-13 MED ORDER — METHYLPREDNISOLONE ACETATE 80 MG/ML IJ SUSP
80.0000 mg | Freq: Once | INTRAMUSCULAR | Status: DC
Start: 2023-04-13 — End: 2024-05-12

## 2023-04-13 NOTE — Addendum Note (Signed)
Addended by: Carnella Guadalajara on: 04/13/2023 08:11 AM   Modules accepted: Orders

## 2023-04-14 ENCOUNTER — Telehealth: Payer: Self-pay | Admitting: Primary Care

## 2023-04-14 MED ORDER — AZITHROMYCIN 250 MG PO TABS: ORAL_TABLET | ORAL | 0 refills | Status: DC

## 2023-04-14 NOTE — Telephone Encounter (Signed)
Spoke with the pt and notified of response per [facility]  She verbalized understanding

## 2023-04-14 NOTE — Telephone Encounter (Signed)
Patient states still having symptoms of cough and mucus. Has one more antibx left. Was seen 04/12/2023 with Ames Dura NP. Pharmacy is [address] Patient phone number is [phone].

## 2023-04-14 NOTE — Telephone Encounter (Signed)
Spoke with the pt  She was just seen here 04/12/23  She says has one day left on doxy  Her cough is not better- still producing yellow sputum  She is still taking pred taper Was told to call if not improving  She denies fever, hemoptysis  Please advise, thanks!  Allergies  Allergen Reactions   Nebivolol Hcl Other (See Comments)    Hypotensive episode  Bystolic*   Bactrim [Sulfamethoxazole-Trimethoprim] Other (See Comments)    UNSPECIFIED    Dairy Aid [Tilactase] Diarrhea and Other (See Comments)    Ice cream, sour cream   Lactose Intolerance (Gi) Diarrhea   Microplegia Msa-Msg [Cardioplegia Del Nido Formula] Other (See Comments)    Unknown reaction    Adhesive [Tape] Rash   Mobic [Meloxicam] Nausea Only   Other Nausea And Vomiting    general anesthesia   Sulfa Antibiotics Rash

## 2023-04-14 NOTE — Telephone Encounter (Signed)
I'll send in azithromycin. Continue Advair 100-50mcg one puff q 12 hours, take 1200mg  Mucinex DM twice daily and use albuterol nebulizer every 6 hours for the next 3 to 5 days.

## 2023-04-15 ENCOUNTER — Telehealth: Payer: Self-pay | Admitting: Primary Care

## 2023-04-15 DIAGNOSIS — J4530 Mild persistent asthma, uncomplicated: Secondary | ICD-10-CM

## 2023-04-15 NOTE — Telephone Encounter (Signed)
New message   1. Which medications need to be refilled? (please list name of each medication and dose if known) albuterol (PROVENTIL) (2.5 MG/3ML) 0.083% nebulizer solution    2. Which pharmacy/location (including street and city if local pharmacy) is medication to be sent to? [facility] [address]    3. Do they need a 30 day or 90 day supply? 30 days

## 2023-04-16 MED ORDER — ALBUTEROL SULFATE (2.5 MG/3ML) 0.083% IN NEBU
INHALATION_SOLUTION | RESPIRATORY_TRACT | 0 refills | Status: DC
Start: 2023-04-16 — End: 2024-05-12

## 2023-04-16 NOTE — Telephone Encounter (Signed)
Called by the pharmacy regarding patient's albuterol prescription.  I refilled albuterol nebs and sent to [facility] [address]

## 2023-04-18 ENCOUNTER — Ambulatory Visit: Payer: Self-pay | Admitting: Cardiology

## 2023-04-18 ENCOUNTER — Ambulatory Visit: Payer: Medicare HMO

## 2023-04-25 ENCOUNTER — Telehealth: Payer: Self-pay | Admitting: Primary Care

## 2023-04-25 DIAGNOSIS — G4733 Obstructive sleep apnea (adult) (pediatric): Secondary | ICD-10-CM

## 2023-04-25 NOTE — Telephone Encounter (Signed)
Patient needs a prescription for her CPAP to be sent to [facility] because she is eligible for a new one.

## 2023-04-26 NOTE — Telephone Encounter (Signed)
ATC-unable to leave vm due to mailbox not being setup.   Order placed to [facility] 12/2022 for replacement cpap. Did she receive machine?

## 2023-05-03 NOTE — Telephone Encounter (Signed)
Called and spoke with pt to inform pt I will send in another order to [facility], Not sure what happened with the order placed in June. She verbalized understanding nfn

## 2023-06-14 ENCOUNTER — Other Ambulatory Visit: Payer: Self-pay | Admitting: Endocrinology

## 2023-06-14 ENCOUNTER — Encounter: Payer: Self-pay | Admitting: Endocrinology

## 2023-06-14 DIAGNOSIS — M810 Age-related osteoporosis without current pathological fracture: Secondary | ICD-10-CM

## 2023-06-23 ENCOUNTER — Ambulatory Visit: Payer: Medicare HMO | Admitting: Cardiology

## 2023-07-08 ENCOUNTER — Other Ambulatory Visit: Payer: Self-pay

## 2023-07-08 DIAGNOSIS — I10 Essential (primary) hypertension: Secondary | ICD-10-CM

## 2023-07-08 MED ORDER — HYDRALAZINE HCL 50 MG PO TABS: 50.0000 mg | ORAL_TABLET | Freq: Three times a day (TID) | ORAL | 0 refills | Status: DC

## 2023-07-18 ENCOUNTER — Telehealth: Payer: Self-pay | Admitting: Primary Care

## 2023-07-18 NOTE — Telephone Encounter (Signed)
 Patient has not been given her CPAP and needs for the prescription to be sent to [facility]. They have not received it.

## 2023-07-20 ENCOUNTER — Encounter: Payer: Self-pay | Admitting: Cardiology

## 2023-07-20 ENCOUNTER — Ambulatory Visit: Payer: Medicare HMO | Attending: Cardiology | Admitting: Cardiology

## 2023-07-20 VITALS — BP 130/76 | HR 76 | Resp 16 | Ht 59.0 in | Wt 226.4 lb

## 2023-07-20 DIAGNOSIS — N1831 Chronic kidney disease, stage 3a: Secondary | ICD-10-CM | POA: Diagnosis not present

## 2023-07-20 DIAGNOSIS — I358 Other nonrheumatic aortic valve disorders: Secondary | ICD-10-CM | POA: Diagnosis not present

## 2023-07-20 DIAGNOSIS — I10 Essential (primary) hypertension: Secondary | ICD-10-CM

## 2023-07-20 DIAGNOSIS — I5032 Chronic diastolic (congestive) heart failure: Secondary | ICD-10-CM | POA: Diagnosis not present

## 2023-07-20 NOTE — Patient Instructions (Signed)
 Medication Instructions:  Your physician recommends that you continue on your current medications as directed. Please refer to the Current Medication list given to you today.  *If you need a refill on your cardiac medications before your next appointment, please call your pharmacy*   Lab Work: none If you have labs (blood work) drawn today and your tests are completely normal, you will receive your results only by: MyChart Message (if you have MyChart) OR A paper copy in the mail If you have any lab test that is abnormal or we need to change your treatment, we will call you to review the results.   Testing/Procedures: Your physician has requested that you have an echocardiogram. Echocardiography is a painless test that uses sound waves to create images of your heart. It provides your doctor with information about the size and shape of your heart and how well your heart's chambers and valves are working. This procedure takes approximately one hour. There are no restrictions for this procedure. Please do NOT wear cologne, perfume, aftershave, or lotions (deodorant is allowed). Please arrive 15 minutes prior to your appointment time.  Please note: We ask at that you not bring children with you during ultrasound (echo/ vascular) testing. Due to room size and safety concerns, children are not allowed in the ultrasound rooms during exams. Our front office staff cannot provide observation of children in our lobby area while testing is being conducted. An adult accompanying a patient to their appointment will only be allowed in the ultrasound room at the discretion of the ultrasound technician under special circumstances. We apologize for any inconvenience.    Follow-Up: At [facility], you and your health needs are our priority.  As part of our continuing mission to provide you with exceptional heart care, we have created designated Provider Care Teams.  These Care Teams include your  primary Cardiologist (physician) and Advanced Practice Providers (APPs -  Physician Assistants and Nurse Practitioners) who all work together to provide you with the care you need, when you need it.  We recommend signing up for the patient portal called "MyChart".  Sign up information is provided on this After Visit Summary.  MyChart is used to connect with patients for Virtual Visits (Telemedicine).  Patients are able to view lab/test results, encounter notes, upcoming appointments, etc.  Non-urgent messages can be sent to your provider as well.   To learn more about what you can do with MyChart, go to [email or web address].    Your next appointment:   As needed  Provider:   Yates Decamp, MD     Other Instructions

## 2023-07-20 NOTE — Progress Notes (Signed)
 Cardiology Office Note:  .   Date:  07/20/2023  ID:  Modesto Andreas, DOB 1941-05-10, MRN [id number] PCP: Jearlean Mince, PA-C  [facility] Providers Cardiologist:  Knox Perl, MD   History of Present Illness: .   [name] is a 83 y.o. Caucasian female with hypothyroidism, resistant hypertension, depression/anxiety, OSA on CPAP, history of left breast cancer, hypocalcemia, CKD stage 3. She did not tolerate Bisoprolol , labetalol  due to worsening asthma and Aldactone  caused worsening renal function.  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  Her main issue has been back pain.  Labs   Lab Results  Component Value Date   NA 136 03/25/2023   K 4.0 03/25/2023   CO2 25 03/25/2023   GLUCOSE 102 (H) 03/25/2023   BUN 11 03/25/2023   CREATININE 0.66 03/25/2023   CALCIUM  9.5 03/25/2023   EGFR 55 (L) 12/30/2020   GFRNONAA >60 03/25/2023      Latest Ref Rng & Units 03/25/2023    1:35 PM 12/10/2021    1:26 AM 12/08/2021    8:08 PM  BMP  Glucose 70 - 99 mg/dL 657  846  962   BUN 8 - 23 mg/dL 11  13  15    Creatinine 0.44 - 1.00 mg/dL 9.52  8.41  3.24   Sodium 135 - 145 mmol/L 136  138  141   Potassium 3.5 - 5.1 mmol/L 4.0  3.6  3.1   Chloride 98 - 111 mmol/L 103  107  104   CO2 22 - 32 mmol/L 25  23  25    Calcium  8.9 - 10.3 mg/dL 9.5  8.5  9.9       Latest Ref Rng & Units 03/25/2023    1:35 PM 12/10/2021    1:26 AM 12/08/2021    8:08 PM  CBC  WBC 4.0 - 10.5 K/uL 5.5  9.3  9.9   Hemoglobin 12.0 - 15.0 g/dL 40.1  02.7  25.3   Hematocrit 36.0 - 46.0 % 38.7  34.9  40.1   Platelets 150 - 400 K/uL 215  160  196    External Labs:  Review of Systems  Cardiovascular:  Negative for chest pain, dyspnea on exertion and leg swelling.  Musculoskeletal:  Positive for arthritis and joint pain.    Physical  Exam:   VS:  BP 130/76 (BP Location: Right Arm, Patient Position: Sitting, Cuff Size: Large)   Pulse 76   Resp 16   Ht 4\' 11"  (1.499 m)   Wt 226 lb 6.4 oz (102.7 kg)   SpO2 96%   BMI 45.73 kg/m    Wt Readings from Last 3 Encounters:  07/20/23 226 lb 6.4 oz (102.7 kg)  04/12/23 222 lb (100.7 kg)  03/31/23 222 lb (100.7 kg)  Physical Exam Constitutional:      Appearance: She is obese.  Neck:     Vascular: No JVD.  Cardiovascular:     Rate and Rhythm: Normal rate and regular rhythm.     Pulses: Intact distal pulses.     Heart sounds: S1 normal and S2 normal. Murmur heard.     Early systolic murmur is present with a grade of 2/6 at the upper right sternal border.     No gallop.  Pulmonary:     Effort: Pulmonary effort is  normal.     Breath sounds: Normal breath sounds.  Abdominal:     General: Bowel sounds are normal.     Palpations: Abdomen is soft.  Musculoskeletal:     Right lower leg: No edema.     Left lower leg: No edema.    Studies Reviewed: .    Echocardiogram 06/22/2021:    Normal LV systolic function with EF 57%. Left ventricle cavity is normal in size. Moderate concentric remodeling of the left ventricle. Normal global wall motion. Doppler evidence of grade I (impaired) diastolic dysfunction, elevated LAP. E/e' 15.1 with E/A 0.8 and DT 232. Calculated EF 57%.  Left atrial cavity is mildly dilated.  Trace mitral regurgitation. Mild calcification of the mitral valve annulus. No evidence of mitral valve stenosis.  No significant change from 12/07/2019.   EKG:    EKG Interpretation Date/Time:  Wednesday July 20 2023 16:34:15 EST Ventricular Rate:  84 PR Interval:  192 QRS Duration:  86 QT Interval:  396 QTC Calculation: 467 R Axis:   -9  Text Interpretation: EKG 07/20/2023: Normal sinus rhythm at rate of 84 bpm, poor R progression, probably normal variant.  Normal EKG.  Compared to 08/25/2018, no significant change. Confirmed by Rolin Schult, Jagadeesh ([id number]) on  07/20/2023 4:50:18 PM     Medications and allergies    Allergies  Allergen Reactions   Nebivolol  Hcl Other (See Comments)    Hypotensive episode  Bystolic *   Bactrim [Sulfamethoxazole-Trimethoprim] Other (See Comments)    UNSPECIFIED    Dairy Aid [Tilactase] Diarrhea and Other (See Comments)    Ice cream, sour cream   Lactose Intolerance (Gi) Diarrhea   Microplegia Msa-Msg [Cardioplegia Del Nido Formula] Other (See Comments)    Unknown reaction    Adhesive [Tape] Rash   Mobic [Meloxicam] Nausea Only   Other Nausea And Vomiting    general anesthesia   Sulfa Antibiotics Rash          Current Outpatient Medications:    acetaminophen  (TYLENOL ) 650 MG CR tablet, Take 1,300 mg by mouth every 8 (eight) hours as needed for pain., Disp: , Rfl:    albuterol  (PROVENTIL ) (2.5 MG/3ML) 0.083% nebulizer solution, Take 3 mLs (2.5 mg dose) by nebulization every 6 (six) hours as needed for Wheezing., Disp: 360 mL, Rfl: 0   albuterol  (VENTOLIN  HFA) 108 (90 Base) MCG/ACT inhaler, Inhale 2 puffs into the lungs every 6 (six) hours as needed for wheezing or shortness of breath., Disp: 54 g, Rfl: 3   B Complex-C (B-COMPLEX WITH VITAMIN C) tablet, Take 1 tablet by mouth in the morning., Disp: , Rfl:    betamethasone  dipropionate (DIPROLENE ) 0.05 % ointment, Apply 1 application  topically 2 (two) times daily as needed (irritation)., Disp: , Rfl:    Calcium  Carb-Cholecalciferol  (CALTRATE 600+D3 PO), Take 1 tablet by mouth in the morning and at bedtime., Disp: , Rfl:    clobetasol ointment (TEMOVATE) 0.05 %, Apply 1 application topically 2 (two) times daily as needed (irritation)., Disp: , Rfl:    denosumab  (PROLIA ) 60 MG/ML SOSY injection, Inject 60 mg into the skin every 6 (six) months., Disp: , Rfl:    dextromethorphan-guaiFENesin (MUCINEX DM) 30-600 MG 12hr tablet, Take 1 tablet by mouth 2 (two) times daily., Disp: , Rfl:    diclofenac sodium (VOLTAREN) 1 % GEL, Apply 1 application  topically 4 (four)  times daily as needed (pain.)., Disp: , Rfl:    diltiazem  (CARDIZEM  CD) 240 MG 24 hr capsule, Take 240 mg by mouth in  the morning., Disp: , Rfl:    diphenhydrAMINE  HCl, Sleep, (ZZZQUIL PO), Take 1 Dose by mouth at bedtime. 2 cupfuls, Disp: , Rfl:    famotidine (PEPCID) 40 MG tablet, Take 40 mg by mouth at bedtime., Disp: , Rfl:    fluticasone -salmeterol (ADVAIR ) 100-50 MCG/ACT AEPB, Inhale 1 puff into the lungs in the morning and at bedtime., Disp: , Rfl:    furosemide  (LASIX ) 40 MG tablet, Take 40 mg by mouth daily as needed for fluid., Disp: , Rfl:    hydrALAZINE  (APRESOLINE ) 50 MG tablet, Take 1 tablet (50 mg total) by mouth 3 (three) times daily., Disp: 45 tablet, Rfl: 0   ipratropium (ATROVENT ) 0.03 % nasal spray, Place 2 sprays into both nostrils every 12 (twelve) hours., Disp: 30 mL, Rfl: 12   levothyroxine  (SYNTHROID , LEVOTHROID) 88 MCG tablet, Take 88 mcg by mouth daily before breakfast., Disp: , Rfl:    losartan  (COZAAR ) 100 MG tablet, Take 100 mg by mouth in the morning., Disp: , Rfl:    Multiple Vitamins-Minerals (MULTIVITAMIN WITH MINERALS) tablet, Take 1 tablet by mouth daily at 12 noon. Centrum Silver for Women, Disp: , Rfl:    NON FORMULARY, Pt uses a cpap nightly, Disp: , Rfl:    ORTHOVISC 30 MG/2ML SOSY, Inject 6 mLs into the skin every 6 (six) months., Disp: , Rfl:    pantoprazole (PROTONIX) 40 MG tablet, Take 40 mg by mouth 2 (two) times daily., Disp: , Rfl:    Polyethyl Glycol-Propyl Glycol (SYSTANE) 0.4-0.3 % SOLN, Place 1-2 drops into both eyes in the morning and at bedtime., Disp: , Rfl:    potassium chloride  SA (KLOR-CON  M) 20 MEQ tablet, Take 20 mEq by mouth 3 (three) times daily., Disp: , Rfl:    sertraline  (ZOLOFT ) 100 MG tablet, Take 100 mg by mouth 2 (two) times daily. , Disp: , Rfl:    traMADol  (ULTRAM ) 50 MG tablet, Take 50 mg by mouth 4 (four) times daily., Disp: , Rfl:   Current Facility-Administered Medications:    methylPREDNISolone  acetate (DEPO-MEDROL )  injection 80 mg, 80 mg, Intramuscular, Once, Antonio Baumgarten, NP   ASSESSMENT AND PLAN: .      ICD-10-CM   1. Primary hypertension  I10 EKG 12-Lead    2. Chronic diastolic heart failure (HCC)  V40.98 ECHOCARDIOGRAM COMPLETE    3. Stage 3a chronic kidney disease (HCC)  N18.31     4. Aortic systolic murmur on examination  I35.8 ECHOCARDIOGRAM COMPLETE      1. Primary hypertension (Primary) Blood pressure under excellent control with a combination of diltiazem  CD to 40 mg daily, hydralazine  50 mg 3 times daily, losartan  100 mg daily and heart failure symptoms are also well-controlled with the above along with furosemide  40 mg daily with potassium supplement. - EKG 12-Lead  2. Chronic diastolic heart failure (HCC) She has not had any recent hospitalization.  Her weight has been stable, no significant leg edema, no PND or orthopnea.  No JVD on physical exam.  3. Stage 3a chronic kidney disease (HCC) Reviewed external labs, stage IIIa chronic kidney disease has remained stable.  She is tolerating losartan  100 mg daily.  4. Aortic systolic murmur on examination She does have aortic stenotic/sclerotic murmur that is new to her, will obtain an echocardiogram.  Unless this is abnormal, I will see her back on a as needed basis  Signed,  Knox Perl, MD, [facility] 07/20/2023, 8:57 PM [facility] [address] [place], [place] [address] Phone: [phone].  Fax:  [phone]

## 2023-07-21 NOTE — Telephone Encounter (Signed)
Can you check on patient's cpap. Order has been placed twice and patient has not received equipment.

## 2023-07-22 ENCOUNTER — Other Ambulatory Visit: Payer: Self-pay

## 2023-07-22 DIAGNOSIS — I10 Essential (primary) hypertension: Secondary | ICD-10-CM

## 2023-07-22 MED ORDER — HYDRALAZINE HCL 50 MG PO TABS: 50.0000 mg | ORAL_TABLET | Freq: Three times a day (TID) | ORAL | 3 refills | Status: DC

## 2023-08-12 ENCOUNTER — Ambulatory Visit (HOSPITAL_COMMUNITY): Payer: Medicare HMO | Attending: Cardiology

## 2023-08-12 DIAGNOSIS — I5032 Chronic diastolic (congestive) heart failure: Secondary | ICD-10-CM | POA: Diagnosis not present

## 2023-08-12 DIAGNOSIS — I358 Other nonrheumatic aortic valve disorders: Secondary | ICD-10-CM | POA: Insufficient documentation

## 2023-08-12 LAB — ECHOCARDIOGRAM COMPLETE
AR max vel: 3.08 cm2
AV Area VTI: 3.07 cm2
AV Area mean vel: 3.11 cm2
AV Mean grad: 10.4 mm[Hg]
AV Peak grad: 19.1 mm[Hg]
Ao pk vel: 2.19 m/s
Area-P 1/2: 3.54 cm2
S' Lateral: 2.45 cm

## 2023-08-14 ENCOUNTER — Encounter: Payer: Self-pay | Admitting: Cardiology

## 2023-08-14 NOTE — Progress Notes (Signed)
 Essentially normal echocardiogram for age.  Very mild diastolic dysfunction.  Diastolic function means the heart has to relax to receive the blood so it can pump the blood out. If relaxation is impaired, then the blood coming to the heart is restricted and can cause dyspnea and reduced functional capacity and fluid build up. Exercise, weight loss, control of BP, salt restriction will improve this.

## 2023-08-15 ENCOUNTER — Telehealth: Payer: Self-pay | Admitting: Primary Care

## 2023-08-15 NOTE — Telephone Encounter (Signed)
 Please see last two signed encounters where we reordered the CPAP from [facility]. Been going on since June. PT still does not have CPAP and has not heard anything. Her # is [phone] Please please call to advise PT status

## 2023-08-15 NOTE — Telephone Encounter (Signed)
 Message sent to [facility].

## 2023-08-16 NOTE — Telephone Encounter (Signed)
I have spoke with Penni Homans with [facility] and she has everything she needs and will hand it to [place] for him to get the patient scheduled

## 2023-08-30 ENCOUNTER — Ambulatory Visit: Payer: Medicare HMO | Admitting: Primary Care

## 2023-08-30 NOTE — Progress Notes (Deleted)
 @Patient  ID: [name], female    DOB: 1941/05/19, 83 y.o.   MRN: [id number]  No chief complaint on file.   Referring provider: Dani Gobble, Demetrius Charity*  HPI: 83 year old female, smoked.  Past medical history significant itching, asthma, OSA, allergic rhinitis, hypothyroidism, osteopenia, breast cancer, obesity.  Former patient of Dr. Craige Cotta.  Previous [facility] pulmonary encounter: 04/12/2023 Patient presents today for acute OV due to bronchitis symptoms. Seen by PCP and placed on Doxycyline and prednisone, she feels no better since starting antibiotic. Her asthma is typically well-controlled on Advair 100-50 mcg twice daily. She tells me her symptoms started at the same time as her husband. She developed productive cough around September 26th. Cough is worse in the morning, she is getting up thick yellow mucus. Associated PND and nocturnal wheezing.  Has not taken her Lasix for a couple of days, reports no increased leg swelling.  Denies fever/chills/sweats.   08/30/2023- Interim hx  Patient presents today for CPAP follow-up.    Airview download 07/30/2023 - 08/28/2023 Usage days 30/30 days (100%) greater than 4 hours Average usage 10 hours 18 minutes Pressure 11 cm H2O Air leaks 2.6 L/min (95%) AHI 2.9    Allergies  Allergen Reactions   Nebivolol Hcl Other (See Comments)    Hypotensive episode  Bystolic*   Bactrim [Sulfamethoxazole-Trimethoprim] Other (See Comments)    UNSPECIFIED    Dairy Aid [Tilactase] Diarrhea and Other (See Comments)    Ice cream, sour cream   Lactose Intolerance (Gi) Diarrhea   Microplegia Msa-Msg [Cardioplegia [place] Nido Formula] Other (See Comments)    Unknown reaction    Adhesive [Tape] Rash   Mobic [Meloxicam] Nausea Only   Other Nausea And Vomiting    general anesthesia   Sulfa Antibiotics Rash         Immunization History  Administered Date(s) Administered   Fluad Quad(high Dose 65+) 04/29/2016, 04/18/2017, 03/16/2019   H1N1 04/05/2011    Influenza Split 03/09/2013   Influenza Whole 04/15/2012   Influenza, High Dose Seasonal PF 04/29/2016, 03/07/2018, 03/20/2019, 03/24/2020, 05/04/2020   Influenza, Quadrivalent, Recombinant, Inj, Pf 04/20/2021   Influenza,inj,Quad PF,6+ Mos 04/04/2014   Influenza-Unspecified 05/04/2017   Moderna Covid-19 Fall Seasonal Vaccine 73yrs & older 10/30/2022   Moderna Covid-19 Vaccine Bivalent Booster 32yrs & up 03/13/2021   Moderna Sars-Cov-2 Peds vaccine 72yrs thru 80yrs 03/13/2021   PFIZER Comirnaty(Gray Top)Covid-19 Tri-Sucrose Vaccine 11/28/2020   PFIZER(Purple Top)SARS-COV-2 Vaccination 07/17/2019, 08/06/2019, 11/14/2019, 04/12/2020   Pfizer(Comirnaty)Fall Seasonal Vaccine 12 years and older 04/09/2022   Pneumococcal Conjugate-13 04/04/2014, 05/06/2014   Pneumococcal Polysaccharide-23 03/05/2009   Td 11/06/2006   Tdap 11/06/2006, 04/21/2018   Zoster Recombinant(Shingrix) 03/23/2019, 03/24/2019, 06/23/2019   Zoster, Live 01/22/2013, 02/02/2013    Past Medical History:  Diagnosis Date   Anemia    PMH;  Patient denies this dx as of 06/03/21   Anxiety    Asthma    Breast cancer (HCC) 1988   left breast cancer   Collagenous colitis    Compression fracture    lumbar 1   Dyspnea    with exertion   Hypertension    Hypothyroidism    Osteoarthritis    Osteopenia    Pneumonia 07/2020   PONV (postoperative nausea and vomiting)    Sleep apnea    wears CPAP nightly    Tobacco History: Social History   Tobacco Use  Smoking Status Never  Smokeless Tobacco Never   Counseling given: Not Answered   Outpatient Medications Prior to Visit  Medication Sig Dispense Refill   acetaminophen (TYLENOL) 650 MG CR tablet Take 1,300 mg by mouth every 8 (eight) hours as needed for pain.     albuterol (PROVENTIL) (2.5 MG/3ML) 0.083% nebulizer solution Take 3 mLs (2.5 mg dose) by nebulization every 6 (six) hours as needed for Wheezing. 360 mL 0   albuterol (VENTOLIN HFA) 108 (90 Base) MCG/ACT  inhaler Inhale 2 puffs into the lungs every 6 (six) hours as needed for wheezing or shortness of breath. 54 g 3   B Complex-C (B-COMPLEX WITH VITAMIN C) tablet Take 1 tablet by mouth in the morning.     betamethasone dipropionate (DIPROLENE) 0.05 % ointment Apply 1 application  topically 2 (two) times daily as needed (irritation).     Calcium Carb-Cholecalciferol (CALTRATE 600+D3 PO) Take 1 tablet by mouth in the morning and at bedtime.     clobetasol ointment (TEMOVATE) 0.05 % Apply 1 application topically 2 (two) times daily as needed (irritation).     denosumab (PROLIA) 60 MG/ML SOSY injection Inject 60 mg into the skin every 6 (six) months.     dextromethorphan-guaiFENesin (MUCINEX DM) 30-600 MG 12hr tablet Take 1 tablet by mouth 2 (two) times daily.     diclofenac sodium (VOLTAREN) 1 % GEL Apply 1 application  topically 4 (four) times daily as needed (pain.).     diltiazem (CARDIZEM CD) 240 MG 24 hr capsule Take 240 mg by mouth in the morning.     diphenhydrAMINE HCl, Sleep, (ZZZQUIL PO) Take 1 Dose by mouth at bedtime. 2 cupfuls     famotidine (PEPCID) 40 MG tablet Take 40 mg by mouth at bedtime.     fluticasone-salmeterol (ADVAIR) 100-50 MCG/ACT AEPB Inhale 1 puff into the lungs in the morning and at bedtime.     furosemide (LASIX) 40 MG tablet Take 40 mg by mouth daily as needed for fluid.     hydrALAZINE (APRESOLINE) 50 MG tablet Take 1 tablet (50 mg total) by mouth 3 (three) times daily. 270 tablet 3   ipratropium (ATROVENT) 0.03 % nasal spray Place 2 sprays into both nostrils every 12 (twelve) hours. 30 mL 12   levothyroxine (SYNTHROID, LEVOTHROID) 88 MCG tablet Take 88 mcg by mouth daily before breakfast.     losartan (COZAAR) 100 MG tablet Take 100 mg by mouth in the morning.     Multiple Vitamins-Minerals (MULTIVITAMIN WITH MINERALS) tablet Take 1 tablet by mouth daily at 12 noon. Centrum Silver for Women     NON FORMULARY Pt uses a cpap nightly     ORTHOVISC 30 MG/2ML SOSY Inject 6  mLs into the skin every 6 (six) months.     pantoprazole (PROTONIX) 40 MG tablet Take 40 mg by mouth 2 (two) times daily.     Polyethyl Glycol-Propyl Glycol (SYSTANE) 0.4-0.3 % SOLN Place 1-2 drops into both eyes in the morning and at bedtime.     potassium chloride SA (KLOR-CON M) 20 MEQ tablet Take 20 mEq by mouth 3 (three) times daily.     sertraline (ZOLOFT) 100 MG tablet Take 100 mg by mouth 2 (two) times daily.      traMADol (ULTRAM) 50 MG tablet Take 50 mg by mouth 4 (four) times daily.     Facility-Administered Medications Prior to Visit  Medication Dose Route Frequency Provider Last Rate Last Admin   methylPREDNISolone acetate (DEPO-MEDROL) injection 80 mg  80 mg Intramuscular Once Glenford Bayley, NP          Review of Systems  Review of Systems   Physical Exam  There were no vitals taken for this visit. Physical Exam   Lab Results:  CBC    Component Value Date/Time   WBC 5.5 03/25/2023 1335   RBC 3.76 (L) 03/25/2023 1335   HGB 12.2 03/25/2023 1335   HGB 12.2 09/02/2020 1528   HGB 14.0 12/17/2014 1435   HCT 38.7 03/25/2023 1335   HCT 36.0 09/02/2020 1528   HCT 41.6 12/17/2014 1435   PLT 215 03/25/2023 1335   PLT 185 09/02/2020 1528   MCV 102.9 (H) 03/25/2023 1335   MCV 99 (H) 09/02/2020 1528   MCV 100.8 12/17/2014 1435   MCH 32.4 03/25/2023 1335   MCHC 31.5 03/25/2023 1335   RDW 15.3 03/25/2023 1335   RDW 12.4 09/02/2020 1528   RDW 13.5 12/17/2014 1435   LYMPHSABS 1.1 06/04/2021 1035   LYMPHSABS 0.9 12/17/2014 1435   MONOABS 0.6 06/04/2021 1035   MONOABS 0.5 12/17/2014 1435   EOSABS 0.3 06/04/2021 1035   EOSABS 0.3 12/17/2014 1435   BASOSABS 0.0 06/04/2021 1035   BASOSABS 0.0 12/17/2014 1435    BMET    Component Value Date/Time   NA 136 03/25/2023 1335   NA 143 12/30/2020 1533   NA 143 12/17/2014 1435   K 4.0 03/25/2023 1335   K 4.0 12/17/2014 1435   CL 103 03/25/2023 1335   CO2 25 03/25/2023 1335   CO2 25 12/17/2014 1435   GLUCOSE 102  (H) 03/25/2023 1335   GLUCOSE 103 12/17/2014 1435   BUN 11 03/25/2023 1335   BUN 18 12/30/2020 1533   BUN 16.9 12/17/2014 1435   CREATININE 0.66 03/25/2023 1335   CREATININE 0.9 12/17/2014 1435   CALCIUM 9.5 03/25/2023 1335   CALCIUM 9.8 12/17/2014 1435   GFRNONAA >60 03/25/2023 1335   GFRAA 62 11/30/2019 1344    BNP    Component Value Date/Time   BNP 53.1 09/02/2020 1528    ProBNP No results found for: "PROBNP"  Imaging: ECHOCARDIOGRAM COMPLETE Result Date: 08/12/2023    ECHOCARDIOGRAM REPORT   Patient Name:   [name] Date of Exam: 08/12/2023 Medical Rec #:  [id number]       Height:       59.0 in Accession #:    [id number]      Weight:       226.4 lb Date of Birth:  January 15, 1941      BSA:          1.944 m Patient Age:    82 years        BP:           126/76 mmHg Patient Gender: F               HR:           78 bpm. Exam Location:  [address] Procedure: 2D Echo, 3D Echo, Cardiac Doppler and Color Doppler Indications:    R01.1 Murmur  History:        Patient has prior history of Echocardiogram examinations, most                 recent 06/22/2021. CHF, Signs/Symptoms:Murmur, Shortness of                 Breath and Dyspnea; Risk Factors:Hypertension and Sleep Apnea.                 Left Breast Cancer status post Radical Mastectomy and Lat-Flap  Reconstruction (1988), Right Breast Reduction, Asthma, Obesity.  Sonographer:    Farrel Conners RDCS Referring Phys: Yates Decamp IMPRESSIONS  1. Left ventricular ejection fraction, by estimation, is 65 to 70%. The left ventricle has normal function. The left ventricle has no regional wall motion abnormalities. There is mild asymmetric left ventricular hypertrophy of the basal-septal segment. Left ventricular diastolic parameters are consistent with Grade I diastolic dysfunction (impaired relaxation).  2. Right ventricular systolic function is normal. The right ventricular size is normal.  3. Left atrial size was mild to moderately  dilated.  4. The mitral valve is normal in structure. Trivial mitral valve regurgitation. No evidence of mitral stenosis.  5. The aortic valve is tricuspid. There is mild calcification of the aortic valve. Aortic valve regurgitation is not visualized. Aortic valve sclerosis/calcification is present, without any evidence of aortic stenosis.  6. The inferior vena cava is normal in size with greater than 50% respiratory variability, suggesting right atrial pressure of 3 mmHg. FINDINGS  Left Ventricle: Left ventricular ejection fraction, by estimation, is 65 to 70%. The left ventricle has normal function. The left ventricle has no regional wall motion abnormalities. The left ventricular internal cavity size was normal in size. There is  mild asymmetric left ventricular hypertrophy of the basal-septal segment. Left ventricular diastolic parameters are consistent with Grade I diastolic dysfunction (impaired relaxation). Right Ventricle: The right ventricular size is normal. No increase in right ventricular wall thickness. Right ventricular systolic function is normal. Left Atrium: Left atrial size was mild to moderately dilated. Right Atrium: Right atrial size was normal in size. Pericardium: There is no evidence of pericardial effusion. Mitral Valve: The mitral valve is normal in structure. Mild mitral annular calcification. Trivial mitral valve regurgitation. No evidence of mitral valve stenosis. Tricuspid Valve: The tricuspid valve is normal in structure. Tricuspid valve regurgitation is mild . No evidence of tricuspid stenosis. Aortic Valve: The aortic valve is tricuspid. There is mild calcification of the aortic valve. Aortic valve regurgitation is not visualized. Aortic valve sclerosis/calcification is present, without any evidence of aortic stenosis. Aortic valve mean gradient measures 10.4 mmHg. Aortic valve peak gradient measures 19.1 mmHg. Aortic valve area, by VTI measures 3.07 cm. Pulmonic Valve: The pulmonic  valve was normal in structure. Pulmonic valve regurgitation is not visualized. No evidence of pulmonic stenosis. Aorta: The aortic root is normal in size and structure. Venous: The inferior vena cava is normal in size with greater than 50% respiratory variability, suggesting right atrial pressure of 3 mmHg. IAS/Shunts: No atrial level shunt detected by color flow Doppler.  LEFT VENTRICLE PLAX 2D LVIDd:         4.65 cm   Diastology LVIDs:         2.45 cm   LV e' medial:    6.85 cm/s LV PW:         1.20 cm   LV E/e' medial:  16.5 LV IVS:        0.95 cm   LV e' lateral:   8.59 cm/s LVOT diam:     2.60 cm   LV E/e' lateral: 13.1 LV SV:         138 LV SV Index:   71 LVOT Area:     5.31 cm                           3D Volume EF:  3D EF:        68 %                          LV EDV:       112 ml                          LV ESV:       35 ml                          LV SV:        76 ml RIGHT VENTRICLE RV Basal diam:  3.40 cm RV S prime:     16.80 cm/s TAPSE (M-mode): 2.1 cm RVSP:           16.7 mmHg LEFT ATRIUM              Index        RIGHT ATRIUM           Index LA diam:        4.20 cm  2.16 cm/m   RA Pressure: 3.00 mmHg LA Vol (A2C):   102.0 ml 52.47 ml/m  RA Area:     16.90 cm LA Vol (A4C):   81.2 ml  41.77 ml/m  RA Volume:   41.30 ml  21.24 ml/m LA Biplane Vol: 92.2 ml  47.42 ml/m  AORTIC VALVE AV Area (Vmax):    3.08 cm AV Area (Vmean):   3.11 cm AV Area (VTI):     3.07 cm AV Vmax:           218.80 cm/s AV Vmean:          146.600 cm/s AV VTI:            0.450 m AV Peak Grad:      19.1 mmHg AV Mean Grad:      10.4 mmHg LVOT Vmax:         127.00 cm/s LVOT Vmean:        85.967 cm/s LVOT VTI:          0.260 m LVOT/AV VTI ratio: 0.58  AORTA Ao Root diam: 3.00 cm Ao Asc diam:  3.70 cm MITRAL VALVE                TRICUSPID VALVE MV Area (PHT): cm          TR Peak grad:   13.7 mmHg MV Decel Time: 215 msec     TR Vmax:        185.00 cm/s MV E velocity: 112.75 cm/s  Estimated RAP:  3.00 mmHg MV  A velocity: 122.25 cm/s  RVSP:           16.7 mmHg MV E/A ratio:  0.92                             SHUNTS                             Systemic VTI:  0.26 m                             Systemic Diam: 2.60 cm Arvilla Meres MD Electronically signed by Arvilla Meres MD Signature Date/Time: 08/12/2023/2:02:49 PM  Final      Assessment & Plan:   No problem-specific Assessment & Plan notes found for this encounter.     Glenford Bayley, NP 08/30/2023

## 2023-10-10 ENCOUNTER — Ambulatory Visit (INDEPENDENT_AMBULATORY_CARE_PROVIDER_SITE_OTHER): Payer: Medicare HMO | Admitting: Primary Care

## 2023-10-10 ENCOUNTER — Encounter: Payer: Self-pay | Admitting: Primary Care

## 2023-10-10 VITALS — BP 132/67 | HR 88 | Temp 97.7°F | Ht 59.0 in | Wt 219.0 lb

## 2023-10-10 DIAGNOSIS — J4530 Mild persistent asthma, uncomplicated: Secondary | ICD-10-CM | POA: Diagnosis not present

## 2023-10-10 DIAGNOSIS — G4733 Obstructive sleep apnea (adult) (pediatric): Secondary | ICD-10-CM | POA: Diagnosis not present

## 2023-10-10 MED ORDER — FLUTICASONE-SALMETEROL 100-50 MCG/ACT IN AEPB: 1.0000 | INHALATION_SPRAY | Freq: Two times a day (BID) | RESPIRATORY_TRACT | 5 refills | Status: DC

## 2023-10-10 NOTE — Progress Notes (Signed)
 @Patient  ID: [name], female    DOB: 12/06/1940, 83 y.o.   MRN: [id number]  Chief Complaint  Patient presents with   Follow-up    Asthma and OSA F/U    Referring provider: Dani Gobble, Demetrius Charity*  HPI: 83 year old female, smoked.  Past medical history significant itching, asthma, OSA, allergic rhinitis, hypothyroidism, osteopenia, breast cancer, obesity. Former patient of Dr. Craige Cotta.  Previous [facility] pulmonary encounter:  04/12/2023 Patient presents today for acute OV due to bronchitis symptoms. Seen by PCP and placed on Doxycyline and prednisone, she feels no better since starting antibiotic. Her asthma is typically well-controlled on Advair 100-50 mcg twice daily. She tells me her symptoms started at the same time as her husband. She developed productive cough around September 26th. Cough is worse in the morning, she is getting up thick yellow mucus. Associated PND and nocturnal wheezing.  Has not taken her Lasix for a couple of days, reports no increased leg swelling.  Denies fever/chills/sweats.    10/10/2023- Interim hx  Discussed the use of AI scribe software for clinical note transcription with the patient, who gave verbal consent to proceed.   History of Present Illness   The patient, with asthma and sleep apnea, presents for follow-up of asthma and sleep apnea management.  She is managing her asthma with Advair 100 - one puff in the morning and one puff at bedtime. There was initial confusion about the dosing, as she thought it was once a day, but it is confirmed to be twice a day. Her asthma symptoms are present but manageable, with pollen exacerbating her condition. She experienced hoarseness recently, which she attributes to allergies. She has used her rescue inhaler once in the past month.  She has a history of sleep apnea and uses a CPAP machine consistently, averaging ten hours of use per night. Her apnea score is 2.9, indicating well-controlled sleep apnea. She  recently received a new CPAP machine in March and is compliant with its use.  She experiences hoarseness and symptoms likely related to allergic rhinitis, exacerbated by pollen. She currently takes Benadryl for allergies.  She takes Protonix for a past ulcer in the duodenum and continues to use it as part of her treatment regimen. She recalls a heart murmur being detected during tests related to her ulcer but does not remember if an echocardiogram was performed.      Airview download 08/01/23-08/30/23 30/30 days > 4 hours Average usage 10 hours 20 mins Pressure 11cm h20 Airleaks 2.8L/min (95%)  AHI 2.9   Allergies  Allergen Reactions   Nebivolol Hcl Other (See Comments)    Hypotensive episode  Bystolic*   Bactrim [Sulfamethoxazole-Trimethoprim] Other (See Comments)    UNSPECIFIED    Dairy Aid [Tilactase] Diarrhea and Other (See Comments)    Ice cream, sour cream   Lactose Intolerance (Gi) Diarrhea   Microplegia Msa-Msg [Cardioplegia [place] Nido Formula] Other (See Comments)    Unknown reaction    Adhesive [Tape] Rash   Mobic [Meloxicam] Nausea Only   Other Nausea And Vomiting    general anesthesia   Sulfa Antibiotics Rash         Immunization History  Administered Date(s) Administered   Fluad Quad(high Dose 65+) 04/29/2016, 04/18/2017, 03/16/2019   H1N1 04/05/2011   Influenza Split 03/09/2013   Influenza Whole 04/15/2012   Influenza, High Dose Seasonal PF 04/29/2016, 03/07/2018, 03/20/2019, 03/24/2020, 05/04/2020   Influenza, Quadrivalent, Recombinant, Inj, Pf 04/20/2021   Influenza,inj,Quad PF,6+ Mos 04/04/2014   Influenza-Unspecified  05/04/2017   Moderna Covid-19 Fall Seasonal Vaccine 22yrs & older 10/30/2022   Moderna Covid-19 Vaccine Bivalent Booster 22yrs & up 03/13/2021   Moderna Sars-Cov-2 Peds vaccine 61yrs thru 55yrs 03/13/2021   PFIZER Comirnaty(Gray Top)Covid-19 Tri-Sucrose Vaccine 11/28/2020   PFIZER(Purple Top)SARS-COV-2 Vaccination 07/17/2019, 08/06/2019,  11/14/2019, 04/12/2020   Pfizer(Comirnaty)Fall Seasonal Vaccine 12 years and older 04/09/2022   Pneumococcal Conjugate-13 04/04/2014, 05/06/2014   Pneumococcal Polysaccharide-23 03/05/2009   Td 11/06/2006   Tdap 11/06/2006, 04/21/2018   Zoster Recombinant(Shingrix) 03/23/2019, 03/24/2019, 06/23/2019   Zoster, Live 01/22/2013, 02/02/2013    Past Medical History:  Diagnosis Date   Anemia    PMH;  Patient denies this dx as of 06/03/21   Anxiety    Asthma    Breast cancer (HCC) 1988   left breast cancer   Collagenous colitis    Compression fracture    lumbar 1   Dyspnea    with exertion   Hypertension    Hypothyroidism    Osteoarthritis    Osteopenia    Pneumonia 07/2020   PONV (postoperative nausea and vomiting)    Sleep apnea    wears CPAP nightly    Tobacco History: Social History   Tobacco Use  Smoking Status Never  Smokeless Tobacco Never   Counseling given: Not Answered   Outpatient Medications Prior to Visit  Medication Sig Dispense Refill   acetaminophen (TYLENOL) 650 MG CR tablet Take 1,300 mg by mouth every 8 (eight) hours as needed for pain.     albuterol (PROVENTIL) (2.5 MG/3ML) 0.083% nebulizer solution Take 3 mLs (2.5 mg dose) by nebulization every 6 (six) hours as needed for Wheezing. 360 mL 0   albuterol (VENTOLIN HFA) 108 (90 Base) MCG/ACT inhaler Inhale 2 puffs into the lungs every 6 (six) hours as needed for wheezing or shortness of breath. 54 g 3   B Complex-C (B-COMPLEX WITH VITAMIN C) tablet Take 1 tablet by mouth in the morning.     betamethasone dipropionate (DIPROLENE) 0.05 % ointment Apply 1 application  topically 2 (two) times daily as needed (irritation).     Calcium Carb-Cholecalciferol (CALTRATE 600+D3 PO) Take 1 tablet by mouth in the morning and at bedtime.     clobetasol ointment (TEMOVATE) 0.05 % Apply 1 application topically 2 (two) times daily as needed (irritation).     denosumab (PROLIA) 60 MG/ML SOSY injection Inject 60 mg into  the skin every 6 (six) months.     diclofenac sodium (VOLTAREN) 1 % GEL Apply 1 application  topically 4 (four) times daily as needed (pain.).     diltiazem (CARDIZEM CD) 240 MG 24 hr capsule Take 240 mg by mouth in the morning.     diphenhydrAMINE HCl, Sleep, (ZZZQUIL PO) Take 1 Dose by mouth at bedtime. 2 cupfuls     famotidine (PEPCID) 40 MG tablet Take 40 mg by mouth at bedtime.     fluticasone-salmeterol (ADVAIR) 100-50 MCG/ACT AEPB Inhale 1 puff into the lungs in the morning and at bedtime.     furosemide (LASIX) 40 MG tablet Take 40 mg by mouth daily as needed for fluid.     hydrALAZINE (APRESOLINE) 50 MG tablet Take 1 tablet (50 mg total) by mouth 3 (three) times daily. 270 tablet 3   levothyroxine (SYNTHROID, LEVOTHROID) 88 MCG tablet Take 88 mcg by mouth daily before breakfast.     losartan (COZAAR) 100 MG tablet Take 100 mg by mouth in the morning.     Multiple Vitamins-Minerals (MULTIVITAMIN WITH MINERALS) tablet Take  1 tablet by mouth daily at 12 noon. Centrum Silver for Women     NON FORMULARY Pt uses a cpap nightly     ORTHOVISC 30 MG/2ML SOSY Inject 6 mLs into the skin every 6 (six) months.     pantoprazole (PROTONIX) 40 MG tablet Take 40 mg by mouth 2 (two) times daily.     Polyethyl Glycol-Propyl Glycol (SYSTANE) 0.4-0.3 % SOLN Place 1-2 drops into both eyes in the morning and at bedtime.     potassium chloride SA (KLOR-CON M) 20 MEQ tablet Take 20 mEq by mouth 3 (three) times daily.     sertraline (ZOLOFT) 100 MG tablet Take 100 mg by mouth 2 (two) times daily.      traMADol (ULTRAM) 50 MG tablet Take 50 mg by mouth 4 (four) times daily.     dextromethorphan-guaiFENesin (MUCINEX DM) 30-600 MG 12hr tablet Take 1 tablet by mouth 2 (two) times daily.     ipratropium (ATROVENT) 0.03 % nasal spray Place 2 sprays into both nostrils every 12 (twelve) hours. 30 mL 12   Facility-Administered Medications Prior to Visit  Medication Dose Route Frequency Provider Last Rate Last Admin    methylPREDNISolone acetate (DEPO-MEDROL) injection 80 mg  80 mg Intramuscular Once Glenford Bayley, NP       Review of Systems  Review of Systems  Constitutional: Negative.   HENT:  Positive for postnasal drip and voice change.   Respiratory: Negative.    Cardiovascular: Negative.     Physical Exam  BP 132/67 (BP Location: Right Arm, Patient Position: Sitting, Cuff Size: Large)   Pulse 88   Temp 97.7 F (36.5 C) (Temporal)   Ht 4\' 11"  (1.499 m)   Wt 219 lb (99.3 kg)   SpO2 96%   BMI 44.23 kg/m  Physical Exam Constitutional:      General: She is not in acute distress.    Appearance: Normal appearance. She is not ill-appearing.  HENT:     Head: Normocephalic and atraumatic.     Mouth/Throat:     Mouth: Mucous membranes are moist.     Pharynx: Oropharynx is clear.  Cardiovascular:     Rate and Rhythm: Normal rate and regular rhythm.  Pulmonary:     Effort: Pulmonary effort is normal.     Breath sounds: Normal breath sounds.  Skin:    General: Skin is warm and Jennings.  Neurological:     General: No focal deficit present.     Mental Status: She is alert and oriented to person, place, and time. Mental status is at baseline.  Psychiatric:        Mood and Affect: Mood normal.        Behavior: Behavior normal.        Thought Content: Thought content normal.        Judgment: Judgment normal.      Lab Results:  CBC    Component Value Date/Time   WBC 5.5 03/25/2023 1335   RBC 3.76 (L) 03/25/2023 1335   HGB 12.2 03/25/2023 1335   HGB 12.2 09/02/2020 1528   HGB 14.0 12/17/2014 1435   HCT 38.7 03/25/2023 1335   HCT 36.0 09/02/2020 1528   HCT 41.6 12/17/2014 1435   PLT 215 03/25/2023 1335   PLT 185 09/02/2020 1528   MCV 102.9 (H) 03/25/2023 1335   MCV 99 (H) 09/02/2020 1528   MCV 100.8 12/17/2014 1435   MCH 32.4 03/25/2023 1335   MCHC 31.5 03/25/2023 1335   RDW  15.3 03/25/2023 1335   RDW 12.4 09/02/2020 1528   RDW 13.5 12/17/2014 1435   LYMPHSABS 1.1 06/04/2021  1035   LYMPHSABS 0.9 12/17/2014 1435   MONOABS 0.6 06/04/2021 1035   MONOABS 0.5 12/17/2014 1435   EOSABS 0.3 06/04/2021 1035   EOSABS 0.3 12/17/2014 1435   BASOSABS 0.0 06/04/2021 1035   BASOSABS 0.0 12/17/2014 1435    BMET    Component Value Date/Time   NA 136 03/25/2023 1335   NA 143 12/30/2020 1533   NA 143 12/17/2014 1435   K 4.0 03/25/2023 1335   K 4.0 12/17/2014 1435   CL 103 03/25/2023 1335   CO2 25 03/25/2023 1335   CO2 25 12/17/2014 1435   GLUCOSE 102 (H) 03/25/2023 1335   GLUCOSE 103 12/17/2014 1435   BUN 11 03/25/2023 1335   BUN 18 12/30/2020 1533   BUN 16.9 12/17/2014 1435   CREATININE 0.66 03/25/2023 1335   CREATININE 0.9 12/17/2014 1435   CALCIUM 9.5 03/25/2023 1335   CALCIUM 9.8 12/17/2014 1435   GFRNONAA >60 03/25/2023 1335   GFRAA 62 11/30/2019 1344    BNP    Component Value Date/Time   BNP 53.1 09/02/2020 1528    ProBNP No results found for: "PROBNP"  Imaging: No results found.   Assessment & Plan:   1. OSA (obstructive sleep apnea) (Primary)  2. Mild persistent asthma without complication  Assessment and Plan    Asthma Asthma managed with Advair. Correct dosing to twice daily may improve symptoms. Rescue inhaler used once in past month indicates good control. - Instruct to use Advair 100-48mcg one puff morning and evening, rinse mouth after use. - Advise follow-up every six months unless acutely ill.  Allergic Rhinitis Hoarseness and allergy symptoms managed with Benadryl, not ideal for long-term use. Zyrtec recommended for better control. - Recommend Zyrtec 10 mg once daily during allergy season for 4-6 weeks. - Advise purchasing over-the-counter medications from [employer]. - Continue Benadryl as needed for acute symptoms.  Obstructive Sleep Apnea Obstructive sleep apnea well-controlled with CPAP 11cm h20. Residual AHI of 2.9 indicates good control. Compliance data monitored. - Schedule virtual follow-up in two months  to review CPAP compliance data.  Gastroesophageal Reflux Disease (GERD) GERD managed with Protonix. Hoarseness may be related to reflux. - Continue Protonix as prescribed.  Follow-up Establish care with new physician due to previous doctor's departure. - Schedule new patient visit with Dr. Wynona Neat in six months. - Arrange virtual follow-up in two months to review CPAP compliance in 6 weeks     Glenford Bayley, NP 10/10/2023

## 2023-10-10 NOTE — Patient Instructions (Signed)
 -  ASTHMA: Asthma is a condition where your airways narrow and swell, making it difficult to breathe. You should use Advair one puff in the morning and one puff in the evening, and rinse your mouth after each use. Your prescription has been updated to 60 doses with 5 refills. Follow up every six months unless you feel acutely ill.  -ALLERGIC RHINITIS: Allergic rhinitis is an allergic reaction that causes sneezing, congestion, and a runny nose. You should take Zyrtec 10 mg once daily during allergy season for 4-6 weeks. Continue using Benadryl as needed for acute symptoms. These medications can be purchased over-the-counter at [employer].  -OBSTRUCTIVE SLEEP APNEA: Obstructive sleep apnea is a condition where your breathing stops and starts during sleep. Your condition is well-controlled with your CPAP machine, and your apnea score is 2.9. We will have a virtual follow-up in two months to review your CPAP compliance data.  -GASTROESOPHAGEAL REFLUX DISEASE (GERD): GERD is a condition where stomach acid frequently flows back into the tube connecting your mouth and stomach, causing irritation. Continue taking Protonix as prescribed to manage your symptoms.  INSTRUCTIONS: Please schedule a new patient visit with Dr. Wynona Neat in six months. Additionally, arrange a virtual follow-up in two months to review your CPAP compliance data.  Follow-up: - Virtual vsiit with [name] in 6-8 weeks (Friday afternoon) - 6 months with Dr. Wynona Neat (30 min new patient OSA/Asthma)

## 2023-10-18 ENCOUNTER — Other Ambulatory Visit: Payer: Self-pay | Admitting: Cardiology

## 2023-11-10 ENCOUNTER — Other Ambulatory Visit: Payer: Self-pay | Admitting: Orthopedic Surgery

## 2023-11-10 ENCOUNTER — Telehealth: Payer: Self-pay

## 2023-11-10 ENCOUNTER — Encounter: Payer: Self-pay | Admitting: Orthopedic Surgery

## 2023-11-10 DIAGNOSIS — M19211 Secondary osteoarthritis, right shoulder: Secondary | ICD-10-CM

## 2023-11-10 NOTE — Telephone Encounter (Signed)
 med rec and consent     Patient Consent for Virtual Visit        [name] has provided verbal consent on 11/10/2023 for a virtual visit (video or telephone).   CONSENT FOR VIRTUAL VISIT FOR:  [name]  By participating in this virtual visit I agree to the following:  I hereby voluntarily request, consent and authorize [facility] and its employed or contracted physicians, physician assistants, nurse practitioners or other licensed health care professionals (the Practitioner), to provide me with telemedicine health care services (the "Services") as deemed necessary by the treating Practitioner. I acknowledge and consent to receive the Services by the Practitioner via telemedicine. I understand that the telemedicine visit will involve communicating with the Practitioner through live audiovisual communication technology and the disclosure of certain medical information by electronic transmission. I acknowledge that I have been given the opportunity to request an in-person assessment or other available alternative prior to the telemedicine visit and am voluntarily participating in the telemedicine visit.  I understand that I have the right to withhold or withdraw my consent to the use of telemedicine in the course of my care at any time, without affecting my right to future care or treatment, and that the Practitioner or I may terminate the telemedicine visit at any time. I understand that I have the right to inspect all information obtained and/or recorded in the course of the telemedicine visit and may receive copies of available information for a reasonable fee.  I understand that some of the potential risks of receiving the Services via telemedicine include:  Delay or interruption in medical evaluation due to technological equipment failure or disruption; Information transmitted may not be sufficient (e.g. poor resolution of images) to allow for appropriate medical decision  making by the Practitioner; and/or  In rare instances, security protocols could fail, causing a breach of personal health information.  Furthermore, I acknowledge that it is my responsibility to provide information about my medical history, conditions and care that is complete and accurate to the best of my ability. I acknowledge that Practitioner's advice, recommendations, and/or decision may be based on factors not within their control, such as incomplete or inaccurate data provided by me or distortions of diagnostic images or specimens that may result from electronic transmissions. I understand that the practice of medicine is not an exact science and that Practitioner makes no warranties or guarantees regarding treatment outcomes. I acknowledge that a copy of this consent can be made available to me via my patient portal [facility] MyChart), or I can request a printed copy by calling the office of  [facility].    I understand that my insurance will be billed for this visit.   I have read or had this consent read to me. I understand the contents of this consent, which adequately explains the benefits and risks of the Services being provided via telemedicine.  I have been provided ample opportunity to ask questions regarding this consent and the Services and have had my questions answered to my satisfaction. I give my informed consent for the services to be provided through the use of telemedicine in my medical care

## 2023-11-10 NOTE — Telephone Encounter (Signed)
   Name: [name]  DOB: 1940-09-22  MRN: [id number]  Primary Cardiologist: Knox Perl, MD   Preoperative team, please contact this patient and set up a phone call appointment for further preoperative risk assessment. Please obtain consent and complete medication review. Thank you for your help.  I confirm that guidance regarding antiplatelet and oral anticoagulation therapy has been completed and, if necessary, noted below.  None requested  I also confirmed the patient resides in the state of [place] . As per [facility] Medical Board telemedicine laws, the patient must reside in the state in which the provider is licensed.   Morey Ar, NP 11/10/2023, 3:54 PM [facility]

## 2023-11-10 NOTE — Telephone Encounter (Signed)
 S/W pt and scheduled TELE preop appt for 11/16/23.  med rec and consent

## 2023-11-10 NOTE — Telephone Encounter (Signed)
   Pre-operative Risk Assessment    Patient Name: [name]  DOB: 08-Aug-1940 MRN: [id number]   Date of last office visit: 07/20/2023 with Dr. Berry Bristol Date of next office visit: None   Request for Surgical Clearance    Procedure:  Right Reverse Total Shoulder Arthroplasty   Date of Surgery:  Clearance 12/15/23                                Surgeon:  Dr. Sammye Cristal  Surgeon's Group or Practice Name:  [facility]  Phone number:  [phone] Fax number:  [phone]   Type of Clearance Requested:   - Medical    Type of Anesthesia:  Choice    Additional requests/questions:    SignedSudie Ely   11/10/2023, 2:59 PM

## 2023-11-14 ENCOUNTER — Ambulatory Visit
Admission: RE | Admit: 2023-11-14 | Discharge: 2023-11-14 | Disposition: A | Discharge status: Home / Self Care | Source: Ambulatory Visit | Attending: Orthopedic Surgery | Admitting: Orthopedic Surgery

## 2023-11-14 DIAGNOSIS — M19211 Secondary osteoarthritis, right shoulder: Secondary | ICD-10-CM

## 2023-11-16 ENCOUNTER — Ambulatory Visit: Attending: Cardiology | Admitting: Emergency Medicine

## 2023-11-16 ENCOUNTER — Telehealth: Payer: Self-pay | Admitting: Primary Care

## 2023-11-16 DIAGNOSIS — Z01810 Encounter for preprocedural cardiovascular examination: Secondary | ICD-10-CM

## 2023-11-16 NOTE — Telephone Encounter (Signed)
 Fax received from Dr. Sammye Cristal with [facility] to perform a right reverase total shoulder arthroplasty under choice anesthesia on patient.  Patient needs surgery clearance. Surgery is 12/15/23. Patient was seen on 10/10/23. Office protocol is a risk assessment can be sent to surgeon if patient has been seen in 60 days or less.   Sending to [facility] for risk assessment or recommendations if patient needs to be seen in office prior to surgical procedure. '

## 2023-11-16 NOTE — Progress Notes (Signed)
 Virtual Visit via Telephone Note   Because of [name] co-morbid illnesses, she is at least at moderate risk for complications without adequate follow up.  This format is felt to be most appropriate for this patient at this time.  Due to technical limitations with video connection Web designer), today's appointment will be conducted as an audio only telehealth visit, and [name] verbally agreed to proceed in this manner.   All issues noted in this document were discussed and addressed.  No physical exam could be performed with this format.  Evaluation Performed:  Preoperative cardiovascular risk assessment _____________   Date:  11/16/2023   Patient ID:  [name], DOB 06/23/1941, MRN [id number] Patient Location:  Home Provider location:   Office  Primary Care Provider:  Jearlean Mince, PA-C Primary Cardiologist:  Knox Perl, MD  Chief Complaint / Patient Profile   83 y.o. y/o female with a h/o chronic diastolic heart failure, hypothyroidism, resistant hypertension, depression/anxiety, OSA on CPAP, history of breast cancer, hypocalcemia, CKD stage III who is pending right reverse total shoulder arthroplasty on 12/15/2023 with [facility] by Dr. Deeann Fare and presents today for telephonic preoperative cardiovascular risk assessment.  History of Present Illness    [name] is a 83 y.o. female who presents via audio/video conferencing for a telehealth visit today.  Pt was last seen in cardiology clinic on 07/20/2023 by Dr. Berry Bristol.  At that time [name] was doing well.  The patient is now pending procedure as outlined above. Since her last visit, she denies chest pain, shortness of breath, lower extremity edema, fatigue, palpitations, melena, hematuria, hemoptysis, diaphoresis, weakness, presyncope, syncope, orthopnea, and PND.  Today patient is doing well overall.  She is without any acute cardiovascular concerns or complaints.  She does stay  relatively active but due to her knee pain this does limit her.  She does use a walker at times however she is able to walk at least a block and up a flight of stairs.  She does plan to start tai chi soon to help with her balance.  She is without any anginal symptoms today.  She is able to complete greater than 4 METS.  Past Medical History    Past Medical History:  Diagnosis Date   Anemia    PMH;  Patient denies this dx as of 06/03/21   Anxiety    Asthma    Breast cancer (HCC) 1988   left breast cancer   Collagenous colitis    Compression fracture    lumbar 1   Dyspnea    with exertion   Hypertension    Hypothyroidism    Osteoarthritis    Osteopenia    Pneumonia 07/2020   PONV (postoperative nausea and vomiting)    Sleep apnea    wears CPAP nightly   Past Surgical History:  Procedure Laterality Date   BREAST REDUCTION SURGERY     Right   CATARACT EXTRACTION W/ INTRAOCULAR LENS  IMPLANT, BILATERAL     CESAREAN SECTION     COLONOSCOPY W/ BIOPSIES AND POLYPECTOMY     CYST EXCISION N/A 08/30/2018   Procedure: excision of posterior shoulder / back sebaceous cyst;  Surgeon: Thornell Flirt, DO;  Location:  [facility];  Service: Plastics;  Laterality: N/A;   KNEE ARTHROSCOPY Left 02/23/2022   Procedure: LEFT KNEE ARTHROSCOPY;  Surgeon: Dayne Even, MD;  Location: [facility];  Service: Orthopedics;  Laterality: Left;   KYPHOPLASTY N/A 02/18/2016  Procedure: LUMBAR 1 KYPHOPLASTY;  Surgeon: Virl Grimes, MD;  Location: [facility];  Service: Orthopedics;  Laterality: N/A;  LUMBAR 1 KYPHOPLASTY   KYPHOPLASTY N/A 06/04/2021   Procedure: LUMBAR 2 KYPHOPLASTY;  Surgeon: Virl Grimes, MD;  Location: [facility];  Service: Orthopedics;  Laterality: N/A;   KYPHOPLASTY N/A 03/31/2023   Procedure: LUMBAR 4 KYPHOPLASTY;  Surgeon: Virl Grimes, MD;  Location: [facility];  Service: Orthopedics;  Laterality: N/A;   LAPAROSCOPIC APPENDECTOMY N/A 12/09/2021   Procedure: APPENDECTOMY  LAPAROSCOPIC;  Surgeon: Melvenia Stabs, MD;  Location: [facility];  Service: General;  Laterality: N/A;   LUMBAR DISC SURGERY     MODIFIED RADICAL MASTECTOMY W/ AXILLARY LYMPH NODE DISSECTION     Left   RECONSTRUCTION BREAST W/ LATISSIMUS DORSI FLAP     left   REDUCTION MAMMAPLASTY Right    right foot surgery     corrected hammer toe, straightened big toe   SEPTOPLASTY     TOE SURGERY     TONSILLECTOMY AND ADENOIDECTOMY      Allergies  Allergies  Allergen Reactions   Nebivolol  Hcl Other (See Comments)    Hypotensive episode  Bystolic *   Bactrim [Sulfamethoxazole-Trimethoprim] Other (See Comments)    UNSPECIFIED    Dairy Aid [Tilactase] Diarrhea and Other (See Comments)    Ice cream, sour cream   Lactose Intolerance (Gi) Diarrhea   Microplegia Msa-Msg [Cardioplegia Del Nido Formula] Other (See Comments)    Unknown reaction    Adhesive [Tape] Rash   Mobic [Meloxicam] Nausea Only   Other Nausea And Vomiting    general anesthesia   Sulfa Antibiotics Rash         Home Medications    Prior to Admission medications   Medication Sig Start Date End Date Taking? Authorizing Provider  acetaminophen  (TYLENOL ) 650 MG CR tablet Take 1,300 mg by mouth every 8 (eight) hours as needed for pain.    [provider]  albuterol  (PROVENTIL ) (2.5 MG/3ML) 0.083% nebulizer solution Take 3 mLs (2.5 mg dose) by nebulization every 6 (six) hours as needed for Wheezing. 04/16/23   Denson Flake, MD  albuterol  (VENTOLIN  HFA) 108 (90 Base) MCG/ACT inhaler Inhale 2 puffs into the lungs every 6 (six) hours as needed for wheezing or shortness of breath. 12/23/22   Wilder Handy, MD  B Complex-C (B-COMPLEX WITH VITAMIN C) tablet Take 1 tablet by mouth in the morning.    [provider]  betamethasone  dipropionate (DIPROLENE ) 0.05 % ointment Apply 1 application  topically 2 (two) times daily as needed (irritation).    [provider]  Calcium  Carb-Cholecalciferol  (CALTRATE  600+D3 PO) Take 1 tablet by mouth in the morning and at bedtime.    [provider]  clobetasol ointment (TEMOVATE) 0.05 % Apply 1 application topically 2 (two) times daily as needed (irritation).    [provider]  denosumab  (PROLIA ) 60 MG/ML SOSY injection Inject 60 mg into the skin every 6 (six) months.    [provider]  diclofenac sodium (VOLTAREN) 1 % GEL Apply 1 application  topically 4 (four) times daily as needed (pain.). 04/10/14   [provider]  diltiazem  (CARDIZEM  CD) 240 MG 24 hr capsule Take 240 mg by mouth in the morning.    [provider]  diphenhydrAMINE  HCl, Sleep, (ZZZQUIL PO) Take 1 Dose by mouth at bedtime. 2 cupfuls    [provider]  famotidine (PEPCID) 40 MG tablet Take 40 mg by mouth at bedtime.    [provider]  fluticasone -salmeterol (ADVAIR ) 100-50 MCG/ACT AEPB Inhale 1 puff into the lungs in the morning and at bedtime. 10/10/23   Antonio Baumgarten, NP  furosemide  (LASIX ) 40 MG tablet Take 40 mg by mouth daily as needed for fluid. 10/03/21   [provider]  hydrALAZINE  (APRESOLINE ) 50 MG tablet Take 1 tablet (50 mg total) by mouth 3 (three) times daily. 07/22/23   Knox Perl, MD  levothyroxine  (SYNTHROID , LEVOTHROID) 88 MCG tablet Take 88 mcg by mouth daily before breakfast.    [provider]  losartan  (COZAAR ) 100 MG tablet Take 100 mg by mouth in the morning.    [provider]  Multiple Vitamins-Minerals (MULTIVITAMIN WITH MINERALS) tablet Take 1 tablet by mouth daily at 12 noon. Centrum Silver for Women    [provider]  NON FORMULARY Pt uses a cpap nightly    [provider]  ORTHOVISC 30 MG/2ML SOSY Inject 6 mLs into the skin every 6 (six) months. 11/10/20   [provider]  pantoprazole (PROTONIX) 40 MG tablet Take 40 mg by mouth 2 (two) times daily.    [provider]  Polyethyl Glycol-Propyl Glycol (SYSTANE) 0.4-0.3 % SOLN Place 1-2  drops into both eyes in the morning and at bedtime.    [provider]  potassium chloride  SA (KLOR-CON  M20) 20 MEQ tablet TAKE 1 TABLET BY MOUTH 3 TIMES A DAY 10/19/23   Knox Perl, MD  sertraline  (ZOLOFT ) 100 MG tablet Take 100 mg by mouth 2 (two) times daily.     [provider]  traMADol  (ULTRAM ) 50 MG tablet Take 50 mg by mouth 4 (four) times daily. 12/06/22   [provider]    Physical Exam    Vital Signs:  [name] does not have vital signs available for review today.  Given telephonic nature of communication, physical exam is limited. AAOx3. NAD. Normal affect.  Speech and respirations are unlabored.  Accessory Clinical Findings    None  Assessment & Plan    1.  Preoperative Cardiovascular Risk Assessment: According to the Revised Cardiac Risk Index (RCRI), her Perioperative Risk of Major Cardiac Event is (%): 0.9. Her Functional Capacity in METs is: 5.07 according to the Duke Activity Status Index (DASI). Therefore, based on ACC/AHA guidelines, patient would be at acceptable risk for the planned procedure without further cardiovascular testing.  The patient was advised that if she develops new symptoms prior to surgery to contact our office to arrange for a follow-up visit, and she verbalized understanding.  A copy of this note will be routed to requesting surgeon.  Time:   Today, I have spent 9 minutes with the patient with telehealth technology discussing medical history, symptoms, and management plan.     Ava Boatman, NP  11/16/2023, 3:43 PM

## 2023-11-18 ENCOUNTER — Telehealth: Admitting: Primary Care

## 2023-11-18 DIAGNOSIS — G4733 Obstructive sleep apnea (adult) (pediatric): Secondary | ICD-10-CM | POA: Diagnosis not present

## 2023-11-18 DIAGNOSIS — K219 Gastro-esophageal reflux disease without esophagitis: Secondary | ICD-10-CM | POA: Diagnosis not present

## 2023-11-18 DIAGNOSIS — J45909 Unspecified asthma, uncomplicated: Secondary | ICD-10-CM

## 2023-11-18 NOTE — Progress Notes (Signed)
 Virtual Visit via Video Note  I connected with [name] on 11/18/23 at  2:00 PM EDT by a video enabled telemedicine application and verified that I am speaking with the correct person using two identifiers.  Location: Patient: Home Provider: Office    I discussed the limitations of evaluation and management by telemedicine and the availability of in person appointments. The patient expressed understanding and agreed to proceed.  History of Present Illness:  83 year old female, smoked.  Past medical history significant itching, asthma, OSA, allergic rhinitis, hypothyroidism, osteopenia, breast cancer, obesity. Former patient of Dr. Matilde Son.  Previous [facility] pulmonary encounter:  04/12/2023 Patient presents today for acute OV due to bronchitis symptoms. Seen by PCP and placed on Doxycyline and prednisone , she feels no better since starting antibiotic. Her asthma is typically well-controlled on Advair  100-50 mcg twice daily. She tells me her symptoms started at the same time as her husband. She developed productive cough around September 26th. Cough is worse in the morning, she is getting up thick yellow mucus. Associated PND and nocturnal wheezing.  Has not taken her Lasix  for a couple of days, reports no increased leg swelling.  Denies fever/chills/sweats.    10/10/2023- Interim hx  Discussed the use of AI scribe software for clinical note transcription with the patient, who gave verbal consent to proceed.   History of Present Illness   The patient, with asthma and sleep apnea, presents for follow-up of asthma and sleep apnea management.  She is managing her asthma with Advair  100 -50mcg one puff in the morning and one puff at bedtime. There was initial confusion about the dosing, as she thought it was once a day, but it is confirmed to be twice a day. Her asthma symptoms are present but manageable, with pollen exacerbating her condition. She experienced hoarseness recently, which she attributes  to allergies. She has used her rescue inhaler once in the past month.  She has a history of sleep apnea and uses a CPAP machine consistently, averaging ten hours of use per night. Her apnea score is 2.9, indicating well-controlled sleep apnea. She recently received a new CPAP machine in March and is compliant with its use.  She experiences hoarseness and symptoms likely related to allergic rhinitis, exacerbated by pollen. She currently takes Benadryl  for allergies.  She takes Protonix for a past ulcer in the duodenum and continues to use it as part of her treatment regimen. She recalls a heart murmur being detected during tests related to her ulcer but does not remember if an echocardiogram was performed.      Airview download 08/01/23-08/30/23 30/30 days > 4 hours Average usage 10 hours 20 mins Pressure 11cm h20 Airleaks 2.8L/min (95%)  AHI 2.9       -ASTHMA: Asthma is a condition where your airways narrow and swell, making it difficult to breathe. You should use Advair  one puff in the morning and one puff in the evening, and rinse your mouth after each use. Your prescription has been updated to 60 doses with 5 refills. Follow up every six months unless you feel acutely ill.   -ALLERGIC RHINITIS: Allergic rhinitis is an allergic reaction that causes sneezing, congestion, and a runny nose. You should take Zyrtec 10 mg once daily during allergy season for 4-6 weeks. Continue using Benadryl  as needed for acute symptoms. These medications can be purchased over-the-counter at [employer].   -OBSTRUCTIVE SLEEP APNEA: Obstructive sleep apnea is a condition where your breathing stops and starts during sleep.  Your condition is well-controlled with your CPAP machine, and your apnea score is 2.9. We will have a virtual follow-up in two months to review your CPAP compliance data.   -GASTROESOPHAGEAL REFLUX DISEASE (GERD): GERD is a condition where stomach acid frequently flows back into the tube  connecting your mouth and stomach, causing irritation. Continue taking Protonix as prescribed to manage your symptoms.   INSTRUCTIONS: Please schedule a new patient visit with Dr. Gaynell Keeler in six months. Additionally, arrange a virtual follow-up in two months to review your CPAP compliance data.   Follow-up: - Virtual vsiit with [name] in 6-8 weeks (Friday afternoon) - 6 months with Dr. Gaynell Keeler (30 min new patient OSA/Asthma)    11/18/2023- Interim hx  Discussed the use of AI scribe software for clinical note transcription with the patient, who gave verbal consent to proceed.  History of Present Illness   [name] is an 83 year old female who presents for a follow-up visit to evaluate her new CPAP machine.  She has been using the new CPAP machine consistently every night for an average of 10 hours and 47 minutes from April 15th to May 14th. The machine is set at a pressure of 11, and her apnea score is 4.1. She experiences no issues with the new CPAP machine, noting it is more compact than her previous one.  She has a history of gastroesophageal reflux disease and is currently taking pantoprazole and famotidine. Her hoarseness, which was likely seasonal, has resolved. She is currently taking Xyzal instead of Zyrtec during allergy season.  She also has a history of asthma and is using Advair  twice a day. She initially used it once a day but increased to twice a day per providers recommendation.     Airview download 10/18/23-11/16/23 Usage 30/30 days (100%) Usage 10 hours 47 mins Pressure 11cm h20 Airleaks 0.5Lmin (95%) AHI 4.1    Observations/Objective:  Appears well without overt respiratory symptoms  Assessment and Plan:  Assessment and Plan    Asthma Symptoms are well managed with Advair . Hoarseness has improved, possibly due to increased Advair  usage. - Continue Advair  twice daily.  Obstructive sleep apnea, well controlled Well controlled with consistent CPAP use.  Received new machine. Recent download shows 100% usage every night for an average of 10 hours and 47 minutes with an apnea score of 4.1. No issues with the new CPAP machine. - Continue using CPAP machine nightly.  Gastroesophageal reflux disease Hoarseness has resolved, likely due to seasonal allergies. Continued use of reflux medications. - Continue pantoprazole and famotidine as prescribed.   Follow Up Instructions:  6 months    I discussed the assessment and treatment plan with the patient. The patient was provided an opportunity to ask questions and all were answered. The patient agreed with the plan and demonstrated an understanding of the instructions.   The patient was advised to call back or seek an in-person evaluation if the symptoms worsen or if the condition fails to improve as anticipated.  I provided 22 minutes of non-face-to-face time during this encounter.   Antonio Baumgarten, NP

## 2023-11-25 ENCOUNTER — Other Ambulatory Visit: Payer: Self-pay | Admitting: Physician Assistant

## 2023-11-25 DIAGNOSIS — N649 Disorder of breast, unspecified: Secondary | ICD-10-CM

## 2023-11-29 ENCOUNTER — Other Ambulatory Visit: Payer: Self-pay | Admitting: Physician Assistant

## 2023-11-29 DIAGNOSIS — N649 Disorder of breast, unspecified: Secondary | ICD-10-CM

## 2023-12-07 ENCOUNTER — Encounter (HOSPITAL_COMMUNITY): Payer: Self-pay

## 2023-12-07 NOTE — Patient Instructions (Addendum)
 SURGICAL WAITING ROOM VISITATION  Patients having surgery or a procedure may have no more than 2 support people in the waiting area - these visitors may rotate.    Children under the age of 33 must have an adult with them who is not the patient.  Visitors with respiratory illnesses are discouraged from visiting and should remain at home.  If the patient needs to stay at the hospital during part of their recovery, the visitor guidelines for inpatient rooms apply. Pre-op nurse will coordinate an appropriate time for 1 support person to accompany patient in pre-op.  This support person may not rotate.    Please refer to the [facility] website for the visitor guidelines for Inpatients (after your surgery is over and you are in a regular room).       Your procedure is scheduled on: 12-15-23   Report to [facility] Main Entrance    Report to admitting at        0515 AM   Call this number if you have problems the morning of surgery [phone]   Do not eat food :After Midnight.   After Midnight you may have the following liquids until _0430 _____ AM/ DAY OF SURGERY  then nothing by mouth  Water Non-Citrus Juices (without pulp, NO RED-Apple, White grape, White cranberry) Black Coffee (NO MILK/CREAM OR CREAMERS, sugar ok)  Clear Tea (NO MILK/CREAM OR CREAMERS, sugar ok) regular and decaf                             Plain Jell-O (NO RED)                                           Fruit ices (not with fruit pulp, NO RED)                                     Popsicles (NO RED)                                                               Sports drinks like Gatorade (NO RED)                   The day of surgery:  Drink ONE (1) Pre-Surgery Clear Ensure  BY 0430  AM the morning of surgery. Drink in one sitting. Do not sip.  This drink was given to you during your hospital  pre-op appointment visit. Nothing else to drink after completing the  Pre-Surgery Clear Ensure.          If  you have questions, please contact your surgeon's office.   FOLLOW BOWEL PREP AND ANY ADDITIONAL PRE OP INSTRUCTIONS YOU RECEIVED FROM YOUR SURGEON'S OFFICE!!!     Oral Hygiene is also important to reduce your risk of infection.                                    Remember - BRUSH YOUR TEETH THE MORNING OF  SURGERY WITH YOUR REGULAR TOOTHPASTE  DENTURES WILL BE REMOVED PRIOR TO SURGERY PLEASE DO NOT APPLY "Poly grip" OR ADHESIVES!!!   Do NOT smoke after Midnight   Stop all vitamins and herbal supplements 7 days before surgery.   Take these medicines the morning of surgery with A SIP OF WATER: Tramadol  if needed, sertraline , pantoprazole, levothyroxine , Hydralazine ,Inhaler and bring rescue inhaler with you, diltiazem     Bring CPAP mask and tubing day of surgery.                              You may not have any metal on your body including hair pins, jewelry, and body piercing             Do not wear make-up, lotions, powders, perfumes/cologne, or deodorant  Do not wear nail polish including gel and S&S, artificial/acrylic nails, or any other type of covering on natural nails including finger and toenails. If you have artificial nails, gel coating, etc. that needs to be removed by a nail salon please have this removed prior to surgery or surgery may need to be canceled/ delayed if the surgeon/ anesthesia feels like they are unable to be safely monitored.   Do not shave  48 hours prior to surgery.              Do not bring valuables to the hospital. [facility] IS NOT             RESPONSIBLE   FOR VALUABLES.   Contacts, glasses, dentures or bridgework may not be worn into surgery.   Bring small overnight bag day of surgery.   DO NOT BRING YOUR HOME MEDICATIONS TO THE HOSPITAL. PHARMACY WILL DISPENSE MEDICATIONS LISTED ON YOUR MEDICATION LIST TO YOU DURING YOUR ADMISSION IN THE HOSPITAL!    Patients discharged on the day of surgery will not be allowed to drive home.  Someone NEEDS  to stay with you for the first 24 hours after anesthesia.   Special Instructions: Bring a copy of your healthcare power of attorney and living will documents the day of surgery if you haven't scanned them before.              Please read over the following fact sheets you were given: IF YOU HAVE QUESTIONS ABOUT YOUR PRE-OP INSTRUCTIONS PLEASE CALL [phone]    If you test positive for Covid or have been in contact with anyone that has tested positive in the last 10 days please notify you surgeon.   [place]- Preparing for Total Shoulder Arthroplasty    Before surgery, you can play an important role. Because skin is not sterile, your skin needs to be as free of germs as possible. You can reduce the number of germs on your skin by using the following products. Benzoyl Peroxide Gel Reduces the number of germs present on the skin Applied twice a day to shoulder area starting two days before surgery    ==================================================================  Please follow these instructions carefully:  BENZOYL PEROXIDE 5% GEL  Please do not use if you have an allergy to benzoyl peroxide.   If your skin becomes reddened/irritated stop using the benzoyl peroxide.  Starting two days before surgery, apply as follows: Apply benzoyl peroxide in the morning and at night. Apply after taking a shower. If you are not taking a shower clean entire shoulder front, back, and side along with the armpit with a clean wet washcloth.  Place a quarter-sized dollop on your shoulder and rub in thoroughly, making sure to cover the front, back, and side of your shoulder, along with the armpit.   2 days before ____ AM   ____ PM              1 day before ____ AM   ____ PM                         Do this twice a day for two days.  (Last application is the night before surgery, AFTER using the CHG soap as described below).  Do NOT apply benzoyl peroxide gel on the day of surgery.       Pre-operative 5 CHG Bath Instructions   You can play a key role in reducing the risk of infection after surgery. Your skin needs to be as free of germs as possible. You can reduce the number of germs on your skin by washing with CHG (chlorhexidine  gluconate) soap before surgery. CHG is an antiseptic soap that kills germs and continues to kill germs even after washing.   DO NOT use if you have an allergy to chlorhexidine /CHG or antibacterial soaps. If your skin becomes reddened or irritated, stop using the CHG and notify one of our RNs at [phone].   Please shower with the CHG soap starting 4 days before surgery using the following schedule:     Please keep in mind the following:  DO NOT shave, including legs and underarms, starting the day of your first shower.   You may shave your face at any point before/day of surgery.  Place clean sheets on your bed the day you start using CHG soap. Use a clean washcloth (not used since being washed) for each shower. DO NOT sleep with pets once you start using the CHG.   CHG Shower Instructions:  If you choose to wash your hair and private area, wash first with your normal shampoo/soap.  After you use shampoo/soap, rinse your hair and body thoroughly to remove shampoo/soap residue.  Turn the water OFF and apply about 3 tablespoons (45 ml) of CHG soap to a CLEAN washcloth.  Apply CHG soap ONLY FROM YOUR NECK DOWN TO YOUR TOES (washing for 3-5 minutes)  DO NOT use CHG soap on face, private areas, open wounds, or sores.  Pay special attention to the area where your surgery is being performed.  If you are having back surgery, having someone wash your back for you may be helpful. Wait 2 minutes after CHG soap is applied, then you may rinse off the CHG soap.  Pat dry with a clean towel  Put on clean clothes/pajamas   If you choose to wear lotion, please use ONLY the CHG-compatible lotions on the back of this paper.     Additional instructions for  the day of surgery: DO NOT APPLY any lotions, deodorants, cologne, or perfumes.   Put on clean/comfortable clothes.  Brush your teeth.  Ask your nurse before applying any prescription medications to the skin.      CHG Compatible Lotions   Aveeno Moisturizing lotion  Cetaphil Moisturizing Cream  Cetaphil Moisturizing Lotion  Clairol Herbal Essence Moisturizing Lotion, Dry Skin  Clairol Herbal Essence Moisturizing Lotion, Extra Dry Skin  Clairol Herbal Essence Moisturizing Lotion, Normal Skin  Curel Age Defying Therapeutic Moisturizing Lotion with Alpha Hydroxy  Curel Extreme Care Body Lotion  Curel Soothing Hands Moisturizing Hand Lotion  Curel Therapeutic Moisturizing Cream,  Fragrance-Free  Curel Therapeutic Moisturizing Lotion, Fragrance-Free  Curel Therapeutic Moisturizing Lotion, Original Formula  Eucerin Daily Replenishing Lotion  Eucerin Dry Skin Therapy Plus Alpha Hydroxy Crme  Eucerin Dry Skin Therapy Plus Alpha Hydroxy Lotion  Eucerin Original Crme  Eucerin Original Lotion  Eucerin Plus Crme Eucerin Plus Lotion  Eucerin TriLipid Replenishing Lotion  Keri Anti-Bacterial Hand Lotion  Keri Deep Conditioning Original Lotion Dry Skin Formula Softly Scented  Keri Deep Conditioning Original Lotion, Fragrance Free Sensitive Skin Formula  Keri Lotion Fast Absorbing Fragrance Free Sensitive Skin Formula  Keri Lotion Fast Absorbing Softly Scented Dry Skin Formula  Keri Original Lotion  Keri Skin Renewal Lotion Keri Silky Smooth Lotion  Keri Silky Smooth Sensitive Skin Lotion  Nivea Body Creamy Conditioning Oil  Nivea Body Extra Enriched Lotion  Nivea Body Original Lotion  Nivea Body Sheer Moisturizing Lotion Nivea Crme  Nivea Skin Firming Lotion  NutraDerm 30 Skin Lotion  NutraDerm Skin Lotion  NutraDerm Therapeutic Skin Cream  NutraDerm Therapeutic Skin Lotion  ProShield Protective Hand Cream   Incentive Spirometer  An incentive spirometer is a tool that can  help keep your lungs clear and active. This tool measures how well you are filling your lungs with each breath. Taking long deep breaths may help reverse or decrease the chance of developing breathing (pulmonary) problems (especially infection) following: A long period of time when you are unable to move or be active. BEFORE THE PROCEDURE  If the spirometer includes an indicator to show your best effort, your nurse or respiratory therapist will set it to a desired goal. If possible, sit up straight or lean slightly forward. Try not to slouch. Hold the incentive spirometer in an upright position. INSTRUCTIONS FOR USE  Sit on the edge of your bed if possible, or sit up as far as you can in bed or on a chair. Hold the incentive spirometer in an upright position. Breathe out normally. Place the mouthpiece in your mouth and seal your lips tightly around it. Breathe in slowly and as deeply as possible, raising the piston or the ball toward the top of the column. Hold your breath for 3-5 seconds or for as long as possible. Allow the piston or ball to fall to the bottom of the column. Remove the mouthpiece from your mouth and breathe out normally. Rest for a few seconds and repeat Steps 1 through 7 at least 10 times every 1-2 hours when you are awake. Take your time and take a few normal breaths between deep breaths. The spirometer may include an indicator to show your best effort. Use the indicator as a goal to work toward during each repetition. After each set of 10 deep breaths, practice coughing to be sure your lungs are clear. If you have an incision (the cut made at the time of surgery), support your incision when coughing by placing a pillow or rolled up towels firmly against it. Once you are able to get out of bed, walk around indoors and cough well. You may stop using the incentive spirometer when instructed by your caregiver.  RISKS AND COMPLICATIONS Take your time so you do not get dizzy or  light-headed. If you are in pain, you may need to take or ask for pain medication before doing incentive spirometry. It is harder to take a deep breath if you are having pain. AFTER USE Rest and breathe slowly and easily. It can be helpful to keep track of a log of your progress. Your caregiver can  provide you with a simple table to help with this. If you are using the spirometer at home, follow these instructions: SEEK MEDICAL CARE IF:  You are having difficultly using the spirometer. You have trouble using the spirometer as often as instructed. Your pain medication is not giving enough relief while using the spirometer. You develop fever of 100.5 F (38.1 C) or higher. SEEK IMMEDIATE MEDICAL CARE IF:  You cough up bloody sputum that had not been present before. You develop fever of 102 F (38.9 C) or greater. You develop worsening pain at or near the incision site. MAKE SURE YOU:  Understand these instructions. Will watch your condition. Will get help right away if you are not doing well or get worse. Document Released: 11/01/2006 Document Revised: 09/13/2011 Document Reviewed: 01/02/2007 [facility] Patient Information 2014 [place], [place].   ________________________________________________________________________

## 2023-12-07 NOTE — Progress Notes (Addendum)
 PCP - Kimber Pencil, PA-C Cardiologist - Knox Perl, MD/Tele visit 11-16-23 Palmer Bobo, NP epic ENDO-Clearance media 12-09-23  PPM/ICD -  Device Orders -  Rep Notified -   Chest x-ray - 04-12-23 epic EKG - 07-20-23 epic/ EKG 11-23-23 in media Stress Test -  ECHO - 08-12-23 epic Cardiac Cath -  LABS- CBC/diff, CMP 11-23-23 CE  Sleep Study -  CPAP - yes  Fasting Blood Sugar -  Checks Blood Sugar __n/a  Blood Thinner Instructions:n/a Aspirin Instructions:n/a  ERAS Protcol - PRE-SURGERY ensure    COVID vaccine -yes  Activity-- Anesthesia review: OSA cpap, HTN, L breast CA, CKD3, Asthma, murmur, CHF  Patient denies shortness of breath, fever, cough and chest pain at PAT appointment   All instructions explained to the patient, with a verbal understanding of the material. Patient agrees to go over the instructions while at home for a better understanding. Patient also instructed to self quarantine after being tested for COVID-19. The opportunity to ask questions was provided.

## 2023-12-08 ENCOUNTER — Encounter (HOSPITAL_COMMUNITY): Payer: Self-pay

## 2023-12-08 ENCOUNTER — Other Ambulatory Visit: Payer: Self-pay

## 2023-12-08 ENCOUNTER — Encounter (HOSPITAL_COMMUNITY)
Admission: RE | Admit: 2023-12-08 | Discharge: 2023-12-08 | Disposition: A | Discharge status: Home / Self Care | Source: Ambulatory Visit | Attending: Orthopedic Surgery | Admitting: Orthopedic Surgery

## 2023-12-08 ENCOUNTER — Encounter

## 2023-12-08 VITALS — BP 174/82 | HR 66 | Temp 98.6°F | Resp 16 | Ht 59.5 in | Wt 232.0 lb

## 2023-12-08 DIAGNOSIS — I509 Heart failure, unspecified: Secondary | ICD-10-CM | POA: Insufficient documentation

## 2023-12-08 DIAGNOSIS — J45909 Unspecified asthma, uncomplicated: Secondary | ICD-10-CM | POA: Diagnosis not present

## 2023-12-08 DIAGNOSIS — Z01812 Encounter for preprocedural laboratory examination: Secondary | ICD-10-CM | POA: Insufficient documentation

## 2023-12-08 DIAGNOSIS — I110 Hypertensive heart disease with heart failure: Secondary | ICD-10-CM | POA: Insufficient documentation

## 2023-12-08 DIAGNOSIS — Z01818 Encounter for other preprocedural examination: Secondary | ICD-10-CM

## 2023-12-08 DIAGNOSIS — M19011 Primary osteoarthritis, right shoulder: Secondary | ICD-10-CM | POA: Insufficient documentation

## 2023-12-08 DIAGNOSIS — I10 Essential (primary) hypertension: Secondary | ICD-10-CM

## 2023-12-08 DIAGNOSIS — M75101 Unspecified rotator cuff tear or rupture of right shoulder, not specified as traumatic: Secondary | ICD-10-CM | POA: Insufficient documentation

## 2023-12-08 DIAGNOSIS — G4730 Sleep apnea, unspecified: Secondary | ICD-10-CM | POA: Diagnosis not present

## 2023-12-08 HISTORY — DX: Cardiac murmur, unspecified: R01.1

## 2023-12-08 HISTORY — DX: Heart failure, unspecified: I50.9

## 2023-12-08 LAB — SURGICAL PCR SCREEN
MRSA, PCR: NEGATIVE
Staphylococcus aureus: NEGATIVE

## 2023-12-09 NOTE — Anesthesia Preprocedure Evaluation (Addendum)
 Anesthesia Evaluation  Patient identified by MRN, date of birth, ID band Patient awake    Reviewed: Allergy & Precautions, NPO status , Patient's Chart, lab work & pertinent test results  History of Anesthesia Complications (+) PONV and history of anesthetic complications  Airway Mallampati: III  TM Distance: >3 FB Neck ROM: Full    Dental  (+) Teeth Intact, Dental Advisory Given   Pulmonary asthma , sleep apnea and Continuous Positive Airway Pressure Ventilation  Last albuterol  use a few weeks ago, used advair  this AM   Pulmonary exam normal breath sounds clear to auscultation       Cardiovascular hypertension (163/64 preop, per pt normally 130s), Pt. on medications + DOE  Normal cardiovascular exam Rhythm:Regular Rate:Normal  08/2023  1. Left ventricular ejection fraction, by estimation, is 65 to 70%. The  left ventricle has normal function. The left ventricle has no regional  wall motion abnormalities. There is mild asymmetric left ventricular  hypertrophy of the basal-septal segment. Left ventricular diastolic parameters are consistent with Grade I diastolic dysfunction (impaired relaxation).   2. Right ventricular systolic function is normal. The right ventricular  size is normal.   3. Left atrial size was mild to moderately dilated.   4. The mitral valve is normal in structure. Trivial mitral valve  regurgitation. No evidence of mitral stenosis.   5. The aortic valve is tricuspid. There is mild calcification of the  aortic valve. Aortic valve regurgitation is not visualized. Aortic valve  sclerosis/calcification is present, without any evidence of aortic  stenosis.   6. The inferior vena cava is normal in size with greater than 50%  respiratory variability, suggesting right atrial pressure of 3 mmHg.     Neuro/Psych  PSYCHIATRIC DISORDERS Anxiety     negative neurological ROS     GI/Hepatic negative GI ROS, Neg liver  ROS,,,  Endo/Other  Hypothyroidism  Class 3 obesityBMI 46  Renal/GU negative Renal ROS  negative genitourinary   Musculoskeletal  (+) Arthritis , Osteoarthritis,    Abdominal  (+) + obese  Peds  Hematology negative hematology ROS (+)   Anesthesia Other Findings   Reproductive/Obstetrics negative OB ROS                              Anesthesia Physical Anesthesia Plan  ASA: 3  Anesthesia Plan: General and Regional   Post-op Pain Management: Regional block* and Tylenol  PO (pre-op)*   Induction: Intravenous  PONV Risk Score and Plan: 4 or greater and Ondansetron , Dexamethasone  and Treatment may vary due to age or medical condition  Airway Management Planned: Oral ETT  Additional Equipment: None  Intra-op Plan:   Post-operative Plan: Extubation in OR  Informed Consent: I have reviewed the patients History and Physical, chart, labs and discussed the procedure including the risks, benefits and alternatives for the proposed anesthesia with the patient or authorized representative who has indicated his/her understanding and acceptance.     Dental advisory given  Plan Discussed with: CRNA  Anesthesia Plan Comments:         Anesthesia Quick Evaluation

## 2023-12-09 NOTE — Progress Notes (Signed)
 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 never smoker with h/o PONV, HTN, sleep apnea with CPAP, asthma, CHF, right shoulder OA scheduled for above procedure 12/15/23 with Dr. Sammye Cristal.   Per cardiology preoperative evaluation 11/16/2023, "According to the Revised Cardiac Risk Index (RCRI), her Perioperative Risk of Major Cardiac Event is (%): 0.9. Her Functional Capacity in METs is: 5.07 according to the Duke Activity Status Index (DASI). Therefore, based on ACC/AHA guidelines, patient would be at acceptable risk for the planned procedure without further cardiovascular testing.   The patient was advised that if she develops new symptoms prior to surgery to contact our office to arrange for a follow-up visit, and she verbalized understanding."  Per pulmonology note 11/18/2023 asthma well managed on Advair , OSA well controlled with consistent CPAP use.  VS: BP (!) 174/82   Pulse 66   Temp 37 C (Oral)   Resp 16   Ht 4' 11.5" (1.511 m)   Wt 105.2 kg   SpO2 97%   BMI 46.07 kg/m   PROVIDERS: Jearlean Mince, PA-C  is PCP   Primary Cardiologist:  Knox Perl, MD  LABS: Labs reviewed: Acceptable for surgery. (all labs ordered are listed, but only abnormal results are displayed)  Labs Reviewed  SURGICAL PCR SCREEN     IMAGES:   EKG:   CV: Echo 08/12/2023 1. Left ventricular ejection fraction, by estimation, is 65 to 70%. The  left ventricle has normal function. The left ventricle has no regional  wall motion abnormalities. There is mild  asymmetric left ventricular  hypertrophy of the basal-septal segment.  Left ventricular diastolic parameters are consistent with Grade I  diastolic dysfunction (impaired relaxation).   2. Right ventricular systolic function is normal. The right ventricular  size is normal.   3. Left atrial size was mild to moderately dilated.   4. The mitral valve is normal in structure. Trivial mitral valve  regurgitation. No evidence of mitral stenosis.   5. The aortic valve is tricuspid. There is mild calcification of the  aortic valve. Aortic valve regurgitation is not visualized. Aortic valve  sclerosis/calcification is present, without any evidence of aortic  stenosis.   6. The inferior vena cava is normal in size with greater than 50%  respiratory variability, suggesting right atrial pressure of 3 mmHg.   Lexiscan  Myoview  stress test 01/17/2019: Lexiscan  stress test was performed. Stress EKG is non-diagnostic, as this is pharmacological stress test. Pharmacological myocardial perfusion imaging is normal. LVEF 69%. Low risk study.  Past Medical History:  Diagnosis Date   Anemia    PMH;  Patient denies this dx as of 06/03/21   Anxiety    Asthma    Breast cancer (HCC) 1988   left breast cancer   CHF (congestive heart failure) (HCC)    Hx of Acute  HF   Collagenous colitis    Compression fracture    lumbar 1   Dyspnea    with exertion   Heart murmur    Hypertension    Hypothyroidism  Osteoarthritis    Osteopenia    Pneumonia 07/2020   PONV (postoperative nausea and vomiting)    Sleep apnea    wears CPAP nightly    Past Surgical History:  Procedure Laterality Date   BREAST REDUCTION SURGERY     Right   CATARACT EXTRACTION W/ INTRAOCULAR LENS  IMPLANT, BILATERAL     CESAREAN SECTION     COLONOSCOPY W/ BIOPSIES AND POLYPECTOMY     CYST EXCISION N/A 08/30/2018   Procedure: excision of posterior shoulder / back sebaceous cyst;  Surgeon: Thornell Flirt, DO;  Location: [facility];  Service: Plastics;  Laterality: N/A;   KNEE ARTHROSCOPY Left 02/23/2022   Procedure: LEFT KNEE ARTHROSCOPY;  Surgeon: Dayne Even, MD;  Location: [facility];  Service: Orthopedics;  Laterality: Left;   KYPHOPLASTY N/A 02/18/2016   Procedure: LUMBAR 1 KYPHOPLASTY;  Surgeon: Virl Grimes, MD;  Location: [facility];  Service: Orthopedics;  Laterality: N/A;  LUMBAR 1 KYPHOPLASTY   KYPHOPLASTY N/A 06/04/2021   Procedure: LUMBAR 2 KYPHOPLASTY;  Surgeon: Virl Grimes, MD;  Location: [facility];  Service: Orthopedics;  Laterality: N/A;   KYPHOPLASTY N/A 03/31/2023   Procedure: LUMBAR 4 KYPHOPLASTY;  Surgeon: Virl Grimes, MD;  [facility];  Service: Orthopedics;  Laterality: N/A;   LAPAROSCOPIC APPENDECTOMY N/A 12/09/2021   Procedure: APPENDECTOMY LAPAROSCOPIC;  Surgeon: Melvenia Stabs, MD;  Location: [facility];  Service: General;  Laterality: N/A;   LUMBAR DISC SURGERY     MODIFIED RADICAL MASTECTOMY W/ AXILLARY LYMPH NODE DISSECTION     Left   RECONSTRUCTION BREAST W/ LATISSIMUS DORSI FLAP     left   REDUCTION MAMMAPLASTY Right    right foot surgery     corrected hammer toe, straightened big toe   SEPTOPLASTY     TOE SURGERY     TONSILLECTOMY AND ADENOIDECTOMY      MEDICATIONS:  acetaminophen  (TYLENOL ) 650 MG CR tablet   albuterol  (PROVENTIL ) (2.5 MG/3ML) 0.083% nebulizer solution   albuterol  (VENTOLIN  HFA) 108 (90 Base) MCG/ACT inhaler   B Complex-C (B-COMPLEX WITH VITAMIN C) tablet   betamethasone  dipropionate (DIPROLENE ) 0.05 % ointment   Calcium  Carb-Cholecalciferol  (CALTRATE 600+D3 PO)   Cholecalciferol  (VITAMIN D3 PO)   clobetasol ointment (TEMOVATE) 0.05 %   denosumab  (PROLIA ) 60 MG/ML SOSY injection   diclofenac sodium (VOLTAREN) 1 % GEL   diltiazem  (CARDIZEM  CD) 240 MG 24 hr capsule   diphenhydrAMINE  HCl, Sleep, (ZZZQUIL PO)   famotidine (PEPCID) 40 MG tablet   fluticasone -salmeterol (ADVAIR ) 100-50 MCG/ACT AEPB   furosemide  (LASIX ) 40 MG tablet    hydrALAZINE  (APRESOLINE ) 50 MG tablet   levocetirizine (XYZAL) 5 MG tablet   levothyroxine  (SYNTHROID , LEVOTHROID) 88 MCG tablet   losartan  (COZAAR ) 100 MG tablet   Menthol, Topical Analgesic, (ASPERCREME MAX ROLL-ON) 16 % LIQD   Multiple Vitamins-Minerals (MULTIVITAMIN WITH MINERALS) tablet   NON FORMULARY   ORTHOVISC 30 MG/2ML SOSY   pantoprazole (PROTONIX) 40 MG tablet   Polyethyl Glycol-Propyl Glycol (SYSTANE) 0.4-0.3 % SOLN   potassium chloride  SA (KLOR-CON  M20) 20 MEQ tablet   sertraline  (ZOLOFT ) 100 MG tablet   traMADol  (ULTRAM ) 50 MG tablet    methylPREDNISolone  acetate (DEPO-MEDROL ) injection 80 mg    Reed Dady Ward, PA-C [facility]-Surgical Testing [phone]

## 2023-12-13 ENCOUNTER — Encounter

## 2023-12-13 NOTE — Telephone Encounter (Signed)
 Copy of this encounter was faxed to [facility].

## 2023-12-13 NOTE — Telephone Encounter (Signed)
 Copied from CRM [id number]. Topic: Clinical - Medical Advice >> Dec 12, 2023  4:42 PM Corean Deutscher wrote: Reason for CRM: Maureen Sour with [facility] called regarding Surgical clearence for this patient, she stated she has faxed and called for several weeks and the fax does not go through, patient is having surgery and needs a surgical clearence, the patient is scheduled to have surgery this week Thursday on June 12th and is needing surgical clearence as soon as possible. fax number is [phone].  [name], please see below and advise on risk assessment. Thanks

## 2023-12-13 NOTE — Telephone Encounter (Signed)
 She is cleared from pulmonary standpoint, she will be considered low-intermediate risk due to agem asthma and OSA. She is compliance with CPAP and has had not recent respiratory infections.

## 2023-12-14 ENCOUNTER — Encounter

## 2023-12-14 ENCOUNTER — Ambulatory Visit
Admission: RE | Admit: 2023-12-14 | Discharge: 2023-12-14 | Disposition: A | Discharge status: Home / Self Care | Source: Ambulatory Visit | Attending: Physician Assistant | Admitting: Physician Assistant

## 2023-12-14 DIAGNOSIS — N649 Disorder of breast, unspecified: Secondary | ICD-10-CM

## 2023-12-15 ENCOUNTER — Encounter (HOSPITAL_COMMUNITY): Payer: Self-pay | Admitting: Orthopedic Surgery

## 2023-12-15 ENCOUNTER — Encounter (HOSPITAL_COMMUNITY)
Admission: RE | Disposition: A | Discharge status: Home / Self Care | Payer: Self-pay | Source: Home / Self Care | Attending: Orthopedic Surgery

## 2023-12-15 ENCOUNTER — Ambulatory Visit (HOSPITAL_COMMUNITY): Admitting: Anesthesiology

## 2023-12-15 ENCOUNTER — Other Ambulatory Visit: Payer: Self-pay

## 2023-12-15 ENCOUNTER — Observation Stay (HOSPITAL_COMMUNITY)
Admission: RE | Admit: 2023-12-15 | Discharge: 2023-12-16 | Disposition: A | Discharge status: Home / Self Care | Attending: Orthopedic Surgery | Admitting: Orthopedic Surgery

## 2023-12-15 ENCOUNTER — Ambulatory Visit (HOSPITAL_COMMUNITY): Payer: Self-pay | Admitting: Physician Assistant

## 2023-12-15 ENCOUNTER — Telehealth: Payer: Self-pay | Admitting: *Deleted

## 2023-12-15 DIAGNOSIS — Z79899 Other long term (current) drug therapy: Secondary | ICD-10-CM | POA: Diagnosis not present

## 2023-12-15 DIAGNOSIS — M19011 Primary osteoarthritis, right shoulder: Secondary | ICD-10-CM

## 2023-12-15 DIAGNOSIS — E039 Hypothyroidism, unspecified: Secondary | ICD-10-CM | POA: Diagnosis not present

## 2023-12-15 DIAGNOSIS — J45909 Unspecified asthma, uncomplicated: Secondary | ICD-10-CM | POA: Insufficient documentation

## 2023-12-15 DIAGNOSIS — I509 Heart failure, unspecified: Secondary | ICD-10-CM

## 2023-12-15 DIAGNOSIS — M75101 Unspecified rotator cuff tear or rupture of right shoulder, not specified as traumatic: Principal | ICD-10-CM | POA: Insufficient documentation

## 2023-12-15 DIAGNOSIS — Z853 Personal history of malignant neoplasm of breast: Secondary | ICD-10-CM | POA: Diagnosis not present

## 2023-12-15 DIAGNOSIS — I110 Hypertensive heart disease with heart failure: Secondary | ICD-10-CM

## 2023-12-15 DIAGNOSIS — Z96611 Presence of right artificial shoulder joint: Principal | ICD-10-CM

## 2023-12-15 HISTORY — PX: REVERSE SHOULDER ARTHROPLASTY: SHX5054

## 2023-12-15 SURGERY — ARTHROPLASTY, SHOULDER, TOTAL, REVERSE
Anesthesia: Regional | Site: Shoulder | Laterality: Right

## 2023-12-15 MED ORDER — ROCURONIUM BROMIDE 10 MG/ML (PF) SYRINGE
PREFILLED_SYRINGE | INTRAVENOUS | Status: AC
  Filled 2023-12-15: qty 10

## 2023-12-15 MED ORDER — LIDOCAINE HCL (CARDIAC) PF 100 MG/5ML IV SOSY
PREFILLED_SYRINGE | INTRAVENOUS | Status: DC | PRN
  Administered 2023-12-15: 40 mg via INTRAVENOUS

## 2023-12-15 MED ORDER — LIDOCAINE HCL (PF) 2 % IJ SOLN
INTRAMUSCULAR | Status: AC
  Filled 2023-12-15: qty 5

## 2023-12-15 MED ORDER — ACETAMINOPHEN 500 MG PO TABS
1000.0000 mg | ORAL_TABLET | Freq: Once | ORAL | Status: DC
  Filled 2023-12-15: qty 2

## 2023-12-15 MED ORDER — POLYVINYL ALCOHOL 1.4 % OP SOLN
1.0000 [drp] | Freq: Every day | OPHTHALMIC | Status: DC
  Administered 2023-12-15: 1 [drp] via OPHTHALMIC
  Filled 2023-12-15: qty 15

## 2023-12-15 MED ORDER — POTASSIUM CHLORIDE CRYS ER 20 MEQ PO TBCR
20.0000 meq | EXTENDED_RELEASE_TABLET | Freq: Three times a day (TID) | ORAL | Status: DC
Start: 2023-12-15 — End: 2023-12-16
  Administered 2023-12-15 – 2023-12-16 (×3): 20 meq via ORAL
  Filled 2023-12-15 (×3): qty 1

## 2023-12-15 MED ORDER — FLUTICASONE FUROATE-VILANTEROL 100-25 MCG/ACT IN AEPB
1.0000 | INHALATION_SPRAY | Freq: Every day | RESPIRATORY_TRACT | Status: DC
  Administered 2023-12-16: 1 via RESPIRATORY_TRACT
  Filled 2023-12-15: qty 28

## 2023-12-15 MED ORDER — AMISULPRIDE (ANTIEMETIC) 5 MG/2ML IV SOLN
10.0000 mg | Freq: Once | INTRAVENOUS | Status: AC
  Administered 2023-12-15: 10 mg via INTRAVENOUS

## 2023-12-15 MED ORDER — DOCUSATE SODIUM 100 MG PO CAPS
100.0000 mg | ORAL_CAPSULE | Freq: Two times a day (BID) | ORAL | Status: DC
  Administered 2023-12-15 (×2): 100 mg via ORAL
  Filled 2023-12-15 (×2): qty 1

## 2023-12-15 MED ORDER — ONDANSETRON HCL 4 MG/2ML IJ SOLN
INTRAMUSCULAR | Status: DC | PRN
Start: 2023-12-15 — End: 2023-12-15
  Administered 2023-12-15: 4 mg via INTRAVENOUS

## 2023-12-15 MED ORDER — ONDANSETRON HCL 4 MG/2ML IJ SOLN
4.0000 mg | Freq: Once | INTRAMUSCULAR | Status: AC | PRN
  Administered 2023-12-15: 4 mg via INTRAVENOUS

## 2023-12-15 MED ORDER — PHENOL 1.4 % MT LIQD: 1.0000 | OROMUCOSAL | Status: DC | PRN

## 2023-12-15 MED ORDER — SODIUM CHLORIDE 0.9 % IR SOLN
Status: DC | PRN
  Administered 2023-12-15: 1000 mL

## 2023-12-15 MED ORDER — SODIUM CHLORIDE 0.9 % IV SOLN: INTRAVENOUS | Status: DC

## 2023-12-15 MED ORDER — PHENYLEPHRINE HCL-NACL 20-0.9 MG/250ML-% IV SOLN
INTRAVENOUS | Status: AC
  Filled 2023-12-15: qty 250

## 2023-12-15 MED ORDER — ONDANSETRON HCL 4 MG/2ML IJ SOLN
INTRAMUSCULAR | Status: AC
  Filled 2023-12-15: qty 2

## 2023-12-15 MED ORDER — FAMOTIDINE 20 MG PO TABS
40.0000 mg | ORAL_TABLET | Freq: Every day | ORAL | Status: DC
Start: 2023-12-15 — End: 2023-12-16
  Administered 2023-12-15: 40 mg via ORAL
  Filled 2023-12-15: qty 2

## 2023-12-15 MED ORDER — LEVOTHYROXINE SODIUM 88 MCG PO TABS
88.0000 ug | ORAL_TABLET | Freq: Every day | ORAL | Status: DC
Start: 2023-12-16 — End: 2023-12-16
  Administered 2023-12-16: 88 ug via ORAL
  Filled 2023-12-15: qty 1

## 2023-12-15 MED ORDER — EPHEDRINE SULFATE-NACL 50-0.9 MG/10ML-% IV SOSY
PREFILLED_SYRINGE | INTRAVENOUS | Status: DC | PRN
  Administered 2023-12-15 (×3): 5 mg via INTRAVENOUS

## 2023-12-15 MED ORDER — WATER FOR IRRIGATION, STERILE IR SOLN
Status: DC | PRN
Start: 2023-12-15 — End: 2023-12-15
  Administered 2023-12-15: 1000 mL

## 2023-12-15 MED ORDER — DEXAMETHASONE SODIUM PHOSPHATE 10 MG/ML IJ SOLN
INTRAMUSCULAR | Status: AC
  Filled 2023-12-15: qty 1

## 2023-12-15 MED ORDER — FLEET ENEMA RE ENEM: 1.0000 | ENEMA | Freq: Once | RECTAL | Status: DC | PRN

## 2023-12-15 MED ORDER — CEFAZOLIN SODIUM-DEXTROSE 2-4 GM/100ML-% IV SOLN
2.0000 g | INTRAVENOUS | Status: AC
  Administered 2023-12-15: 2 g via INTRAVENOUS
  Filled 2023-12-15: qty 100

## 2023-12-15 MED ORDER — FENTANYL CITRATE (PF) 100 MCG/2ML IJ SOLN
INTRAMUSCULAR | Status: DC | PRN
  Administered 2023-12-15: 25 ug via INTRAVENOUS
  Administered 2023-12-15: 50 ug via INTRAVENOUS
  Administered 2023-12-15: 25 ug via INTRAVENOUS

## 2023-12-15 MED ORDER — HYDRALAZINE HCL 50 MG PO TABS
50.0000 mg | ORAL_TABLET | Freq: Three times a day (TID) | ORAL | Status: DC
Start: 2023-12-15 — End: 2023-12-16
  Administered 2023-12-15 – 2023-12-16 (×3): 50 mg via ORAL
  Filled 2023-12-15 (×3): qty 1

## 2023-12-15 MED ORDER — BISACODYL 5 MG PO TBEC: 5.0000 mg | DELAYED_RELEASE_TABLET | Freq: Every day | ORAL | Status: DC | PRN

## 2023-12-15 MED ORDER — TRANEXAMIC ACID-NACL 1000-0.7 MG/100ML-% IV SOLN
1000.0000 mg | INTRAVENOUS | Status: AC
  Administered 2023-12-15: 1000 mg via INTRAVENOUS
  Filled 2023-12-15: qty 100

## 2023-12-15 MED ORDER — LOSARTAN POTASSIUM 50 MG PO TABS
100.0000 mg | ORAL_TABLET | Freq: Every morning | ORAL | Status: DC
Start: 2023-12-16 — End: 2023-12-16
  Administered 2023-12-16: 100 mg via ORAL
  Filled 2023-12-15: qty 2

## 2023-12-15 MED ORDER — OXYCODONE HCL 5 MG PO TABS
5.0000 mg | ORAL_TABLET | ORAL | Status: DC | PRN
  Administered 2023-12-15 – 2023-12-16 (×2): 5 mg via ORAL
  Filled 2023-12-15 (×3): qty 1

## 2023-12-15 MED ORDER — ORAL CARE MOUTH RINSE
15.0000 mL | Freq: Once | OROMUCOSAL | Status: AC
Start: 2023-12-15 — End: 2023-12-15

## 2023-12-15 MED ORDER — OXYCODONE HCL 5 MG PO TABS: 10.0000 mg | ORAL_TABLET | ORAL | Status: DC | PRN

## 2023-12-15 MED ORDER — FENTANYL CITRATE PF 50 MCG/ML IJ SOSY: 25.0000 ug | PREFILLED_SYRINGE | INTRAMUSCULAR | Status: DC | PRN

## 2023-12-15 MED ORDER — 0.9 % SODIUM CHLORIDE (POUR BTL) OPTIME
TOPICAL | Status: DC | PRN
Start: 2023-12-15 — End: 2023-12-15
  Administered 2023-12-15: 1000 mL

## 2023-12-15 MED ORDER — ACETAMINOPHEN 500 MG PO TABS
1000.0000 mg | ORAL_TABLET | Freq: Four times a day (QID) | ORAL | Status: AC
  Administered 2023-12-15 (×2): 1000 mg via ORAL
  Filled 2023-12-15 (×3): qty 2

## 2023-12-15 MED ORDER — DEXAMETHASONE SODIUM PHOSPHATE 10 MG/ML IJ SOLN
INTRAMUSCULAR | Status: DC | PRN
  Administered 2023-12-15: 5 mg via INTRAVENOUS

## 2023-12-15 MED ORDER — METHOCARBAMOL 1000 MG/10ML IJ SOLN: 500.0000 mg | Freq: Four times a day (QID) | INTRAMUSCULAR | Status: DC | PRN

## 2023-12-15 MED ORDER — LORATADINE 10 MG PO TABS
10.0000 mg | ORAL_TABLET | Freq: Every evening | ORAL | Status: DC
Start: 2023-12-15 — End: 2023-12-16
  Administered 2023-12-15: 10 mg via ORAL
  Filled 2023-12-15 (×2): qty 1

## 2023-12-15 MED ORDER — ONDANSETRON HCL 4 MG PO TABS
4.0000 mg | ORAL_TABLET | Freq: Four times a day (QID) | ORAL | Status: DC | PRN
  Administered 2023-12-16: 4 mg via ORAL
  Filled 2023-12-15: qty 1

## 2023-12-15 MED ORDER — DIPHENHYDRAMINE HCL 12.5 MG/5ML PO ELIX
12.5000 mg | ORAL_SOLUTION | ORAL | Status: DC | PRN
  Administered 2023-12-16: 25 mg via ORAL
  Filled 2023-12-15: qty 10

## 2023-12-15 MED ORDER — LACTATED RINGERS IV SOLN: INTRAVENOUS | Status: DC

## 2023-12-15 MED ORDER — ACETAMINOPHEN 325 MG PO TABS: 325.0000 mg | ORAL_TABLET | Freq: Four times a day (QID) | ORAL | Status: DC | PRN

## 2023-12-15 MED ORDER — MENTHOL 3 MG MT LOZG: 1.0000 | LOZENGE | OROMUCOSAL | Status: DC | PRN

## 2023-12-15 MED ORDER — POLYETHYLENE GLYCOL 3350 17 G PO PACK: 17.0000 g | PACK | Freq: Every day | ORAL | Status: DC | PRN

## 2023-12-15 MED ORDER — PROPOFOL 10 MG/ML IV BOLUS
INTRAVENOUS | Status: AC
  Filled 2023-12-15: qty 20

## 2023-12-15 MED ORDER — HYDROMORPHONE HCL 1 MG/ML IJ SOLN: 0.5000 mg | INTRAMUSCULAR | Status: DC | PRN

## 2023-12-15 MED ORDER — ALBUTEROL SULFATE (2.5 MG/3ML) 0.083% IN NEBU
3.0000 mL | INHALATION_SOLUTION | Freq: Four times a day (QID) | RESPIRATORY_TRACT | Status: DC | PRN
  Administered 2023-12-15: 3 mL via RESPIRATORY_TRACT
  Filled 2023-12-15: qty 3

## 2023-12-15 MED ORDER — TIZANIDINE HCL 4 MG PO TABS: 2.0000 mg | ORAL_TABLET | Freq: Three times a day (TID) | ORAL | 0 refills | Status: DC | PRN

## 2023-12-15 MED ORDER — DILTIAZEM HCL ER COATED BEADS 240 MG PO CP24
240.0000 mg | ORAL_CAPSULE | Freq: Every day | ORAL | Status: DC
Start: 2023-12-16 — End: 2023-12-16
  Administered 2023-12-16: 240 mg via ORAL
  Filled 2023-12-15: qty 1

## 2023-12-15 MED ORDER — MIDAZOLAM HCL 2 MG/2ML IJ SOLN
INTRAMUSCULAR | Status: AC
  Filled 2023-12-15: qty 2

## 2023-12-15 MED ORDER — AMISULPRIDE (ANTIEMETIC) 5 MG/2ML IV SOLN
INTRAVENOUS | Status: AC
  Filled 2023-12-15: qty 4

## 2023-12-15 MED ORDER — OXYCODONE HCL 5 MG PO TABS: 5.0000 mg | ORAL_TABLET | ORAL | 0 refills | Status: DC | PRN

## 2023-12-15 MED ORDER — ROCURONIUM BROMIDE 100 MG/10ML IV SOLN
INTRAVENOUS | Status: DC | PRN
  Administered 2023-12-15: 70 mg via INTRAVENOUS

## 2023-12-15 MED ORDER — FUROSEMIDE 40 MG PO TABS
40.0000 mg | ORAL_TABLET | Freq: Every day | ORAL | Status: DC
  Administered 2023-12-16: 40 mg via ORAL
  Filled 2023-12-15: qty 1

## 2023-12-15 MED ORDER — SERTRALINE HCL 100 MG PO TABS
100.0000 mg | ORAL_TABLET | Freq: Two times a day (BID) | ORAL | Status: DC
  Administered 2023-12-15 – 2023-12-16 (×2): 100 mg via ORAL
  Filled 2023-12-15 (×2): qty 1

## 2023-12-15 MED ORDER — PROPOFOL 10 MG/ML IV BOLUS
INTRAVENOUS | Status: DC | PRN
Start: 2023-12-15 — End: 2023-12-15
  Administered 2023-12-15: 150 mg via INTRAVENOUS
  Administered 2023-12-15: 20 mg via INTRAVENOUS

## 2023-12-15 MED ORDER — MIDAZOLAM HCL 5 MG/5ML IJ SOLN
INTRAMUSCULAR | Status: DC | PRN
Start: 2023-12-15 — End: 2023-12-15
  Administered 2023-12-15: 1 mg via INTRAVENOUS

## 2023-12-15 MED ORDER — SUGAMMADEX SODIUM 200 MG/2ML IV SOLN
INTRAVENOUS | Status: DC | PRN
  Administered 2023-12-15: 200 mg via INTRAVENOUS

## 2023-12-15 MED ORDER — BUPIVACAINE LIPOSOME 1.3 % IJ SUSP
INTRAMUSCULAR | Status: DC | PRN
  Administered 2023-12-15: 10 mL via PERINEURAL

## 2023-12-15 MED ORDER — METHOCARBAMOL 500 MG PO TABS
500.0000 mg | ORAL_TABLET | Freq: Four times a day (QID) | ORAL | Status: DC | PRN
  Filled 2023-12-15: qty 1

## 2023-12-15 MED ORDER — FENTANYL CITRATE (PF) 100 MCG/2ML IJ SOLN
INTRAMUSCULAR | Status: AC
Start: 2023-12-15 — End: 2023-12-15
  Filled 2023-12-15: qty 2

## 2023-12-15 MED ORDER — ASPIRIN 81 MG PO TBEC
81.0000 mg | DELAYED_RELEASE_TABLET | Freq: Two times a day (BID) | ORAL | Status: DC
  Administered 2023-12-16: 81 mg via ORAL
  Filled 2023-12-15: qty 1

## 2023-12-15 MED ORDER — CHLORHEXIDINE GLUCONATE 0.12 % MT SOLN
15.0000 mL | Freq: Once | OROMUCOSAL | Status: AC
Start: 2023-12-15 — End: 2023-12-15
  Administered 2023-12-15: 15 mL via OROMUCOSAL

## 2023-12-15 MED ORDER — BUPIVACAINE HCL (PF) 0.5 % IJ SOLN
INTRAMUSCULAR | Status: DC | PRN
  Administered 2023-12-15: 15 mL via PERINEURAL

## 2023-12-15 SURGICAL SUPPLY — 65 items
BAG COUNTER SPONGE SURGICOUNT (BAG) IMPLANT
BAG ZIPLOCK 12X15 (MISCELLANEOUS) ×1 IMPLANT
BASEPLATE P2 COATD GLND 6.5X30 (Shoulder) IMPLANT
BIT DRILL 2.5 DIA 127 CALI (BIT) IMPLANT
BIT DRILL 4 DIA CALIBRATED (BIT) IMPLANT
BLADE SAW SGTL 73X25 THK (BLADE) ×1 IMPLANT
BOOTIES KNEE HIGH SLOAN (MISCELLANEOUS) ×2 IMPLANT
COOLER ICEMAN CLASSIC (MISCELLANEOUS) ×1 IMPLANT
COVER BACK TABLE 60X90IN (DRAPES) ×1 IMPLANT
COVER SURGICAL LIGHT HANDLE (MISCELLANEOUS) ×1 IMPLANT
DRAPE INCISE IOBAN 66X45 STRL (DRAPES) ×1 IMPLANT
DRAPE POUCH INSTRU U-SHP 10X18 (DRAPES) ×1 IMPLANT
DRAPE SHEET LG 3/4 BI-LAMINATE (DRAPES) ×1 IMPLANT
DRAPE SURG 17X11 SM STRL (DRAPES) ×1 IMPLANT
DRAPE SURG ORHT 6 SPLT 77X108 (DRAPES) ×2 IMPLANT
DRAPE TOP 10253 STERILE (DRAPES) ×1 IMPLANT
DRAPE U-SHAPE 47X51 STRL (DRAPES) ×1 IMPLANT
DRSG AQUACEL AG ADV 3.5X 6 (GAUZE/BANDAGES/DRESSINGS) ×1 IMPLANT
DRSG AQUACEL AG ADV 3.5X10 (GAUZE/BANDAGES/DRESSINGS) IMPLANT
DURAPREP 26ML APPLICATOR (WOUND CARE) ×2 IMPLANT
ELECT BLADE TIP CTD 4 INCH (ELECTRODE) ×1 IMPLANT
ELECT PENCIL ROCKER SW 15FT (MISCELLANEOUS) ×1 IMPLANT
ELECT REM PT RETURN 15FT ADLT (MISCELLANEOUS) ×1 IMPLANT
FACESHIELD WRAPAROUND (MASK) ×1 IMPLANT
FACESHIELD WRAPAROUND OR TEAM (MASK) ×1 IMPLANT
GLOVE BIO SURGEON STRL SZ7.5 (GLOVE) ×1 IMPLANT
GLOVE BIOGEL PI IND STRL 6.5 (GLOVE) ×1 IMPLANT
GLOVE BIOGEL PI IND STRL 8 (GLOVE) ×1 IMPLANT
GLOVE SURG SS PI 6.5 STRL IVOR (GLOVE) ×1 IMPLANT
GOWN STRL REUS W/ TWL LRG LVL3 (GOWN DISPOSABLE) ×1 IMPLANT
GOWN STRL REUS W/ TWL XL LVL3 (GOWN DISPOSABLE) ×1 IMPLANT
HOOD PEEL AWAY T7 (MISCELLANEOUS) ×3 IMPLANT
INSERT SMALL SOCKET 32MM NEU (Insert) IMPLANT
KIT BASIN OR (CUSTOM PROCEDURE TRAY) ×1 IMPLANT
KIT TURNOVER KIT A (KITS) ×1 IMPLANT
MANIFOLD NEPTUNE II (INSTRUMENTS) ×1 IMPLANT
NDL TROCAR POINT SZ 2 1/2 (NEEDLE) IMPLANT
NEEDLE TROCAR POINT SZ 2 1/2 (NEEDLE) IMPLANT
NS IRRIG 1000ML POUR BTL (IV SOLUTION) ×1 IMPLANT
PACK SHOULDER (CUSTOM PROCEDURE TRAY) ×1 IMPLANT
PAD COLD SHLDR WRAP-ON (PAD) ×1 IMPLANT
RESTRAINT HEAD UNIVERSAL NS (MISCELLANEOUS) ×1 IMPLANT
RETRIEVER SUT HEWSON (MISCELLANEOUS) IMPLANT
SCREW BONE RSP LOCK 5X14 (Screw) IMPLANT
SCREW BONE RSP LOCK 5X18 (Screw) IMPLANT
SCREW BONE RSP LOCK 5X26 (Screw) IMPLANT
SCREW BONE RSP LOCK 5X30 (Screw) IMPLANT
SCREW RETAIN W/HEAD 4MM OFFSET (Shoulder) IMPLANT
SET HNDPC FAN SPRY TIP SCT (DISPOSABLE) ×1 IMPLANT
SLING ARM FOAM STRAP LRG (SOFTGOODS) IMPLANT
SLING ARM FOAM STRAP MED (SOFTGOODS) IMPLANT
STEM HUMERAL SM SHELL SHOU 10 (Miscellaneous) IMPLANT
STRIP CLOSURE SKIN 1/2X4 (GAUZE/BANDAGES/DRESSINGS) ×1 IMPLANT
SUCTION TUBE FRAZIER 10FR DISP (SUCTIONS) IMPLANT
SUPPORT WRAP ARM LG (MISCELLANEOUS) ×1 IMPLANT
SUT ETHIBOND 2 V 37 (SUTURE) IMPLANT
SUT MNCRL AB 4-0 PS2 18 (SUTURE) ×1 IMPLANT
SUT VIC AB 2-0 CT1 TAPERPNT 27 (SUTURE) ×2 IMPLANT
SUTURE FIBERWR#2 38 REV NDL BL (SUTURE) IMPLANT
TAP SURG THRD DJ 6.5 (ORTHOPEDIC DISPOSABLE SUPPLIES) IMPLANT
TAPE LABRALWHITE 1.5X36 (TAPE) IMPLANT
TAPE SUT LABRALTAP WHT/BLK (SUTURE) IMPLANT
TOWEL OR 17X26 10 PK STRL BLUE (TOWEL DISPOSABLE) ×1 IMPLANT
TUBE SUCTION HIGH CAP CLEAR NV (SUCTIONS) ×1 IMPLANT
WATER STERILE IRR 1000ML POUR (IV SOLUTION) ×1 IMPLANT

## 2023-12-15 NOTE — Discharge Instructions (Addendum)
 Discharge Instructions after Reverse Total Shoulder Arthroplasty   A sling has been provided for you. You are to wear this at all times (except for bathing and dressing), until your first post operative visit with Dr. Ave Filter. Please also wear while sleeping at night. While you bath and dress, let the arm/elbow extend straight down to stretch your elbow. Wiggle your fingers and pump your first while your in the sling to prevent hand swelling. Use ice on the shoulder intermittently over the first 48 hours after surgery. Continue to use ice or and ice machine as needed after 48 hours for pain control/swelling.  Pain medicine has been prescribed for you.  Use your medicine liberally over the first 48 hours, and then you can begin to taper your use. You may take Extra Strength Tylenol or Tylenol only in place of the pain pills. DO NOT take ANY nonsteroidal anti-inflammatory pain medications: Advil, Motrin, Ibuprofen, Aleve, Naproxen or Naprosyn.  Take two 81mg  aspirin a day for 2 weeks after surgery, unless you have an aspirin sensitivity/allergy or asthma.  Leave your dressing on until your first follow up visit.  You may shower with the dressing.  Hold your arm as if you still have your sling on while you shower. Simply allow the water to wash over the site and then pat dry. Make sure your axilla (armpit) is completely dry after showering.    Please call [phone] during normal business hours or [phone] after hours for any problems. Including the following:  - excessive redness of the incisions - drainage for more than 4 days - fever of more than 101.5 F  *Please note that pain medications will not be refilled after hours or on weekends.    Dental Antibiotics:  In most cases prophylactic antibiotics for Dental procdeures after total joint surgery are not necessary.  Exceptions are as follows:  1. History of prior total joint infection  2. Severely immunocompromised (Organ  Transplant, cancer chemotherapy, Rheumatoid biologic meds such as Humera)  3. Poorly controlled diabetes (A1C &gt; 8.0, blood glucose over 200)  If you have one of these conditions, contact your surgeon for an antibiotic prescription, prior to your dental procedure.

## 2023-12-15 NOTE — Progress Notes (Signed)
   12/15/23 2303  BiPAP/CPAP/SIPAP  $ Non-Invasive Home Ventilator  Initial  BiPAP/CPAP/SIPAP Pt Type Adult  BiPAP/CPAP/SIPAP DREAMSTATIOND  Mask Type Full face mask  Dentures removed? Not applicable  Set Rate 0 breaths/min  Respiratory Rate 18 breaths/min  FiO2 (%) 28 %  Flow Rate 2 lpm  Patient Home Machine No  Patient Home Mask No  Patient Home Tubing No  Auto Titrate Yes  Minimum cmH2O 5 cmH2O  Maximum cmH2O 10 cmH2O  Device Plugged into RED Power Outlet Yes

## 2023-12-15 NOTE — Telephone Encounter (Signed)
 Copied from CRM [id number]. Topic: Referral - Status >> Dec 13, 2023  2:54 PM Chantha C wrote: Reason for CRM: Maureen Sour from [employer] [phone] states has been trying to get medical clearance and fax to two different fax number numerous times and no one has called back. Maureen Sour wants to speak with a Production designer, theatre/television/film. Phone call dropped when waiting for CAL to contact the manager. Per CAL, Toy Freund is off and send to [facility], unable to send. Per CAL, send to admin pool and it will be passed to [facility]. >> Dec 13, 2023  5:01 PM Samule Crofts wrote: Eldora Greet with Maureen Sour at [facility] regarding her concerns with the surgical clearance. The fax was received on our behalf, but she was unable to get through with our office as well as on their office's end it was stating that faxes were unsuccessful which created this confusion. This has now became a time sensitive matter, in which she wanted to avoid escalation. Per telephone encounters, the surgical clearance was returned to their office this morning to the fax number that she provided of [phone], and I have since f/u with CMA's and clinical staff covering to ensure a successful fax transmission. I will leave this open until tomorrow morning just to f/u and ensure this has been completed and verify that Maureen Sour has what she needs for this patient.   Attached Clinical team member's for support in surgical clearance fax.   I spoke with Aden Agreste yesterday and she advised that the fax I sent went successfully to Ortho. Pt is having surgery today.

## 2023-12-15 NOTE — Plan of Care (Signed)
Will monitor for progress

## 2023-12-15 NOTE — Op Note (Signed)
 Procedure(s): ARTHROPLASTY, SHOULDER, TOTAL, REVERSE Procedure Note  [name] female 83 y.o. 12/15/2023  Preoperative diagnosis: Right shoulder rotator cuff tear arthropathy  Postoperative diagnosis: Same  Procedure(s) and Anesthesia Type:    * ARTHROPLASTY, SHOULDER, TOTAL, REVERSE - General   Indications:  83 y.o. 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 Pain and dysfunction interfered with quality of life and nonoperative treatment with activity modification, NSAIDS and injections failed.     Surgeon: [name]   Assistants: Abigail Dredge PA-C [name] was present and scrubbed throughout the procedure and was essential in positioning, retraction, exposure, and closure)  Anesthesia: General endotracheal anesthesia with preoperative interscalene block given by the attending anesthesiologist   Procedure Detail  ARTHROPLASTY, SHOULDER, TOTAL, REVERSE   Estimated Blood Loss:  200 mL         Drains: none  Blood Given: none          Specimens: none        Complications:  * No complications entered in OR log *         Disposition: PACU - hemodynamically stable.         Condition: stable      OPERATIVE FINDINGS:  A DJO Altivate pressfit reverse total shoulder arthroplasty was placed with a  size 10stem, a 32-4 glenosphere, and a standard-mm poly insert. The base plate  fixation was excellent.  PROCEDURE: The patient was identified in the preoperative holding area  where I personally marked the operative site after verifying site, side,  and procedure with the patient. An interscalene block given by  the attending anesthesiologist in the holding area and the patient was taken back to the operating room where all extremities were  carefully padded in position after general anesthesia was induced. She  was placed in a beach-chair position and the operative upper extremity was  prepped and draped in a standard  sterile fashion. An approximately 10-  cm incision was made from the tip of the coracoid process to the center  point of the humerus at the level of the axilla. Dissection was carried  down through subcutaneous tissues to the level of the cephalic vein  which was taken laterally with the deltoid. The pectoralis major was  retracted medially. The subdeltoid space was developed and the lateral  edge of the conjoined tendon was identified. The undersurface of  conjoined tendon was palpated and the musculocutaneous nerve was not in  the field. Retractor was placed underneath the conjoined and second  retractor was placed lateral into the deltoid. The circumflex humeral  artery and vessels were identified and clamped and coagulated. The  biceps tendon was absent.  The subscapularis was taken down as a peel with the underlying capsule.  The  joint was then gently externally rotated while the capsule was released  from the humeral neck around to just beyond the 6 o'clock position. At  this point, the joint was dislocated and the humeral head was presented  into the wound. The excessive osteophyte formation was removed with a  large rongeur.  The cutting guide was used to make the appropriate  head cut and the head was saved for potentially bone grafting.  The glenoid was exposed with the arm in an  abducted extended position. The anterior and posterior labrum were  completely excised and the capsule was released circumferentially to  allow for exposure of the glenoid for preparation. The 2.5 mm drill was  placed using  the guide in 5-10 inferior angulation and the tap was then advanced in the same hole. Small and large reamers were then used. The tap was then removed and the Metaglene was then screwed in with excellent purchase.  The peripheral guide was then used to drilled measured and filled peripheral locking screws. The size 32-4 glenosphere was then impacted on the [facility] taper and the central  screw was placed. The humerus was then again exposed and the diaphyseal reamers were used followed by the metaphyseal reamers. The final broach was left in place in the proximal trial was placed. The joint was reduced and with this implant it was felt that soft tissue tensioning was appropriate with excellent stability and excellent range of motion. Therefore, final humeral stem was placed press-fit.  And then the trial polyethylene inserts were tested again and the above implant was felt to be the most appropriate for final insertion. The joint was reduced taken through full range of motion and felt to be stable. Soft tissue tension was appropriate.  The joint was then copiously irrigated with pulse  lavage and the wound was then closed. The subscapularis was repaired loosely with one #2 FiberWire through bone tunnel.  Skin was closed with 2-0 Vicryl in a deep dermal layer and 4-0  Monocryl for skin closure. Steri-Strips were applied. Sterile  dressings were then applied as well as a sling. The patient was allowed  to awaken from general anesthesia, transferred to stretcher, and taken  to recovery room in stable condition.   POSTOPERATIVE PLAN: The patient will be observed in the recovery room and if her pain is well-controlled with regional anesthesia and she is hemodynamically stable she can be discharged home today with family.  If there is any concern we can keep her overnight for observation.

## 2023-12-15 NOTE — H&P (Signed)
 [name] is an 83 y.o. female.   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  Pain interferes with sleep and quality of life.   Past Medical History:  Diagnosis Date   Anemia    PMH;  Patient denies this dx as of 06/03/21   Anxiety    Asthma    Breast cancer (HCC) 1988   left breast cancer   CHF (congestive heart failure) (HCC)    Hx of Acute  HF   Collagenous colitis    Compression fracture    lumbar 1   Dyspnea    with exertion   Heart murmur    Hypertension    Hypothyroidism    Osteoarthritis    Osteopenia    Pneumonia 07/2020   PONV (postoperative nausea and vomiting)    Sleep apnea    wears CPAP nightly    Past Surgical History:  Procedure Laterality Date   BREAST REDUCTION SURGERY     Right   CATARACT EXTRACTION W/ INTRAOCULAR LENS  IMPLANT, BILATERAL     CESAREAN SECTION     COLONOSCOPY W/ BIOPSIES AND POLYPECTOMY     CYST EXCISION N/A 08/30/2018   Procedure: excision of posterior shoulder / back sebaceous cyst;  Surgeon: Thornell Flirt, DO;  Location: [facility];  Service: Plastics;  Laterality: N/A;   KNEE ARTHROSCOPY Left 02/23/2022   Procedure: LEFT KNEE ARTHROSCOPY;  Surgeon: Dayne Even, MD;  Location: [facility];  Service: Orthopedics;  Laterality: Left;   KYPHOPLASTY N/A 02/18/2016   Procedure: LUMBAR 1 KYPHOPLASTY;  Surgeon: Virl Grimes, MD;  Location: [facility];  Service: Orthopedics;  Laterality: N/A;  LUMBAR 1 KYPHOPLASTY   KYPHOPLASTY N/A 06/04/2021   Procedure: LUMBAR 2 KYPHOPLASTY;  Surgeon: Virl Grimes, MD;  Location: [facility];  Service: Orthopedics;  Laterality: N/A;   KYPHOPLASTY N/A 03/31/2023   Procedure: LUMBAR 4 KYPHOPLASTY;  Surgeon: Virl Grimes, MD;  Location: [facility];  Service: Orthopedics;  Laterality: N/A;   LAPAROSCOPIC APPENDECTOMY N/A 12/09/2021   Procedure: APPENDECTOMY  LAPAROSCOPIC;  Surgeon: Melvenia Stabs, MD;  Location: [facility];  Service: General;  Laterality: N/A;   LUMBAR DISC SURGERY     MODIFIED RADICAL MASTECTOMY W/ AXILLARY LYMPH NODE DISSECTION     Left   RECONSTRUCTION BREAST W/ LATISSIMUS DORSI FLAP     left   REDUCTION MAMMAPLASTY Right    right foot surgery     corrected hammer toe, straightened big toe   SEPTOPLASTY     TOE SURGERY     TONSILLECTOMY AND ADENOIDECTOMY      Family History  Problem Relation Age of Onset   Lung cancer Mother    Hypertension Mother    Hypertension Father    Ovarian cancer Sister    Hypertension Brother    Angina Brother 57   Breast cancer Maternal Aunt    Brain cancer Maternal Aunt    Kidney cancer Paternal Uncle    Prostate cancer Paternal Uncle    Breast cancer Other        cousin   Ovarian cancer Other        cousin   Kidney cancer Other        cousin   Social History:  reports that she has never smoked. She has never used smokeless tobacco. She reports current alcohol  use of about 3.0 - 4.0 standard drinks of alcohol  per  week. She reports that she does not use drugs.  Allergies:  Allergies  Allergen Reactions   Nebivolol  Hcl Other (See Comments)    Hypotensive episode  Bystolic *   Bactrim [Sulfamethoxazole-Trimethoprim] Other (See Comments)    UNSPECIFIED    Dairy Aid [Tilactase] Diarrhea and Other (See Comments)    Ice cream, sour cream   Lactose Intolerance (Gi) Diarrhea   Microplegia Msa-Msg [Cardioplegia Del Nido Formula] Other (See Comments)    Unknown reaction    Adhesive [Tape] Rash   Mobic [Meloxicam] Nausea Only   Other Nausea And Vomiting    general anesthesia   Sulfa Antibiotics Rash         Facility-Administered Medications Prior to Admission  Medication Dose Route Frequency Provider Last Rate Last Admin   methylPREDNISolone  acetate (DEPO-MEDROL ) injection 80 mg  80 mg Intramuscular Once Antonio Baumgarten, NP       Medications Prior to Admission   Medication Sig Dispense Refill   acetaminophen  (TYLENOL ) 650 MG CR tablet Take 650-1,300 mg by mouth in the morning, at noon, in the evening, and at bedtime.     albuterol  (PROVENTIL ) (2.5 MG/3ML) 0.083% nebulizer solution Take 3 mLs (2.5 mg dose) by nebulization every 6 (six) hours as needed for Wheezing. 360 mL 0   albuterol  (VENTOLIN  HFA) 108 (90 Base) MCG/ACT inhaler Inhale 2 puffs into the lungs every 6 (six) hours as needed for wheezing or shortness of breath. 54 g 3   B Complex-C (B-COMPLEX WITH VITAMIN C) tablet Take 1 tablet by mouth in the morning.     betamethasone  dipropionate (DIPROLENE ) 0.05 % ointment Apply 1 application  topically 2 (two) times daily as needed (irritation).     Calcium  Carb-Cholecalciferol  (CALTRATE 600+D3 PO) Take 1 tablet by mouth in the morning, at noon, and at bedtime.     Cholecalciferol  (VITAMIN D3 PO) Take 1 tablet by mouth in the morning.     clobetasol ointment (TEMOVATE) 0.05 % Apply 1 application  topically 2 (two) times daily as needed (irritation (lichen sclerosus)).     diclofenac sodium (VOLTAREN) 1 % GEL Apply 1 application  topically 4 (four) times daily as needed (pain.).     diltiazem  (CARDIZEM  CD) 240 MG 24 hr capsule Take 240 mg by mouth in the morning.     diphenhydrAMINE  HCl, Sleep, (ZZZQUIL PO) Take 30 mLs by mouth at bedtime.     famotidine (PEPCID) 40 MG tablet Take 40 mg by mouth at bedtime.     fluticasone -salmeterol (ADVAIR ) 100-50 MCG/ACT AEPB Inhale 1 puff into the lungs in the morning and at bedtime. 60 each 5   furosemide  (LASIX ) 40 MG tablet Take 40 mg by mouth in the morning.     hydrALAZINE  (APRESOLINE ) 50 MG tablet Take 1 tablet (50 mg total) by mouth 3 (three) times daily. 270 tablet 3   levocetirizine (XYZAL) 5 MG tablet Take 5 mg by mouth in the morning and at bedtime.     levothyroxine  (SYNTHROID , LEVOTHROID) 88 MCG tablet Take 88 mcg by mouth daily before breakfast.     losartan  (COZAAR ) 100 MG tablet Take 100 mg by mouth  in the morning.     Menthol, Topical Analgesic, (ASPERCREME MAX ROLL-ON) 16 % LIQD Apply 1 Application topically as needed (pain.).     Multiple Vitamins-Minerals (MULTIVITAMIN WITH MINERALS) tablet Take 1 tablet by mouth daily with lunch. Centrum Silver for Women     pantoprazole (PROTONIX) 40 MG tablet Take 40 mg by mouth 2 (two) times  daily.     Polyethyl Glycol-Propyl Glycol (SYSTANE) 0.4-0.3 % SOLN Place 1-2 drops into both eyes in the morning and at bedtime.     potassium chloride  SA (KLOR-CON  M20) 20 MEQ tablet TAKE 1 TABLET BY MOUTH 3 TIMES A DAY 270 tablet 3   sertraline  (ZOLOFT ) 100 MG tablet Take 100 mg by mouth 2 (two) times daily.      traMADol  (ULTRAM ) 50 MG tablet Take 50 mg by mouth 4 (four) times daily.     denosumab  (PROLIA ) 60 MG/ML SOSY injection Inject 60 mg into the skin every 6 (six) months.     NON FORMULARY Pt uses a cpap nightly     ORTHOVISC 30 MG/2ML SOSY Inject 6 mLs into the skin every 6 (six) months.      No results found for this or any previous visit (from the past 48 hours). US  LIMITED ULTRASOUND INCLUDING AXILLA LEFT BREAST  Result Date: 12/14/2023 CLINICAL DATA:  Patient complains of bruising of the left breast which has improved since late May this year. Patient has a history left breast mastectomy in 1988 with implants placed in 2008. EXAM: ULTRASOUND OF THE LEFT BREAST COMPARISON:  Prior films. FINDINGS: On physical exam, skin bruise is identify at left breast 8 o'clock. Targeted ultrasound is performed, showing no focal abnormal discrete cystic or solid lesion at the area of bruising left breast 8 o'clock. IMPRESSION: Benign findings. RECOMMENDATION: Recommend management on clinical basis. I have discussed the findings and recommendations with the patient. If applicable, a reminder letter will be sent to the patient regarding the next appointment. BI-RADS CATEGORY  2: Benign. Electronically Signed   By: Anna Barnes M.D.   On: 12/14/2023 14:48    Review of  Systems  All other systems reviewed and are negative.   Blood pressure (!) 163/64, pulse 73, temperature 98 F (36.7 C), temperature source Oral, resp. rate 18, height 4' 11.5 (1.511 m), weight 105.2 kg, SpO2 97%. Physical Exam HENT:     Head: Atraumatic.   Cardiovascular:     Pulses: Normal pulses.  Pulmonary:     Effort: Pulmonary effort is normal.   Musculoskeletal:     Comments: R shoulder pain with limited ROM. NVID.   Neurological:     Mental Status: She is alert.      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  Pain interferes with sleep and quality of life Plan R reverse TSA Risks / benefits of surgery discussed Consent on chart  NPO for OR Preop antibiotics   Audrene Blessing, MD 12/15/2023, 6:14 AM

## 2023-12-15 NOTE — Transfer of Care (Signed)
 Immediate Anesthesia Transfer of Care Note  Patient: [name]  Procedure(s) Performed: ARTHROPLASTY, SHOULDER, TOTAL, REVERSE (Right: Shoulder)  Patient Location: PACU  Anesthesia Type:General  Level of Consciousness: awake, alert , oriented, and patient cooperative  Airway & Oxygen  Therapy: Patient Spontanous Breathing and Patient connected to face mask oxygen   Post-op Assessment: Report given to RN and Post -op Vital signs reviewed and stable  Post vital signs: Reviewed and stable  Last Vitals:  Vitals Value Taken Time  BP 160/68 12/15/23 09:04  Temp    Pulse 79 12/15/23 09:08  Resp 23 12/15/23 09:08  SpO2 99 % 12/15/23 09:08  Vitals shown include unfiled device data.  Last Pain:  Vitals:   12/15/23 0603  TempSrc: Oral  PainSc:          Complications: No notable events documented.

## 2023-12-15 NOTE — Anesthesia Procedure Notes (Signed)
 Anesthesia Regional Block: Interscalene brachial plexus block   Pre-Anesthetic Checklist: , timeout performed,  Correct Patient, Correct Site, Correct Laterality,  Correct Procedure, Correct Position, site marked,  Risks and benefits discussed,  Surgical consent,  Pre-op evaluation,  At surgeon's request and post-op pain management  Laterality: Right  Prep: Maximum Sterile Barrier Precautions used, chloraprep       Needles:  Injection technique: Single-shot  Needle Type: Echogenic Stimulator Needle     Needle Length: 9cm  Needle Gauge: 22     Additional Needles:   Procedures:,,,, ultrasound used (permanent image in chart),,    Narrative:  Start time: 12/15/2023 7:10 AM End time: 12/15/2023 7:15 AM Injection made incrementally with aspirations every 5 mL.  Performed by: Personally  Anesthesiologist: [name], DO  Additional Notes: Monitors applied. No increased pain on injection. No increased resistance to injection. Injection made in 5cc increments. Good needle visualization. Patient tolerated procedure well.

## 2023-12-15 NOTE — Anesthesia Procedure Notes (Signed)
 Procedure Name: Intubation Date/Time: 12/15/2023 7:37 AM  Performed by: Mervyn Ace, CRNAPre-anesthesia Checklist: Patient identified, Emergency Drugs available, Suction available, Patient being monitored and Timeout performed Patient Re-evaluated:Patient Re-evaluated prior to induction Oxygen  Delivery Method: Circle system utilized Preoxygenation: Pre-oxygenation with 100% oxygen  Induction Type: IV induction Ventilation: Mask ventilation without difficulty Laryngoscope Size: Mac and 3 Grade View: Grade I Tube type: Oral Tube size: 7.0 mm Number of attempts: 1 Airway Equipment and Method: Stylet Placement Confirmation: ETT inserted through vocal cords under direct vision, positive ETCO2, CO2 detector and breath sounds checked- equal and bilateral Secured at: 22 cm Tube secured with: Tape (secured with pink Hy-tape) Dental Injury: Teeth and Oropharynx as per pre-operative assessment

## 2023-12-15 NOTE — Anesthesia Postprocedure Evaluation (Signed)
 Anesthesia Post Note  Patient: [name]  Procedure(s) Performed: ARTHROPLASTY, SHOULDER, TOTAL, REVERSE (Right: Shoulder)     Patient location during evaluation: PACU Anesthesia Type: Regional and General Level of consciousness: awake and alert, oriented and patient cooperative Pain management: pain level controlled Vital Signs Assessment: post-procedure vital signs reviewed and stable Respiratory status: spontaneous breathing, nonlabored ventilation and respiratory function stable Cardiovascular status: blood pressure returned to baseline and stable Postop Assessment: no apparent nausea or vomiting Anesthetic complications: no Comments: Unable to wean off oxygen  in PACU, requiring anywhere from 1LPM when at rest to 3LPM when up to bathroom. D/w patient staying overnight, Dr. Deeann Fare aware.   No notable events documented.  Last Vitals:  Vitals:   12/15/23 1130 12/15/23 1145  BP: 94/78 (!) 124/59  Pulse: 88 73  Resp: 20 (!) 25  Temp:    SpO2: 92% 94%    Last Pain:  Vitals:   12/15/23 1145  TempSrc:   PainSc: 0-No pain                 [name]

## 2023-12-15 NOTE — Plan of Care (Signed)
  Problem: Safety: Goal: Ability to remain free from injury will improve Outcome: Progressing   Problem: Pain Management: Goal: Pain level will decrease with appropriate interventions Outcome: Progressing

## 2023-12-16 ENCOUNTER — Encounter (HOSPITAL_COMMUNITY): Payer: Self-pay | Admitting: Orthopedic Surgery

## 2023-12-16 DIAGNOSIS — M75101 Unspecified rotator cuff tear or rupture of right shoulder, not specified as traumatic: Secondary | ICD-10-CM | POA: Diagnosis not present

## 2023-12-16 MED ORDER — ONDANSETRON HCL 4 MG PO TABS
4.0000 mg | ORAL_TABLET | Freq: Four times a day (QID) | ORAL | 0 refills | Status: DC | PRN
Start: 2023-12-16 — End: 2024-05-09

## 2023-12-16 MED ORDER — LOPERAMIDE HCL 2 MG PO CAPS
2.0000 mg | ORAL_CAPSULE | ORAL | Status: DC | PRN
  Administered 2023-12-16: 2 mg via ORAL
  Filled 2023-12-16: qty 1

## 2023-12-16 NOTE — Progress Notes (Signed)
 OT Cancellation Note  Patient Details Name: TEMIMA KUTSCH MRN: [id number] DOB: 1940-11-01   Cancelled Treatment:    Reason Eval/Treat Not Completed: Other (comment) (Checked waiting patient readiness due to some loose stools and husband to come shortly and OT to plan session for then.)  Will proceed with OT eval around 12-1 when husband arrives.   Blake Goya OT/L Acute Rehabilitation Department  [phone]   12/16/2023, 10:42 AM

## 2023-12-16 NOTE — Care Management Obs Status (Signed)
 MEDICARE OBSERVATION STATUS NOTIFICATION   Patient Details  Name: [name] MRN: [id number] Date of Birth: June 03, 1941   Medicare Observation Status Notification Given:  Yes    Amaryllis Junior, LCSW 12/16/2023, 9:29 AM

## 2023-12-16 NOTE — Evaluation (Signed)
 Occupational Therapy Evaluation Patient Details Name: [name] MRN: [id number] DOB: 1941/03/19 Today's Date: 12/16/2023   History of Present Illness   [name] is an 83 yo female s/p R Reverse TSA on 12/15/23. Admitted overnight d/t SOB. PMH: dyspnea, anemia, anxiety, L breast CA, HTN, hypothyroidism, OA     Clinical Impressions PTA pt lives at home with husband who is present for education and training this session.  Education completed regarding compensatory strategies for ADL tasks and functional mobility, management of sling, R ROM per specified parameters in the order set as indicated below, positioning of operative arm in sitting and supine and edema control, including use of Iceman Cold Therapy machine. Caregiver present for education, written handouts provided and reviewed using Teach Back and pt/caregiver verbalized/demonstrated understanding. Due to the below listed deficits, pt requires minimal assistance with ADL tasks and Supervision assist with functional mobility. Caregiver will be able to provide necessary level of assistance at discharge. Pt to follow up with MD to progress rehab of the operative shoulder.       If plan is discharge home, recommend the following:   A little help with walking and/or transfers;A little help with bathing/dressing/bathroom;Assistance with cooking/housework;Assist for transportation;Help with stairs or ramp for entrance     Functional Status Assessment   Patient has had a recent decline in their functional status and demonstrates the ability to make significant improvements in function in a reasonable and predictable amount of time.     Equipment Recommendations   None recommended by OT      Precautions/Restrictions   Precautions Precautions: Shoulder No shoulder ROM allowed; elbow/wrist/hand AROM only.  Required Braces or Orthoses: Sling Restrictions Weight Bearing Restrictions Per Provider Order: Yes RUE  Weight Bearing Per Provider Order: Non weight bearing Other Position/Activity Restrictions: R UE     Mobility Bed Mobility Overal bed mobility:  (in recliner at start and end of session)                  Transfers Overall transfer level: Needs assistance   Transfers: Sit to/from Stand, Bed to chair/wheelchair/BSC Sit to Stand: Supervision     Step pivot transfers: Supervision     General transfer comment: issued gait belt and instructed husband with + teach back      Balance Overall balance assessment: No apparent balance deficits (not formally assessed)                                         ADL either performed or assessed with clinical judgement   ADL Overall ADL's : Needs assistance/impaired  Per orders, R shoulder parameters as follows for ADL tasks: No shoulder ROM; elbow/wrist/hand ROM only. While moving within specified parameters, pt/caregiver instructed on bathing and how to donn/doff shirt, placing operative arm through sleeve first when donning and off last when doffing.Pt/caregiver educated on compensatory strategies for LB ADL and strategies to reduce risk of falls.  Pt/caregiver educated on donning/doffing sling and to wear the sling at all times with the exception of ADL, and to loosen the neck strap of the sling when the operative arm is in a supported position when sitting. In sitting or supine, pt instructed to have a pillow behind and under their operative arm to provide support. If assist needed with ambulation, caregiver educated on the importance of walking on pt's non-operative side.  Education regarding use of  IceMan Cold Therapy completed, including the importance of using a barrier on the shoulder prior to positioning the wrap-on pad. Pt/caregiver verbalized/demonstrated understanding. Teach Back used while caregiver assisted with dressing pt and positioning wrap-on pad to facilitate DC.                                             Vision Baseline Vision/History: 0 No visual deficits              Pertinent Vitals/Pain Pain Assessment Pain Assessment: 0-10 Pain Score: 2  Pain Location: R shoulder Pain Descriptors / Indicators: Aching, Discomfort Pain Intervention(s): Limited activity within patient's tolerance, Monitored during session, Premedicated before session, Repositioned, Ice applied     Extremity/Trunk Assessment Upper Extremity Assessment Upper Extremity Assessment: Right hand dominant;RUE deficits/detail RUE Deficits / Details: R distal elbow wrist and hand WFL AROM, n block still active R shoulder   Lower Extremity Assessment Lower Extremity Assessment: Overall WFL for tasks assessed   Cervical / Trunk Assessment Cervical / Trunk Assessment: Normal;Other exceptions (increased body habitus)   Communication Communication Communication: No apparent difficulties   Cognition Arousal: Alert Behavior During Therapy: WFL for tasks assessed/performed Cognition: No apparent impairments                               Following commands: Intact       Cueing  General Comments   Cueing Techniques: Verbal cues  R shoulder incision c/d/i dressing   Exercises Exercises: Shoulder Shoulder Exercises Elbow Flexion: 10 reps, AROM Elbow Extension: 10 reps, AROM Wrist Flexion: 10 reps, AROM Wrist Extension: 10 reps, AROM Digit Composite Flexion: 10 reps, AROM Composite Extension: 10 reps, AROM   Shoulder Instructions Shoulder Instructions Donning/doffing shirt without moving shoulder: Caregiver independent with task;Patient able to independently direct caregiver Method for sponge bathing under operated UE: Caregiver independent with task;Patient able to independently direct caregiver Donning/doffing sling/immobilizer: Caregiver independent with task;Patient able to independently direct caregiver Correct positioning of sling/immobilizer: Caregiver independent with  task;Patient able to independently direct caregiver ROM for elbow, wrist and digits of operated UE: Caregiver independent with task;Patient able to independently direct caregiver Sling wearing schedule (on at all times/off for ADL's): Caregiver independent with task;Patient able to independently direct caregiver Proper positioning of operated UE when showering: Caregiver independent with task;Patient able to independently direct caregiver Positioning of UE while sleeping: Caregiver independent with task;Patient able to independently direct caregiver    Home Living Family/patient expects to be discharged to:: Private residence Living Arrangements: Spouse/significant other Available Help at Discharge: Family Type of Home: House Home Access: Stairs to enter Secretary/administrator of Steps: 2   Home Layout: One level     Bathroom Shower/Tub: Walk-in shower;Door   [employer] Toilet: Handicapped height Bathroom Accessibility: Yes How Accessible: Accessible via walker Home Equipment: Agricultural consultant (2 wheels);BSC/3in1;Shower seat;Lift chair;Grab bars - tub/shower;Hand held shower head          Prior Functioning/Environment Prior Level of Function : Independent/Modified Independent                    OT Problem List: Impaired UE functional use    AM-PAC OT 6 Clicks Daily Activity     Outcome Measure Help from another person eating meals?: None Help from another person taking care of personal grooming?: None  Help from another person toileting, which includes using toliet, bedpan, or urinal?: A Little Help from another person bathing (including washing, rinsing, drying)?: A Little Help from another person to put on and taking off regular upper body clothing?: A Little Help from another person to put on and taking off regular lower body clothing?: A Little 6 Click Score: 20   End of Session Equipment Utilized During Treatment: Gait belt Nurse Communication: Mobility  status;Weight bearing status  Activity Tolerance: Patient tolerated treatment well Patient left: in chair;with call bell/phone within reach;with family/visitor present  OT Visit Diagnosis: Pain                Time: 1225-1305 OT Time Calculation (min): 40 min Charges:  OT General Charges $OT Visit: 1 Visit OT Evaluation $OT Eval Low Complexity: 1 Low OT Treatments $Self Care/Home Management : 23-37 mins  [name] OT/L Acute Rehabilitation Department  [phone]  12/16/2023, 2:54 PM

## 2023-12-16 NOTE — Progress Notes (Signed)
 PATIENT ID: [name]  MRN: [id number]  DOB/AGE:  03-07-1941 / 83 y.o.  1 Day Post-Op Procedure(s) (LRB): ARTHROPLASTY, SHOULDER, TOTAL, REVERSE (Right)  Subjective: Patient reports minimal pain in the right shoulder as the nerve block has not quite worn off. She is starting to get a little feeling in the fingers. She reports that she is no longer feeling short of breath. Reports that CPAP with O2 overnight and albuterol  treatment last night were helpful. Has had some nausea and vomited a couple times.   Objective: Vital signs in last 24 hours: Temp:  [97.5 F (36.4 C)-98.8 F (37.1 C)] 98.3 F (36.8 C) (06/13 0511) Pulse Rate:  [68-102] 80 (06/13 0511) Resp:  [15-29] 17 (06/13 0511) BP: (94-161)/(48-88) 147/72 (06/13 0511) SpO2:  [89 %-99 %] 97 % (06/13 0511) FiO2 (%):  [28 %] 28 % (06/12 2303)  Intake/Output from previous day: 06/12 0701 - 06/13 0700 In: 2131.6 [P.O.:600; I.V.:1331.6; IV Piggyback:200] Out: 850 [Urine:600; Emesis/NG output:200; Blood:50]  Physical Exam: Alert and oriented x3 No acute distress Intact pulses distally Incision: dressing C/D/I No cellulitis present Compartment soft Decreased sensation to light touch RUE Able to move fingers right hand No sling in place RUE   Assessment/Plan: 1 Day Post-Op Procedure(s) (LRB): ARTHROPLASTY, SHOULDER, TOTAL, REVERSE (Right)   Advance diet Up with therapy Plan for discharge today Non Weight Bearing (NWB)RUE VTE prophylaxis: aspirin  81mg  BID  Will monitor patient this morning in regards to O2 sats and for symptoms of nausea/vomiting. Seems likely that N./V is secondary to medications. Ortho tech to get new sling for right shoulder. Discussed discharge instructions and follow up plan with patient.    Breckon Reeves L. Porterfield, PA-C 12/16/2023, 8:18 AM

## 2023-12-16 NOTE — Progress Notes (Signed)
   12/16/23 0933  TOC Brief Assessment  Insurance and Status Reviewed  Patient has primary care physician Yes  Home environment has been reviewed Home w/ spouse  Prior level of function: mod independence  Prior/Current Home Services No current home services  Social Drivers of Health Review SDOH reviewed no interventions necessary  Readmission risk has been reviewed Yes  Transition of care needs no transition of care needs at this time

## 2023-12-16 NOTE — Progress Notes (Signed)
 Orthopedic Tech Progress Note Patient Details:  [name] [birth year] [id number]  Ortho Devices Type of Ortho Device: Arm sling Ortho Device/Splint Location: RUE Ortho Device/Splint Interventions: Ordered, ApplicationRN had called requesting a new arm sling, patient got sick last night and soiled her old one, so I dropped off a MEDIUM SLING to bedside. Patient was in bed with ice on her shoulder   Post Interventions Patient Tolerated: Well Instructions Provided: Care of device  Kermitt Pedlar 12/16/2023, 9:02 AM

## 2023-12-18 NOTE — Discharge Summary (Signed)
 Patient ID: [name] MRN: [id number] DOB/AGE: 1940-12-26 83 y.o.  Admit date: 12/15/2023 Discharge date: 12/16/2023  Admission Diagnoses:  Principal Problem:   S/P reverse total shoulder arthroplasty, right   Discharge Diagnoses:  Same  Past Medical History:  Diagnosis Date   Anemia    PMH;  Patient denies this dx as of 06/03/21   Anxiety    Asthma    Breast cancer (HCC) 1988   left breast cancer   CHF (congestive heart failure) (HCC)    Hx of Acute  HF   Collagenous colitis    Compression fracture    lumbar 1   Dyspnea    with exertion   Heart murmur    Hypertension    Hypothyroidism    Osteoarthritis    Osteopenia    Pneumonia 07/2020   PONV (postoperative nausea and vomiting)    Sleep apnea    wears CPAP nightly    Surgeries: Procedure(s): ARTHROPLASTY, SHOULDER, TOTAL, REVERSE on 12/15/2023   Consultants:   Discharged Condition: Improved  Hospital Course: [name] is an 83 y.o. female who was admitted 12/15/2023 for operative treatment ofS/P reverse total shoulder arthroplasty, right. Patient has severe unremitting pain that affects sleep, daily activities, and work/hobbies. After pre-op clearance the patient was taken to the operating room on 12/15/2023 and underwent  Procedure(s): ARTHROPLASTY, SHOULDER, TOTAL, REVERSE.    Patient was given perioperative antibiotics:  Anti-infectives (From admission, onward)    Start     Dose/Rate Route Frequency Ordered Stop   12/15/23 0600  ceFAZolin  (ANCEF ) IVPB 2g/100 mL premix        2 g 200 mL/hr over 30 Minutes Intravenous On call to O.R. 12/15/23 0547 12/15/23 0749        Patient was given sequential compression devices, early ambulation, and chemoprophylaxis to prevent DVT. She had low O2 sats in PACU and was admitted for further evaluation and monitoring. She responded well to O2 supplementation and aggressive use of incentive spirometer. She has a few episodes of nausea/vomiting and diarrhea  which responded to Zofran  and imodium.  Patient benefited maximally from hospital stay.    Discharge Medications:   Allergies as of 12/16/2023       Reactions   Nebivolol  Hcl Other (See Comments)   Hypotensive episode Bystolic *   Bactrim [sulfamethoxazole-trimethoprim] Other (See Comments)   UNSPECIFIED   Dairy Aid [tilactase] Diarrhea, Other (See Comments)   Ice cream, sour cream   Lactose Intolerance (gi) Diarrhea   Microplegia Msa-msg [cardioplegia [place] Formula] Other (See Comments)   Unknown reaction    Adhesive [tape] Rash   Mobic [meloxicam] Nausea Only   Other Nausea And Vomiting   general anesthesia   Sulfa Antibiotics Rash           Medication List     STOP taking these medications    traMADol  50 MG tablet Commonly known as: ULTRAM        TAKE these medications    acetaminophen  650 MG CR tablet Commonly known as: TYLENOL  Take 650-1,300 mg by mouth in the morning, at noon, in the evening, and at bedtime.   albuterol  108 (90 Base) MCG/ACT inhaler Commonly known as: VENTOLIN  HFA Inhale 2 puffs into the lungs every 6 (six) hours as needed for wheezing or shortness of breath.   albuterol  (2.5 MG/3ML) 0.083% nebulizer solution Commonly known as: PROVENTIL  Take 3 mLs (2.5 mg dose) by nebulization every 6 (six) hours as needed for Wheezing.   Aspercreme Max Roll-On  16 % Liqd Generic drug: Menthol  (Topical Analgesic) Apply 1 Application topically as needed (pain.).   B-complex with vitamin C tablet Take 1 tablet by mouth in the morning.   betamethasone  dipropionate 0.05 % ointment Commonly known as: DIPROLENE  Apply 1 application  topically 2 (two) times daily as needed (irritation).   CALTRATE 600+D3 PO Take 1 tablet by mouth in the morning, at noon, and at bedtime.   clobetasol ointment 0.05 % Commonly known as: TEMOVATE Apply 1 application  topically 2 (two) times daily as needed (irritation (lichen sclerosus)).   diclofenac sodium 1 %  Gel Commonly known as: VOLTAREN Apply 1 application  topically 4 (four) times daily as needed (pain.).   diltiazem  240 MG 24 hr capsule Commonly known as: CARDIZEM  CD Take 240 mg by mouth in the morning.   famotidine  40 MG tablet Commonly known as: PEPCID  Take 40 mg by mouth at bedtime.   fluticasone -salmeterol 100-50 MCG/ACT Aepb Commonly known as: ADVAIR  Inhale 1 puff into the lungs in the morning and at bedtime.   furosemide  40 MG tablet Commonly known as: LASIX  Take 40 mg by mouth in the morning.   hydrALAZINE  50 MG tablet Commonly known as: APRESOLINE  Take 1 tablet (50 mg total) by mouth 3 (three) times daily.   Klor-Con  M20 20 MEQ tablet Generic drug: potassium chloride  SA TAKE 1 TABLET BY MOUTH 3 TIMES A DAY   levocetirizine 5 MG tablet Commonly known as: XYZAL Take 5 mg by mouth in the morning and at bedtime.   levothyroxine  88 MCG tablet Commonly known as: SYNTHROID  Take 88 mcg by mouth daily before breakfast.   losartan  100 MG tablet Commonly known as: COZAAR  Take 100 mg by mouth in the morning.   multivitamin with minerals tablet Take 1 tablet by mouth daily with lunch. Centrum Silver for Women   NON FORMULARY Pt uses a cpap nightly   ondansetron  4 MG tablet Commonly known as: ZOFRAN  Take 1 tablet (4 mg total) by mouth every 6 (six) hours as needed for nausea.   OrthoVisc 30 MG/2ML Sosy intra-articular injection Generic drug: Hyaluronan Inject 6 mLs into the skin every 6 (six) months.   oxyCODONE  5 MG immediate release tablet Commonly known as: Roxicodone  Take 1 tablet (5 mg total) by mouth every 4 (four) hours as needed.   pantoprazole 40 MG tablet Commonly known as: PROTONIX Take 40 mg by mouth 2 (two) times daily.   Prolia  60 MG/ML Sosy injection Generic drug: denosumab  Inject 60 mg into the skin every 6 (six) months.   sertraline  100 MG tablet Commonly known as: ZOLOFT  Take 100 mg by mouth 2 (two) times daily.   Systane 0.4-0.3 %  Soln Generic drug: Polyethyl Glycol-Propyl Glycol Place 1-2 drops into both eyes in the morning and at bedtime.   tiZANidine  4 MG tablet Commonly known as: Zanaflex  Take 0.5-1 tablets (2-4 mg total) by mouth every 8 (eight) hours as needed.   VITAMIN D3 PO Take 1 tablet by mouth in the morning.   ZZZQUIL PO Take 30 mLs by mouth at bedtime.        Diagnostic Studies: US  LIMITED ULTRASOUND INCLUDING AXILLA LEFT BREAST  Result Date: 12/14/2023 CLINICAL DATA:  Patient complains of bruising of the left breast which has improved since late May this year. Patient has a history left breast mastectomy in 1988 with implants placed in 2008. EXAM: ULTRASOUND OF THE LEFT BREAST COMPARISON:  Prior films. FINDINGS: On physical exam, skin bruise is identify at left  breast 8 o'clock. Targeted ultrasound is performed, showing no focal abnormal discrete cystic or solid lesion at the area of bruising left breast 8 o'clock. IMPRESSION: Benign findings. RECOMMENDATION: Recommend management on clinical basis. I have discussed the findings and recommendations with the patient. If applicable, a reminder letter will be sent to the patient regarding the next appointment. BI-RADS CATEGORY  2: Benign. Electronically Signed   By: Anna Barnes M.D.   On: 12/14/2023 14:48    Disposition: Discharge disposition: 01-Home or Self Care          Follow-up Information     Porterfield, Brinnley Lacap, PA-C. Schedule an appointment as soon as possible for a visit on 12/30/2023.   Specialty: Orthopedic Surgery Contact information: [address] [place] [address] [phone]                  Signed: Ennis Hart L. Porterfield, PA-C 12/18/2023, 7:02 AM

## 2023-12-28 ENCOUNTER — Telehealth: Payer: Self-pay

## 2023-12-28 NOTE — Telephone Encounter (Signed)
 Copied from CRM [id number]. Topic: Clinical - Medical Advice >> Dec 08, 2023  3:04 PM Corean SAUNDERS wrote: Reason for CRM: Patient states she will be needing surgical clearance for a shoulder surgery on June 12 that is being completed at [facility] and the surgeon is Albino Herring. >> Dec 09, 2023  2:26 PM Joesph PARAS wrote: [name] is calling regarding reception of this surgical clearance, states that the fax refuses to go through. States she will try alternative number but would like a call first thing Monday morning, as patient is scheduled for operation 06/12.  C/B at [phone].  >> Dec 08, 2023  3:29 PM Thersia RAMAN wrote: Please advise if patient needs appointment. She was last seen 4/7 with [facility].   Per tele encounter on 12-15-23, Pt was never scheduled for surg clearance and Vina from [employer] requested to speak to a Production designer, theatre/television/film which was then sent to [place]. Caron, has this been taken care of?

## 2024-01-05 ENCOUNTER — Ambulatory Visit: Attending: Orthopedic Surgery

## 2024-01-05 ENCOUNTER — Other Ambulatory Visit: Payer: Self-pay

## 2024-01-05 DIAGNOSIS — M25511 Pain in right shoulder: Secondary | ICD-10-CM | POA: Insufficient documentation

## 2024-01-05 DIAGNOSIS — M6281 Muscle weakness (generalized): Secondary | ICD-10-CM | POA: Diagnosis present

## 2024-01-05 DIAGNOSIS — R293 Abnormal posture: Secondary | ICD-10-CM | POA: Insufficient documentation

## 2024-01-05 NOTE — Therapy (Signed)
 OUTPATIENT PHYSICAL THERAPY SHOULDER EVALUATION   Patient Name: [name] MRN: [id number] DOB:[birth year], 83 y.o., female Today's Date: 01/05/2024  END OF SESSION:  PT End of Session - 01/05/2024 1410      Visit Number 1    Number of Visits 16    Date for PT Re-Evaluation 03/01/2024     Authorization Type [employer] M[employer]    Authorization Time Period VL Medical Necessity     Progress Note Due on Visit 10     PT Start Time 1400    PT Stop Time 1443    PT Time Calculation (min) 43 min     Behavior During Therapy [facility] for tasks assessed/performed     Past Medical History:  Diagnosis Date   Anemia    PMH;  Patient denies this dx as of 06/03/21   Anxiety    Asthma    Breast cancer (HCC) 1988   left breast cancer   CHF (congestive heart failure) (HCC)    Hx of Acute  HF   Collagenous colitis    Compression fracture    lumbar 1   Dyspnea    with exertion   Heart murmur    Hypertension    Hypothyroidism    Osteoarthritis    Osteopenia    Pneumonia 07/2020   PONV (postoperative nausea and vomiting)    Sleep apnea    wears CPAP nightly   Past Surgical History:  Procedure Laterality Date   BREAST REDUCTION SURGERY     Right   CATARACT EXTRACTION W/ INTRAOCULAR LENS  IMPLANT, BILATERAL     CESAREAN SECTION     COLONOSCOPY W/ BIOPSIES AND POLYPECTOMY     CYST EXCISION N/A 08/30/2018   Procedure: excision of posterior shoulder / back sebaceous cyst;  Surgeon: Lowery Estefana RAMAN, DO;  Location: [facility];  Service: Plastics;  Laterality: N/A;   KNEE ARTHROSCOPY Left 02/23/2022   Procedure: LEFT KNEE ARTHROSCOPY;  Surgeon: Sheril Coy, MD;  Location: [facility];  Service: Orthopedics;  Laterality: Left;   KYPHOPLASTY N/A 02/18/2016   Procedure: LUMBAR 1 KYPHOPLASTY;  Surgeon: Oneil Priestly, MD;  Location: [facility];  Service: Orthopedics;  Laterality: N/A;  LUMBAR 1 KYPHOPLASTY   KYPHOPLASTY N/A 06/04/2021   Procedure: LUMBAR 2 KYPHOPLASTY;  Surgeon:  Priestly Oneil, MD;  Location: [facility];  Service: Orthopedics;  Laterality: N/A;   KYPHOPLASTY N/A 03/31/2023   Procedure: LUMBAR 4 KYPHOPLASTY;  Surgeon: Priestly Oneil, MD;  Location: [facility];  Service: Orthopedics;  Laterality: N/A;   LAPAROSCOPIC APPENDECTOMY N/A 12/09/2021   Procedure: APPENDECTOMY LAPAROSCOPIC;  Surgeon: Teresa Lonni HERO, MD;  Location: [facility];  Service: General;  Laterality: N/A;   LUMBAR DISC SURGERY     MODIFIED RADICAL MASTECTOMY W/ AXILLARY LYMPH NODE DISSECTION     Left   RECONSTRUCTION BREAST W/ LATISSIMUS DORSI FLAP     left   REDUCTION MAMMAPLASTY Right    REVERSE SHOULDER ARTHROPLASTY Right 12/15/2023   Procedure: ARTHROPLASTY, SHOULDER, TOTAL, REVERSE;  Surgeon: Dozier Soulier, MD;  Location: [facility];  Service: Orthopedics;  Laterality: Right;   right foot surgery     corrected hammer toe, straightened big toe   SEPTOPLASTY     TOE SURGERY     TONSILLECTOMY AND ADENOIDECTOMY     Patient Active Problem List   Diagnosis Date Noted   S/P reverse total shoulder arthroplasty, right 12/15/2023   Acute bronchitis with asthma 04/12/2023   Acute appendicitis 12/09/2021   S/P laparoscopic appendectomy  12/09/2021   CAP (community acquired pneumonia) 09/30/2020   Chronic respiratory failure with hypoxia (HCC) 09/23/2020   Allergic rhinitis 08/31/2019   Pilar cyst 08/04/2018   History of breast cancer in female 06/09/2018   History of reconstruction of both breasts 06/09/2018   Severe obesity (BMI >= 40) (HCC) 09/16/2015   Asthma with acute exacerbation 09/15/2015   DOE (dyspnea on exertion) 01/11/2014   Insomnia 05/05/2012   Osteoarthritis    Osteopenia    Collagenous colitis    OSA (obstructive sleep apnea)    Hypothyroidism    Mild persistent asthma    Hypertension    Breast cancer, left breast (HCC)     PCP: Jacques Camie Pepper, PA-C  REFERRING PROVIDER: Dozier Soulier, MD   REFERRING DIAG: S/P reverse total shoulder arthroplasty, right  [Z96.611]   THERAPY DIAG:  Right shoulder pain, unspecified chronicity  Abnormal posture  Muscle weakness (generalized)  Rationale for Evaluation and Treatment: Rehabilitation  ONSET DATE: 12/15/2023 DOS  SUBJECTIVE:                                                                                                                                                                                      SUBJECTIVE STATEMENT: Patient presents to PT 3 weeks s/p R rTSA. She states that her shoulder aches, which is better than the pain she had before surgery. She states that getting dressed is exhausting. She states that she is currently using the transport w/c due to LE weakness. Her husband is currently helping her transfer from bed to w/c.   Hand dominance: Right  PERTINENT HISTORY: Relevant PMHx includes anxiety, L breast cancer, acute HF, Lumbar compression fx, DOE, HTN< OA, oteopenia, hypothyroidisma, sleep apnea, R breast reduction procedure  PAIN:  Are you having pain? Yes: NPRS scale: 0/10 current, 5/10 with ADLs  Pain location: R shoulder Pain description: aching  Aggravating factors: ADLs (getting dressed) Relieving factors: prescribed pain medicine, OTC tylenol   PRECAUTIONS: Shoulder  RED FLAGS: None   WEIGHT BEARING RESTRICTIONS: No  FALLS:  Has patient fallen in last 6 months? No  LIVING ENVIRONMENT: Lives with: lives with their family and lives with their spouse Lives in: House/apartment Stairs: No; besides 2-steps to laundry room  Has following equipment at home: Single point cane, Environmental consultant - 4 wheeled, Wheelchair (manual), shower chair, and Grab bars  OCCUPATION: N/a  PLOF: Independent with basic ADLs, Independent with household mobility with device, and Independent with community mobility with device  PATIENT GOALS: To be able to perform toileting and grooming tasks independently.   NEXT MD VISIT:   OBJECTIVE:  Note: Objective measures were completed at  Evaluation unless otherwise  noted.  DIAGNOSTIC FINDINGS:  11/23/2023: R shoulder CT   IMPRESSION: 1. Severe osteoarthritis of the right glenohumeral joint. 2. Complete supraspinatus tendon tear. Mild supraspinatus muscle atrophy. Moderate infraspinatus muscle atrophy.  PATIENT SURVEYS:  Quick DASH: 75   COGNITION: Overall cognitive status: Within functional limits for tasks assessed  POSTURE: Mildly rounded shoulders and forward head   UPPER EXTREMITY ROM:   Passive ROM Right eval Left eval  Shoulder flexion A 115 P 90  Shoulder extension    Shoulder abduction A 150 P 65  Shoulder adduction    Shoulder internal rotation    Shoulder external rotation    Elbow flexion    Elbow extension    Wrist flexion    Wrist extension    Wrist ulnar deviation    Wrist radial deviation    Wrist pronation    Wrist supination    (Blank rows = not tested)  UPPER EXTREMITY MMT: deferred until appropriate d/t post op status   MMT Right eval Left eval  Shoulder flexion    Shoulder extension    Shoulder abduction    Shoulder adduction    Shoulder internal rotation    Shoulder external rotation    Middle trapezius    Lower trapezius    Elbow flexion    Elbow extension    Wrist flexion    Wrist extension    Wrist ulnar deviation    Wrist radial deviation    Wrist pronation    Wrist supination    Grip strength (lbs) 15 lb avg 30 lb avg  (Blank rows = not tested)                                                                                                                             TREATMENT DATE:   [facility] Adult PT Treatment:                                                DATE: 01/05/2024   Initial evaluation: see patient education and home exercise program as noted below     PATIENT EDUCATION: Education details: reviewed initial home exercise program; discussion of POC, prognosis and goals for skilled PT   Person educated: Patient Education method: Explanation,  Demonstration, and Handouts Education comprehension: verbalized understanding, returned demonstration, and needs further education  HOME EXERCISE PROGRAM: Access Code: [email or web address] URL: [email or web address] Date: 01/05/2024 Prepared by: Marko Molt  Exercises - Seated Bilateral Shoulder Flexion Towel Slide at Table Top  - 2 x daily - 7 x weekly - 2 sets - 10 reps - 3 sec hold - Seated Shoulder Pendulum Exercise  - 2 x daily - 7 x weekly - 1 sets - 5 reps - 10 sec hold - Supine Shoulder Flexion AAROM with Hands Clasped  - 2 x daily - 7 x  weekly - 2 sets - 10 reps - 3 sec hold  ASSESSMENT:  CLINICAL IMPRESSION: Avielle is a 83 y.o. female who was seen today for physical therapy evaluation and treatment for R shoulder pain and mobility deficits 3 weeks s/p Right reverse TSA. She is demonstrating diminished R shoulder PROM, decreased postural endurance, and decreased R grip strength. She has related pain and difficulty with ADLs/IADLs, including dressing, grooming, and bed mobility. She requires skilled PT services at this time to address relevant deficits and improve overall independent function.     OBJECTIVE IMPAIRMENTS: decreased activity tolerance, decreased ROM, decreased strength, impaired UE functional use, improper body mechanics, postural dysfunction, and pain.   ACTIVITY LIMITATIONS: carrying, lifting, sleeping, transfers, bed mobility, bathing, toileting, dressing, self feeding, reach over head, and hygiene/grooming  PARTICIPATION LIMITATIONS: meal prep, cleaning, laundry, and community activity  PERSONAL FACTORS: Age, Past/current experiences, and 3+ comorbidities: Relevant PMHx includes anxiety, L breast cancer, acute HF, Lumbar compression fx, DOE, HTN< OA, oteopenia, hypothyroidisma, sleep apnea, R breast reduction procedure are also affecting patient's functional outcome.   REHAB POTENTIAL: Fair    CLINICAL DECISION MAKING: Evolving/moderate  complexity  EVALUATION COMPLEXITY: Moderate   GOALS: Goals reviewed with patient? YES  SHORT TERM GOALS: Target date: 02/02/2024   Patient will be independent with initial home program at least 3 days/week.  Baseline: provided at eval Goal Status: INITIAL   2.  Patient will demonstrate improved postural awareness for at least 15 minutes while seated without need for cueing from PT.  Baseline: see objective measures Goal Status: INITIAL   3.  Patient will demonstrate improved shoulder AROM to at least 90 degrees of flexion and abduction.   Baseline: PROM 90 flexion, 65 abduction at eval  Goal status: INITIAL   LONG TERM GOALS: Target date: 03/01/2024   Patient will report improved overall functional ability with Quick DASH score of 40 or less.  Baseline: 75 Goal Status: INITIAL  2.  Patient will demonstrate improved R grip strength to at least 25lb, average of 3 trials.  Baseline: 15lb, compared to 30lb on left Goal status: INITIAL  3.  Patient will demonstrate improved functional IR AROM/reaching in order to perform toileting tasks independently.   Baseline: unable Goal status: INITIAL  4.  Patient will demonstrate improved functional ER/AROM/reaching in order to perform grooming/hair styling tasks independently.    Baseline: unable  Goal status: INITIAL    PLAN:  PT FREQUENCY: 1-2x/week  PT DURATION: 8 weeks  PLANNED INTERVENTIONS: 97164- PT Re-evaluation, 97750- Physical Performance Testing, 97110-Therapeutic exercises, 97530- Therapeutic activity, [id number]- Neuromuscular re-education, 97535- Self Care, [id number]- Manual therapy, [id number]- Aquatic Therapy, 97016- Vasopneumatic device, Patient/Family education, Cryotherapy, and Moist heat  PLAN FOR NEXT SESSION: address shoulder ROM and otherwise progress as appopriate for post op status   Marko Molt, PT, DPT  01/08/2024 4:43 PM

## 2024-01-09 ENCOUNTER — Ambulatory Visit

## 2024-01-09 DIAGNOSIS — M6281 Muscle weakness (generalized): Secondary | ICD-10-CM

## 2024-01-09 DIAGNOSIS — R293 Abnormal posture: Secondary | ICD-10-CM

## 2024-01-09 DIAGNOSIS — M25511 Pain in right shoulder: Secondary | ICD-10-CM

## 2024-01-09 NOTE — Therapy (Signed)
 OUTPATIENT PHYSICAL THERAPY SHOULDER EVALUATION   Patient Name: [name] MRN: [id number] DOB:04/11/1941, 83 y.o., female Today's Date: 01/09/2024  END OF SESSION:  PT End of Session - 01/05/2024 1410      Visit Number 1    Number of Visits 16    Date for PT Re-Evaluation 03/01/2024     Authorization Type [employer] MCR/T[employer]    Authorization Time Period VL Medical Necessity     Progress Note Due on Visit 10     PT Start Time 1400    PT Stop Time 1443    PT Time Calculation (min) 43 min     Behavior During Therapy [facility] for tasks assessed/performed    PT End of Session - 01/09/24 1358     Visit Number 2    Number of Visits 16    Date for PT Re-Evaluation 03/01/24    Authorization Type [employer] MCR/T[employer]    Authorization Time Period VL Medical Necessity    Progress Note Due on Visit 10    PT Start Time 1400    PT Stop Time 1438    PT Time Calculation (min) 38 min    Activity Tolerance Patient tolerated treatment well    Behavior During Therapy WFL for tasks assessed/performed          Past Medical History:  Diagnosis Date   Anemia    PMH;  Patient denies this dx as of 06/03/21   Anxiety    Asthma    Breast cancer (HCC) 1988   left breast cancer   CHF (congestive heart failure) (HCC)    Hx of Acute  HF   Collagenous colitis    Compression fracture    lumbar 1   Dyspnea    with exertion   Heart murmur    Hypertension    Hypothyroidism    Osteoarthritis    Osteopenia    Pneumonia 07/2020   PONV (postoperative nausea and vomiting)    Sleep apnea    wears CPAP nightly   Past Surgical History:  Procedure Laterality Date   BREAST REDUCTION SURGERY     Right   CATARACT EXTRACTION W/ INTRAOCULAR LENS  IMPLANT, BILATERAL     CESAREAN SECTION     COLONOSCOPY W/ BIOPSIES AND POLYPECTOMY     CYST EXCISION N/A 08/30/2018   Procedure: excision of posterior shoulder / back sebaceous cyst;  Surgeon: Lowery Estefana RAMAN, DO;  Location: [facility];   Service: Plastics;  Laterality: N/A;   KNEE ARTHROSCOPY Left 02/23/2022   Procedure: LEFT KNEE ARTHROSCOPY;  Surgeon: Sheril Coy, MD;  Location: [facility];  Service: Orthopedics;  Laterality: Left;   KYPHOPLASTY N/A 02/18/2016   Procedure: LUMBAR 1 KYPHOPLASTY;  Surgeon: Oneil Priestly, MD;  Location: [facility];  Service: Orthopedics;  Laterality: N/A;  LUMBAR 1 KYPHOPLASTY   KYPHOPLASTY N/A 06/04/2021   Procedure: LUMBAR 2 KYPHOPLASTY;  Surgeon: Priestly Oneil, MD;  Location: [facility];  Service: Orthopedics;  Laterality: N/A;   KYPHOPLASTY N/A 03/31/2023   Procedure: LUMBAR 4 KYPHOPLASTY;  Surgeon: Priestly Oneil, MD;  Location: [facility];  Service: Orthopedics;  Laterality: N/A;   LAPAROSCOPIC APPENDECTOMY N/A 12/09/2021   Procedure: APPENDECTOMY LAPAROSCOPIC;  Surgeon: Teresa Lonni HERO, MD;  Location: [facility];  Service: General;  Laterality: N/A;   LUMBAR DISC SURGERY     MODIFIED RADICAL MASTECTOMY W/ AXILLARY LYMPH NODE DISSECTION     Left   RECONSTRUCTION BREAST W/ LATISSIMUS DORSI FLAP     left  REDUCTION MAMMAPLASTY Right    REVERSE SHOULDER ARTHROPLASTY Right 12/15/2023   Procedure: ARTHROPLASTY, SHOULDER, TOTAL, REVERSE;  Surgeon: Dozier Soulier, MD;  Location: [facility];  Service: Orthopedics;  Laterality: Right;   right foot surgery     corrected hammer toe, straightened big toe   SEPTOPLASTY     TOE SURGERY     TONSILLECTOMY AND ADENOIDECTOMY     Patient Active Problem List   Diagnosis Date Noted   S/P reverse total shoulder arthroplasty, right 12/15/2023   Acute bronchitis with asthma 04/12/2023   Acute appendicitis 12/09/2021   S/P laparoscopic appendectomy 12/09/2021   CAP (community acquired pneumonia) 09/30/2020   Chronic respiratory failure with hypoxia (HCC) 09/23/2020   Allergic rhinitis 08/31/2019   Pilar cyst 08/04/2018   History of breast cancer in female 06/09/2018   History of reconstruction of both breasts 06/09/2018   Severe obesity (BMI >= 40) (HCC) 09/16/2015    Asthma with acute exacerbation 09/15/2015   DOE (dyspnea on exertion) 01/11/2014   Insomnia 05/05/2012   Osteoarthritis    Osteopenia    Collagenous colitis    OSA (obstructive sleep apnea)    Hypothyroidism    Mild persistent asthma    Hypertension    Breast cancer, left breast (HCC)     PCP: Jacques Camie Pepper, PA-C  REFERRING PROVIDER: Dozier Soulier, MD   REFERRING DIAG: S/P reverse total shoulder arthroplasty, right [Z96.611]   THERAPY DIAG:  Right shoulder pain, unspecified chronicity  Muscle weakness (generalized)  Abnormal posture  Rationale for Evaluation and Treatment: Rehabilitation  ONSET DATE: 12/15/2023 DOS  SUBJECTIVE:                                                                                                                                                                                      SUBJECTIVE STATEMENT: Continues to do well post-op, symptoms have improved post-op and now only present as soreness.  Discomfort managed with medication.  Hand dominance: Right  PERTINENT HISTORY: Relevant PMHx includes anxiety, L breast cancer, acute HF, Lumbar compression fx, DOE, HTN< OA, oteopenia, hypothyroidisma, sleep apnea, R breast reduction procedure  PAIN:  Are you having pain? Yes: NPRS scale: 0/10 current, 5/10 with ADLs  Pain location: R shoulder Pain description: aching  Aggravating factors: ADLs (getting dressed) Relieving factors: prescribed pain medicine, OTC tylenol   PRECAUTIONS: Shoulder  RED FLAGS: None   WEIGHT BEARING RESTRICTIONS: No  FALLS:  Has patient fallen in last 6 months? No  LIVING ENVIRONMENT: Lives with: lives with their family and lives with their spouse Lives in: House/apartment Stairs: No; besides 2-steps to laundry room  Has following equipment at home: Single point cane,  Walker - 4 wheeled, Wheelchair (manual), shower chair, and Grab bars  OCCUPATION: N/a  PLOF: Independent with basic ADLs,  Independent with household mobility with device, and Independent with community mobility with device  PATIENT GOALS: To be able to perform toileting and grooming tasks independently.   NEXT MD VISIT: July 27  OBJECTIVE:  Note: Objective measures were completed at Evaluation unless otherwise noted.  DIAGNOSTIC FINDINGS:  11/23/2023: R shoulder CT   IMPRESSION: 1. Severe osteoarthritis of the right glenohumeral joint. 2. Complete supraspinatus tendon tear. Mild supraspinatus muscle atrophy. Moderate infraspinatus muscle atrophy.  PATIENT SURVEYS:  Quick DASH: 75   COGNITION: Overall cognitive status: Within functional limits for tasks assessed  POSTURE: Mildly rounded shoulders and forward head   UPPER EXTREMITY ROM:   Passive ROM Right eval Left eval  Shoulder flexion A 115 P 90  Shoulder extension    Shoulder abduction A 150 P 65  Shoulder adduction    Shoulder internal rotation    Shoulder external rotation    Elbow flexion    Elbow extension    Wrist flexion    Wrist extension    Wrist ulnar deviation    Wrist radial deviation    Wrist pronation    Wrist supination    (Blank rows = not tested)  UPPER EXTREMITY MMT: deferred until appropriate d/t post op status   MMT Right eval Left eval  Shoulder flexion    Shoulder extension    Shoulder abduction    Shoulder adduction    Shoulder internal rotation    Shoulder external rotation    Middle trapezius    Lower trapezius    Elbow flexion    Elbow extension    Wrist flexion    Wrist extension    Wrist ulnar deviation    Wrist radial deviation    Wrist pronation    Wrist supination    Grip strength (lbs) 15 lb avg 30 lb avg  (Blank rows = not tested)                                                                                                                             TREATMENT DATE:  [facility] Adult PT Treatment:                                                DATE: 01/09/24 Neuromuscular  re-ed: Isometric shoulder flex/ext/abd 3s 10x Supine elbow flexion 15x w/tennis ball Supine press 15x w/tennis ball Seated R abd elbow flexed 15x Seated OH press with tennis ball 15x Therapeutic Activity: Shrugs 10x Scapular retractions 10x Seated bicep curls PROM R shoulder 90d abd, 90d flexion   [facility] Adult PT Treatment:  DATE: 01/05/2024   Initial evaluation: see patient education and home exercise program as noted below     PATIENT EDUCATION: Education details: reviewed initial home exercise program; discussion of POC, prognosis and goals for skilled PT   Person educated: Patient Education method: Explanation, Demonstration, and Handouts Education comprehension: verbalized understanding, returned demonstration, and needs further education  HOME EXERCISE PROGRAM: Access Code: [email or web address] URL: [email or web address] Date: 01/05/2024 Prepared by: Marko Molt  Exercises - Seated Bilateral Shoulder Flexion Towel Slide at Table Top  - 2 x daily - 7 x weekly - 2 sets - 10 reps - 3 sec hold - Seated Shoulder Pendulum Exercise  - 2 x daily - 7 x weekly - 1 sets - 5 reps - 10 sec hold - Supine Shoulder Flexion AAROM with Hands Clasped  - 2 x daily - 7 x weekly - 2 sets - 10 reps - 3 sec hold  ASSESSMENT:  CLINICAL IMPRESSION:  Focus of today was progressing patient along protocol guidelines and emphasizing ROM, active and passive.  Incorporated allowed AROM for strength and stability.  Able to tolerate session w/o elevated symptoms.   Lissette is a 83 y.o. female who was seen today for physical therapy evaluation and treatment for R shoulder pain and mobility deficits 3 weeks s/p Right reverse TSA. She is demonstrating diminished R shoulder PROM, decreased postural endurance, and decreased R grip strength. She has related pain and difficulty with ADLs/IADLs, including dressing, grooming, and bed mobility. She requires skilled PT  services at this time to address relevant deficits and improve overall independent function.     OBJECTIVE IMPAIRMENTS: decreased activity tolerance, decreased ROM, decreased strength, impaired UE functional use, improper body mechanics, postural dysfunction, and pain.   ACTIVITY LIMITATIONS: carrying, lifting, sleeping, transfers, bed mobility, bathing, toileting, dressing, self feeding, reach over head, and hygiene/grooming  PARTICIPATION LIMITATIONS: meal prep, cleaning, laundry, and community activity  PERSONAL FACTORS: Age, Past/current experiences, and 3+ comorbidities: Relevant PMHx includes anxiety, L breast cancer, acute HF, Lumbar compression fx, DOE, HTN< OA, oteopenia, hypothyroidisma, sleep apnea, R breast reduction procedure are also affecting patient's functional outcome.   REHAB POTENTIAL: Fair    CLINICAL DECISION MAKING: Evolving/moderate complexity  EVALUATION COMPLEXITY: Moderate   GOALS: Goals reviewed with patient? YES  SHORT TERM GOALS: Target date: 02/02/2024   Patient will be independent with initial home program at least 3 days/week.  Baseline: provided at eval Goal Status: INITIAL   2.  Patient will demonstrate improved postural awareness for at least 15 minutes while seated without need for cueing from PT.  Baseline: see objective measures Goal Status: INITIAL   3.  Patient will demonstrate improved shoulder AROM to at least 90 degrees of flexion and abduction.   Baseline: PROM 90 flexion, 65 abduction at eval  Goal status: INITIAL   LONG TERM GOALS: Target date: 03/01/2024   Patient will report improved overall functional ability with Quick DASH score of 40 or less.  Baseline: 75 Goal Status: INITIAL  2.  Patient will demonstrate improved R grip strength to at least 25lb, average of 3 trials.  Baseline: 15lb, compared to 30lb on left Goal status: INITIAL  3.  Patient will demonstrate improved functional IR AROM/reaching in order to perform  toileting tasks independently.   Baseline: unable Goal status: INITIAL  4.  Patient will demonstrate improved functional ER/AROM/reaching in order to perform grooming/hair styling tasks independently.    Baseline: unable  Goal status: INITIAL    PLAN:  PT FREQUENCY: 1-2x/week  PT DURATION: 8 weeks  PLANNED INTERVENTIONS: 97164- PT Re-evaluation, 97750- Physical Performance Testing, 97110-Therapeutic exercises, 97530- Therapeutic activity, 97112- Neuromuscular re-education, 97535- Self Care, [id number]- Manual therapy, [phone]- Aquatic Therapy, 97016- Vasopneumatic device, Patient/Family education, Cryotherapy, and Moist heat  PLAN FOR NEXT SESSION: address shoulder ROM and otherwise progress as appopriate for post op status   Marko Molt, PT, DPT  01/09/2024 2:47 PM   REVERSE SHOULDER ARTHROPLASTY REHABILITATION GUIDELINES These guidelines do NOT apply to RTSA for proximal humeral fracture - rehabilitation following PHFx will commence 4-6 weeks after surgery as deemed appropriate to protect tuberosity healing PHASE PRECAUTIONS AND GUIDELINES GOALS EXERCISES CRITERIA TO ADVANCE  EXAMINATION  1 (Post-operative day 1 to post-operative week 3)       PHASE PRECAUTIONS AND GUIDELINES GOALS EXERCISES CRITERIA TO ADVANCE  EXAMINATION  2 (Post-operative week 3 to 6) May discontinue sling use at 3 weeks; after 2 weeks can remove the sling at home and just use the sling at night and in community for 3rd week  May use arm for basic activities of daily living (such as feeding, brushing teeth, dressing.)   May submerge in water  after 4 weeks  Avoid combined IR/EXT/ADD (hand behind the back) and IR/ADD (reaching across chest) for dislocation precautions  Avoid acromial or scapular spine pain as increase deltoid loading - decrease load if this occurs  Elevation to 130 deg and ER to 30 deg - passive, active assisted or active  Low (less than 3/10) to no pain  Ability to fire all heads of the  deltoid May discontinue grip, and active elbow and wrist exercises since using the arm in ADLs with sling removed around the home  Continue elevation to 130 and ER to 30, both in scapular plane   Submaximal isometrics (pain-free effort) for all functional heads of deltoid (anterior, posterior, middle).   Active exercise as able:  Supine forward punch  Place in balanced position with circumduction and progressive arcs in sagittal plane  Side-lying abduction to 90   Lateral raise with bent elbow   Prone extension to hip  Elevation in scapular plane to 130; ER in scapular plane to 30   Ability to fire isometrically all heads of the deltoid muscle without pain  Ability to place and hold the arm in balanced position (90 deg elevation in supine) Wound assessment  Neurovascular assessment  Swelling assessment  ROM shoulder elevation and ER(0)  Patient reported outcome measure  Pain level    PHASE PRECAUTIONS AND GUIDELINES GOALS EXERCISES CRITERIA TO ADVANCE  EXAMINATION  3 (Post-operative week 6 to 12) Avoid forceful end-range motion in any direction   Progress active use of the arm in ADLs without being restricted to arm by the side of the body;   No heavy lifting or carrying  Initiate functional IR behind the back gently without forceful overpressure  Avoid acromial or scapular spine pain as increase deltoid loading - decrease load if this occurs  NO UPPER BODY ERGOMETER  Optimize ROM for elevation and ER in scapular plane   Expected PROM: Elevation - 145-160; ER - 40-50 ; functional IR to L1  Recover AROM to approach as close to PROM available as possible  Establish dynamic stability of the shoulder  Forward elevation in scapular plane active progression: supine to incline to vertical; short to long lever arm  Lateral raise with bent elbow; side-lying abduction  Active ER/IR with arm at side  Scapular retraction with light band resistance  Serratus anterior  punches  in supine; avoid wall, incline or prone press-ups for serratus anterior  Functional IR with hand slide up back - very gentle and gradual   AROM equals/approaches PROM with good mechanics for elevation  No pain  Higher level demand on shoulder than ADL functions PROM for elevation, ER(0)  AROM for elevation, ER(0) and functional IR  Patient reported outcome measure  Pain             PHASE PRECAUTIONS AND GUIDELINES GOALS EXERCISES CRITERIA TO ADVANCE  EXAMINATION  4 (Post-operative week 12+) No heavy lifting and no overhead sports  Weight lifting limit 25.lb  No heavy pushing activity  Gradually increase strength   NO UPPER BODY ERGOMETER  Optimize functional use of operative UE to patient specific goals  Gradual increase in deltoid, scapular muscle and rotator cuff strength  Pain-free functional activities Light hand weights for deltoid up to and not to exceed 3 lbs for anterior and posterior with long arm lift against gravity; elbow bent to 90 deg for abduction in scapular plane  Band progression for extension to hip with scapular depression/retraction  Band progression for serratus anterior punches in supine; avoid wall, incline or prone press-ups for serratus anterior  End-range stretching gently without forceful overpressure in all planes (elevation in scapular plane, ER in scapular plane, functional IR) with stretching done for life as part of daily routine  NO UPPER BODY ERGOMETER  Pain-free AROM for shoulder elevation (expect around 135-150 deg)  Functional strength for all ADLs, work tasks, and hobbies approved by [employer]er, television/film set with home maintenance program PROM for elevation, ER(0); ER(90)  AROM for elevation, ER(0) and functional IR  Scapulohumeral rhythm/biomechanics of active movement strategies  Strength testing for deltoid, RTC, scapular muscles  Patient reported outcome measure  Pain

## 2024-01-09 NOTE — Therapy (Unsigned)
 OUTPATIENT PHYSICAL THERAPY SHOULDER EVALUATION   Patient Name: [name] MRN: [id number] DOB:[birth year], 83 y.o., female Today's Date: 01/11/2024  END OF SESSION:     PT End of Session - 01/11/24 1453     Visit Number 3    Number of Visits 16    Date for PT Re-Evaluation 03/01/24    Authorization Type [employer] M[employer]    Authorization Time Period VL Medical Necessity    Progress Note Due on Visit 10    PT Start Time 1450    PT Stop Time 1530    PT Time Calculation (min) 40 min    Activity Tolerance Patient tolerated treatment well    Behavior During Therapy [facility] for tasks assessed/performed           Past Medical History:  Diagnosis Date   Anemia    PMH;  Patient denies this dx as of 06/03/21   Anxiety    Asthma    Breast cancer (HCC) 1988   left breast cancer   CHF (congestive heart failure) (HCC)    Hx of Acute  HF   Collagenous colitis    Compression fracture    lumbar 1   Dyspnea    with exertion   Heart murmur    Hypertension    Hypothyroidism    Osteoarthritis    Osteopenia    Pneumonia 07/2020   PONV (postoperative nausea and vomiting)    Sleep apnea    wears CPAP nightly   Past Surgical History:  Procedure Laterality Date   BREAST REDUCTION SURGERY     Right   CATARACT EXTRACTION W/ INTRAOCULAR LENS  IMPLANT, BILATERAL     CESAREAN SECTION     COLONOSCOPY W/ BIOPSIES AND POLYPECTOMY     CYST EXCISION N/A 08/30/2018   Procedure: excision of posterior shoulder / back sebaceous cyst;  Surgeon: Lowery Estefana RAMAN, DO;  Location: [facility];  Service: Plastics;  Laterality: N/A;   KNEE ARTHROSCOPY Left 02/23/2022   Procedure: LEFT KNEE ARTHROSCOPY;  Surgeon: Sheril Coy, MD;  Location: [facility];  Service: Orthopedics;  Laterality: Left;   KYPHOPLASTY N/A 02/18/2016   Procedure: LUMBAR 1 KYPHOPLASTY;  Surgeon: Oneil Priestly, MD;  Location: [facility];  Service: Orthopedics;  Laterality: N/A;  LUMBAR 1 KYPHOPLASTY   KYPHOPLASTY  N/A 06/04/2021   Procedure: LUMBAR 2 KYPHOPLASTY;  Surgeon: Priestly Oneil, MD;  Location: [facility];  Service: Orthopedics;  Laterality: N/A;   KYPHOPLASTY N/A 03/31/2023   Procedure: LUMBAR 4 KYPHOPLASTY;  Surgeon: Priestly Oneil, MD;  Location: [facility];  Service: Orthopedics;  Laterality: N/A;   LAPAROSCOPIC APPENDECTOMY N/A 12/09/2021   Procedure: APPENDECTOMY LAPAROSCOPIC;  Surgeon: Teresa Lonni HERO, MD;  Location: [facility];  Service: General;  Laterality: N/A;   LUMBAR DISC SURGERY     MODIFIED RADICAL MASTECTOMY W/ AXILLARY LYMPH NODE DISSECTION     Left   RECONSTRUCTION BREAST W/ LATISSIMUS DORSI FLAP     left   REDUCTION MAMMAPLASTY Right    REVERSE SHOULDER ARTHROPLASTY Right 12/15/2023   Procedure: ARTHROPLASTY, SHOULDER, TOTAL, REVERSE;  Surgeon: Dozier Soulier, MD;  Location: [facility];  Service: Orthopedics;  Laterality: Right;   right foot surgery     corrected hammer toe, straightened big toe   SEPTOPLASTY     TOE SURGERY     TONSILLECTOMY AND ADENOIDECTOMY     Patient Active Problem List   Diagnosis Date Noted   S/P reverse total shoulder arthroplasty, 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/2023   Acute bronchitis  with asthma 04/12/2023   Acute appendicitis 12/09/2021   S/P laparoscopic appendectomy 12/09/2021   CAP (community acquired pneumonia) 09/30/2020   Chronic respiratory failure with hypoxia (HCC) 09/23/2020   Allergic rhinitis 08/31/2019   Pilar cyst 08/04/2018   History of breast cancer in female 06/09/2018   History of reconstruction of both breasts 06/09/2018   Severe obesity (BMI >= 40) (HCC) 09/16/2015   Asthma with acute exacerbation 09/15/2015   DOE (dyspnea on exertion) 01/11/2014   Insomnia 05/05/2012   Osteoarthritis    Osteopenia    Collagenous colitis    OSA (obstructive sleep apnea)    Hypothyroidism    Mild persistent asthma    Hypertension    Breast cancer, left breast (HCC)     PCP: Jacques Camie Pepper, PA-C  REFERRING PROVIDER: Dozier Soulier, MD    REFERRING DIAG: S/P reverse total shoulder arthroplasty, right [Z96.611]   THERAPY DIAG:  Right shoulder pain, unspecified chronicity  Muscle weakness (generalized)  Abnormal posture  Rationale for Evaluation and Treatment: Rehabilitation  ONSET DATE: 12/15/2023 DOS  SUBJECTIVE:                                                                                                                                                                                      SUBJECTIVE STATEMENT: Continues to do well post-op, symptoms have improved post-op and now only present as soreness.  Discomfort managed with medication.  Hand dominance: Right  PERTINENT HISTORY: Relevant PMHx includes anxiety, L breast cancer, acute HF, Lumbar compression fx, DOE, HTN< OA, oteopenia, hypothyroidisma, sleep apnea, R breast reduction procedure  PAIN:  Are you having pain? Yes: NPRS scale: 0/10 current, 5/10 with ADLs  Pain location: R shoulder Pain description: aching  Aggravating factors: ADLs (getting dressed) Relieving factors: prescribed pain medicine, OTC tylenol   PRECAUTIONS: Shoulder  RED FLAGS: None   WEIGHT BEARING RESTRICTIONS: No  FALLS:  Has patient fallen in last 6 months? No  LIVING ENVIRONMENT: Lives with: lives with their family and lives with their spouse Lives in: House/apartment Stairs: No; besides 2-steps to laundry room  Has following equipment at home: Single point cane, Environmental consultant - 4 wheeled, Wheelchair (manual), shower chair, and Grab bars  OCCUPATION: N/a  PLOF: Independent with basic ADLs, Independent with household mobility with device, and Independent with community mobility with device  PATIENT GOALS: To be able to perform toileting and grooming tasks independently.   NEXT MD VISIT: July 27  OBJECTIVE:  Note: Objective measures were completed at Evaluation unless otherwise noted.  DIAGNOSTIC FINDINGS:  11/23/2023: R shoulder CT   IMPRESSION: 1. Severe  osteoarthritis of the right glenohumeral joint. 2. Complete supraspinatus tendon  tear. Mild supraspinatus muscle atrophy. Moderate infraspinatus muscle atrophy.  PATIENT SURVEYS:  Quick DASH: 75   COGNITION: Overall cognitive status: Within functional limits for tasks assessed  POSTURE: Mildly rounded shoulders and forward head   UPPER EXTREMITY ROM:   Passive ROM Right eval Left eval  Shoulder flexion A 115 P 90  Shoulder extension    Shoulder abduction A 150 P 65  Shoulder adduction    Shoulder internal rotation    Shoulder external rotation    Elbow flexion    Elbow extension    Wrist flexion    Wrist extension    Wrist ulnar deviation    Wrist radial deviation    Wrist pronation    Wrist supination    (Blank rows = not tested)  UPPER EXTREMITY MMT: deferred until appropriate d/t post op status   MMT Right eval Left eval  Shoulder flexion    Shoulder extension    Shoulder abduction    Shoulder adduction    Shoulder internal rotation    Shoulder external rotation    Middle trapezius    Lower trapezius    Elbow flexion    Elbow extension    Wrist flexion    Wrist extension    Wrist ulnar deviation    Wrist radial deviation    Wrist pronation    Wrist supination    Grip strength (lbs) 15 lb avg 30 lb avg  (Blank rows = not tested)                                                                                                                             TREATMENT DATE:  [facility] Adult PT Treatment:                                                DATE: 01/11/24 Therapeutic Exercise: Seated scap squeezes 15x Seated shrugs 15x Supine press with Ranger 15x Supine OH flexion with Ranger 15x Manual Therapy: PROM abd/ER/FF Gentle joint mobs into distraction/inferior/posterior directions 5x10 Neuromuscular re-ed: Isometric shoulder flex/ext/abd 3s 10x Supine elbow flexion 15x w/tennis ball Supine press 15x w/tennis ball Seated R abd elbow flexed 15x Seated OH  press with tennis ball 15x   [facility] Adult PT Treatment:                                                DATE: 01/09/24 Neuromuscular re-ed: Isometric shoulder flex/ext/abd 3s 10x Supine elbow flexion 15x w/tennis ball Supine press 15x w/tennis ball Seated R abd elbow flexed 15x Seated OH press with tennis ball 15x Therapeutic Activity: Shrugs 10x Scapular retractions 10x Seated bicep curls PROM R shoulder 90d abd, 90d flexion   [facility]  Adult PT Treatment:                                                DATE: 01/05/2024   Initial evaluation: see patient education and home exercise program as noted below     PATIENT EDUCATION: Education details: reviewed initial home exercise program; discussion of POC, prognosis and goals for skilled PT   Person educated: Patient Education method: Explanation, Demonstration, and Handouts Education comprehension: verbalized understanding, returned demonstration, and needs further education  HOME EXERCISE PROGRAM: Access Code: [email or web address] URL: [email or web address] Date: 01/05/2024 Prepared by: Marko Molt  Exercises - Seated Bilateral Shoulder Flexion Towel Slide at Table Top  - 2 x daily - 7 x weekly - 2 sets - 10 reps - 3 sec hold - Seated Shoulder Pendulum Exercise  - 2 x daily - 7 x weekly - 1 sets - 5 reps - 10 sec hold - Supine Shoulder Flexion AAROM with Hands Clasped  - 2 x daily - 7 x weekly - 2 sets - 10 reps - 3 sec hold  ASSESSMENT:  CLINICAL IMPRESSION:  Continued to focus on ROM adding AAROM with ranfer and expanding exercises into seated and supine positions.   Sennie is a 83 y.o. female who was seen today for physical therapy evaluation and treatment for R shoulder pain and mobility deficits 3 weeks s/p Right reverse TSA. She is demonstrating diminished R shoulder PROM, decreased postural endurance, and decreased R grip strength. She has related pain and difficulty with ADLs/IADLs, including dressing, grooming, and bed  mobility. She requires skilled PT services at this time to address relevant deficits and improve overall independent function.     OBJECTIVE IMPAIRMENTS: decreased activity tolerance, decreased ROM, decreased strength, impaired UE functional use, improper body mechanics, postural dysfunction, and pain.   ACTIVITY LIMITATIONS: carrying, lifting, sleeping, transfers, bed mobility, bathing, toileting, dressing, self feeding, reach over head, and hygiene/grooming  PARTICIPATION LIMITATIONS: meal prep, cleaning, laundry, and community activity  PERSONAL FACTORS: Age, Past/current experiences, and 3+ comorbidities: Relevant PMHx includes anxiety, L breast cancer, acute HF, Lumbar compression fx, DOE, HTN< OA, oteopenia, hypothyroidisma, sleep apnea, R breast reduction procedure are also affecting patient's functional outcome.   REHAB POTENTIAL: Fair    CLINICAL DECISION MAKING: Evolving/moderate complexity  EVALUATION COMPLEXITY: Moderate   GOALS: Goals reviewed with patient? YES  SHORT TERM GOALS: Target date: 02/02/2024   Patient will be independent with initial home program at least 3 days/week.  Baseline: provided at eval Goal Status: INITIAL   2.  Patient will demonstrate improved postural awareness for at least 15 minutes while seated without need for cueing from PT.  Baseline: see objective measures Goal Status: INITIAL   3.  Patient will demonstrate improved shoulder AROM to at least 90 degrees of flexion and abduction.   Baseline: PROM 90 flexion, 65 abduction at eval  Goal status: INITIAL   LONG TERM GOALS: Target date: 03/01/2024   Patient will report improved overall functional ability with Quick DASH score of 40 or less.  Baseline: 75 Goal Status: INITIAL  2.  Patient will demonstrate improved R grip strength to at least 25lb, average of 3 trials.  Baseline: 15lb, compared to 30lb on left Goal status: INITIAL  3.  Patient will demonstrate improved functional IR  AROM/reaching in order to perform toileting tasks independently.   Baseline: unable Goal  status: INITIAL  4.  Patient will demonstrate improved functional ER/AROM/reaching in order to perform grooming/hair styling tasks independently.    Baseline: unable  Goal status: INITIAL    PLAN:  PT FREQUENCY: 1-2x/week  PT DURATION: 8 weeks  PLANNED INTERVENTIONS: 02835- PT Re-evaluation, 97750- Physical Performance Testing, 97110-Therapeutic exercises, 97530- Therapeutic activity, 97112- Neuromuscular re-education, 97535- Self Care, 02859- Manual therapy, [phone]- Aquatic Therapy, [id number]- Vasopneumatic device, Patient/Family education, Cryotherapy, and Moist heat  PLAN FOR NEXT SESSION: address shoulder ROM and otherwise progress as appopriate for post op status   Marko Molt, PT, DPT  01/11/2024 3:32 PM   REVERSE SHOULDER ARTHROPLASTY REHABILITATION GUIDELINES These guidelines do NOT apply to RTSA for proximal humeral fracture - rehabilitation following PHFx will commence 4-6 weeks after surgery as deemed appropriate to protect tuberosity healing PHASE PRECAUTIONS AND GUIDELINES GOALS EXERCISES CRITERIA TO ADVANCE  EXAMINATION  1 (Post-operative day 1 to post-operative week 3)       PHASE PRECAUTIONS AND GUIDELINES GOALS EXERCISES CRITERIA TO ADVANCE  EXAMINATION  2 (Post-operative week 3 to 6) May discontinue sling use at 3 weeks; after 2 weeks can remove the sling at home and just use the sling at night and in community for 3rd week  May use arm for basic activities of daily living (such as feeding, brushing teeth, dressing.)   May submerge in water  after 4 weeks  Avoid combined IR/EXT/ADD (hand behind the back) and IR/ADD (reaching across chest) for dislocation precautions  Avoid acromial or scapular spine pain as increase deltoid loading - decrease load if this occurs  Elevation to 130 deg and ER to 30 deg - passive, active assisted or active  Low (less than 3/10) to no  pain  Ability to fire all heads of the deltoid May discontinue grip, and active elbow and wrist exercises since using the arm in ADLs with sling removed around the home  Continue elevation to 130 and ER to 30, both in scapular plane   Submaximal isometrics (pain-free effort) for all functional heads of deltoid (anterior, posterior, middle).   Active exercise as able:  Supine forward punch  Place in balanced position with circumduction and progressive arcs in sagittal plane  Side-lying abduction to 90   Lateral raise with bent elbow   Prone extension to hip  Elevation in scapular plane to 130; ER in scapular plane to 30   Ability to fire isometrically all heads of the deltoid muscle without pain  Ability to place and hold the arm in balanced position (90 deg elevation in supine) Wound assessment  Neurovascular assessment  Swelling assessment  ROM shoulder elevation and ER(0)  Patient reported outcome measure  Pain level    PHASE PRECAUTIONS AND GUIDELINES GOALS EXERCISES CRITERIA TO ADVANCE  EXAMINATION  3 (Post-operative week 6 to 12) Avoid forceful end-range motion in any direction   Progress active use of the arm in ADLs without being restricted to arm by the side of the body;   No heavy lifting or carrying  Initiate functional IR behind the back gently without forceful overpressure  Avoid acromial or scapular spine pain as increase deltoid loading - decrease load if this occurs  NO UPPER BODY ERGOMETER  Optimize ROM for elevation and ER in scapular plane   Expected PROM: Elevation - 145-160; ER - 40-50 ; functional IR to L1  Recover AROM to approach as close to PROM available as possible  Establish dynamic stability of the shoulder  Forward elevation in scapular plane active progression:  supine to incline to vertical; short to long lever arm  Lateral raise with bent elbow; side-lying abduction  Active ER/IR with arm at side  Scapular retraction with  light band resistance  Serratus anterior punches in supine; avoid wall, incline or prone press-ups for serratus anterior  Functional IR with hand slide up back - very gentle and gradual   AROM equals/approaches PROM with good mechanics for elevation  No pain  Higher level demand on shoulder than ADL functions PROM for elevation, ER(0)  AROM for elevation, ER(0) and functional IR  Patient reported outcome measure  Pain             PHASE PRECAUTIONS AND GUIDELINES GOALS EXERCISES CRITERIA TO ADVANCE  EXAMINATION  4 (Post-operative week 12+) No heavy lifting and no overhead sports  Weight lifting limit 25.lb  No heavy pushing activity  Gradually increase strength   NO UPPER BODY ERGOMETER  Optimize functional use of operative UE to patient specific goals  Gradual increase in deltoid, scapular muscle and rotator cuff strength  Pain-free functional activities Light hand weights for deltoid up to and not to exceed 3 lbs for anterior and posterior with long arm lift against gravity; elbow bent to 90 deg for abduction in scapular plane  Band progression for extension to hip with scapular depression/retraction  Band progression for serratus anterior punches in supine; avoid wall, incline or prone press-ups for serratus anterior  End-range stretching gently without forceful overpressure in all planes (elevation in scapular plane, ER in scapular plane, functional IR) with stretching done for life as part of daily routine  NO UPPER BODY ERGOMETER  Pain-free AROM for shoulder elevation (expect around 135-150 deg)  Functional strength for all ADLs, work tasks, and hobbies approved by Designer, television/film set with home maintenance program PROM for elevation, ER(0); ER(90)  AROM for elevation, ER(0) and functional IR  Scapulohumeral rhythm/biomechanics of active movement strategies  Strength testing for deltoid, RTC, scapular muscles  Patient reported outcome measure  Pain

## 2024-01-11 ENCOUNTER — Ambulatory Visit

## 2024-01-11 DIAGNOSIS — M6281 Muscle weakness (generalized): Secondary | ICD-10-CM

## 2024-01-11 DIAGNOSIS — R293 Abnormal posture: Secondary | ICD-10-CM

## 2024-01-11 DIAGNOSIS — M25511 Pain in right shoulder: Secondary | ICD-10-CM | POA: Diagnosis not present

## 2024-01-16 NOTE — Therapy (Unsigned)
 OUTPATIENT PHYSICAL THERAPY TREATMENT NOTE   Patient Name: [name] MRN: [id number] DOB:Mar 19, 1941, 83 y.o., female Today's Date: 01/17/2024  END OF SESSION:     PT End of Session - 01/17/24 1321     Visit Number 4    Number of Visits 16    Date for PT Re-Evaluation 03/01/24    Authorization Type [employer] M[employer]    Authorization Time Period VL Medical Necessity    Progress Note Due on Visit 10    PT Start Time 1315    PT Stop Time 1355    PT Time Calculation (min) 40 min    Activity Tolerance Patient tolerated treatment well    Behavior During Therapy WFL for tasks assessed/performed            Past Medical History:  Diagnosis Date   Anemia    PMH;  Patient denies this dx as of 06/03/21   Anxiety    Asthma    Breast cancer (HCC) 1988   left breast cancer   CHF (congestive heart failure) (HCC)    Hx of Acute  HF   Collagenous colitis    Compression fracture    lumbar 1   Dyspnea    with exertion   Heart murmur    Hypertension    Hypothyroidism    Osteoarthritis    Osteopenia    Pneumonia 07/2020   PONV (postoperative nausea and vomiting)    Sleep apnea    wears CPAP nightly   Past Surgical History:  Procedure Laterality Date   BREAST REDUCTION SURGERY     Right   CATARACT EXTRACTION W/ INTRAOCULAR LENS  IMPLANT, BILATERAL     CESAREAN SECTION     COLONOSCOPY W/ BIOPSIES AND POLYPECTOMY     CYST EXCISION N/A 08/30/2018   Procedure: excision of posterior shoulder / back sebaceous cyst;  Surgeon: Lowery Estefana RAMAN, DO;  Location: [facility];  Service: Plastics;  Laterality: N/A;   KNEE ARTHROSCOPY Left 02/23/2022   Procedure: LEFT KNEE ARTHROSCOPY;  Surgeon: Sheril Coy, MD;  Location: [facility];  Service: Orthopedics;  Laterality: Left;   KYPHOPLASTY N/A 02/18/2016   Procedure: LUMBAR 1 KYPHOPLASTY;  Surgeon: Oneil Priestly, MD;  Location: [facility];  Service: Orthopedics;  Laterality: N/A;  LUMBAR 1 KYPHOPLASTY   KYPHOPLASTY  N/A 06/04/2021   Procedure: LUMBAR 2 KYPHOPLASTY;  Surgeon: Priestly Oneil, MD;  Location: [facility];  Service: Orthopedics;  Laterality: N/A;   KYPHOPLASTY N/A 03/31/2023   Procedure: LUMBAR 4 KYPHOPLASTY;  Surgeon: Priestly Oneil, MD;  Location: [facility];  Service: Orthopedics;  Laterality: N/A;   LAPAROSCOPIC APPENDECTOMY N/A 12/09/2021   Procedure: APPENDECTOMY LAPAROSCOPIC;  Surgeon: Teresa Lonni HERO, MD;  Location: [facility];  Service: General;  Laterality: N/A;   LUMBAR DISC SURGERY     MODIFIED RADICAL MASTECTOMY W/ AXILLARY LYMPH NODE DISSECTION     Left   RECONSTRUCTION BREAST W/ LATISSIMUS DORSI FLAP     left   REDUCTION MAMMAPLASTY Right    REVERSE SHOULDER ARTHROPLASTY Right 12/15/2023   Procedure: ARTHROPLASTY, SHOULDER, TOTAL, REVERSE;  Surgeon: Dozier Soulier, MD;  Location: [facility];  Service: Orthopedics;  Laterality: Right;   right foot surgery     corrected hammer toe, straightened big toe   SEPTOPLASTY     TOE SURGERY     TONSILLECTOMY AND ADENOIDECTOMY     Patient Active Problem List   Diagnosis Date Noted   S/P reverse total shoulder arthroplasty, right 12/15/2023   Acute  bronchitis with asthma 04/12/2023   Acute appendicitis 12/09/2021   S/P laparoscopic appendectomy 12/09/2021   CAP (community acquired pneumonia) 09/30/2020   Chronic respiratory failure with hypoxia (HCC) 09/23/2020   Allergic rhinitis 08/31/2019   Pilar cyst 08/04/2018   History of breast cancer in female 06/09/2018   History of reconstruction of both breasts 06/09/2018   Severe obesity (BMI >= 40) (HCC) 09/16/2015   Asthma with acute exacerbation 09/15/2015   DOE (dyspnea on exertion) 01/11/2014   Insomnia 05/05/2012   Osteoarthritis    Osteopenia    Collagenous colitis    OSA (obstructive sleep apnea)    Hypothyroidism    Mild persistent asthma    Hypertension    Breast cancer, left breast (HCC)     PCP: Jacques Camie Pepper, PA-C  REFERRING PROVIDER: Dozier Soulier, MD    REFERRING DIAG: S/P reverse total shoulder arthroplasty, right [Z96.611]   THERAPY DIAG:  Right shoulder pain, unspecified chronicity  Muscle weakness (generalized)  Rationale for Evaluation and Treatment: Rehabilitation  ONSET DATE: 12/15/2023 DOS  SUBJECTIVE:                                                                                                                                                                                      SUBJECTIVE STATEMENT: Shoulder sore for 24 hrs following sessions but resolves.  Able to sleep through the night  Hand dominance: Right  PERTINENT HISTORY: Relevant PMHx includes anxiety, L breast cancer, acute HF, Lumbar compression fx, DOE, HTN< OA, oteopenia, hypothyroidisma, sleep apnea, R breast reduction procedure  PAIN:  Are you having pain? Yes: NPRS scale: 0/10 current, 5/10 with ADLs  Pain location: R shoulder Pain description: aching  Aggravating factors: ADLs (getting dressed) Relieving factors: prescribed pain medicine, OTC tylenol   PRECAUTIONS: Shoulder  RED FLAGS: None   WEIGHT BEARING RESTRICTIONS: No  FALLS:  Has patient fallen in last 6 months? No  LIVING ENVIRONMENT: Lives with: lives with their family and lives with their spouse Lives in: House/apartment Stairs: No; besides 2-steps to laundry room  Has following equipment at home: Single point cane, Environmental consultant - 4 wheeled, Wheelchair (manual), shower chair, and Grab bars  OCCUPATION: N/a  PLOF: Independent with basic ADLs, Independent with household mobility with device, and Independent with community mobility with device  PATIENT GOALS: To be able to perform toileting and grooming tasks independently.   NEXT MD VISIT: July 27  OBJECTIVE:  Note: Objective measures were completed at Evaluation unless otherwise noted.  DIAGNOSTIC FINDINGS:  11/23/2023: R shoulder CT   IMPRESSION: 1. Severe osteoarthritis of the right glenohumeral joint. 2. Complete  supraspinatus tendon tear. Mild supraspinatus muscle atrophy. Moderate  infraspinatus muscle atrophy.  PATIENT SURVEYS:  Quick DASH: 75   COGNITION: Overall cognitive status: Within functional limits for tasks assessed  POSTURE: Mildly rounded shoulders and forward head   UPPER EXTREMITY ROM:   Passive ROM Right eval Left eval  Shoulder flexion A 115 P 90  Shoulder extension    Shoulder abduction A 150 P 65  Shoulder adduction    Shoulder internal rotation    Shoulder external rotation    Elbow flexion    Elbow extension    Wrist flexion    Wrist extension    Wrist ulnar deviation    Wrist radial deviation    Wrist pronation    Wrist supination    (Blank rows = not tested)  UPPER EXTREMITY MMT: deferred until appropriate d/t post op status   MMT Right eval Left eval  Shoulder flexion    Shoulder extension    Shoulder abduction    Shoulder adduction    Shoulder internal rotation    Shoulder external rotation    Middle trapezius    Lower trapezius    Elbow flexion    Elbow extension    Wrist flexion    Wrist extension    Wrist ulnar deviation    Wrist radial deviation    Wrist pronation    Wrist supination    Grip strength (lbs) 15 lb avg 30 lb avg  (Blank rows = not tested)                                                                                                                             TREATMENT DATE:  [facility] Adult PT Treatment:                                                DATE: 01/17/24 Therapeutic Exercise: Seated scap squeezes 15x Seated shrugs 15x Supine press with Ranger 15x Supine OH flexion with Ranger 15x Supine ER with UE Ranger 15x OH pulleys 5 min Manual Therapy: PROM abd/ER/FF 80/45/110d respectively Supine rhythmic stabilization at 90d flexion 30s x3 Isometric shoulder flex/ext/abd 3s 10x Pec minor release 2 min Neuromuscular re-ed: Supine elbow flexion 15x w/500g Supine press 15x w/500g ball Seated R abd elbow flexed  15x Seated OH press with tennis ball 15x  [facility] Adult PT Treatment:                                                DATE: 01/11/24 Therapeutic Exercise: Seated scap squeezes 15x Seated shrugs 15x Supine press with Ranger 15x Supine OH flexion with Ranger 15x Manual Therapy: PROM abd/ER/FF Gentle joint mobs into distraction/inferior/posterior directions 5x10 Neuromuscular re-ed: Isometric shoulder flex/ext/abd 3s 10x Supine elbow flexion  15x w/tennis ball Supine press 15x w/tennis ball Seated R abd elbow flexed 15x Seated OH press with tennis ball 15x   [facility] Adult PT Treatment:                                                DATE: 01/09/24 Neuromuscular re-ed: Isometric shoulder flex/ext/abd 3s 10x Supine elbow flexion 15x w/tennis ball Supine press 15x w/tennis ball Seated R abd elbow flexed 15x Seated OH press with tennis ball 15x Therapeutic Activity: Shrugs 10x Scapular retractions 10x Seated bicep curls PROM R shoulder 90d abd, 90d flexion   [facility] Adult PT Treatment:                                                DATE: 01/05/2024   Initial evaluation: see patient education and home exercise program as noted below     PATIENT EDUCATION: Education details: reviewed initial home exercise program; discussion of POC, prognosis and goals for skilled PT   Person educated: Patient Education method: Explanation, Demonstration, and Handouts Education comprehension: verbalized understanding, returned demonstration, and needs further education  HOME EXERCISE PROGRAM: Access Code: [email or web address] URL: [email or web address] Date: 01/05/2024 Prepared by: Marko Molt  Exercises - Seated Bilateral Shoulder Flexion Towel Slide at Table Top  - 2 x daily - 7 x weekly - 2 sets - 10 reps - 3 sec hold - Seated Shoulder Pendulum Exercise  - 2 x daily - 7 x weekly - 1 sets - 5 reps - 10 sec hold - Supine Shoulder Flexion AAROM with Hands Clasped  - 2 x daily - 7 x weekly - 2 sets -  10 reps - 3 sec hold  ASSESSMENT:  CLINICAL IMPRESSION:  Pain and soreness managed at home.  Continued to address ROM and gentle strengthening along protocol guidelines.  Added R pec minor release and rhythmic stabilization for isometric strengthening component.  ROM measurements updated.   [name] is a 83 y.o. female who was seen today for physical therapy evaluation and treatment for R shoulder pain and mobility deficits 3 weeks s/p Right reverse TSA. She is demonstrating diminished R shoulder PROM, decreased postural endurance, and decreased R grip strength. She has related pain and difficulty with ADLs/IADLs, including dressing, grooming, and bed mobility. She requires skilled PT services at this time to address relevant deficits and improve overall independent function.     OBJECTIVE IMPAIRMENTS: decreased activity tolerance, decreased ROM, decreased strength, impaired UE functional use, improper body mechanics, postural dysfunction, and pain.   ACTIVITY LIMITATIONS: carrying, lifting, sleeping, transfers, bed mobility, bathing, toileting, dressing, self feeding, reach over head, and hygiene/grooming  PARTICIPATION LIMITATIONS: meal prep, cleaning, laundry, and community activity  PERSONAL FACTORS: Age, Past/current experiences, and 3+ comorbidities: Relevant PMHx includes anxiety, L breast cancer, acute HF, Lumbar compression fx, DOE, HTN< OA, oteopenia, hypothyroidisma, sleep apnea, R breast reduction procedure are also affecting patient's functional outcome.   REHAB POTENTIAL: Fair    CLINICAL DECISION MAKING: Evolving/moderate complexity  EVALUATION COMPLEXITY: Moderate   GOALS: Goals reviewed with patient? YES  SHORT TERM GOALS: Target date: 02/02/2024   Patient will be independent with initial home program at least 3 days/week.  Baseline: provided at eval Goal Status: INITIAL   2.  Patient will demonstrate improved postural awareness for at least 15 minutes while seated  without need for cueing from PT.  Baseline: see objective measures Goal Status: INITIAL   3.  Patient will demonstrate improved shoulder AROM to at least 90 degrees of flexion and abduction.   Baseline: PROM 90 flexion, 65 abduction at eval  Goal status: INITIAL   LONG TERM GOALS: Target date: 03/01/2024   Patient will report improved overall functional ability with Quick DASH score of 40 or less.  Baseline: 75 Goal Status: INITIAL  2.  Patient will demonstrate improved R grip strength to at least 25lb, average of 3 trials.  Baseline: 15lb, compared to 30lb on left Goal status: INITIAL  3.  Patient will demonstrate improved functional IR AROM/reaching in order to perform toileting tasks independently.   Baseline: unable Goal status: INITIAL  4.  Patient will demonstrate improved functional ER/AROM/reaching in order to perform grooming/hair styling tasks independently.    Baseline: unable  Goal status: INITIAL    PLAN:  PT FREQUENCY: 1-2x/week  PT DURATION: 8 weeks  PLANNED INTERVENTIONS: 02835- PT Re-evaluation, 97750- Physical Performance Testing, 97110-Therapeutic exercises, 97530- Therapeutic activity, [id number]- Neuromuscular re-education, 97535- Self Care, 02859- Manual therapy, [phone]- Aquatic Therapy, [id number]- Vasopneumatic device, Patient/Family education, Cryotherapy, and Moist heat  PLAN FOR NEXT SESSION: address shoulder ROM and otherwise progress as appopriate for post op status   Jeff Chereese [place] PT  01/17/2024 2:05 PM   REVERSE SHOULDER ARTHROPLASTY REHABILITATION GUIDELINES These guidelines do NOT apply to RTSA for proximal humeral fracture - rehabilitation following PHFx will commence 4-6 weeks after surgery as deemed appropriate to protect tuberosity healing PHASE PRECAUTIONS AND GUIDELINES GOALS EXERCISES CRITERIA TO ADVANCE  EXAMINATION  1 (Post-operative day 1 to post-operative week 3)       PHASE PRECAUTIONS AND GUIDELINES GOALS EXERCISES CRITERIA TO ADVANCE   EXAMINATION  2 (Post-operative week 3 to 6) May discontinue sling use at 3 weeks; after 2 weeks can remove the sling at home and just use the sling at night and in community for 3rd week  May use arm for basic activities of daily living (such as feeding, brushing teeth, dressing.)   May submerge in water  after 4 weeks  Avoid combined IR/EXT/ADD (hand behind the back) and IR/ADD (reaching across chest) for dislocation precautions  Avoid acromial or scapular spine pain as increase deltoid loading - decrease load if this occurs  Elevation to 130 deg and ER to 30 deg - passive, active assisted or active  Low (less than 3/10) to no pain  Ability to fire all heads of the deltoid May discontinue grip, and active elbow and wrist exercises since using the arm in ADLs with sling removed around the home  Continue elevation to 130 and ER to 30, both in scapular plane   Submaximal isometrics (pain-free effort) for all functional heads of deltoid (anterior, posterior, middle).   Active exercise as able:  Supine forward punch  Place in balanced position with circumduction and progressive arcs in sagittal plane  Side-lying abduction to 90   Lateral raise with bent elbow   Prone extension to hip  Elevation in scapular plane to 130; ER in scapular plane to 30   Ability to fire isometrically all heads of the deltoid muscle without pain  Ability to place and hold the arm in balanced position (90 deg elevation in supine) Wound assessment  Neurovascular assessment  Swelling assessment  ROM shoulder elevation and ER(0)  Patient reported outcome measure  Pain  level    PHASE PRECAUTIONS AND GUIDELINES GOALS EXERCISES CRITERIA TO ADVANCE  EXAMINATION  3 (Post-operative week 6 to 12) Avoid forceful end-range motion in any direction   Progress active use of the arm in ADLs without being restricted to arm by the side of the body;   No heavy lifting or carrying  Initiate functional IR  behind the back gently without forceful overpressure  Avoid acromial or scapular spine pain as increase deltoid loading - decrease load if this occurs  NO UPPER BODY ERGOMETER  Optimize ROM for elevation and ER in scapular plane   Expected PROM: Elevation - 145-160; ER - 40-50 ; functional IR to L1  Recover AROM to approach as close to PROM available as possible  Establish dynamic stability of the shoulder  Forward elevation in scapular plane active progression: supine to incline to vertical; short to long lever arm  Lateral raise with bent elbow; side-lying abduction  Active ER/IR with arm at side  Scapular retraction with light band resistance  Serratus anterior punches in supine; avoid wall, incline or prone press-ups for serratus anterior  Functional IR with hand slide up back - very gentle and gradual   AROM equals/approaches PROM with good mechanics for elevation  No pain  Higher level demand on shoulder than ADL functions PROM for elevation, ER(0)  AROM for elevation, ER(0) and functional IR  Patient reported outcome measure  Pain             PHASE PRECAUTIONS AND GUIDELINES GOALS EXERCISES CRITERIA TO ADVANCE  EXAMINATION  4 (Post-operative week 12+) No heavy lifting and no overhead sports  Weight lifting limit 25.lb  No heavy pushing activity  Gradually increase strength   NO UPPER BODY ERGOMETER  Optimize functional use of operative UE to patient specific goals  Gradual increase in deltoid, scapular muscle and rotator cuff strength  Pain-free functional activities Light hand weights for deltoid up to and not to exceed 3 lbs for anterior and posterior with long arm lift against gravity; elbow bent to 90 deg for abduction in scapular plane  Band progression for extension to hip with scapular depression/retraction  Band progression for serratus anterior punches in supine; avoid wall, incline or prone press-ups for serratus anterior  End-range  stretching gently without forceful overpressure in all planes (elevation in scapular plane, ER in scapular plane, functional IR) with stretching done for life as part of daily routine  NO UPPER BODY ERGOMETER  Pain-free AROM for shoulder elevation (expect around 135-150 deg)  Functional strength for all ADLs, work tasks, and hobbies approved by Designer, television/film set with home maintenance program PROM for elevation, ER(0); ER(90)  AROM for elevation, ER(0) and functional IR  Scapulohumeral rhythm/biomechanics of active movement strategies  Strength testing for deltoid, RTC, scapular muscles  Patient reported outcome measure  Pain

## 2024-01-17 ENCOUNTER — Ambulatory Visit

## 2024-01-17 ENCOUNTER — Other Ambulatory Visit

## 2024-01-17 ENCOUNTER — Encounter

## 2024-01-17 DIAGNOSIS — M25511 Pain in right shoulder: Secondary | ICD-10-CM

## 2024-01-17 DIAGNOSIS — M6281 Muscle weakness (generalized): Secondary | ICD-10-CM

## 2024-01-17 NOTE — Therapy (Unsigned)
 OUTPATIENT PHYSICAL THERAPY TREATMENT NOTE   Patient Name: [name] MRN: [id number] DOB:1941/04/13, 83 y.o., female Today's Date: 01/19/2024  END OF SESSION:     PT End of Session - 01/19/24 1405     Visit Number 5    Number of Visits 16    Date for PT Re-Evaluation 03/01/24    Authorization Type [employer] M[employer]    Authorization Time Period VL Medical Necessity    Progress Note Due on Visit 10    PT Start Time 1400    PT Stop Time 1445    PT Time Calculation (min) 45 min    Activity Tolerance Patient tolerated treatment well    Behavior During Therapy [facility] for tasks assessed/performed             Past Medical History:  Diagnosis Date   Anemia    PMH;  Patient denies this dx as of 06/03/21   Anxiety    Asthma    Breast cancer (HCC) 1988   left breast cancer   CHF (congestive heart failure) (HCC)    Hx of Acute  HF   Collagenous colitis    Compression fracture    lumbar 1   Dyspnea    with exertion   Heart murmur    Hypertension    Hypothyroidism    Osteoarthritis    Osteopenia    Pneumonia 07/2020   PONV (postoperative nausea and vomiting)    Sleep apnea    wears CPAP nightly   Past Surgical History:  Procedure Laterality Date   BREAST REDUCTION SURGERY     Right   CATARACT EXTRACTION W/ INTRAOCULAR LENS  IMPLANT, BILATERAL     CESAREAN SECTION     COLONOSCOPY W/ BIOPSIES AND POLYPECTOMY     CYST EXCISION N/A 08/30/2018   Procedure: excision of posterior shoulder / back sebaceous cyst;  Surgeon: Lowery Estefana RAMAN, DO;  Location: [facility];  Service: Plastics;  Laterality: N/A;   KNEE ARTHROSCOPY Left 02/23/2022   Procedure: LEFT KNEE ARTHROSCOPY;  Surgeon: Sheril Coy, MD;  Location: [facility];  Service: Orthopedics;  Laterality: Left;   KYPHOPLASTY N/A 02/18/2016   Procedure: LUMBAR 1 KYPHOPLASTY;  Surgeon: Oneil Priestly, MD;  Location: [facility];  Service: Orthopedics;  Laterality: N/A;  LUMBAR 1 KYPHOPLASTY   KYPHOPLASTY  N/A 06/04/2021   Procedure: LUMBAR 2 KYPHOPLASTY;  Surgeon: Priestly Oneil, MD;  Location: [facility];  Service: Orthopedics;  Laterality: N/A;   KYPHOPLASTY N/A 03/31/2023   Procedure: LUMBAR 4 KYPHOPLASTY;  Surgeon: Priestly Oneil, MD;  Location: [facility];  Service: Orthopedics;  Laterality: N/A;   LAPAROSCOPIC APPENDECTOMY N/A 12/09/2021   Procedure: APPENDECTOMY LAPAROSCOPIC;  Surgeon: Teresa Lonni HERO, MD;  Location: [facility];  Service: General;  Laterality: N/A;   LUMBAR DISC SURGERY     MODIFIED RADICAL MASTECTOMY W/ AXILLARY LYMPH NODE DISSECTION     Left   RECONSTRUCTION BREAST W/ LATISSIMUS DORSI FLAP     left   REDUCTION MAMMAPLASTY Right    REVERSE SHOULDER ARTHROPLASTY Right 12/15/2023   Procedure: ARTHROPLASTY, SHOULDER, TOTAL, REVERSE;  Surgeon: Dozier Soulier, MD;  Location: [facility];  Service: Orthopedics;  Laterality: Right;   right foot surgery     corrected hammer toe, straightened big toe   SEPTOPLASTY     TOE SURGERY     TONSILLECTOMY AND ADENOIDECTOMY     Patient Active Problem List   Diagnosis Date Noted   S/P reverse total shoulder arthroplasty, right 12/15/2023  Acute bronchitis with asthma 04/12/2023   Acute appendicitis 12/09/2021   S/P laparoscopic appendectomy 12/09/2021   CAP (community acquired pneumonia) 09/30/2020   Chronic respiratory failure with hypoxia (HCC) 09/23/2020   Allergic rhinitis 08/31/2019   Pilar cyst 08/04/2018   History of breast cancer in female 06/09/2018   History of reconstruction of both breasts 06/09/2018   Severe obesity (BMI >= 40) (HCC) 09/16/2015   Asthma with acute exacerbation 09/15/2015   DOE (dyspnea on exertion) 01/11/2014   Insomnia 05/05/2012   Osteoarthritis    Osteopenia    Collagenous colitis    OSA (obstructive sleep apnea)    Hypothyroidism    Mild persistent asthma    Hypertension    Breast cancer, left breast (HCC)     PCP: Jacques Camie Pepper, PA-C  REFERRING PROVIDER: Dozier Soulier, MD    REFERRING DIAG: S/P reverse total shoulder arthroplasty, right [S03.388]   THERAPY DIAG:  Right shoulder pain, unspecified chronicity  Muscle weakness (generalized)  Abnormal posture  Rationale for Evaluation and Treatment: Rehabilitation  ONSET DATE: 12/15/2023 DOS  SUBJECTIVE:                                                                                                                                                                                      SUBJECTIVE STATEMENT: No new issues to report.  Pain and soreness stabilized  Hand dominance: Right  PERTINENT HISTORY: Relevant PMHx includes anxiety, L breast cancer, acute HF, Lumbar compression fx, DOE, HTN< OA, oteopenia, hypothyroidisma, sleep apnea, R breast reduction procedure  PAIN:  Are you having pain? Yes: NPRS scale: 0/10 current, 5/10 with ADLs  Pain location: R shoulder Pain description: aching  Aggravating factors: ADLs (getting dressed) Relieving factors: prescribed pain medicine, OTC tylenol   PRECAUTIONS: Shoulder  RED FLAGS: None   WEIGHT BEARING RESTRICTIONS: No  FALLS:  Has patient fallen in last 6 months? No  LIVING ENVIRONMENT: Lives with: lives with their family and lives with their spouse Lives in: House/apartment Stairs: No; besides 2-steps to laundry room  Has following equipment at home: Single point cane, Environmental consultant - 4 wheeled, Wheelchair (manual), shower chair, and Grab bars  OCCUPATION: N/a  PLOF: Independent with basic ADLs, Independent with household mobility with device, and Independent with community mobility with device  PATIENT GOALS: To be able to perform toileting and grooming tasks independently.   NEXT MD VISIT: July 27  OBJECTIVE:  Note: Objective measures were completed at Evaluation unless otherwise noted.  DIAGNOSTIC FINDINGS:  11/23/2023: R shoulder CT   IMPRESSION: 1. Severe osteoarthritis of the right glenohumeral joint. 2. Complete supraspinatus tendon  tear. Mild supraspinatus muscle atrophy. Moderate infraspinatus muscle  atrophy.  PATIENT SURVEYS:  Quick DASH: 75   COGNITION: Overall cognitive status: Within functional limits for tasks assessed  POSTURE: Mildly rounded shoulders and forward head   UPPER EXTREMITY ROM:   Passive ROM Right eval Left eval  Shoulder flexion A 115 P 90  Shoulder extension    Shoulder abduction A 150 P 65  Shoulder adduction    Shoulder internal rotation    Shoulder external rotation    Elbow flexion    Elbow extension    Wrist flexion    Wrist extension    Wrist ulnar deviation    Wrist radial deviation    Wrist pronation    Wrist supination    (Blank rows = not tested)  UPPER EXTREMITY MMT: deferred until appropriate d/t post op status   MMT Right eval Left eval  Shoulder flexion    Shoulder extension    Shoulder abduction    Shoulder adduction    Shoulder internal rotation    Shoulder external rotation    Middle trapezius    Lower trapezius    Elbow flexion    Elbow extension    Wrist flexion    Wrist extension    Wrist ulnar deviation    Wrist radial deviation    Wrist pronation    Wrist supination    Grip strength (lbs) 15 lb avg 30 lb avg  (Blank rows = not tested)                                                                                                                             TREATMENT DATE:  [facility] Adult PT Treatment:                                                DATE: 01/19/24 Therapeutic Exercise: Seated scap squeezes 15x Seated shrugs 15x Seated isometric ER R YTB 15x Supine press with Ranger 15x2 Supine OH flexion with Ranger 15x2 Supine ER with UE Ranger 15x2 OH pulleys 5 min Seated elbow flexion 500g ball 15x2 Neuromuscular re-ed: Supine elbow flexion 15x2 w/500g Supine press 15x2 w/500g ball Seated R abd elbow flexed 15x Seated OH press with 500g ball 15x  [facility] Adult PT Treatment:                                                DATE:  01/17/24 Therapeutic Exercise: Seated scap squeezes 15x Seated shrugs 15x Supine press with Ranger 15x Supine OH flexion with Ranger 15x Supine ER with UE Ranger 15x OH pulleys 5 min Manual Therapy: PROM abd/ER/FF 80/45/110d respectively Supine rhythmic stabilization at 90d flexion 30s x3 Isometric shoulder flex/ext/abd 3s 10x Pec minor release 2 min Neuromuscular  re-ed: Supine elbow flexion 15x w/500g Supine press 15x w/500g ball Seated R abd elbow flexed 15x Seated OH press with tennis ball 15x  [facility] Adult PT Treatment:                                                DATE: 01/11/24 Therapeutic Exercise: Seated scap squeezes 15x Seated shrugs 15x Supine press with Ranger 15x Supine OH flexion with Ranger 15x Manual Therapy: PROM abd/ER/FF Gentle joint mobs into distraction/inferior/posterior directions 5x10 Neuromuscular re-ed: Isometric shoulder flex/ext/abd 3s 10x Supine elbow flexion 15x w/tennis ball Supine press 15x w/tennis ball Seated R abd elbow flexed 15x Seated OH press with tennis ball 15x     [facility] Adult PT Treatment:                                                DATE: 01/05/2024   Initial evaluation: see patient education and home exercise program as noted below     PATIENT EDUCATION: Education details: reviewed initial home exercise program; discussion of POC, prognosis and goals for skilled PT   Person educated: Patient Education method: Explanation, Demonstration, and Handouts Education comprehension: verbalized understanding, returned demonstration, and needs further education  HOME EXERCISE PROGRAM: Access Code: [email or web address] URL: [email or web address] Date: 01/05/2024 Prepared by: Marko Molt  Exercises - Seated Bilateral Shoulder Flexion Towel Slide at Table Top  - 2 x daily - 7 x weekly - 2 sets - 10 reps - 3 sec hold - Seated Shoulder Pendulum Exercise  - 2 x daily - 7 x weekly - 1 sets - 5 reps - 10 sec hold - Supine Shoulder  Flexion AAROM with Hands Clasped  - 2 x daily - 7 x weekly - 2 sets - 10 reps - 3 sec hold  ASSESSMENT:  CLINICAL IMPRESSION:  Increased reps as noted to challenge patient and encourage more AROM.  Added isometric ER against YTB   Emy is a 83 y.o. female who was seen today for physical therapy evaluation and treatment for R shoulder pain and mobility deficits 3 weeks s/p Right reverse TSA. She is demonstrating diminished R shoulder PROM, decreased postural endurance, and decreased R grip strength. She has related pain and difficulty with ADLs/IADLs, including dressing, grooming, and bed mobility. She requires skilled PT services at this time to address relevant deficits and improve overall independent function.     OBJECTIVE IMPAIRMENTS: decreased activity tolerance, decreased ROM, decreased strength, impaired UE functional use, improper body mechanics, postural dysfunction, and pain.   ACTIVITY LIMITATIONS: carrying, lifting, sleeping, transfers, bed mobility, bathing, toileting, dressing, self feeding, reach over head, and hygiene/grooming  PARTICIPATION LIMITATIONS: meal prep, cleaning, laundry, and community activity  PERSONAL FACTORS: Age, Past/current experiences, and 3+ comorbidities: Relevant PMHx includes anxiety, L breast cancer, acute HF, Lumbar compression fx, DOE, HTN< OA, oteopenia, hypothyroidisma, sleep apnea, R breast reduction procedure are also affecting patient's functional outcome.   REHAB POTENTIAL: Fair    CLINICAL DECISION MAKING: Evolving/moderate complexity  EVALUATION COMPLEXITY: Moderate   GOALS: Goals reviewed with patient? YES  SHORT TERM GOALS: Target date: 02/02/2024   Patient will be independent with initial home program at least 3 days/week.  Baseline: provided at eval Goal Status: INITIAL  2.  Patient will demonstrate improved postural awareness for at least 15 minutes while seated without need for cueing from PT.  Baseline: see objective  measures Goal Status: INITIAL   3.  Patient will demonstrate improved shoulder AROM to at least 90 degrees of flexion and abduction.   Baseline: PROM 90 flexion, 65 abduction at eval  Goal status: INITIAL   LONG TERM GOALS: Target date: 03/01/2024   Patient will report improved overall functional ability with Quick DASH score of 40 or less.  Baseline: 75 Goal Status: INITIAL  2.  Patient will demonstrate improved R grip strength to at least 25lb, average of 3 trials.  Baseline: 15lb, compared to 30lb on left Goal status: INITIAL  3.  Patient will demonstrate improved functional IR AROM/reaching in order to perform toileting tasks independently.   Baseline: unable Goal status: INITIAL  4.  Patient will demonstrate improved functional ER/AROM/reaching in order to perform grooming/hair styling tasks independently.    Baseline: unable  Goal status: INITIAL    PLAN:  PT FREQUENCY: 1-2x/week  PT DURATION: 8 weeks  PLANNED INTERVENTIONS: 02835- PT Re-evaluation, 97750- Physical Performance Testing, 97110-Therapeutic exercises, 97530- Therapeutic activity, [id number]- Neuromuscular re-education, 97535- Self Care, 02859- Manual therapy, [phone]- Aquatic Therapy, [id number]- Vasopneumatic device, Patient/Family education, Cryotherapy, and Moist heat  PLAN FOR NEXT SESSION: address shoulder ROM and otherwise progress as appopriate for post op status   Jeff Brayant Dorr PT  01/19/2024 2:54 PM   REVERSE SHOULDER ARTHROPLASTY REHABILITATION GUIDELINES These guidelines do NOT apply to RTSA for proximal humeral fracture - rehabilitation following PHFx will commence 4-6 weeks after surgery as deemed appropriate to protect tuberosity healing PHASE PRECAUTIONS AND GUIDELINES GOALS EXERCISES CRITERIA TO ADVANCE  EXAMINATION  1 (Post-operative day 1 to post-operative week 3)       PHASE PRECAUTIONS AND GUIDELINES GOALS EXERCISES CRITERIA TO ADVANCE  EXAMINATION  2 (Post-operative week 3 to 6) May  discontinue sling use at 3 weeks; after 2 weeks can remove the sling at home and just use the sling at night and in community for 3rd week  May use arm for basic activities of daily living (such as feeding, brushing teeth, dressing.)   May submerge in water  after 4 weeks  Avoid combined IR/EXT/ADD (hand behind the back) and IR/ADD (reaching across chest) for dislocation precautions  Avoid acromial or scapular spine pain as increase deltoid loading - decrease load if this occurs  Elevation to 130 deg and ER to 30 deg - passive, active assisted or active  Low (less than 3/10) to no pain  Ability to fire all heads of the deltoid May discontinue grip, and active elbow and wrist exercises since using the arm in ADLs with sling removed around the home  Continue elevation to 130 and ER to 30, both in scapular plane   Submaximal isometrics (pain-free effort) for all functional heads of deltoid (anterior, posterior, middle).   Active exercise as able:  Supine forward punch  Place in balanced position with circumduction and progressive arcs in sagittal plane  Side-lying abduction to 90   Lateral raise with bent elbow   Prone extension to hip  Elevation in scapular plane to 130; ER in scapular plane to 30   Ability to fire isometrically all heads of the deltoid muscle without pain  Ability to place and hold the arm in balanced position (90 deg elevation in supine) Wound assessment  Neurovascular assessment  Swelling assessment  ROM shoulder elevation and ER(0)  Patient reported outcome measure  Pain level    PHASE PRECAUTIONS AND GUIDELINES GOALS EXERCISES CRITERIA TO ADVANCE  EXAMINATION  3 (Post-operative week 6 to 12) Avoid forceful end-range motion in any direction   Progress active use of the arm in ADLs without being restricted to arm by the side of the body;   No heavy lifting or carrying  Initiate functional IR behind the back gently without forceful  overpressure  Avoid acromial or scapular spine pain as increase deltoid loading - decrease load if this occurs  NO UPPER BODY ERGOMETER  Optimize ROM for elevation and ER in scapular plane   Expected PROM: Elevation - 145-160; ER - 40-50 ; functional IR to L1  Recover AROM to approach as close to PROM available as possible  Establish dynamic stability of the shoulder  Forward elevation in scapular plane active progression: supine to incline to vertical; short to long lever arm  Lateral raise with bent elbow; side-lying abduction  Active ER/IR with arm at side  Scapular retraction with light band resistance  Serratus anterior punches in supine; avoid wall, incline or prone press-ups for serratus anterior  Functional IR with hand slide up back - very gentle and gradual   AROM equals/approaches PROM with good mechanics for elevation  No pain  Higher level demand on shoulder than ADL functions PROM for elevation, ER(0)  AROM for elevation, ER(0) and functional IR  Patient reported outcome measure  Pain             PHASE PRECAUTIONS AND GUIDELINES GOALS EXERCISES CRITERIA TO ADVANCE  EXAMINATION  4 (Post-operative week 12+) No heavy lifting and no overhead sports  Weight lifting limit 25.lb  No heavy pushing activity  Gradually increase strength   NO UPPER BODY ERGOMETER  Optimize functional use of operative UE to patient specific goals  Gradual increase in deltoid, scapular muscle and rotator cuff strength  Pain-free functional activities Light hand weights for deltoid up to and not to exceed 3 lbs for anterior and posterior with long arm lift against gravity; elbow bent to 90 deg for abduction in scapular plane  Band progression for extension to hip with scapular depression/retraction  Band progression for serratus anterior punches in supine; avoid wall, incline or prone press-ups for serratus anterior  End-range stretching gently without forceful overpressure  in all planes (elevation in scapular plane, ER in scapular plane, functional IR) with stretching done for life as part of daily routine  NO UPPER BODY ERGOMETER  Pain-free AROM for shoulder elevation (expect around 135-150 deg)  Functional strength for all ADLs, work tasks, and hobbies approved by Designer, television/film set with home maintenance program PROM for elevation, ER(0); ER(90)  AROM for elevation, ER(0) and functional IR  Scapulohumeral rhythm/biomechanics of active movement strategies  Strength testing for deltoid, RTC, scapular muscles  Patient reported outcome measure  Pain

## 2024-01-19 ENCOUNTER — Ambulatory Visit

## 2024-01-19 DIAGNOSIS — M25511 Pain in right shoulder: Secondary | ICD-10-CM | POA: Diagnosis not present

## 2024-01-19 DIAGNOSIS — R293 Abnormal posture: Secondary | ICD-10-CM

## 2024-01-19 DIAGNOSIS — M6281 Muscle weakness (generalized): Secondary | ICD-10-CM

## 2024-01-23 NOTE — Therapy (Signed)
 OUTPATIENT PHYSICAL THERAPY TREATMENT NOTE   Patient Name: [name] MRN: [id number] DOB:October 14, 1940, 83 y.o., female Today's Date: 01/24/2024  END OF SESSION:     PT End of Session - 01/24/24 1505     Visit Number 6    Number of Visits 16    Date for PT Re-Evaluation 03/01/24    Authorization Type [employer] MCR/[employer]    Authorization Time Period VL Medical Necessity    PT Start Time 1315    PT Stop Time 1400    PT Time Calculation (min) 45 min    Activity Tolerance Patient tolerated treatment well    Behavior During Therapy [facility] for tasks assessed/performed              Past Medical History:  Diagnosis Date   Anemia    PMH;  Patient denies this dx as of 06/03/21   Anxiety    Asthma    Breast cancer (HCC) 1988   left breast cancer   CHF (congestive heart failure) (HCC)    Hx of Acute  HF   Collagenous colitis    Compression fracture    lumbar 1   Dyspnea    with exertion   Heart murmur    Hypertension    Hypothyroidism    Osteoarthritis    Osteopenia    Pneumonia 07/2020   PONV (postoperative nausea and vomiting)    Sleep apnea    wears CPAP nightly   Past Surgical History:  Procedure Laterality Date   BREAST REDUCTION SURGERY     Right   CATARACT EXTRACTION W/ INTRAOCULAR LENS  IMPLANT, BILATERAL     CESAREAN SECTION     COLONOSCOPY W/ BIOPSIES AND POLYPECTOMY     CYST EXCISION N/A 08/30/2018   Procedure: excision of posterior shoulder / back sebaceous cyst;  Surgeon: Lowery Estefana RAMAN, DO;  Location: [facility];  Service: Plastics;  Laterality: N/A;   KNEE ARTHROSCOPY Left 02/23/2022   Procedure: LEFT KNEE ARTHROSCOPY;  Surgeon: Sheril Coy, MD;  Location: [facility];  Service: Orthopedics;  Laterality: Left;   KYPHOPLASTY N/A 02/18/2016   Procedure: LUMBAR 1 KYPHOPLASTY;  Surgeon: Oneil Priestly, MD;  Location: [facility];  Service: Orthopedics;  Laterality: N/A;  LUMBAR 1 KYPHOPLASTY   KYPHOPLASTY N/A 06/04/2021   Procedure:  LUMBAR 2 KYPHOPLASTY;  Surgeon: Priestly Oneil, MD;  Location: [facility];  Service: Orthopedics;  Laterality: N/A;   KYPHOPLASTY N/A 03/31/2023   Procedure: LUMBAR 4 KYPHOPLASTY;  Surgeon: Priestly Oneil, MD;  Location: [facility];  Service: Orthopedics;  Laterality: N/A;   LAPAROSCOPIC APPENDECTOMY N/A 12/09/2021   Procedure: APPENDECTOMY LAPAROSCOPIC;  Surgeon: Teresa Lonni HERO, MD;  Location: [facility];  Service: General;  Laterality: N/A;   LUMBAR DISC SURGERY     MODIFIED RADICAL MASTECTOMY W/ AXILLARY LYMPH NODE DISSECTION     Left   RECONSTRUCTION BREAST W/ LATISSIMUS DORSI FLAP     left   REDUCTION MAMMAPLASTY Right    REVERSE SHOULDER ARTHROPLASTY Right 12/15/2023   Procedure: ARTHROPLASTY, SHOULDER, TOTAL, REVERSE;  Surgeon: Dozier Soulier, MD;  Location: [facility];  Service: Orthopedics;  Laterality: Right;   right foot surgery     corrected hammer toe, straightened big toe   SEPTOPLASTY     TOE SURGERY     TONSILLECTOMY AND ADENOIDECTOMY     Patient Active Problem List   Diagnosis Date Noted   S/P reverse total shoulder arthroplasty, right 12/15/2023   Acute bronchitis with asthma 04/12/2023   Acute  appendicitis 12/09/2021   S/P laparoscopic appendectomy 12/09/2021   CAP (community acquired pneumonia) 09/30/2020   Chronic respiratory failure with hypoxia (HCC) 09/23/2020   Allergic rhinitis 08/31/2019   Pilar cyst 08/04/2018   History of breast cancer in female 06/09/2018   History of reconstruction of both breasts 06/09/2018   Severe obesity (BMI >= 40) (HCC) 09/16/2015   Asthma with acute exacerbation 09/15/2015   DOE (dyspnea on exertion) 01/11/2014   Insomnia 05/05/2012   Osteoarthritis    Osteopenia    Collagenous colitis    OSA (obstructive sleep apnea)    Hypothyroidism    Mild persistent asthma    Hypertension    Breast cancer, left breast (HCC)     PCP: Jacques Camie Pepper, PA-C  REFERRING PROVIDER: Dozier Soulier, MD   REFERRING DIAG: S/P reverse total  shoulder arthroplasty, right [Z96.611]   THERAPY DIAG:  Right shoulder pain, unspecified chronicity  Muscle weakness (generalized)  Abnormal posture  Rationale for Evaluation and Treatment: Rehabilitation  ONSET DATE: 12/15/2023 DOS  SUBJECTIVE:                                                                                                                                                                                      SUBJECTIVE STATEMENT: Arrives to session with a slight increase in soreness as she has been more active in the kitchen.  Hand dominance: Right  PERTINENT HISTORY: Relevant PMHx includes anxiety, L breast cancer, acute HF, Lumbar compression fx, DOE, HTN< OA, oteopenia, hypothyroidisma, sleep apnea, R breast reduction procedure  PAIN:  Are you having pain? Yes: NPRS scale: 0/10 current, 5/10 with ADLs  Pain location: R shoulder Pain description: aching  Aggravating factors: ADLs (getting dressed) Relieving factors: prescribed pain medicine, OTC tylenol   PRECAUTIONS: Shoulder  RED FLAGS: None   WEIGHT BEARING RESTRICTIONS: No  FALLS:  Has patient fallen in last 6 months? No  LIVING ENVIRONMENT: Lives with: lives with their family and lives with their spouse Lives in: House/apartment Stairs: No; besides 2-steps to laundry room  Has following equipment at home: Single point cane, Environmental consultant - 4 wheeled, Wheelchair (manual), shower chair, and Grab bars  OCCUPATION: N/a  PLOF: Independent with basic ADLs, Independent with household mobility with device, and Independent with community mobility with device  PATIENT GOALS: To be able to perform toileting and grooming tasks independently.   NEXT MD VISIT: July 28  OBJECTIVE:  Note: Objective measures were completed at Evaluation unless otherwise noted.  DIAGNOSTIC FINDINGS:  11/23/2023: R shoulder CT   IMPRESSION: 1. Severe osteoarthritis of the right glenohumeral joint. 2. Complete supraspinatus  tendon tear. Mild supraspinatus muscle atrophy. Moderate infraspinatus muscle  atrophy.  PATIENT SURVEYS:  Quick DASH: 75   COGNITION: Overall cognitive status: Within functional limits for tasks assessed  POSTURE: Mildly rounded shoulders and forward head   UPPER EXTREMITY ROM:   Passive ROM Right eval Left eval  Shoulder flexion A 115 P 90  Shoulder extension    Shoulder abduction A 150 P 65  Shoulder adduction    Shoulder internal rotation    Shoulder external rotation    Elbow flexion    Elbow extension    Wrist flexion    Wrist extension    Wrist ulnar deviation    Wrist radial deviation    Wrist pronation    Wrist supination    (Blank rows = not tested)  UPPER EXTREMITY MMT: deferred until appropriate d/t post op status   MMT Right eval Left eval  Shoulder flexion    Shoulder extension    Shoulder abduction    Shoulder adduction    Shoulder internal rotation    Shoulder external rotation    Middle trapezius    Lower trapezius    Elbow flexion    Elbow extension    Wrist flexion    Wrist extension    Wrist ulnar deviation    Wrist radial deviation    Wrist pronation    Wrist supination    Grip strength (lbs) 15 lb avg 30 lb avg  (Blank rows = not tested)                                                                                                                             TREATMENT DATE:  [facility] Adult PT Treatment:                                                DATE: 01/24/24 Therapeutic Exercise: OH pulley 5 min Seated shrugs 15x Seated retractions 15x Manual Therapy: PROM 40d ER, 90d flexion, 80d abd Rhythmic stab at 90d flexion Neuromuscular re-ed: OH press 500g ball 15x2 (90d) Seated ER YTB 15x Seated abd elbow flexed 15x2   [facility] Adult PT Treatment:                                                DATE: 01/19/24 Therapeutic Exercise: Seated scap squeezes 15x Seated shrugs 15x Seated isometric ER R YTB 15x Supine press with Ranger  15x2 Supine OH flexion with Ranger 15x2 Supine ER with UE Ranger 15x2 OH pulleys 5 min Seated elbow flexion 500g ball 15x2 Neuromuscular re-ed: Supine elbow flexion 15x2 w/500g Supine press 15x2 w/500g ball Seated R abd elbow flexed 15x Seated OH press with 500g ball 15x  [facility] Adult PT Treatment:  DATE: 01/17/24 Therapeutic Exercise: Seated scap squeezes 15x Seated shrugs 15x Supine press with Ranger 15x Supine OH flexion with Ranger 15x Supine ER with UE Ranger 15x OH pulleys 5 min Manual Therapy: PROM abd/ER/FF 80/45/110d respectively Supine rhythmic stabilization at 90d flexion 30s x3 Isometric shoulder flex/ext/abd 3s 10x Pec minor release 2 min Neuromuscular re-ed: Supine elbow flexion 15x w/500g Supine press 15x w/500g ball Seated R abd elbow flexed 15x Seated OH press with tennis ball 15x  [facility] Adult PT Treatment:                                                DATE: 01/11/24 Therapeutic Exercise: Seated scap squeezes 15x Seated shrugs 15x Supine press with Ranger 15x Supine OH flexion with Ranger 15x Manual Therapy: PROM abd/ER/FF Gentle joint mobs into distraction/inferior/posterior directions 5x10 Neuromuscular re-ed: Isometric shoulder flex/ext/abd 3s 10x Supine elbow flexion 15x w/tennis ball Supine press 15x w/tennis ball Seated R abd elbow flexed 15x Seated OH press with tennis ball 15x     [facility] Adult PT Treatment:                                                DATE: 01/05/2024   Initial evaluation: see patient education and home exercise program as noted below     PATIENT EDUCATION: Education details: reviewed initial home exercise program; discussion of POC, prognosis and goals for skilled PT   Person educated: Patient Education method: Explanation, Demonstration, and Handouts Education comprehension: verbalized understanding, returned demonstration, and needs further education  HOME EXERCISE  PROGRAM: Access Code: [email or web address] URL: [email or web address] Date: 01/05/2024 Prepared by: Marko Molt  Exercises - Seated Bilateral Shoulder Flexion Towel Slide at Table Top  - 2 x daily - 7 x weekly - 2 sets - 10 reps - 3 sec hold - Seated Shoulder Pendulum Exercise  - 2 x daily - 7 x weekly - 1 sets - 5 reps - 10 sec hold - Supine Shoulder Flexion AAROM with Hands Clasped  - 2 x daily - 7 x weekly - 2 sets - 10 reps - 3 sec hold  ASSESSMENT:  CLINICAL IMPRESSION:  Continues with pain limiting ability to flex and abduct >90d.  Strength in available ROM improving and recognized by patient.   Rupinder is a 83 y.o. female who was seen today for physical therapy evaluation and treatment for R shoulder pain and mobility deficits 3 weeks s/p Right reverse TSA. She is demonstrating diminished R shoulder PROM, decreased postural endurance, and decreased R grip strength. She has related pain and difficulty with ADLs/IADLs, including dressing, grooming, and bed mobility. She requires skilled PT services at this time to address relevant deficits and improve overall independent function.     OBJECTIVE IMPAIRMENTS: decreased activity tolerance, decreased ROM, decreased strength, impaired UE functional use, improper body mechanics, postural dysfunction, and pain.   ACTIVITY LIMITATIONS: carrying, lifting, sleeping, transfers, bed mobility, bathing, toileting, dressing, self feeding, reach over head, and hygiene/grooming  PARTICIPATION LIMITATIONS: meal prep, cleaning, laundry, and community activity  PERSONAL FACTORS: Age, Past/current experiences, and 3+ comorbidities: Relevant PMHx includes anxiety, L breast cancer, acute HF, Lumbar compression fx, DOE, HTN< OA, oteopenia, hypothyroidisma, sleep apnea, R breast reduction procedure are also  affecting patient's functional outcome.   REHAB POTENTIAL: Fair    CLINICAL DECISION MAKING: Evolving/moderate complexity  EVALUATION COMPLEXITY:  Moderate   GOALS: Goals reviewed with patient? YES  SHORT TERM GOALS: Target date: 02/02/2024   Patient will be independent with initial home program at least 3 days/week.  Baseline: provided at eval Goal Status: INITIAL   2.  Patient will demonstrate improved postural awareness for at least 15 minutes while seated without need for cueing from PT.  Baseline: see objective measures Goal Status: INITIAL   3.  Patient will demonstrate improved shoulder AROM to at least 90 degrees of flexion and abduction.   Baseline: PROM 90 flexion, 65 abduction at eval  Goal status: INITIAL   LONG TERM GOALS: Target date: 03/01/2024   Patient will report improved overall functional ability with Quick DASH score of 40 or less.  Baseline: 75 Goal Status: INITIAL  2.  Patient will demonstrate improved R grip strength to at least 25lb, average of 3 trials.  Baseline: 15lb, compared to 30lb on left Goal status: INITIAL  3.  Patient will demonstrate improved functional IR AROM/reaching in order to perform toileting tasks independently.   Baseline: unable Goal status: INITIAL  4.  Patient will demonstrate improved functional ER/AROM/reaching in order to perform grooming/hair styling tasks independently.    Baseline: unable  Goal status: INITIAL    PLAN:  PT FREQUENCY: 1-2x/week  PT DURATION: 8 weeks  PLANNED INTERVENTIONS: 02835- PT Re-evaluation, 97750- Physical Performance Testing, 97110-Therapeutic exercises, 97530- Therapeutic activity, 97112- Neuromuscular re-education, 97535- Self Care, 02859- Manual therapy, [phone]- Aquatic Therapy, 97016- Vasopneumatic device, Patient/Family education, Cryotherapy, and Moist heat  PLAN FOR NEXT SESSION: address shoulder ROM and otherwise progress as appopriate for post op status   Jeff Yulanda Diggs PT  01/24/2024 3:13 PM   REVERSE SHOULDER ARTHROPLASTY REHABILITATION GUIDELINES These guidelines do NOT apply to RTSA for proximal humeral fracture -  rehabilitation following PHFx will commence 4-6 weeks after surgery as deemed appropriate to protect tuberosity healing PHASE PRECAUTIONS AND GUIDELINES GOALS EXERCISES CRITERIA TO ADVANCE  EXAMINATION  1 (Post-operative day 1 to post-operative week 3)       PHASE PRECAUTIONS AND GUIDELINES GOALS EXERCISES CRITERIA TO ADVANCE  EXAMINATION  2 (Post-operative week 3 to 6) May discontinue sling use at 3 weeks; after 2 weeks can remove the sling at home and just use the sling at night and in community for 3rd week  May use arm for basic activities of daily living (such as feeding, brushing teeth, dressing.)   May submerge in water  after 4 weeks  Avoid combined IR/EXT/ADD (hand behind the back) and IR/ADD (reaching across chest) for dislocation precautions  Avoid acromial or scapular spine pain as increase deltoid loading - decrease load if this occurs  Elevation to 130 deg and ER to 30 deg - passive, active assisted or active  Low (less than 3/10) to no pain  Ability to fire all heads of the deltoid May discontinue grip, and active elbow and wrist exercises since using the arm in ADLs with sling removed around the home  Continue elevation to 130 and ER to 30, both in scapular plane   Submaximal isometrics (pain-free effort) for all functional heads of deltoid (anterior, posterior, middle).   Active exercise as able:  Supine forward punch  Place in balanced position with circumduction and progressive arcs in sagittal plane  Side-lying abduction to 90   Lateral raise with bent elbow   Prone extension to hip  Elevation  in scapular plane to 130; ER in scapular plane to 30   Ability to fire isometrically all heads of the deltoid muscle without pain  Ability to place and hold the arm in balanced position (90 deg elevation in supine) Wound assessment  Neurovascular assessment  Swelling assessment  ROM shoulder elevation and ER(0)  Patient reported outcome measure  Pain level     PHASE PRECAUTIONS AND GUIDELINES GOALS EXERCISES CRITERIA TO ADVANCE  EXAMINATION  3 (Post-operative week 6 to 12) Avoid forceful end-range motion in any direction   Progress active use of the arm in ADLs without being restricted to arm by the side of the body;   No heavy lifting or carrying  Initiate functional IR behind the back gently without forceful overpressure  Avoid acromial or scapular spine pain as increase deltoid loading - decrease load if this occurs  NO UPPER BODY ERGOMETER  Optimize ROM for elevation and ER in scapular plane   Expected PROM: Elevation - 145-160; ER - 40-50 ; functional IR to L1  Recover AROM to approach as close to PROM available as possible  Establish dynamic stability of the shoulder  Forward elevation in scapular plane active progression: supine to incline to vertical; short to long lever arm  Lateral raise with bent elbow; side-lying abduction  Active ER/IR with arm at side  Scapular retraction with light band resistance  Serratus anterior punches in supine; avoid wall, incline or prone press-ups for serratus anterior  Functional IR with hand slide up back - very gentle and gradual   AROM equals/approaches PROM with good mechanics for elevation  No pain  Higher level demand on shoulder than ADL functions PROM for elevation, ER(0)  AROM for elevation, ER(0) and functional IR  Patient reported outcome measure  Pain             PHASE PRECAUTIONS AND GUIDELINES GOALS EXERCISES CRITERIA TO ADVANCE  EXAMINATION  4 (Post-operative week 12+) No heavy lifting and no overhead sports  Weight lifting limit 25.lb  No heavy pushing activity  Gradually increase strength   NO UPPER BODY ERGOMETER  Optimize functional use of operative UE to patient specific goals  Gradual increase in deltoid, scapular muscle and rotator cuff strength  Pain-free functional activities Light hand weights for deltoid up to and not to exceed 3 lbs for  anterior and posterior with long arm lift against gravity; elbow bent to 90 deg for abduction in scapular plane  Band progression for extension to hip with scapular depression/retraction  Band progression for serratus anterior punches in supine; avoid wall, incline or prone press-ups for serratus anterior  End-range stretching gently without forceful overpressure in all planes (elevation in scapular plane, ER in scapular plane, functional IR) with stretching done for life as part of daily routine  NO UPPER BODY ERGOMETER  Pain-free AROM for shoulder elevation (expect around 135-150 deg)  Functional strength for all ADLs, work tasks, and hobbies approved by Designer, television/film set with home maintenance program PROM for elevation, ER(0); ER(90)  AROM for elevation, ER(0) and functional IR  Scapulohumeral rhythm/biomechanics of active movement strategies  Strength testing for deltoid, RTC, scapular muscles  Patient reported outcome measure  Pain

## 2024-01-24 ENCOUNTER — Ambulatory Visit

## 2024-01-24 DIAGNOSIS — M25511 Pain in right shoulder: Secondary | ICD-10-CM

## 2024-01-24 DIAGNOSIS — M6281 Muscle weakness (generalized): Secondary | ICD-10-CM

## 2024-01-24 DIAGNOSIS — R293 Abnormal posture: Secondary | ICD-10-CM

## 2024-01-25 NOTE — Therapy (Unsigned)
 OUTPATIENT PHYSICAL THERAPY TREATMENT NOTE   Patient Name: [name] MRN: [id number] DOB:1941-06-24, 83 y.o., female Today's Date: 01/26/2024  END OF SESSION:     PT End of Session - 01/26/24 1357     Visit Number 7    Number of Visits 16    Date for PT Re-Evaluation 03/01/24    Authorization Type [employer] MCR/T[employer]    Authorization Time Period VL Medical Necessity    PT Start Time 1400    PT Stop Time 1440    PT Time Calculation (min) 40 min    Activity Tolerance Patient tolerated treatment well    Behavior During Therapy [facility] for tasks assessed/performed               Past Medical History:  Diagnosis Date   Anemia    PMH;  Patient denies this dx as of 06/03/21   Anxiety    Asthma    Breast cancer (HCC) 1988   left breast cancer   CHF (congestive heart failure) (HCC)    Hx of Acute  HF   Collagenous colitis    Compression fracture    lumbar 1   Dyspnea    with exertion   Heart murmur    Hypertension    Hypothyroidism    Osteoarthritis    Osteopenia    Pneumonia 07/2020   PONV (postoperative nausea and vomiting)    Sleep apnea    wears CPAP nightly   Past Surgical History:  Procedure Laterality Date   BREAST REDUCTION SURGERY     Right   CATARACT EXTRACTION W/ INTRAOCULAR LENS  IMPLANT, BILATERAL     CESAREAN SECTION     COLONOSCOPY W/ BIOPSIES AND POLYPECTOMY     CYST EXCISION N/A 08/30/2018   Procedure: excision of posterior shoulder / back sebaceous cyst;  Surgeon: Lowery Estefana RAMAN, DO;  Location: [facility];  Service: Plastics;  Laterality: N/A;   KNEE ARTHROSCOPY Left 02/23/2022   Procedure: LEFT KNEE ARTHROSCOPY;  Surgeon: Sheril Coy, MD;  Location: [facility];  Service: Orthopedics;  Laterality: Left;   KYPHOPLASTY N/A 02/18/2016   Procedure: LUMBAR 1 KYPHOPLASTY;  Surgeon: Oneil Priestly, MD;  Location: [facility];  Service: Orthopedics;  Laterality: N/A;  LUMBAR 1 KYPHOPLASTY   KYPHOPLASTY N/A 06/04/2021   Procedure:  LUMBAR 2 KYPHOPLASTY;  Surgeon: Priestly Oneil, MD;  Location: [facility];  Service: Orthopedics;  Laterality: N/A;   KYPHOPLASTY N/A 03/31/2023   Procedure: LUMBAR 4 KYPHOPLASTY;  Surgeon: Priestly Oneil, MD;  Location: [facility];  Service: Orthopedics;  Laterality: N/A;   LAPAROSCOPIC APPENDECTOMY N/A 12/09/2021   Procedure: APPENDECTOMY LAPAROSCOPIC;  Surgeon: Teresa Lonni HERO, MD;  Location: [facility];  Service: General;  Laterality: N/A;   LUMBAR DISC SURGERY     MODIFIED RADICAL MASTECTOMY W/ AXILLARY LYMPH NODE DISSECTION     Left   RECONSTRUCTION BREAST W/ LATISSIMUS DORSI FLAP     left   REDUCTION MAMMAPLASTY Right    REVERSE SHOULDER ARTHROPLASTY Right 12/15/2023   Procedure: ARTHROPLASTY, SHOULDER, TOTAL, REVERSE;  Surgeon: Dozier Soulier, MD;  Location: [facility];  Service: Orthopedics;  Laterality: Right;   right foot surgery     corrected hammer toe, straightened big toe   SEPTOPLASTY     TOE SURGERY     TONSILLECTOMY AND ADENOIDECTOMY     Patient Active Problem List   Diagnosis Date Noted   S/P reverse total shoulder arthroplasty, right 12/15/2023   Acute bronchitis with asthma 04/12/2023  Acute appendicitis 12/09/2021   S/P laparoscopic appendectomy 12/09/2021   CAP (community acquired pneumonia) 09/30/2020   Chronic respiratory failure with hypoxia (HCC) 09/23/2020   Allergic rhinitis 08/31/2019   Pilar cyst 08/04/2018   History of breast cancer in female 06/09/2018   History of reconstruction of both breasts 06/09/2018   Severe obesity (BMI >= 40) (HCC) 09/16/2015   Asthma with acute exacerbation 09/15/2015   DOE (dyspnea on exertion) 01/11/2014   Insomnia 05/05/2012   Osteoarthritis    Osteopenia    Collagenous colitis    OSA (obstructive sleep apnea)    Hypothyroidism    Mild persistent asthma    Hypertension    Breast cancer, left breast (HCC)     PCP: Jacques Camie Pepper, PA-C  REFERRING PROVIDER: Dozier Soulier, MD   REFERRING DIAG: S/P reverse total  shoulder arthroplasty, right [Z96.611]   THERAPY DIAG:  Right shoulder pain, unspecified chronicity  Muscle weakness (generalized)  Abnormal posture  Rationale for Evaluation and Treatment: Rehabilitation  ONSET DATE: 12/15/2023 DOS  SUBJECTIVE:                                                                                                                                                                                      SUBJECTIVE STATEMENT: Arrives to session with a slight increase in soreness as she has been more active in the kitchen.  Hand dominance: Right  PERTINENT HISTORY: Relevant PMHx includes anxiety, L breast cancer, acute HF, Lumbar compression fx, DOE, HTN< OA, oteopenia, hypothyroidisma, sleep apnea, R breast reduction procedure  PAIN:  Are you having pain? Yes: NPRS scale: 0/10 current, 5/10 with ADLs  Pain location: R shoulder Pain description: aching  Aggravating factors: ADLs (getting dressed) Relieving factors: prescribed pain medicine, OTC tylenol   PRECAUTIONS: Shoulder  RED FLAGS: None   WEIGHT BEARING RESTRICTIONS: No  FALLS:  Has patient fallen in last 6 months? No  LIVING ENVIRONMENT: Lives with: lives with their family and lives with their spouse Lives in: House/apartment Stairs: No; besides 2-steps to laundry room  Has following equipment at home: Single point cane, Environmental consultant - 4 wheeled, Wheelchair (manual), shower chair, and Grab bars  OCCUPATION: N/a  PLOF: Independent with basic ADLs, Independent with household mobility with device, and Independent with community mobility with device  PATIENT GOALS: To be able to perform toileting and grooming tasks independently.   NEXT MD VISIT: July 28  OBJECTIVE:  Note: Objective measures were completed at Evaluation unless otherwise noted.  DIAGNOSTIC FINDINGS:  11/23/2023: R shoulder CT   IMPRESSION: 1. Severe osteoarthritis of the right glenohumeral joint. 2. Complete supraspinatus  tendon tear. Mild supraspinatus muscle atrophy. Moderate infraspinatus  muscle atrophy.  PATIENT SURVEYS:  Quick DASH: 75   COGNITION: Overall cognitive status: Within functional limits for tasks assessed  POSTURE: Mildly rounded shoulders and forward head   UPPER EXTREMITY ROM:   Passive ROM Right eval Left eval  Shoulder flexion A 115 P 90  Shoulder extension    Shoulder abduction A 150 P 65  Shoulder adduction    Shoulder internal rotation    Shoulder external rotation    Elbow flexion    Elbow extension    Wrist flexion    Wrist extension    Wrist ulnar deviation    Wrist radial deviation    Wrist pronation    Wrist supination    (Blank rows = not tested)  UPPER EXTREMITY MMT: deferred until appropriate d/t post op status   MMT Right eval Left eval  Shoulder flexion    Shoulder extension    Shoulder abduction    Shoulder adduction    Shoulder internal rotation    Shoulder external rotation    Middle trapezius    Lower trapezius    Elbow flexion    Elbow extension    Wrist flexion    Wrist extension    Wrist ulnar deviation    Wrist radial deviation    Wrist pronation    Wrist supination    Grip strength (lbs) 15 lb avg 30 lb avg  (Blank rows = not tested)                                                                                                                             TREATMENT DATE:  [facility] Adult PT Treatment:                                                DATE: 01/26/24 Therapeutic Exercise: OH pulley 5 min Seated shrugs 15x 2s Seated retractions 15x 2s Seated ER (with towel roll) 15x2 UE Ranger Manual Therapy: Rhytmic stabilization at 90d flexion 30s  PROM limited today by guarding/pain Neuromuscular re-ed: Seated OH press 500g ball 15x2 (90d) Seated ER YTB 15x Seated abd elbow flexed 15x2  Supine flexion with stick 15x2 Supine press with stick 15x2 [facility] Adult PT Treatment:                                                DATE:  01/24/24 Therapeutic Exercise: OH pulley 5 min Seated shrugs 15x Seated retractions 15x Manual Therapy: PROM 40d ER, 90d flexion, 80d abd Rhythmic stab at 90d flexion Neuromuscular re-ed: OH press 500g ball 15x2 (90d) Seated ER YTB 15x Seated abd elbow flexed 15x2   [facility] Adult PT Treatment:  DATE: 01/19/24 Therapeutic Exercise: Seated scap squeezes 15x Seated shrugs 15x Seated isometric ER R YTB 15x Supine press with Ranger 15x2 Supine OH flexion with Ranger 15x2 Supine ER with UE Ranger 15x2 OH pulleys 5 min Seated elbow flexion 500g ball 15x2 Neuromuscular re-ed: Supine elbow flexion 15x2 w/500g Supine press 15x2 w/500g ball Seated R abd elbow flexed 15x Seated OH press with 500g ball 15x  [facility] Adult PT Treatment:                                                DATE: 01/17/24 Therapeutic Exercise: Seated scap squeezes 15x Seated shrugs 15x Supine press with Ranger 15x Supine OH flexion with Ranger 15x Supine ER with UE Ranger 15x OH pulleys 5 min Manual Therapy: PROM abd/ER/FF 80/45/110d respectively Supine rhythmic stabilization at 90d flexion 30s x3 Isometric shoulder flex/ext/abd 3s 10x Pec minor release 2 min Neuromuscular re-ed: Supine elbow flexion 15x w/500g Supine press 15x w/500g ball Seated R abd elbow flexed 15x Seated OH press with tennis ball 15x  [facility] Adult PT Treatment:                                                DATE: 01/11/24 Therapeutic Exercise: Seated scap squeezes 15x Seated shrugs 15x Supine press with Ranger 15x Supine OH flexion with Ranger 15x Manual Therapy: PROM abd/ER/FF Gentle joint mobs into distraction/inferior/posterior directions 5x10 Neuromuscular re-ed: Isometric shoulder flex/ext/abd 3s 10x Supine elbow flexion 15x w/tennis ball Supine press 15x w/tennis ball Seated R abd elbow flexed 15x Seated OH press with tennis ball 15x     [facility] Adult PT Treatment:                                                 DATE: 01/05/2024   Initial evaluation: see patient education and home exercise program as noted below     PATIENT EDUCATION: Education details: reviewed initial home exercise program; discussion of POC, prognosis and goals for skilled PT   Person educated: Patient Education method: Explanation, Demonstration, and Handouts Education comprehension: verbalized understanding, returned demonstration, and needs further education  HOME EXERCISE PROGRAM: Access Code: [email or web address] URL: [email or web address] Date: 01/26/2024 Prepared by: Reyes Kohut  Exercises - Seated Single Arm Overhead Elbow Extension with Dumbbell   - 2 x daily - 7 x weekly - 3 sets - 10 reps - Supine Shoulder Flexion Extension AAROM with Dowel  - 2 x daily - 7 x weekly - 3 sets - 10 reps - Seated Shoulder External Rotation AAROM with Dowel  - 2 x daily - 7 x weekly - 3 sets - 10 reps  ASSESSMENT:  CLINICAL IMPRESSION:  Has been tolerated treatment well.  Some next day soreness which resolves within 24 hours, no post session discomfort reported however.  Unable to advance PROM due to pain and guarding.  HEP updated as noted.  Patient at 6 week mark post-op.   [name] is a 83 y.o. female who was seen today for physical therapy evaluation and treatment for R shoulder pain and mobility deficits 3 weeks s/p Right reverse  TSA. She is demonstrating diminished R shoulder PROM, decreased postural endurance, and decreased R grip strength. She has related pain and difficulty with ADLs/IADLs, including dressing, grooming, and bed mobility. She requires skilled PT services at this time to address relevant deficits and improve overall independent function.     OBJECTIVE IMPAIRMENTS: decreased activity tolerance, decreased ROM, decreased strength, impaired UE functional use, improper body mechanics, postural dysfunction, and pain.   ACTIVITY LIMITATIONS: carrying, lifting, sleeping, transfers, bed  mobility, bathing, toileting, dressing, self feeding, reach over head, and hygiene/grooming  PARTICIPATION LIMITATIONS: meal prep, cleaning, laundry, and community activity  PERSONAL FACTORS: Age, Past/current experiences, and 3+ comorbidities: Relevant PMHx includes anxiety, L breast cancer, acute HF, Lumbar compression fx, DOE, HTN< OA, oteopenia, hypothyroidisma, sleep apnea, R breast reduction procedure are also affecting patient's functional outcome.   REHAB POTENTIAL: Fair    CLINICAL DECISION MAKING: Evolving/moderate complexity  EVALUATION COMPLEXITY: Moderate   GOALS: Goals reviewed with patient? YES  SHORT TERM GOALS: Target date: 02/02/2024   Patient will be independent with initial home program at least 3 days/week.  Baseline: provided at eval Goal Status: MET  2.  Patient will demonstrate improved postural awareness for at least 15 minutes while seated without need for cueing from PT.  Baseline: see objective measures Goal Status: INITIAL   3.  Patient will demonstrate improved shoulder AROM to at least 90 degrees of flexion and abduction.   Baseline: PROM 90 flexion, 65 abduction at eval  Goal status: INITIAL   LONG TERM GOALS: Target date: 03/01/2024   Patient will report improved overall functional ability with Quick DASH score of 40 or less.  Baseline: 75 Goal Status: INITIAL  2.  Patient will demonstrate improved R grip strength to at least 25lb, average of 3 trials.  Baseline: 15lb, compared to 30lb on left Goal status: INITIAL  3.  Patient will demonstrate improved functional IR AROM/reaching in order to perform toileting tasks independently.   Baseline: unable Goal status: INITIAL  4.  Patient will demonstrate improved functional ER/AROM/reaching in order to perform grooming/hair styling tasks independently.    Baseline: unable  Goal status: INITIAL    PLAN:  PT FREQUENCY: 1-2x/week  PT DURATION: 8 weeks  PLANNED INTERVENTIONS: 02835- PT  Re-evaluation, 97750- Physical Performance Testing, 97110-Therapeutic exercises, 97530- Therapeutic activity, [id number]- Neuromuscular re-education, 97535- Self Care, 02859- Manual therapy, [phone]- Aquatic Therapy, [id number]- Vasopneumatic device, Patient/Family education, Cryotherapy, and Moist heat  PLAN FOR NEXT SESSION: address shoulder ROM and otherwise progress as appopriate for post op status   [name] PT  01/26/2024 2:56 PM   REVERSE SHOULDER ARTHROPLASTY REHABILITATION GUIDELINES These guidelines do NOT apply to RTSA for proximal humeral fracture - rehabilitation following PHFx will commence 4-6 weeks after surgery as deemed appropriate to protect tuberosity healing PHASE PRECAUTIONS AND GUIDELINES GOALS EXERCISES CRITERIA TO ADVANCE  EXAMINATION  1 (Post-operative day 1 to post-operative week 3)       PHASE PRECAUTIONS AND GUIDELINES GOALS EXERCISES CRITERIA TO ADVANCE  EXAMINATION  2 (Post-operative week 3 to 6) May discontinue sling use at 3 weeks; after 2 weeks can remove the sling at home and just use the sling at night and in community for 3rd week  May use arm for basic activities of daily living (such as feeding, brushing teeth, dressing.)   May submerge in water  after 4 weeks  Avoid combined IR/EXT/ADD (hand behind the back) and IR/ADD (reaching across chest) for dislocation precautions  Avoid acromial or scapular spine pain as  increase deltoid loading - decrease load if this occurs  Elevation to 130 deg and ER to 30 deg - passive, active assisted or active  Low (less than 3/10) to no pain  Ability to fire all heads of the deltoid May discontinue grip, and active elbow and wrist exercises since using the arm in ADLs with sling removed around the home  Continue elevation to 130 and ER to 30, both in scapular plane   Submaximal isometrics (pain-free effort) for all functional heads of deltoid (anterior, posterior, middle).   Active exercise as able:  Supine forward  punch  Place in balanced position with circumduction and progressive arcs in sagittal plane  Side-lying abduction to 90   Lateral raise with bent elbow   Prone extension to hip  Elevation in scapular plane to 130; ER in scapular plane to 30   Ability to fire isometrically all heads of the deltoid muscle without pain  Ability to place and hold the arm in balanced position (90 deg elevation in supine) Wound assessment  Neurovascular assessment  Swelling assessment  ROM shoulder elevation and ER(0)  Patient reported outcome measure  Pain level    PHASE PRECAUTIONS AND GUIDELINES GOALS EXERCISES CRITERIA TO ADVANCE  EXAMINATION  3 (Post-operative week 6 to 12) Avoid forceful end-range motion in any direction   Progress active use of the arm in ADLs without being restricted to arm by the side of the body;   No heavy lifting or carrying  Initiate functional IR behind the back gently without forceful overpressure  Avoid acromial or scapular spine pain as increase deltoid loading - decrease load if this occurs  NO UPPER BODY ERGOMETER  Optimize ROM for elevation and ER in scapular plane   Expected PROM: Elevation - 145-160; ER - 40-50 ; functional IR to L1  Recover AROM to approach as close to PROM available as possible  Establish dynamic stability of the shoulder  Forward elevation in scapular plane active progression: supine to incline to vertical; short to long lever arm  Lateral raise with bent elbow; side-lying abduction  Active ER/IR with arm at side  Scapular retraction with light band resistance  Serratus anterior punches in supine; avoid wall, incline or prone press-ups for serratus anterior  Functional IR with hand slide up back - very gentle and gradual   AROM equals/approaches PROM with good mechanics for elevation  No pain  Higher level demand on shoulder than ADL functions PROM for elevation, ER(0)  AROM for elevation, ER(0) and functional  IR  Patient reported outcome measure  Pain             PHASE PRECAUTIONS AND GUIDELINES GOALS EXERCISES CRITERIA TO ADVANCE  EXAMINATION  4 (Post-operative week 12+) No heavy lifting and no overhead sports  Weight lifting limit 25.lb  No heavy pushing activity  Gradually increase strength   NO UPPER BODY ERGOMETER  Optimize functional use of operative UE to patient specific goals  Gradual increase in deltoid, scapular muscle and rotator cuff strength  Pain-free functional activities Light hand weights for deltoid up to and not to exceed 3 lbs for anterior and posterior with long arm lift against gravity; elbow bent to 90 deg for abduction in scapular plane  Band progression for extension to hip with scapular depression/retraction  Band progression for serratus anterior punches in supine; avoid wall, incline or prone press-ups for serratus anterior  End-range stretching gently without forceful overpressure in all planes (elevation in scapular plane, ER in scapular  plane, functional IR) with stretching done for life as part of daily routine  NO UPPER BODY ERGOMETER  Pain-free AROM for shoulder elevation (expect around 135-150 deg)  Functional strength for all ADLs, work tasks, and hobbies approved by Careers adviser  Independence with home maintenance program PROM for elevation, ER(0); ER(90)  AROM for elevation, ER(0) and functional IR  Scapulohumeral rhythm/biomechanics of active movement strategies  Strength testing for deltoid, RTC, scapular muscles  Patient reported outcome measure  Pain

## 2024-01-26 ENCOUNTER — Ambulatory Visit

## 2024-01-26 DIAGNOSIS — M6281 Muscle weakness (generalized): Secondary | ICD-10-CM

## 2024-01-26 DIAGNOSIS — R293 Abnormal posture: Secondary | ICD-10-CM

## 2024-01-26 DIAGNOSIS — M25511 Pain in right shoulder: Secondary | ICD-10-CM | POA: Diagnosis not present

## 2024-01-31 ENCOUNTER — Ambulatory Visit

## 2024-01-31 DIAGNOSIS — M25511 Pain in right shoulder: Secondary | ICD-10-CM

## 2024-01-31 DIAGNOSIS — M6281 Muscle weakness (generalized): Secondary | ICD-10-CM

## 2024-01-31 DIAGNOSIS — R293 Abnormal posture: Secondary | ICD-10-CM

## 2024-01-31 NOTE — Therapy (Signed)
 OUTPATIENT PHYSICAL THERAPY TREATMENT NOTE   Patient Name: [name] MRN: [id number] DOB:[birth year], 83 y.o., female Today's Date: 01/31/2024  END OF SESSION:     PT End of Session - 01/31/24 1412     Visit Number 8    Number of Visits 16    Date for PT Re-Evaluation 03/01/24    Authorization Type [employer] M[employer]    Authorization Time Period VL Medical Necessity    Progress Note Due on Visit 10    PT Start Time 1400    PT Stop Time 1440    PT Time Calculation (min) 40 min    Activity Tolerance Patient tolerated treatment well    Behavior During Therapy [facility] for tasks assessed/performed                Past Medical History:  Diagnosis Date   Anemia    PMH;  Patient denies this dx as of 06/03/21   Anxiety    Asthma    Breast cancer (HCC) 1988   left breast cancer   CHF (congestive heart failure) (HCC)    Hx of Acute  HF   Collagenous colitis    Compression fracture    lumbar 1   Dyspnea    with exertion   Heart murmur    Hypertension    Hypothyroidism    Osteoarthritis    Osteopenia    Pneumonia 07/2020   PONV (postoperative nausea and vomiting)    Sleep apnea    wears CPAP nightly   Past Surgical History:  Procedure Laterality Date   BREAST REDUCTION SURGERY     Right   CATARACT EXTRACTION W/ INTRAOCULAR LENS  IMPLANT, BILATERAL     CESAREAN SECTION     COLONOSCOPY W/ BIOPSIES AND POLYPECTOMY     CYST EXCISION N/A 08/30/2018   Procedure: excision of posterior shoulder / back sebaceous cyst;  Surgeon: Lowery Estefana RAMAN, DO;  Location: [facility];  Service: Plastics;  Laterality: N/A;   KNEE ARTHROSCOPY Left 02/23/2022   Procedure: LEFT KNEE ARTHROSCOPY;  Surgeon: Sheril Coy, MD;  Location: [facility];  Service: Orthopedics;  Laterality: Left;   KYPHOPLASTY N/A 02/18/2016   Procedure: LUMBAR 1 KYPHOPLASTY;  Surgeon: Oneil Priestly, MD;  Location: [facility];  Service: Orthopedics;  Laterality: N/A;  LUMBAR 1 KYPHOPLASTY    KYPHOPLASTY N/A 06/04/2021   Procedure: LUMBAR 2 KYPHOPLASTY;  Surgeon: Priestly Oneil, MD;  Location: [facility];  Service: Orthopedics;  Laterality: N/A;   KYPHOPLASTY N/A 03/31/2023   Procedure: LUMBAR 4 KYPHOPLASTY;  Surgeon: Priestly Oneil, MD;  Location: [facility];  Service: Orthopedics;  Laterality: N/A;   LAPAROSCOPIC APPENDECTOMY N/A 12/09/2021   Procedure: APPENDECTOMY LAPAROSCOPIC;  Surgeon: Teresa Lonni HERO, MD;  Location: [facility];  Service: General;  Laterality: N/A;   LUMBAR DISC SURGERY     MODIFIED RADICAL MASTECTOMY W/ AXILLARY LYMPH NODE DISSECTION     Left   RECONSTRUCTION BREAST W/ LATISSIMUS DORSI FLAP     left   REDUCTION MAMMAPLASTY Right    REVERSE SHOULDER ARTHROPLASTY Right 12/15/2023   Procedure: ARTHROPLASTY, SHOULDER, TOTAL, REVERSE;  Surgeon: Dozier Soulier, MD;  Location: [facility];  Service: Orthopedics;  Laterality: Right;   right foot surgery     corrected hammer toe, straightened big toe   SEPTOPLASTY     TOE SURGERY     TONSILLECTOMY AND ADENOIDECTOMY     Patient Active Problem List   Diagnosis Date Noted   S/P reverse total shoulder arthroplasty, right  12/15/2023   Acute bronchitis with asthma 04/12/2023   Acute appendicitis 12/09/2021   S/P laparoscopic appendectomy 12/09/2021   CAP (community acquired pneumonia) 09/30/2020   Chronic respiratory failure with hypoxia (HCC) 09/23/2020   Allergic rhinitis 08/31/2019   Pilar cyst 08/04/2018   History of breast cancer in female 06/09/2018   History of reconstruction of both breasts 06/09/2018   Severe obesity (BMI >= 40) (HCC) 09/16/2015   Asthma with acute exacerbation 09/15/2015   DOE (dyspnea on exertion) 01/11/2014   Insomnia 05/05/2012   Osteoarthritis    Osteopenia    Collagenous colitis    OSA (obstructive sleep apnea)    Hypothyroidism    Mild persistent asthma    Hypertension    Breast cancer, left breast (HCC)     PCP: Jacques Camie Pepper, PA-C  REFERRING PROVIDER: Dozier Soulier, MD    REFERRING DIAG: S/P reverse total shoulder arthroplasty, right [Z96.611]   THERAPY DIAG:  Right shoulder pain, unspecified chronicity  Muscle weakness (generalized)  Abnormal posture  Rationale for Evaluation and Treatment: Rehabilitation  ONSET DATE: 12/15/2023 DOS  SUBJECTIVE:                                                                                                                                                                                      SUBJECTIVE STATEMENT: MD f/u yesterday, progressing well and will return in 6 weeks for imaging studies.  Hand dominance: Right  PERTINENT HISTORY: Relevant PMHx includes anxiety, L breast cancer, acute HF, Lumbar compression fx, DOE, HTN< OA, oteopenia, hypothyroidisma, sleep apnea, R breast reduction procedure  PAIN:  Are you having pain? Yes: NPRS scale: 0/10 current, 5/10 with ADLs  Pain location: R shoulder Pain description: aching  Aggravating factors: ADLs (getting dressed) Relieving factors: prescribed pain medicine, OTC tylenol   PRECAUTIONS: Shoulder  RED FLAGS: None   WEIGHT BEARING RESTRICTIONS: No  FALLS:  Has patient fallen in last 6 months? No  LIVING ENVIRONMENT: Lives with: lives with their family and lives with their spouse Lives in: House/apartment Stairs: No; besides 2-steps to laundry room  Has following equipment at home: Single point cane, Environmental consultant - 4 wheeled, Wheelchair (manual), shower chair, and Grab bars  OCCUPATION: N/a  PLOF: Independent with basic ADLs, Independent with household mobility with device, and Independent with community mobility with device  PATIENT GOALS: To be able to perform toileting and grooming tasks independently.   NEXT MD VISIT: July 28  OBJECTIVE:  Note: Objective measures were completed at Evaluation unless otherwise noted.  DIAGNOSTIC FINDINGS:  11/23/2023: R shoulder CT   IMPRESSION: 1. Severe osteoarthritis of the right glenohumeral joint. 2.  Complete supraspinatus tendon tear.  Mild supraspinatus muscle atrophy. Moderate infraspinatus muscle atrophy.  PATIENT SURVEYS:  Quick DASH: 75   COGNITION: Overall cognitive status: Within functional limits for tasks assessed  POSTURE: Mildly rounded shoulders and forward head   UPPER EXTREMITY ROM:   Passive ROM Right eval Left eval  Shoulder flexion A 115 P 90  Shoulder extension    Shoulder abduction A 150 P 65  Shoulder adduction    Shoulder internal rotation    Shoulder external rotation    Elbow flexion    Elbow extension    Wrist flexion    Wrist extension    Wrist ulnar deviation    Wrist radial deviation    Wrist pronation    Wrist supination    (Blank rows = not tested)  UPPER EXTREMITY MMT: deferred until appropriate d/t post op status   MMT Right eval Left eval  Shoulder flexion    Shoulder extension    Shoulder abduction    Shoulder adduction    Shoulder internal rotation    Shoulder external rotation    Middle trapezius    Lower trapezius    Elbow flexion    Elbow extension    Wrist flexion    Wrist extension    Wrist ulnar deviation    Wrist radial deviation    Wrist pronation    Wrist supination    Grip strength (lbs) 15 lb avg 30 lb avg  (Blank rows = not tested)                                                                                                                             TREATMENT DATE:  [facility] Adult PT Treatment:                                                DATE: 01/31/24 Therapeutic Exercise: OH pulley 5 min Seated shrugs 15x  Seated retractions 15x  Seated ER (with towel roll) 15x2 UE Ranger Seated chest press YTB 15x2 Manual Therapy: Rhytmic stabilization at 90d flexion 30s  PROM limited today by guarding/pain Neuromuscular re-ed: Seated OH press 500g ball 15x2 (90d) Seated ER YTB 15x Seated abd elbow extended 15x2 (in scapular plane) Supine flexion with stick 15x2 Supine press with stick 15x2  [facility] Adult  PT Treatment:                                                DATE: 01/26/24 Therapeutic Exercise: OH pulley 5 min Seated shrugs 15x 2s Seated retractions 15x 2s Seated ER (with towel roll) 15x2 UE Ranger Manual Therapy: Rhytmic stabilization at 90d flexion 30s  PROM limited today by guarding/pain Neuromuscular re-ed: Seated OH press 500g ball  15x2 (90d) Seated ER YTB 15x Seated abd elbow flexed 15x2  Supine flexion with stick 15x2 Supine press with stick 15x2 [facility] Adult PT Treatment:                                                DATE: 01/24/24 Therapeutic Exercise: OH pulley 5 min Seated shrugs 15x Seated retractions 15x Manual Therapy: PROM 40d ER, 90d flexion, 80d abd Rhythmic stab at 90d flexion Neuromuscular re-ed: OH press 500g ball 15x2 (90d) Seated ER YTB 15x Seated abd elbow flexed 15x2       [facility] Adult PT Treatment:                                                DATE: 01/05/2024   Initial evaluation: see patient education and home exercise program as noted below     PATIENT EDUCATION: Education details: reviewed initial home exercise program; discussion of POC, prognosis and goals for skilled PT   Person educated: Patient Education method: Explanation, Demonstration, and Handouts Education comprehension: verbalized understanding, returned demonstration, and needs further education  HOME EXERCISE PROGRAM: Access Code: [email or web address] URL: [email or web address] Date: 01/26/2024 Prepared by: Reyes Kohut  Exercises - Seated Single Arm Overhead Elbow Extension with Dumbbell   - 2 x daily - 7 x weekly - 3 sets - 10 reps - Supine Shoulder Flexion Extension AAROM with Dowel  - 2 x daily - 7 x weekly - 3 sets - 10 reps - Seated Shoulder External Rotation AAROM with Dowel  - 2 x daily - 7 x weekly - 3 sets - 10 reps  ASSESSMENT:  CLINICAL IMPRESSION:  Continued with strengthening and ROM priorities.  Added seated chest press and abduction with extended  elbow in scapular plane.  Able to resist more during rhythmic stabilization tasks.   [name] is a 83 y.o. female who was seen today for physical therapy evaluation and treatment for R shoulder pain and mobility deficits 3 weeks s/p Right reverse TSA. She is demonstrating diminished R shoulder PROM, decreased postural endurance, and decreased R grip strength. She has related pain and difficulty with ADLs/IADLs, including dressing, grooming, and bed mobility. She requires skilled PT services at this time to address relevant deficits and improve overall independent function.     OBJECTIVE IMPAIRMENTS: decreased activity tolerance, decreased ROM, decreased strength, impaired UE functional use, improper body mechanics, postural dysfunction, and pain.   ACTIVITY LIMITATIONS: carrying, lifting, sleeping, transfers, bed mobility, bathing, toileting, dressing, self feeding, reach over head, and hygiene/grooming  PARTICIPATION LIMITATIONS: meal prep, cleaning, laundry, and community activity  PERSONAL FACTORS: Age, Past/current experiences, and 3+ comorbidities: Relevant PMHx includes anxiety, L breast cancer, acute HF, Lumbar compression fx, DOE, HTN< OA, oteopenia, hypothyroidisma, sleep apnea, R breast reduction procedure are also affecting patient's functional outcome.   REHAB POTENTIAL: Fair    CLINICAL DECISION MAKING: Evolving/moderate complexity  EVALUATION COMPLEXITY: Moderate   GOALS: Goals reviewed with patient? YES  SHORT TERM GOALS: Target date: 02/02/2024   Patient will be independent with initial home program at least 3 days/week.  Baseline: provided at eval Goal Status: MET  2.  Patient will demonstrate improved postural awareness for at least 15 minutes while seated without need for cueing  from PT.  Baseline: see objective measures Goal Status: INITIAL   3.  Patient will demonstrate improved shoulder AROM to at least 90 degrees of flexion and abduction.   Baseline: PROM 90  flexion, 65 abduction at eval  Goal status: INITIAL   LONG TERM GOALS: Target date: 03/01/2024   Patient will report improved overall functional ability with Quick DASH score of 40 or less.  Baseline: 75 Goal Status: INITIAL  2.  Patient will demonstrate improved R grip strength to at least 25lb, average of 3 trials.  Baseline: 15lb, compared to 30lb on left Goal status: INITIAL  3.  Patient will demonstrate improved functional IR AROM/reaching in order to perform toileting tasks independently.   Baseline: unable Goal status: INITIAL  4.  Patient will demonstrate improved functional ER/AROM/reaching in order to perform grooming/hair styling tasks independently.    Baseline: unable  Goal status: INITIAL    PLAN:  PT FREQUENCY: 1-2x/week  PT DURATION: 8 weeks  PLANNED INTERVENTIONS: 02835- PT Re-evaluation, 97750- Physical Performance Testing, 97110-Therapeutic exercises, 97530- Therapeutic activity, 97112- Neuromuscular re-education, 97535- Self Care, 02859- Manual therapy, [phone]- Aquatic Therapy, [id number]- Vasopneumatic device, Patient/Family education, Cryotherapy, and Moist heat  PLAN FOR NEXT SESSION: address shoulder ROM and otherwise progress as appopriate for post op status   Jeff Fanta Wimberley PT  01/31/2024 2:51 PM   REVERSE SHOULDER ARTHROPLASTY REHABILITATION GUIDELINES These guidelines do NOT apply to RTSA for proximal humeral fracture - rehabilitation following PHFx will commence 4-6 weeks after surgery as deemed appropriate to protect tuberosity healing PHASE PRECAUTIONS AND GUIDELINES GOALS EXERCISES CRITERIA TO ADVANCE  EXAMINATION  1 (Post-operative day 1 to post-operative week 3)       PHASE PRECAUTIONS AND GUIDELINES GOALS EXERCISES CRITERIA TO ADVANCE  EXAMINATION  2 (Post-operative week 3 to 6) May discontinue sling use at 3 weeks; after 2 weeks can remove the sling at home and just use the sling at night and in community for 3rd week  May use arm for basic  activities of daily living (such as feeding, brushing teeth, dressing.)   May submerge in water  after 4 weeks  Avoid combined IR/EXT/ADD (hand behind the back) and IR/ADD (reaching across chest) for dislocation precautions  Avoid acromial or scapular spine pain as increase deltoid loading - decrease load if this occurs  Elevation to 130 deg and ER to 30 deg - passive, active assisted or active  Low (less than 3/10) to no pain  Ability to fire all heads of the deltoid May discontinue grip, and active elbow and wrist exercises since using the arm in ADLs with sling removed around the home  Continue elevation to 130 and ER to 30, both in scapular plane   Submaximal isometrics (pain-free effort) for all functional heads of deltoid (anterior, posterior, middle).   Active exercise as able:  Supine forward punch  Place in balanced position with circumduction and progressive arcs in sagittal plane  Side-lying abduction to 90   Lateral raise with bent elbow   Prone extension to hip  Elevation in scapular plane to 130; ER in scapular plane to 30   Ability to fire isometrically all heads of the deltoid muscle without pain  Ability to place and hold the arm in balanced position (90 deg elevation in supine) Wound assessment  Neurovascular assessment  Swelling assessment  ROM shoulder elevation and ER(0)  Patient reported outcome measure  Pain level    PHASE PRECAUTIONS AND GUIDELINES GOALS EXERCISES CRITERIA TO ADVANCE  EXAMINATION  3 (  Post-operative week 6 to 12) Avoid forceful end-range motion in any direction   Progress active use of the arm in ADLs without being restricted to arm by the side of the body;   No heavy lifting or carrying  Initiate functional IR behind the back gently without forceful overpressure  Avoid acromial or scapular spine pain as increase deltoid loading - decrease load if this occurs  NO UPPER BODY ERGOMETER  Optimize ROM for elevation and ER in  scapular plane   Expected PROM: Elevation - 145-160; ER - 40-50 ; functional IR to L1  Recover AROM to approach as close to PROM available as possible  Establish dynamic stability of the shoulder  Forward elevation in scapular plane active progression: supine to incline to vertical; short to long lever arm  Lateral raise with bent elbow; side-lying abduction  Active ER/IR with arm at side  Scapular retraction with light band resistance  Serratus anterior punches in supine; avoid wall, incline or prone press-ups for serratus anterior  Functional IR with hand slide up back - very gentle and gradual   AROM equals/approaches PROM with good mechanics for elevation  No pain  Higher level demand on shoulder than ADL functions PROM for elevation, ER(0)  AROM for elevation, ER(0) and functional IR  Patient reported outcome measure  Pain             PHASE PRECAUTIONS AND GUIDELINES GOALS EXERCISES CRITERIA TO ADVANCE  EXAMINATION  4 (Post-operative week 12+) No heavy lifting and no overhead sports  Weight lifting limit 25.lb  No heavy pushing activity  Gradually increase strength   NO UPPER BODY ERGOMETER  Optimize functional use of operative UE to patient specific goals  Gradual increase in deltoid, scapular muscle and rotator cuff strength  Pain-free functional activities Light hand weights for deltoid up to and not to exceed 3 lbs for anterior and posterior with long arm lift against gravity; elbow bent to 90 deg for abduction in scapular plane  Band progression for extension to hip with scapular depression/retraction  Band progression for serratus anterior punches in supine; avoid wall, incline or prone press-ups for serratus anterior  End-range stretching gently without forceful overpressure in all planes (elevation in scapular plane, ER in scapular plane, functional IR) with stretching done for life as part of daily routine  NO UPPER BODY ERGOMETER  Pain-free  AROM for shoulder elevation (expect around 135-150 deg)  Functional strength for all ADLs, work tasks, and hobbies approved by Designer, television/film set with home maintenance program PROM for elevation, ER(0); ER(90)  AROM for elevation, ER(0) and functional IR  Scapulohumeral rhythm/biomechanics of active movement strategies  Strength testing for deltoid, RTC, scapular muscles  Patient reported outcome measure  Pain

## 2024-02-01 NOTE — Therapy (Unsigned)
 OUTPATIENT PHYSICAL THERAPY TREATMENT NOTE   Patient Name: [name] MRN: [id number] DOB:1941/01/26, 83 y.o., female Today's Date: 02/02/2024  END OF SESSION:     PT End of Session - 02/02/24 1546     Visit Number 9    Number of Visits 16    Date for PT Re-Evaluation 03/01/24    Authorization Type [employer] M[employer]    Authorization Time Period VL Medical Necessity    Progress Note Due on Visit 10    PT Start Time 1530    PT Stop Time 1610    PT Time Calculation (min) 40 min    Activity Tolerance Patient tolerated treatment well    Behavior During Therapy [facility] for tasks assessed/performed         Past Medical History:  Diagnosis Date   Anemia    PMH;  Patient denies this dx as of 06/03/21   Anxiety    Asthma    Breast cancer (HCC) 1988   left breast cancer   CHF (congestive heart failure) (HCC)    Hx of Acute  HF   Collagenous colitis    Compression fracture    lumbar 1   Dyspnea    with exertion   Heart murmur    Hypertension    Hypothyroidism    Osteoarthritis    Osteopenia    Pneumonia 07/2020   PONV (postoperative nausea and vomiting)    Sleep apnea    wears CPAP nightly   Past Surgical History:  Procedure Laterality Date   BREAST REDUCTION SURGERY     Right   CATARACT EXTRACTION W/ INTRAOCULAR LENS  IMPLANT, BILATERAL     CESAREAN SECTION     COLONOSCOPY W/ BIOPSIES AND POLYPECTOMY     CYST EXCISION N/A 08/30/2018   Procedure: excision of posterior shoulder / back sebaceous cyst;  Surgeon: Lowery Estefana RAMAN, DO;  Location: [facility];  Service: Plastics;  Laterality: N/A;   KNEE ARTHROSCOPY Left 02/23/2022   Procedure: LEFT KNEE ARTHROSCOPY;  Surgeon: Sheril Coy, MD;  Location: [facility];  Service: Orthopedics;  Laterality: Left;   KYPHOPLASTY N/A 02/18/2016   Procedure: LUMBAR 1 KYPHOPLASTY;  Surgeon: Oneil Priestly, MD;  Location: [facility];  Service: Orthopedics;  Laterality: N/A;  LUMBAR 1 KYPHOPLASTY   KYPHOPLASTY N/A  06/04/2021   Procedure: LUMBAR 2 KYPHOPLASTY;  Surgeon: Priestly Oneil, MD;  Location: [facility];  Service: Orthopedics;  Laterality: N/A;   KYPHOPLASTY N/A 03/31/2023   Procedure: LUMBAR 4 KYPHOPLASTY;  Surgeon: Priestly Oneil, MD;  Location: [facility];  Service: Orthopedics;  Laterality: N/A;   LAPAROSCOPIC APPENDECTOMY N/A 12/09/2021   Procedure: APPENDECTOMY LAPAROSCOPIC;  Surgeon: Teresa Lonni HERO, MD;  Location: [facility];  Service: General;  Laterality: N/A;   LUMBAR DISC SURGERY     MODIFIED RADICAL MASTECTOMY W/ AXILLARY LYMPH NODE DISSECTION     Left   RECONSTRUCTION BREAST W/ LATISSIMUS DORSI FLAP     left   REDUCTION MAMMAPLASTY Right    REVERSE SHOULDER ARTHROPLASTY Right 12/15/2023   Procedure: ARTHROPLASTY, SHOULDER, TOTAL, REVERSE;  Surgeon: Dozier Soulier, MD;  Location: [facility];  Service: Orthopedics;  Laterality: Right;   right foot surgery     corrected hammer toe, straightened big toe   SEPTOPLASTY     TOE SURGERY     TONSILLECTOMY AND ADENOIDECTOMY     Patient Active Problem List   Diagnosis Date Noted   S/P reverse total shoulder arthroplasty, right 12/15/2023   Acute bronchitis with asthma  04/12/2023   Acute appendicitis 12/09/2021   S/P laparoscopic appendectomy 12/09/2021   CAP (community acquired pneumonia) 09/30/2020   Chronic respiratory failure with hypoxia (HCC) 09/23/2020   Allergic rhinitis 08/31/2019   Pilar cyst 08/04/2018   History of breast cancer in female 06/09/2018   History of reconstruction of both breasts 06/09/2018   Severe obesity (BMI >= 40) (HCC) 09/16/2015   Asthma with acute exacerbation 09/15/2015   DOE (dyspnea on exertion) 01/11/2014   Insomnia 05/05/2012   Osteoarthritis    Osteopenia    Collagenous colitis    OSA (obstructive sleep apnea)    Hypothyroidism    Mild persistent asthma    Hypertension    Breast cancer, left breast (HCC)     PCP: Jacques Camie Pepper, PA-C  REFERRING PROVIDER: Dozier Soulier, MD   REFERRING  DIAG: S/P reverse total shoulder arthroplasty, right [Z96.611]   THERAPY DIAG:  Right shoulder pain, unspecified chronicity  Muscle weakness (generalized)  Abnormal posture  Rationale for Evaluation and Treatment: Rehabilitation  ONSET DATE: 12/15/2023 DOS  SUBJECTIVE:                                                                                                                                                                                      SUBJECTIVE STATEMENT: MD f/u yesterday, progressing well and will return in 6 weeks for imaging studies.  Hand dominance: Right  PERTINENT HISTORY: Relevant PMHx includes anxiety, L breast cancer, acute HF, Lumbar compression fx, DOE, HTN< OA, oteopenia, hypothyroidisma, sleep apnea, R breast reduction procedure  PAIN:  Are you having pain? Yes: NPRS scale: 0/10 current, 5/10 with ADLs  Pain location: R shoulder Pain description: aching  Aggravating factors: ADLs (getting dressed) Relieving factors: prescribed pain medicine, OTC tylenol   PRECAUTIONS: Shoulder  RED FLAGS: None   WEIGHT BEARING RESTRICTIONS: No  FALLS:  Has patient fallen in last 6 months? No  LIVING ENVIRONMENT: Lives with: lives with their family and lives with their spouse Lives in: House/apartment Stairs: No; besides 2-steps to laundry room  Has following equipment at home: Single point cane, Environmental consultant - 4 wheeled, Wheelchair (manual), shower chair, and Grab bars  OCCUPATION: N/a  PLOF: Independent with basic ADLs, Independent with household mobility with device, and Independent with community mobility with device  PATIENT GOALS: To be able to perform toileting and grooming tasks independently.   NEXT MD VISIT: July 28  OBJECTIVE:  Note: Objective measures were completed at Evaluation unless otherwise noted.  DIAGNOSTIC FINDINGS:  11/23/2023: R shoulder CT   IMPRESSION: 1. Severe osteoarthritis of the right glenohumeral joint. 2. Complete  supraspinatus tendon tear. Mild supraspinatus muscle atrophy. Moderate infraspinatus muscle  atrophy.  PATIENT SURVEYS:  Quick DASH: 75   COGNITION: Overall cognitive status: Within functional limits for tasks assessed  POSTURE: Mildly rounded shoulders and forward head   UPPER EXTREMITY ROM:   Passive ROM Right eval Left eval  Shoulder flexion A 115 P 90  Shoulder extension    Shoulder abduction A 150 P 65  Shoulder adduction    Shoulder internal rotation    Shoulder external rotation    Elbow flexion    Elbow extension    Wrist flexion    Wrist extension    Wrist ulnar deviation    Wrist radial deviation    Wrist pronation    Wrist supination    (Blank rows = not tested)  UPPER EXTREMITY MMT: deferred until appropriate d/t post op status   MMT Right eval Left eval  Shoulder flexion    Shoulder extension    Shoulder abduction    Shoulder adduction    Shoulder internal rotation    Shoulder external rotation    Middle trapezius    Lower trapezius    Elbow flexion    Elbow extension    Wrist flexion    Wrist extension    Wrist ulnar deviation    Wrist radial deviation    Wrist pronation    Wrist supination    Grip strength (lbs) 15 lb avg 30 lb avg  (Blank rows = not tested)                                                                                                                             TREATMENT DATE:  [facility] Adult PT Treatment:                                                DATE: 02/02/24 Therapeutic Exercise: OH pulley 5 min Seated shrugs 15x  Seated retractions 15x  Seated ER (with towel roll) 15x2 UE Ranger Seated chest press YTB 15x2 Manual Therapy: Rhytmic stabilization at 90d flexion 30s  PROM increased to ~120d flexion Neuromuscular re-ed: Seated OH press 500g ball 15x2 (90d) Seated ER YTB 15x Seated abd elbow extended 15x2 (in scapular plane) Supine flexion with stick 15x2 Supine press with stick 15x2    [facility] Adult PT  Treatment:                                                DATE: 01/31/24 Therapeutic Exercise: OH pulley 5 min Seated shrugs 15x  Seated retractions 15x  Seated ER (with towel roll) 15x2 UE Ranger Seated chest press YTB 15x2 Manual Therapy: Rhytmic stabilization at 90d flexion 30s  PROM limited today by guarding/pain Neuromuscular re-ed: Seated OH press 500g ball  15x2 (90d) Seated ER YTB 15x Seated abd elbow extended 15x2 (in scapular plane) Supine flexion with stick 15x2 Supine press with stick 15x2  [facility] Adult PT Treatment:                                                DATE: 01/26/24 Therapeutic Exercise: OH pulley 5 min Seated shrugs 15x 2s Seated retractions 15x 2s Seated ER (with towel roll) 15x2 UE Ranger Manual Therapy: Rhytmic stabilization at 90d flexion 30s  PROM limited today by guarding/pain Neuromuscular re-ed: Seated OH press 500g ball 15x2 (90d) Seated ER YTB 15x Seated abd elbow flexed 15x2  Supine flexion with stick 15x2 Supine press with stick 15x2 [facility] Adult PT Treatment:                                                DATE: 01/24/24 Therapeutic Exercise: OH pulley 5 min Seated shrugs 15x Seated retractions 15x Manual Therapy: PROM 40d ER, 90d flexion, 80d abd Rhythmic stab at 90d flexion Neuromuscular re-ed: OH press 500g ball 15x2 (90d) Seated ER YTB 15x Seated abd elbow flexed 15x2       [facility] Adult PT Treatment:                                                DATE: 01/05/2024   Initial evaluation: see patient education and home exercise program as noted below     PATIENT EDUCATION: Education details: reviewed initial home exercise program; discussion of POC, prognosis and goals for skilled PT   Person educated: Patient Education method: Explanation, Demonstration, and Handouts Education comprehension: verbalized understanding, returned demonstration, and needs further education  HOME EXERCISE PROGRAM: Access Code: [email or web address] URL:  [email or web address] Date: 01/26/2024 Prepared by: Reyes Kohut  Exercises - Seated Single Arm Overhead Elbow Extension with Dumbbell   - 2 x daily - 7 x weekly - 3 sets - 10 reps - Supine Shoulder Flexion Extension AAROM with Dowel  - 2 x daily - 7 x weekly - 3 sets - 10 reps - Seated Shoulder External Rotation AAROM with Dowel  - 2 x daily - 7 x weekly - 3 sets - 10 reps  ASSESSMENT:  CLINICAL IMPRESSION:  Continued with strengthening and ROM priorities.  Continued to emphasize AROM and deltoid strengthening.  Marked gains in OH flexion noted.   [name] is a 83 y.o. female who was seen today for physical therapy evaluation and treatment for R shoulder pain and mobility deficits 3 weeks s/p Right reverse TSA. She is demonstrating diminished R shoulder PROM, decreased postural endurance, and decreased R grip strength. She has related pain and difficulty with ADLs/IADLs, including dressing, grooming, and bed mobility. She requires skilled PT services at this time to address relevant deficits and improve overall independent function.     OBJECTIVE IMPAIRMENTS: decreased activity tolerance, decreased ROM, decreased strength, impaired UE functional use, improper body mechanics, postural dysfunction, and pain.   ACTIVITY LIMITATIONS: carrying, lifting, sleeping, transfers, bed mobility, bathing, toileting, dressing, self feeding, reach over head, and hygiene/grooming  PARTICIPATION LIMITATIONS: meal prep, cleaning, laundry, and  community activity  PERSONAL FACTORS: Age, Past/current experiences, and 3+ comorbidities: Relevant PMHx includes anxiety, L breast cancer, acute HF, Lumbar compression fx, DOE, HTN< OA, oteopenia, hypothyroidisma, sleep apnea, R breast reduction procedure are also affecting patient's functional outcome.   REHAB POTENTIAL: Fair    CLINICAL DECISION MAKING: Evolving/moderate complexity  EVALUATION COMPLEXITY: Moderate   GOALS: Goals reviewed with  patient? YES  SHORT TERM GOALS: Target date: 02/02/2024   Patient will be independent with initial home program at least 3 days/week.  Baseline: provided at eval Goal Status: MET  2.  Patient will demonstrate improved postural awareness for at least 15 minutes while seated without need for cueing from PT.  Baseline: see objective measures Goal Status: INITIAL   3.  Patient will demonstrate improved shoulder AROM to at least 90 degrees of flexion and abduction.   Baseline: PROM 90 flexion, 65 abduction at eval  Goal status: INITIAL   LONG TERM GOALS: Target date: 03/01/2024   Patient will report improved overall functional ability with Quick DASH score of 40 or less.  Baseline: 75 Goal Status: INITIAL  2.  Patient will demonstrate improved R grip strength to at least 25lb, average of 3 trials.  Baseline: 15lb, compared to 30lb on left Goal status: INITIAL  3.  Patient will demonstrate improved functional IR AROM/reaching in order to perform toileting tasks independently.   Baseline: unable Goal status: INITIAL  4.  Patient will demonstrate improved functional ER/AROM/reaching in order to perform grooming/hair styling tasks independently.    Baseline: unable  Goal status: INITIAL    PLAN:  PT FREQUENCY: 1-2x/week  PT DURATION: 8 weeks  PLANNED INTERVENTIONS: 02835- PT Re-evaluation, 97750- Physical Performance Testing, 97110-Therapeutic exercises, 97530- Therapeutic activity, 97112- Neuromuscular re-education, 97535- Self Care, 02859- Manual therapy, [phone]- Aquatic Therapy, 97016- Vasopneumatic device, Patient/Family education, Cryotherapy, and Moist heat  PLAN FOR NEXT SESSION: address shoulder ROM and otherwise progress as appopriate for post op status   Jeff Belem Hintze PT  02/02/2024 4:10 PM   REVERSE SHOULDER ARTHROPLASTY REHABILITATION GUIDELINES These guidelines do NOT apply to RTSA for proximal humeral fracture - rehabilitation following PHFx will commence 4-6  weeks after surgery as deemed appropriate to protect tuberosity healing PHASE PRECAUTIONS AND GUIDELINES GOALS EXERCISES CRITERIA TO ADVANCE  EXAMINATION  1 (Post-operative day 1 to post-operative week 3)       PHASE PRECAUTIONS AND GUIDELINES GOALS EXERCISES CRITERIA TO ADVANCE  EXAMINATION  2 (Post-operative week 3 to 6) May discontinue sling use at 3 weeks; after 2 weeks can remove the sling at home and just use the sling at night and in community for 3rd week  May use arm for basic activities of daily living (such as feeding, brushing teeth, dressing.)   May submerge in water  after 4 weeks  Avoid combined IR/EXT/ADD (hand behind the back) and IR/ADD (reaching across chest) for dislocation precautions  Avoid acromial or scapular spine pain as increase deltoid loading - decrease load if this occurs  Elevation to 130 deg and ER to 30 deg - passive, active assisted or active  Low (less than 3/10) to no pain  Ability to fire all heads of the deltoid May discontinue grip, and active elbow and wrist exercises since using the arm in ADLs with sling removed around the home  Continue elevation to 130 and ER to 30, both in scapular plane   Submaximal isometrics (pain-free effort) for all functional heads of deltoid (anterior, posterior, middle).   Active exercise as able:  Supine  forward punch  Place in balanced position with circumduction and progressive arcs in sagittal plane  Side-lying abduction to 90   Lateral raise with bent elbow   Prone extension to hip  Elevation in scapular plane to 130; ER in scapular plane to 30   Ability to fire isometrically all heads of the deltoid muscle without pain  Ability to place and hold the arm in balanced position (90 deg elevation in supine) Wound assessment  Neurovascular assessment  Swelling assessment  ROM shoulder elevation and ER(0)  Patient reported outcome measure  Pain level    PHASE PRECAUTIONS AND GUIDELINES GOALS  EXERCISES CRITERIA TO ADVANCE  EXAMINATION  3 (Post-operative week 6 to 12) Avoid forceful end-range motion in any direction   Progress active use of the arm in ADLs without being restricted to arm by the side of the body;   No heavy lifting or carrying  Initiate functional IR behind the back gently without forceful overpressure  Avoid acromial or scapular spine pain as increase deltoid loading - decrease load if this occurs  NO UPPER BODY ERGOMETER  Optimize ROM for elevation and ER in scapular plane   Expected PROM: Elevation - 145-160; ER - 40-50 ; functional IR to L1  Recover AROM to approach as close to PROM available as possible  Establish dynamic stability of the shoulder  Forward elevation in scapular plane active progression: supine to incline to vertical; short to long lever arm  Lateral raise with bent elbow; side-lying abduction  Active ER/IR with arm at side  Scapular retraction with light band resistance  Serratus anterior punches in supine; avoid wall, incline or prone press-ups for serratus anterior  Functional IR with hand slide up back - very gentle and gradual   AROM equals/approaches PROM with good mechanics for elevation  No pain  Higher level demand on shoulder than ADL functions PROM for elevation, ER(0)  AROM for elevation, ER(0) and functional IR  Patient reported outcome measure  Pain             PHASE PRECAUTIONS AND GUIDELINES GOALS EXERCISES CRITERIA TO ADVANCE  EXAMINATION  4 (Post-operative week 12+) No heavy lifting and no overhead sports  Weight lifting limit 25.lb  No heavy pushing activity  Gradually increase strength   NO UPPER BODY ERGOMETER  Optimize functional use of operative UE to patient specific goals  Gradual increase in deltoid, scapular muscle and rotator cuff strength  Pain-free functional activities Light hand weights for deltoid up to and not to exceed 3 lbs for anterior and posterior with long arm lift  against gravity; elbow bent to 90 deg for abduction in scapular plane  Band progression for extension to hip with scapular depression/retraction  Band progression for serratus anterior punches in supine; avoid wall, incline or prone press-ups for serratus anterior  End-range stretching gently without forceful overpressure in all planes (elevation in scapular plane, ER in scapular plane, functional IR) with stretching done for life as part of daily routine  NO UPPER BODY ERGOMETER  Pain-free AROM for shoulder elevation (expect around 135-150 deg)  Functional strength for all ADLs, work tasks, and hobbies approved by Designer, television/film set with home maintenance program PROM for elevation, ER(0); ER(90)  AROM for elevation, ER(0) and functional IR  Scapulohumeral rhythm/biomechanics of active movement strategies  Strength testing for deltoid, RTC, scapular muscles  Patient reported outcome measure  Pain

## 2024-02-02 ENCOUNTER — Ambulatory Visit

## 2024-02-02 DIAGNOSIS — R293 Abnormal posture: Secondary | ICD-10-CM

## 2024-02-02 DIAGNOSIS — M6281 Muscle weakness (generalized): Secondary | ICD-10-CM

## 2024-02-02 DIAGNOSIS — M25511 Pain in right shoulder: Secondary | ICD-10-CM | POA: Diagnosis not present

## 2024-02-06 NOTE — Therapy (Unsigned)
 OUTPATIENT PHYSICAL THERAPY TREATMENT NOTE   Patient Name: [name] MRN: [id number] DOB:1941/05/09, 83 y.o., female Today's Date: 02/07/2024  END OF SESSION:     PT End of Session - 02/07/24 1537     Visit Number 10    Number of Visits 16    Date for PT Re-Evaluation 03/01/24    Authorization Type [employer] M[employer]    Authorization Time Period VL Medical Necessity    Progress Note Due on Visit 10    PT Start Time 1530    PT Stop Time 1610    PT Time Calculation (min) 40 min    Activity Tolerance Patient tolerated treatment well    Behavior During Therapy [facility] for tasks assessed/performed          Past Medical History:  Diagnosis Date   Anemia    PMH;  Patient denies this dx as of 06/03/21   Anxiety    Asthma    Breast cancer (HCC) 1988   left breast cancer   CHF (congestive heart failure) (HCC)    Hx of Acute  HF   Collagenous colitis    Compression fracture    lumbar 1   Dyspnea    with exertion   Heart murmur    Hypertension    Hypothyroidism    Osteoarthritis    Osteopenia    Pneumonia 07/2020   PONV (postoperative nausea and vomiting)    Sleep apnea    wears CPAP nightly   Past Surgical History:  Procedure Laterality Date   BREAST REDUCTION SURGERY     Right   CATARACT EXTRACTION W/ INTRAOCULAR LENS  IMPLANT, BILATERAL     CESAREAN SECTION     COLONOSCOPY W/ BIOPSIES AND POLYPECTOMY     CYST EXCISION N/A 08/30/2018   Procedure: excision of posterior shoulder / back sebaceous cyst;  Surgeon: Lowery Estefana RAMAN, DO;  Location: [facility];  Service: Plastics;  Laterality: N/A;   KNEE ARTHROSCOPY Left 02/23/2022   Procedure: LEFT KNEE ARTHROSCOPY;  Surgeon: Sheril Coy, MD;  Location: [facility];  Service: Orthopedics;  Laterality: Left;   KYPHOPLASTY N/A 02/18/2016   Procedure: LUMBAR 1 KYPHOPLASTY;  Surgeon: Oneil Priestly, MD;  Location: [facility];  Service: Orthopedics;  Laterality: N/A;  LUMBAR 1 KYPHOPLASTY   KYPHOPLASTY N/A  06/04/2021   Procedure: LUMBAR 2 KYPHOPLASTY;  Surgeon: Priestly Oneil, MD;  Location: [facility];  Service: Orthopedics;  Laterality: N/A;   KYPHOPLASTY N/A 03/31/2023   Procedure: LUMBAR 4 KYPHOPLASTY;  Surgeon: Priestly Oneil, MD;  Location: [facility];  Service: Orthopedics;  Laterality: N/A;   LAPAROSCOPIC APPENDECTOMY N/A 12/09/2021   Procedure: APPENDECTOMY LAPAROSCOPIC;  Surgeon: Teresa Lonni HERO, MD;  Location: [facility];  Service: General;  Laterality: N/A;   LUMBAR DISC SURGERY     MODIFIED RADICAL MASTECTOMY W/ AXILLARY LYMPH NODE DISSECTION     Left   RECONSTRUCTION BREAST W/ LATISSIMUS DORSI FLAP     left   REDUCTION MAMMAPLASTY Right    REVERSE SHOULDER ARTHROPLASTY Right 12/15/2023   Procedure: ARTHROPLASTY, SHOULDER, TOTAL, REVERSE;  Surgeon: Dozier Soulier, MD;  Location: [facility];  Service: Orthopedics;  Laterality: Right;   right foot surgery     corrected hammer toe, straightened big toe   SEPTOPLASTY     TOE SURGERY     TONSILLECTOMY AND ADENOIDECTOMY     Patient Active Problem List   Diagnosis Date Noted   S/P reverse total shoulder arthroplasty, right 12/15/2023   Acute bronchitis with  asthma 04/12/2023   Acute appendicitis 12/09/2021   S/P laparoscopic appendectomy 12/09/2021   CAP (community acquired pneumonia) 09/30/2020   Chronic respiratory failure with hypoxia (HCC) 09/23/2020   Allergic rhinitis 08/31/2019   Pilar cyst 08/04/2018   History of breast cancer in female 06/09/2018   History of reconstruction of both breasts 06/09/2018   Severe obesity (BMI >= 40) (HCC) 09/16/2015   Asthma with acute exacerbation 09/15/2015   DOE (dyspnea on exertion) 01/11/2014   Insomnia 05/05/2012   Osteoarthritis    Osteopenia    Collagenous colitis    OSA (obstructive sleep apnea)    Hypothyroidism    Mild persistent asthma    Hypertension    Breast cancer, left breast (HCC)     PCP: Jacques Camie Pepper, PA-C  REFERRING PROVIDER: Dozier Soulier, MD   REFERRING  DIAG: S/P reverse total shoulder arthroplasty, right [Z96.611]   THERAPY DIAG:  Right shoulder pain, unspecified chronicity  Muscle weakness (generalized)  Abnormal posture  Rationale for Evaluation and Treatment: Rehabilitation  ONSET DATE: 12/15/2023 DOS  SUBJECTIVE:                                                                                                                                                                                      SUBJECTIVE STATEMENT: Awakes with stiff and sore shoulder, 3-4/10 in intensity.  Resolves mid day and then returns at day end due to fatigue.  Hand dominance: Right  PERTINENT HISTORY: Relevant PMHx includes anxiety, L breast cancer, acute HF, Lumbar compression fx, DOE, HTN< OA, oteopenia, hypothyroidisma, sleep apnea, R breast reduction procedure  PAIN:  Are you having pain? Yes: NPRS scale: 0/10 current, 5/10 with ADLs  Pain location: R shoulder Pain description: aching  Aggravating factors: ADLs (getting dressed) Relieving factors: prescribed pain medicine, OTC tylenol   PRECAUTIONS: Shoulder  RED FLAGS: None   WEIGHT BEARING RESTRICTIONS: No  FALLS:  Has patient fallen in last 6 months? No  LIVING ENVIRONMENT: Lives with: lives with their family and lives with their spouse Lives in: House/apartment Stairs: No; besides 2-steps to laundry room  Has following equipment at home: Single point cane, Environmental consultant - 4 wheeled, Wheelchair (manual), shower chair, and Grab bars  OCCUPATION: N/a  PLOF: Independent with basic ADLs, Independent with household mobility with device, and Independent with community mobility with device  PATIENT GOALS: To be able to perform toileting and grooming tasks independently.   NEXT MD VISIT: July 28  OBJECTIVE:  Note: Objective measures were completed at Evaluation unless otherwise noted.  DIAGNOSTIC FINDINGS:  11/23/2023: R shoulder CT   IMPRESSION: 1. Severe osteoarthritis of the right  glenohumeral joint. 2. Complete supraspinatus  tendon tear. Mild supraspinatus muscle atrophy. Moderate infraspinatus muscle atrophy.  PATIENT SURVEYS:  Quick DASH: 75 02/07/24 30/55 43% perceived disability  COGNITION: Overall cognitive status: Within functional limits for tasks assessed  POSTURE: Mildly rounded shoulders and forward head   UPPER EXTREMITY ROM:   Passive ROM Right eval Left eval R 02/07/24  Shoulder flexion A 115 P 90 120d A/P  Shoulder extension     Shoulder abduction A 150 P 65 90d A/P  Shoulder adduction     Shoulder internal rotation     Shoulder external rotation     Elbow flexion     Elbow extension     Wrist flexion     Wrist extension     Wrist ulnar deviation     Wrist radial deviation     Wrist pronation     Wrist supination     (Blank rows = not tested)  UPPER EXTREMITY MMT: deferred until appropriate d/t post op status   MMT Right eval Left eval R 02/07/24  Shoulder flexion   3-  Shoulder extension     Shoulder abduction   3-  Shoulder adduction     Shoulder internal rotation   3+  Shoulder external rotation   2  Middle trapezius     Lower trapezius     Elbow flexion     Elbow extension     Wrist flexion     Wrist extension     Wrist ulnar deviation     Wrist radial deviation     Wrist pronation     Wrist supination     Grip strength (lbs) 15 lb avg 30 lb avg 28#  (Blank rows = not tested)                                                                                                                             TREATMENT DATE:  [facility] Adult PT Treatment:                                                DATE: 02/07/24 Therapeutic Exercise: OH pulley 5 min Seated shrugs 15x2 Seated retractions 15x2 Seated ER RTB 15x2  Seated chest press RTB 15x2 Seated ER (with towel roll) 15x2 UE Ranger Neuromuscular re-ed: Seated OH press 500g ball 15x2 (90d) Seated abd elbow extended 15x2 (in scapular plane) Supine flexion with stick  15x Supine press with stick 15x SELF CARE:  Assessment of progress towards goals, HEP compliance and qDASH retake  [facility] Adult PT Treatment:                                                DATE: 02/02/24 Therapeutic Exercise:  OH pulley 5 min Seated shrugs 15x  Seated retractions 15x  Seated ER (with towel roll) 15x2 UE Ranger Seated chest press YTB 15x2 Manual Therapy: Rhytmic stabilization at 90d flexion 30s  PROM increased to ~120d flexion Neuromuscular re-ed: Seated OH press 500g ball 15x2 (90d) Seated ER YTB 15x Seated abd elbow extended 15x2 (in scapular plane) Supine flexion with stick 15x2 Supine press with stick 15x2    [facility] Adult PT Treatment:                                                DATE: 01/31/24 Therapeutic Exercise: OH pulley 5 min Seated shrugs 15x  Seated retractions 15x  Seated ER (with towel roll) 15x2 UE Ranger Seated chest press YTB 15x2 Manual Therapy: Rhytmic stabilization at 90d flexion 30s  PROM limited today by guarding/pain Neuromuscular re-ed: Seated OH press 500g ball 15x2 (90d) Seated ER YTB 15x Seated abd elbow extended 15x2 (in scapular plane) Supine flexion with stick 15x2 Supine press with stick 15x2  [facility] Adult PT Treatment:                                                DATE: 01/26/24 Therapeutic Exercise: OH pulley 5 min Seated shrugs 15x 2s Seated retractions 15x 2s Seated ER (with towel roll) 15x2 UE Ranger Manual Therapy: Rhytmic stabilization at 90d flexion 30s  PROM limited today by guarding/pain Neuromuscular re-ed: Seated OH press 500g ball 15x2 (90d) Seated ER YTB 15x Seated abd elbow flexed 15x2  Supine flexion with stick 15x2 Supine press with stick 15x2 [facility] Adult PT Treatment:                                                DATE: 01/24/24 Therapeutic Exercise: OH pulley 5 min Seated shrugs 15x Seated retractions 15x Manual Therapy: PROM 40d ER, 90d flexion, 80d abd Rhythmic stab at 90d flexion Neuromuscular  re-ed: OH press 500g ball 15x2 (90d) Seated ER YTB 15x Seated abd elbow flexed 15x2       [facility] Adult PT Treatment:                                                DATE: 01/05/2024   Initial evaluation: see patient education and home exercise program as noted below     PATIENT EDUCATION: Education details: reviewed initial home exercise program; discussion of POC, prognosis and goals for skilled PT   Person educated: Patient Education method: Explanation, Demonstration, and Handouts Education comprehension: verbalized understanding, returned demonstration, and needs further education  HOME EXERCISE PROGRAM: Access Code: [email or web address] URL: [email or web address] Date: 01/26/2024 Prepared by: Reyes Kohut  Exercises - Seated Single Arm Overhead Elbow Extension with Dumbbell   - 2 x daily - 7 x weekly - 3 sets - 10 reps - Supine Shoulder Flexion Extension AAROM with Dowel  - 2 x daily - 7 x weekly - 3 sets - 10 reps - Seated Shoulder External Rotation  AAROM with Dowel  - 2 x daily - 7 x weekly - 3 sets - 10 reps  ASSESSMENT:  CLINICAL IMPRESSION:  10th visit progress note.  Pain and discomfort have subsided, perceived disability has decreased from 75% to 43% and reaching function has improved.  Discomfort level at 3-4/10.  Can now reach the back of her head in the shower.  ROM of R shoulder updated.   [name] is a 83 y.o. female who was seen today for physical therapy evaluation and treatment for R shoulder pain and mobility deficits 3 weeks s/p Right reverse TSA. She is demonstrating diminished R shoulder PROM, decreased postural endurance, and decreased R grip strength. She has related pain and difficulty with ADLs/IADLs, including dressing, grooming, and bed mobility. She requires skilled PT services at this time to address relevant deficits and improve overall independent function.     OBJECTIVE IMPAIRMENTS: decreased activity tolerance, decreased ROM, decreased  strength, impaired UE functional use, improper body mechanics, postural dysfunction, and pain.   ACTIVITY LIMITATIONS: carrying, lifting, sleeping, transfers, bed mobility, bathing, toileting, dressing, self feeding, reach over head, and hygiene/grooming  PARTICIPATION LIMITATIONS: meal prep, cleaning, laundry, and community activity  PERSONAL FACTORS: Age, Past/current experiences, and 3+ comorbidities: Relevant PMHx includes anxiety, L breast cancer, acute HF, Lumbar compression fx, DOE, HTN< OA, oteopenia, hypothyroidisma, sleep apnea, R breast reduction procedure are also affecting patient's functional outcome.   REHAB POTENTIAL: Fair    CLINICAL DECISION MAKING: Evolving/moderate complexity  EVALUATION COMPLEXITY: Moderate   GOALS: Goals reviewed with patient? YES  SHORT TERM GOALS: Target date: 02/02/2024   Patient will be independent with initial home program at least 3 days/week.  Baseline: provided at eval Goal Status: MET  2.  Patient will demonstrate improved postural awareness for at least 15 minutes while seated without need for cueing from PT.  Baseline: see objective measures Goal Status: INITIAL   3.  Patient will demonstrate improved shoulder AROM to at least 90 degrees of flexion and abduction.   Baseline: PROM 90 flexion, 65 abduction at eval  Goal status: INITIAL   LONG TERM GOALS: Target date: 03/01/2024   Patient will report improved overall functional ability with Quick DASH score of 40 or less.  Baseline: 75 Goal Status: INITIAL  2.  Patient will demonstrate improved R grip strength to at least 25lb, average of 3 trials.  Baseline: 15lb, compared to 30lb on left; 02/07/24 28# Goal status: Met  3.  Patient will demonstrate improved functional IR AROM/reaching in order to perform toileting tasks independently.   Baseline: unable Goal status: INITIAL  4.  Patient will demonstrate improved functional ER/AROM/reaching in order to perform grooming/hair  styling tasks independently.    Baseline: unable; 02/07/24 Able to reach back of head today in shower. Goal status: Ongoing    PLAN:  PT FREQUENCY: 1-2x/week  PT DURATION: 8 weeks  PLANNED INTERVENTIONS: 97164- PT Re-evaluation, 97750- Physical Performance Testing, 97110-Therapeutic exercises, 97530- Therapeutic activity, [id number]- Neuromuscular re-education, 97535- Self Care, [id number]- Manual therapy, [id number]- Aquatic Therapy, 97016- Vasopneumatic device, Patient/Family education, Cryotherapy, and Moist heat  PLAN FOR NEXT SESSION: address shoulder ROM and otherwise progress as appopriate for post op status   Jeff Pearlena Ow PT  02/07/2024 5:07 PM   REVERSE SHOULDER ARTHROPLASTY REHABILITATION GUIDELINES These guidelines do NOT apply to RTSA for proximal humeral fracture - rehabilitation following PHFx will commence 4-6 weeks after surgery as deemed appropriate to protect tuberosity healing PHASE PRECAUTIONS AND GUIDELINES GOALS EXERCISES CRITERIA TO ADVANCE  EXAMINATION  1 (Post-operative day 1 to post-operative week 3)       PHASE PRECAUTIONS AND GUIDELINES GOALS EXERCISES CRITERIA TO ADVANCE  EXAMINATION  2 (Post-operative week 3 to 6) May discontinue sling use at 3 weeks; after 2 weeks can remove the sling at home and just use the sling at night and in community for 3rd week  May use arm for basic activities of daily living (such as feeding, brushing teeth, dressing.)   May submerge in water  after 4 weeks  Avoid combined IR/EXT/ADD (hand behind the back) and IR/ADD (reaching across chest) for dislocation precautions  Avoid acromial or scapular spine pain as increase deltoid loading - decrease load if this occurs  Elevation to 130 deg and ER to 30 deg - passive, active assisted or active  Low (less than 3/10) to no pain  Ability to fire all heads of the deltoid May discontinue grip, and active elbow and wrist exercises since using the arm in ADLs with sling removed around the  home  Continue elevation to 130 and ER to 30, both in scapular plane   Submaximal isometrics (pain-free effort) for all functional heads of deltoid (anterior, posterior, middle).   Active exercise as able:  Supine forward punch  Place in balanced position with circumduction and progressive arcs in sagittal plane  Side-lying abduction to 90   Lateral raise with bent elbow   Prone extension to hip  Elevation in scapular plane to 130; ER in scapular plane to 30   Ability to fire isometrically all heads of the deltoid muscle without pain  Ability to place and hold the arm in balanced position (90 deg elevation in supine) Wound assessment  Neurovascular assessment  Swelling assessment  ROM shoulder elevation and ER(0)  Patient reported outcome measure  Pain level    PHASE PRECAUTIONS AND GUIDELINES GOALS EXERCISES CRITERIA TO ADVANCE  EXAMINATION  3 (Post-operative week 6 to 12) Avoid forceful end-range motion in any direction   Progress active use of the arm in ADLs without being restricted to arm by the side of the body;   No heavy lifting or carrying  Initiate functional IR behind the back gently without forceful overpressure  Avoid acromial or scapular spine pain as increase deltoid loading - decrease load if this occurs  NO UPPER BODY ERGOMETER  Optimize ROM for elevation and ER in scapular plane   Expected PROM: Elevation - 145-160; ER - 40-50 ; functional IR to L1  Recover AROM to approach as close to PROM available as possible  Establish dynamic stability of the shoulder  Forward elevation in scapular plane active progression: supine to incline to vertical; short to long lever arm  Lateral raise with bent elbow; side-lying abduction  Active ER/IR with arm at side  Scapular retraction with light band resistance  Serratus anterior punches in supine; avoid wall, incline or prone press-ups for serratus anterior  Functional IR with hand slide up back - very  gentle and gradual   AROM equals/approaches PROM with good mechanics for elevation  No pain  Higher level demand on shoulder than ADL functions PROM for elevation, ER(0)  AROM for elevation, ER(0) and functional IR  Patient reported outcome measure  Pain             PHASE PRECAUTIONS AND GUIDELINES GOALS EXERCISES CRITERIA TO ADVANCE  EXAMINATION  4 (Post-operative week 12+) No heavy lifting and no overhead sports  Weight lifting limit 25.lb  No heavy pushing activity  Gradually increase strength  NO UPPER BODY ERGOMETER  Optimize functional use of operative UE to patient specific goals  Gradual increase in deltoid, scapular muscle and rotator cuff strength  Pain-free functional activities Light hand weights for deltoid up to and not to exceed 3 lbs for anterior and posterior with long arm lift against gravity; elbow bent to 90 deg for abduction in scapular plane  Band progression for extension to hip with scapular depression/retraction  Band progression for serratus anterior punches in supine; avoid wall, incline or prone press-ups for serratus anterior  End-range stretching gently without forceful overpressure in all planes (elevation in scapular plane, ER in scapular plane, functional IR) with stretching done for life as part of daily routine  NO UPPER BODY ERGOMETER  Pain-free AROM for shoulder elevation (expect around 135-150 deg)  Functional strength for all ADLs, work tasks, and hobbies approved by Designer, television/film set with home maintenance program PROM for elevation, ER(0); ER(90)  AROM for elevation, ER(0) and functional IR  Scapulohumeral rhythm/biomechanics of active movement strategies  Strength testing for deltoid, RTC, scapular muscles  Patient reported outcome measure  Pain

## 2024-02-07 ENCOUNTER — Ambulatory Visit: Attending: Orthopedic Surgery

## 2024-02-07 DIAGNOSIS — M25511 Pain in right shoulder: Secondary | ICD-10-CM | POA: Insufficient documentation

## 2024-02-07 DIAGNOSIS — M6281 Muscle weakness (generalized): Secondary | ICD-10-CM | POA: Insufficient documentation

## 2024-02-07 DIAGNOSIS — R293 Abnormal posture: Secondary | ICD-10-CM | POA: Diagnosis present

## 2024-02-08 NOTE — Therapy (Unsigned)
 OUTPATIENT PHYSICAL THERAPY TREATMENT NOTE   Patient Name: [name] MRN: [id number] DOB:09/04/1940, 83 y.o., female Today's Date: 02/09/2024  END OF SESSION:     PT End of Session - 02/09/24 1328     Visit Number 11    Number of Visits 16    Date for PT Re-Evaluation 03/01/24    Authorization Type [employer] M[employer]    Authorization Time Period VL Medical Necessity    Progress Note Due on Visit 10    PT Start Time 1315    PT Stop Time 1355    PT Time Calculation (min) 40 min    Activity Tolerance Patient tolerated treatment well    Behavior During Therapy WFL for tasks assessed/performed           Past Medical History:  Diagnosis Date   Anemia    PMH;  Patient denies this dx as of 06/03/21   Anxiety    Asthma    Breast cancer (HCC) 1988   left breast cancer   CHF (congestive heart failure) (HCC)    Hx of Acute  HF   Collagenous colitis    Compression fracture    lumbar 1   Dyspnea    with exertion   Heart murmur    Hypertension    Hypothyroidism    Osteoarthritis    Osteopenia    Pneumonia 07/2020   PONV (postoperative nausea and vomiting)    Sleep apnea    wears CPAP nightly   Past Surgical History:  Procedure Laterality Date   BREAST REDUCTION SURGERY     Right   CATARACT EXTRACTION W/ INTRAOCULAR LENS  IMPLANT, BILATERAL     CESAREAN SECTION     COLONOSCOPY W/ BIOPSIES AND POLYPECTOMY     CYST EXCISION N/A 08/30/2018   Procedure: excision of posterior shoulder / back sebaceous cyst;  Surgeon: Lowery Estefana RAMAN, DO;  Location: [facility];  Service: Plastics;  Laterality: N/A;   KNEE ARTHROSCOPY Left 02/23/2022   Procedure: LEFT KNEE ARTHROSCOPY;  Surgeon: Sheril Coy, MD;  Location: [facility];  Service: Orthopedics;  Laterality: Left;   KYPHOPLASTY N/A 02/18/2016   Procedure: LUMBAR 1 KYPHOPLASTY;  Surgeon: Oneil Priestly, MD;  Location: [facility];  Service: Orthopedics;  Laterality: N/A;  LUMBAR 1 KYPHOPLASTY   KYPHOPLASTY N/A  06/04/2021   Procedure: LUMBAR 2 KYPHOPLASTY;  Surgeon: Priestly Oneil, MD;  Location: [facility];  Service: Orthopedics;  Laterality: N/A;   KYPHOPLASTY N/A 03/31/2023   Procedure: LUMBAR 4 KYPHOPLASTY;  Surgeon: Priestly Oneil, MD;  Location: [facility];  Service: Orthopedics;  Laterality: N/A;   LAPAROSCOPIC APPENDECTOMY N/A 12/09/2021   Procedure: APPENDECTOMY LAPAROSCOPIC;  Surgeon: Teresa Lonni HERO, MD;  Location: [facility];  Service: General;  Laterality: N/A;   LUMBAR DISC SURGERY     MODIFIED RADICAL MASTECTOMY W/ AXILLARY LYMPH NODE DISSECTION     Left   RECONSTRUCTION BREAST W/ LATISSIMUS DORSI FLAP     left   REDUCTION MAMMAPLASTY Right    REVERSE SHOULDER ARTHROPLASTY Right 12/15/2023   Procedure: ARTHROPLASTY, SHOULDER, TOTAL, REVERSE;  Surgeon: Dozier Soulier, MD;  Location: [facility];  Service: Orthopedics;  Laterality: Right;   right foot surgery     corrected hammer toe, straightened big toe   SEPTOPLASTY     TOE SURGERY     TONSILLECTOMY AND ADENOIDECTOMY     Patient Active Problem List   Diagnosis Date Noted   S/P reverse total shoulder arthroplasty, right 12/15/2023   Acute bronchitis  with asthma 04/12/2023   Acute appendicitis 12/09/2021   S/P laparoscopic appendectomy 12/09/2021   CAP (community acquired pneumonia) 09/30/2020   Chronic respiratory failure with hypoxia (HCC) 09/23/2020   Allergic rhinitis 08/31/2019   Pilar cyst 08/04/2018   History of breast cancer in female 06/09/2018   History of reconstruction of both breasts 06/09/2018   Severe obesity (BMI >= 40) (HCC) 09/16/2015   Asthma with acute exacerbation 09/15/2015   DOE (dyspnea on exertion) 01/11/2014   Insomnia 05/05/2012   Osteoarthritis    Osteopenia    Collagenous colitis    OSA (obstructive sleep apnea)    Hypothyroidism    Mild persistent asthma    Hypertension    Breast cancer, left breast (HCC)     PCP: Jacques Camie Pepper, PA-C  REFERRING PROVIDER: Dozier Soulier, MD   REFERRING  DIAG: S/P reverse total shoulder arthroplasty, right [Z96.611]   THERAPY DIAG:  Right shoulder pain, unspecified chronicity  Muscle weakness (generalized)  Abnormal posture  Rationale for Evaluation and Treatment: Rehabilitation  ONSET DATE: 12/15/2023 DOS  SUBJECTIVE:                                                                                                                                                                                      SUBJECTIVE STATEMENT: No stiffness following lasts session  Hand dominance: Right  PERTINENT HISTORY: Relevant PMHx includes anxiety, L breast cancer, acute HF, Lumbar compression fx, DOE, HTN< OA, oteopenia, hypothyroidisma, sleep apnea, R breast reduction procedure  PAIN:  Are you having pain? Yes: NPRS scale: 0/10 current, 5/10 with ADLs  Pain location: R shoulder Pain description: aching  Aggravating factors: ADLs (getting dressed) Relieving factors: prescribed pain medicine, OTC tylenol   PRECAUTIONS: Shoulder  RED FLAGS: None   WEIGHT BEARING RESTRICTIONS: No  FALLS:  Has patient fallen in last 6 months? No  LIVING ENVIRONMENT: Lives with: lives with their family and lives with their spouse Lives in: House/apartment Stairs: No; besides 2-steps to laundry room  Has following equipment at home: Single point cane, Environmental consultant - 4 wheeled, Wheelchair (manual), shower chair, and Grab bars  OCCUPATION: N/a  PLOF: Independent with basic ADLs, Independent with household mobility with device, and Independent with community mobility with device  PATIENT GOALS: To be able to perform toileting and grooming tasks independently.   NEXT MD VISIT: July 28  OBJECTIVE:  Note: Objective measures were completed at Evaluation unless otherwise noted.  DIAGNOSTIC FINDINGS:  11/23/2023: R shoulder CT   IMPRESSION: 1. Severe osteoarthritis of the right glenohumeral joint. 2. Complete supraspinatus tendon tear. Mild supraspinatus  muscle atrophy. Moderate infraspinatus muscle atrophy.  PATIENT SURVEYS:  Quick DASH:  75 02/07/24 30/55 43% perceived disability  COGNITION: Overall cognitive status: Within functional limits for tasks assessed  POSTURE: Mildly rounded shoulders and forward head   UPPER EXTREMITY ROM:   Passive ROM Right eval Left eval R 02/07/24  Shoulder flexion A 115 P 90 120d A/P  Shoulder extension     Shoulder abduction A 150 P 65 90d A/P  Shoulder adduction     Shoulder internal rotation     Shoulder external rotation     Elbow flexion     Elbow extension     Wrist flexion     Wrist extension     Wrist ulnar deviation     Wrist radial deviation     Wrist pronation     Wrist supination     (Blank rows = not tested)  UPPER EXTREMITY MMT: deferred until appropriate d/t post op status   MMT Right eval Left eval R 02/07/24  Shoulder flexion   3-  Shoulder extension     Shoulder abduction   3-  Shoulder adduction     Shoulder internal rotation   3+  Shoulder external rotation   2  Middle trapezius     Lower trapezius     Elbow flexion     Elbow extension     Wrist flexion     Wrist extension     Wrist ulnar deviation     Wrist radial deviation     Wrist pronation     Wrist supination     Grip strength (lbs) 15 lb avg 30 lb avg 28#  (Blank rows = not tested)                                                                                                                             TREATMENT DATE: [facility] Adult PT Treatment:                                                DATE: 02/09/24 Therapeutic Exercise: OH pulley 5 min Seated shrugs 15x Seated retractions 15x Seated ER YTB 15x2  Seated rows YTB 15x2 Neuromuscular re-ed: Seated scaption 500g ball 15x2 Seated OH press 1000g ball  Therapeutic Activity: Seated ER UE Ranger w/towel 15x2  Supine flexion 500g ball 15x  [facility] Adult PT Treatment:                                                DATE: 02/07/24 Therapeutic  Exercise: OH pulley 5 min Seated shrugs 15x2 Seated retractions 15x2 Seated ER RTB 15x2  Seated chest press RTB 15x2 Seated ER (with towel roll) 15x2 UE Ranger Neuromuscular re-ed: Seated OH press 500g ball 15x2 (90d) Seated abd  elbow extended 15x2 (in scapular plane) Supine flexion with stick 15x Supine press with stick 15x SELF CARE:  Assessment of progress towards goals, HEP compliance and qDASH retake  [facility] Adult PT Treatment:                                                DATE: 02/02/24 Therapeutic Exercise: OH pulley 5 min Seated shrugs 15x  Seated retractions 15x  Seated ER (with towel roll) 15x2 UE Ranger Seated chest press YTB 15x2 Manual Therapy: Rhytmic stabilization at 90d flexion 30s  PROM increased to ~120d flexion Neuromuscular re-ed: Seated OH press 500g ball 15x2 (90d) Seated ER YTB 15x Seated abd elbow extended 15x2 (in scapular plane) Supine flexion with stick 15x2 Supine press with stick 15x2    [facility] Adult PT Treatment:                                                DATE: 01/31/24 Therapeutic Exercise: OH pulley 5 min Seated shrugs 15x  Seated retractions 15x  Seated ER (with towel roll) 15x2 UE Ranger Seated chest press YTB 15x2 Manual Therapy: Rhytmic stabilization at 90d flexion 30s  PROM limited today by guarding/pain Neuromuscular re-ed: Seated OH press 500g ball 15x2 (90d) Seated ER YTB 15x Seated abd elbow extended 15x2 (in scapular plane) Supine flexion with stick 15x2 Supine press with stick 15x2  [facility] Adult PT Treatment:                                                DATE: 01/26/24 Therapeutic Exercise: OH pulley 5 min Seated shrugs 15x 2s Seated retractions 15x 2s Seated ER (with towel roll) 15x2 UE Ranger Manual Therapy: Rhytmic stabilization at 90d flexion 30s  PROM limited today by guarding/pain Neuromuscular re-ed: Seated OH press 500g ball 15x2 (90d) Seated ER YTB 15x Seated abd elbow flexed 15x2  Supine flexion with  stick 15x2 Supine press with stick 15x2 [facility] Adult PT Treatment:                                                DATE: 01/24/24 Therapeutic Exercise: OH pulley 5 min Seated shrugs 15x Seated retractions 15x Manual Therapy: PROM 40d ER, 90d flexion, 80d abd Rhythmic stab at 90d flexion Neuromuscular re-ed: OH press 500g ball 15x2 (90d) Seated ER YTB 15x Seated abd elbow flexed 15x2       [facility] Adult PT Treatment:                                                DATE: 01/05/2024   Initial evaluation: see patient education and home exercise program as noted below     PATIENT EDUCATION: Education details: reviewed initial home exercise program; discussion of POC, prognosis and goals for skilled PT   Person educated: Patient Education  method: Explanation, Demonstration, and Handouts Education comprehension: verbalized understanding, returned demonstration, and needs further education  HOME EXERCISE PROGRAM: Access Code: [email or web address] URL: [email or web address] Date: 02/09/2024 Prepared by: Reyes Kohut  Exercises - Seated Single Arm Overhead Elbow Extension with Dumbbell   - 2 x daily - 7 x weekly - 3 sets - 10 reps - Supine Shoulder Flexion Extension AAROM with Dowel  - 2 x daily - 7 x weekly - 3 sets - 10 reps - Seated Shoulder External Rotation AAROM with Dowel  - 2 x daily - 7 x weekly - 3 sets - 10 reps - Standing Row with Anchored Resistance  - 1 x daily - 5 x weekly - 2 sets - 15 reps - Shoulder External Rotation and Scapular Retraction with Resistance  - 1 x daily - 5 x weekly - 2 sets - 15 reps  ASSESSMENT:  CLINICAL IMPRESSION:  Introduced additional weight and resistance to tasks.  Able to lift increased weights nearly as high a lesser weights into scaption and OH press.  Emphasized mobility today with improved scapular control noted in rows and supine press.  No added discomfort  reported following PT sessions.   [name] is a 83 y.o. female who was seen  today for physical therapy evaluation and treatment for R shoulder pain and mobility deficits 3 weeks s/p Right reverse TSA. She is demonstrating diminished R shoulder PROM, decreased postural endurance, and decreased R grip strength. She has related pain and difficulty with ADLs/IADLs, including dressing, grooming, and bed mobility. She requires skilled PT services at this time to address relevant deficits and improve overall independent function.     OBJECTIVE IMPAIRMENTS: decreased activity tolerance, decreased ROM, decreased strength, impaired UE functional use, improper body mechanics, postural dysfunction, and pain.   ACTIVITY LIMITATIONS: carrying, lifting, sleeping, transfers, bed mobility, bathing, toileting, dressing, self feeding, reach over head, and hygiene/grooming  PARTICIPATION LIMITATIONS: meal prep, cleaning, laundry, and community activity  PERSONAL FACTORS: Age, Past/current experiences, and 3+ comorbidities: Relevant PMHx includes anxiety, L breast cancer, acute HF, Lumbar compression fx, DOE, HTN< OA, oteopenia, hypothyroidisma, sleep apnea, R breast reduction procedure are also affecting patient's functional outcome.   REHAB POTENTIAL: Fair    CLINICAL DECISION MAKING: Evolving/moderate complexity  EVALUATION COMPLEXITY: Moderate   GOALS: Goals reviewed with patient? YES  SHORT TERM GOALS: Target date: 02/02/2024   Patient will be independent with initial home program at least 3 days/week.  Baseline: provided at eval Goal Status: MET  2.  Patient will demonstrate improved postural awareness for at least 15 minutes while seated without need for cueing from PT.  Baseline: see objective measures Goal Status: INITIAL   3.  Patient will demonstrate improved shoulder AROM to at least 90 degrees of flexion and abduction.   Baseline: PROM 90 flexion, 65 abduction at eval  Goal status: INITIAL   LONG TERM GOALS: Target date: 03/01/2024   Patient will report  improved overall functional ability with Quick DASH score of 40 or less.  Baseline: 75 Goal Status: INITIAL  2.  Patient will demonstrate improved R grip strength to at least 25lb, average of 3 trials.  Baseline: 15lb, compared to 30lb on left; 02/07/24 28# Goal status: Met  3.  Patient will demonstrate improved functional IR AROM/reaching in order to perform toileting tasks independently.   Baseline: unable Goal status: INITIAL  4.  Patient will demonstrate improved functional ER/AROM/reaching in order to perform grooming/hair styling tasks independently.    Baseline: unable; 02/07/24 Able to  reach back of head today in shower. Goal status: Ongoing    PLAN:  PT FREQUENCY: 1-2x/week  PT DURATION: 8 weeks  PLANNED INTERVENTIONS: 97164- PT Re-evaluation, 97750- Physical Performance Testing, 97110-Therapeutic exercises, 97530- Therapeutic activity, [id number]- Neuromuscular re-education, 97535- Self Care, [id number]- Manual therapy, [phone]- Aquatic Therapy, 97016- Vasopneumatic device, Patient/Family education, Cryotherapy, and Moist heat  PLAN FOR NEXT SESSION: address shoulder ROM and otherwise progress as appopriate for post op status   Jeff Yousof Alderman PT  02/09/2024 3:06 PM   REVERSE SHOULDER ARTHROPLASTY REHABILITATION GUIDELINES These guidelines do NOT apply to RTSA for proximal humeral fracture - rehabilitation following PHFx will commence 4-6 weeks after surgery as deemed appropriate to protect tuberosity healing PHASE PRECAUTIONS AND GUIDELINES GOALS EXERCISES CRITERIA TO ADVANCE  EXAMINATION  1 (Post-operative day 1 to post-operative week 3)       PHASE PRECAUTIONS AND GUIDELINES GOALS EXERCISES CRITERIA TO ADVANCE  EXAMINATION  2 (Post-operative week 3 to 6) May discontinue sling use at 3 weeks; after 2 weeks can remove the sling at home and just use the sling at night and in community for 3rd week  May use arm for basic activities of daily living (such as feeding, brushing teeth,  dressing.)   May submerge in water  after 4 weeks  Avoid combined IR/EXT/ADD (hand behind the back) and IR/ADD (reaching across chest) for dislocation precautions  Avoid acromial or scapular spine pain as increase deltoid loading - decrease load if this occurs  Elevation to 130 deg and ER to 30 deg - passive, active assisted or active  Low (less than 3/10) to no pain  Ability to fire all heads of the deltoid May discontinue grip, and active elbow and wrist exercises since using the arm in ADLs with sling removed around the home  Continue elevation to 130 and ER to 30, both in scapular plane   Submaximal isometrics (pain-free effort) for all functional heads of deltoid (anterior, posterior, middle).   Active exercise as able:  Supine forward punch  Place in balanced position with circumduction and progressive arcs in sagittal plane  Side-lying abduction to 90   Lateral raise with bent elbow   Prone extension to hip  Elevation in scapular plane to 130; ER in scapular plane to 30   Ability to fire isometrically all heads of the deltoid muscle without pain  Ability to place and hold the arm in balanced position (90 deg elevation in supine) Wound assessment  Neurovascular assessment  Swelling assessment  ROM shoulder elevation and ER(0)  Patient reported outcome measure  Pain level    PHASE PRECAUTIONS AND GUIDELINES GOALS EXERCISES CRITERIA TO ADVANCE  EXAMINATION  3 (Post-operative week 6 to 12) Avoid forceful end-range motion in any direction   Progress active use of the arm in ADLs without being restricted to arm by the side of the body;   No heavy lifting or carrying  Initiate functional IR behind the back gently without forceful overpressure  Avoid acromial or scapular spine pain as increase deltoid loading - decrease load if this occurs  NO UPPER BODY ERGOMETER  Optimize ROM for elevation and ER in scapular plane   Expected PROM: Elevation - 145-160; ER -  40-50 ; functional IR to L1  Recover AROM to approach as close to PROM available as possible  Establish dynamic stability of the shoulder  Forward elevation in scapular plane active progression: supine to incline to vertical; short to long lever arm  Lateral raise with bent elbow; side-lying abduction  Active ER/IR with arm at side  Scapular retraction with light band resistance  Serratus anterior punches in supine; avoid wall, incline or prone press-ups for serratus anterior  Functional IR with hand slide up back - very gentle and gradual   AROM equals/approaches PROM with good mechanics for elevation  No pain  Higher level demand on shoulder than ADL functions PROM for elevation, ER(0)  AROM for elevation, ER(0) and functional IR  Patient reported outcome measure  Pain             PHASE PRECAUTIONS AND GUIDELINES GOALS EXERCISES CRITERIA TO ADVANCE  EXAMINATION  4 (Post-operative week 12+) No heavy lifting and no overhead sports  Weight lifting limit 25.lb  No heavy pushing activity  Gradually increase strength   NO UPPER BODY ERGOMETER  Optimize functional use of operative UE to patient specific goals  Gradual increase in deltoid, scapular muscle and rotator cuff strength  Pain-free functional activities Light hand weights for deltoid up to and not to exceed 3 lbs for anterior and posterior with long arm lift against gravity; elbow bent to 90 deg for abduction in scapular plane  Band progression for extension to hip with scapular depression/retraction  Band progression for serratus anterior punches in supine; avoid wall, incline or prone press-ups for serratus anterior  End-range stretching gently without forceful overpressure in all planes (elevation in scapular plane, ER in scapular plane, functional IR) with stretching done for life as part of daily routine  NO UPPER BODY ERGOMETER  Pain-free AROM for shoulder elevation (expect around 135-150  deg)  Functional strength for all ADLs, work tasks, and hobbies approved by Designer, television/film set with home maintenance program PROM for elevation, ER(0); ER(90)  AROM for elevation, ER(0) and functional IR  Scapulohumeral rhythm/biomechanics of active movement strategies  Strength testing for deltoid, RTC, scapular muscles  Patient reported outcome measure  Pain

## 2024-02-09 ENCOUNTER — Ambulatory Visit

## 2024-02-09 DIAGNOSIS — M25511 Pain in right shoulder: Secondary | ICD-10-CM | POA: Diagnosis not present

## 2024-02-09 DIAGNOSIS — M6281 Muscle weakness (generalized): Secondary | ICD-10-CM

## 2024-02-09 DIAGNOSIS — R293 Abnormal posture: Secondary | ICD-10-CM

## 2024-02-13 NOTE — Therapy (Addendum)
 OUTPATIENT PHYSICAL THERAPY TREATMENT NOTE/DISCHARGE SUMMARY   Patient Name: [name] MRN: [id number] DOB:1940-08-25, 83 y.o., female Today's Date: 04/16/2024  PHYSICAL THERAPY DISCHARGE SUMMARY  Visits from Start of Care: 12  Current functional level related to goals / functional outcomes: Goals partially met   Remaining deficits: Pain, weakness   Education / Equipment: HEP   Patient agrees to discharge. Patient goals were partially met. Patient is being discharged due to a change in medical status.  END OF SESSION:         Past Medical History:  Diagnosis Date   Anemia    PMH;  Patient denies this dx as of 06/03/21   Anxiety    Asthma    Breast cancer (HCC) 1988   left breast cancer   CHF (congestive heart failure) (HCC)    Hx of Acute  HF   Collagenous colitis    Compression fracture    lumbar 1   Dyspnea    with exertion   Heart murmur    Hypertension    Hypothyroidism    Osteoarthritis    Osteopenia    Pneumonia 07/2020   PONV (postoperative nausea and vomiting)    Sleep apnea    wears CPAP nightly   Past Surgical History:  Procedure Laterality Date   BREAST REDUCTION SURGERY     Right   CATARACT EXTRACTION W/ INTRAOCULAR LENS  IMPLANT, BILATERAL     CESAREAN SECTION     COLONOSCOPY W/ BIOPSIES AND POLYPECTOMY     CYST EXCISION N/A 08/30/2018   Procedure: excision of posterior shoulder / back sebaceous cyst;  Surgeon: Lowery Estefana RAMAN, DO;  Location: [facility];  Service: Plastics;  Laterality: N/A;   KNEE ARTHROSCOPY Left 02/23/2022   Procedure: LEFT KNEE ARTHROSCOPY;  Surgeon: Sheril Coy, MD;  Location: [facility];  Service: Orthopedics;  Laterality: Left;   KYPHOPLASTY N/A 02/18/2016   Procedure: LUMBAR 1 KYPHOPLASTY;  Surgeon: Oneil Priestly, MD;  Location: [facility];  Service: Orthopedics;  Laterality: N/A;  LUMBAR 1 KYPHOPLASTY   KYPHOPLASTY N/A 06/04/2021   Procedure: LUMBAR 2 KYPHOPLASTY;  Surgeon: Priestly Oneil, MD;  Location: [facility];  Service: Orthopedics;  Laterality: N/A;   KYPHOPLASTY N/A 03/31/2023   Procedure: LUMBAR 4 KYPHOPLASTY;  Surgeon: Priestly Oneil, MD;  Location: [facility];  Service: Orthopedics;  Laterality: N/A;   LAPAROSCOPIC APPENDECTOMY N/A 12/09/2021   Procedure: APPENDECTOMY LAPAROSCOPIC;  Surgeon: Teresa Lonni HERO, MD;  Location: [facility];  Service: General;  Laterality: N/A;   LUMBAR DISC SURGERY     MODIFIED RADICAL MASTECTOMY W/ AXILLARY LYMPH NODE DISSECTION     Left   RECONSTRUCTION BREAST W/ LATISSIMUS DORSI FLAP     left   REDUCTION MAMMAPLASTY Right    REVERSE SHOULDER ARTHROPLASTY Right 12/15/2023   Procedure: ARTHROPLASTY, SHOULDER, TOTAL, REVERSE;  Surgeon: Dozier Soulier, MD;  Location: [facility];  Service: Orthopedics;  Laterality: Right;   right foot surgery     corrected hammer toe, straightened big toe   SEPTOPLASTY     TOE SURGERY     TONSILLECTOMY AND ADENOIDECTOMY     Patient Active Problem List   Diagnosis Date Noted   S/P reverse total shoulder arthroplasty, right 12/15/2023   Acute bronchitis with asthma 04/12/2023   Acute appendicitis 12/09/2021   S/P laparoscopic appendectomy 12/09/2021   CAP (community acquired pneumonia) 09/30/2020   Chronic respiratory failure with hypoxia (HCC) 09/23/2020   Allergic rhinitis 08/31/2019   Pilar cyst 08/04/2018  History of breast cancer in female 06/09/2018   History of reconstruction of both breasts 06/09/2018   Severe obesity (BMI >= 40) (HCC) 09/16/2015   Asthma with acute exacerbation 09/15/2015   DOE (dyspnea on exertion) 01/11/2014   Insomnia 05/05/2012   Osteoarthritis    Osteopenia    Collagenous colitis    OSA (obstructive sleep apnea)    Hypothyroidism    Mild persistent asthma    Hypertension    Breast cancer, left breast (HCC)     PCP: Jacques Camie Pepper, PA-C  REFERRING PROVIDER: Dozier Soulier, MD   REFERRING DIAG: S/P reverse total shoulder arthroplasty, right [Z96.611]    THERAPY DIAG:  Right shoulder pain, unspecified chronicity  Muscle weakness (generalized)  Abnormal posture  Rationale for Evaluation and Treatment: Rehabilitation  ONSET DATE: 12/15/2023 DOS  SUBJECTIVE:                                                                                                                                                                                      SUBJECTIVE STATEMENT: Developed some L posterior shoulder discomfort following last session which she feels is related to a mastectomy many years ago involving a reconstructive muscle flap using her LD muscle.  Hand dominance: Right  PERTINENT HISTORY: Relevant PMHx includes anxiety, L breast cancer, acute HF, Lumbar compression fx, DOE, HTN< OA, oteopenia, hypothyroidisma, sleep apnea, R breast reduction procedure  PAIN:  Are you having pain? Yes: NPRS scale: 0/10 current, 5/10 with ADLs  Pain location: R shoulder Pain description: aching  Aggravating factors: ADLs (getting dressed) Relieving factors: prescribed pain medicine, OTC tylenol   PRECAUTIONS: Shoulder  RED FLAGS: None   WEIGHT BEARING RESTRICTIONS: No  FALLS:  Has patient fallen in last 6 months? No  LIVING ENVIRONMENT: Lives with: lives with their family and lives with their spouse Lives in: House/apartment Stairs: No; besides 2-steps to laundry room  Has following equipment at home: Single point cane, Environmental consultant - 4 wheeled, Wheelchair (manual), shower chair, and Grab bars  OCCUPATION: N/a  PLOF: Independent with basic ADLs, Independent with household mobility with device, and Independent with community mobility with device  PATIENT GOALS: To be able to perform toileting and grooming tasks independently.   NEXT MD VISIT: July 28  OBJECTIVE:  Note: Objective measures were completed at Evaluation unless otherwise noted.  DIAGNOSTIC FINDINGS:  11/23/2023: R shoulder CT   IMPRESSION: 1. Severe osteoarthritis of the right  glenohumeral joint. 2. Complete supraspinatus tendon tear. Mild supraspinatus muscle atrophy. Moderate infraspinatus muscle atrophy.  PATIENT SURVEYS:  Quick DASH: 75 02/07/24 30/55 43% perceived disability  COGNITION: Overall cognitive status: Within functional limits for tasks assessed  POSTURE: Mildly rounded shoulders and forward head   UPPER EXTREMITY ROM:   Passive ROM Right eval Left eval R 02/07/24  Shoulder flexion A 115 P 90 120d A/P  Shoulder extension     Shoulder abduction A 150 P 65 90d A/P  Shoulder adduction     Shoulder internal rotation     Shoulder external rotation     Elbow flexion     Elbow extension     Wrist flexion     Wrist extension     Wrist ulnar deviation     Wrist radial deviation     Wrist pronation     Wrist supination     (Blank rows = not tested)  UPPER EXTREMITY MMT: deferred until appropriate d/t post op status   MMT Right eval Left eval R 02/07/24  Shoulder flexion   3-  Shoulder extension     Shoulder abduction   3-  Shoulder adduction     Shoulder internal rotation   3+  Shoulder external rotation   2  Middle trapezius     Lower trapezius     Elbow flexion     Elbow extension     Wrist flexion     Wrist extension     Wrist ulnar deviation     Wrist radial deviation     Wrist pronation     Wrist supination     Grip strength (lbs) 15 lb avg 30 lb avg 28#  (Blank rows = not tested)                                                                                                                             TREATMENT DATE: [facility] Adult PT Treatment:                                                DATE: 02/14/24 Therapeutic Exercise: OH pulley 5 min Seated shrugs 15x Seated retractions 15x Seated ER YTB 15x2  Seated rows YTB 15x2 Neuromuscular re-ed: Seated scaption 500g (limited by pain) Seated OH press 1000g ball (limited by pain) Palpation and assessment to identify active TP in R anterior deltoid Therapeutic  Activity: Seated ER UE Ranger w/towel 15x2  Supine flexion 500g ball 15x Supine press UE Ranger 15x2   [facility] Adult PT Treatment:                                                DATE: 02/09/24 Therapeutic Exercise: OH pulley 5 min Seated shrugs 15x Seated retractions 15x Seated ER YTB 15x2  Seated rows YTB 15x2 Neuromuscular re-ed: Seated scaption 500g ball 15x2 Seated OH press 1000g ball  Therapeutic Activity: Seated ER UE  Ranger w/towel 15x2  Supine flexion 500g ball 15x  [facility] Adult PT Treatment:                                                DATE: 02/07/24 Therapeutic Exercise: OH pulley 5 min Seated shrugs 15x2 Seated retractions 15x2 Seated ER RTB 15x2  Seated chest press RTB 15x2 Seated ER (with towel roll) 15x2 UE Ranger Neuromuscular re-ed: Seated OH press 500g ball 15x2 (90d) Seated abd elbow extended 15x2 (in scapular plane) Supine flexion with stick 15x Supine press with stick 15x SELF CARE:  Assessment of progress towards goals, HEP compliance and qDASH retake  [facility] Adult PT Treatment:                                                DATE: 02/02/24 Therapeutic Exercise: OH pulley 5 min Seated shrugs 15x  Seated retractions 15x  Seated ER (with towel roll) 15x2 UE Ranger Seated chest press YTB 15x2 Manual Therapy: Rhytmic stabilization at 90d flexion 30s  PROM increased to ~120d flexion Neuromuscular re-ed: Seated OH press 500g ball 15x2 (90d) Seated ER YTB 15x Seated abd elbow extended 15x2 (in scapular plane) Supine flexion with stick 15x2 Supine press with stick 15x2    [facility] Adult PT Treatment:                                                DATE: 01/31/24 Therapeutic Exercise: OH pulley 5 min Seated shrugs 15x  Seated retractions 15x  Seated ER (with towel roll) 15x2 UE Ranger Seated chest press YTB 15x2 Manual Therapy: Rhytmic stabilization at 90d flexion 30s  PROM limited today by guarding/pain Neuromuscular re-ed: Seated OH press 500g ball 15x2  (90d) Seated ER YTB 15x Seated abd elbow extended 15x2 (in scapular plane) Supine flexion with stick 15x2 Supine press with stick 15x2  [facility] Adult PT Treatment:                                                DATE: 01/26/24 Therapeutic Exercise: OH pulley 5 min Seated shrugs 15x 2s Seated retractions 15x 2s Seated ER (with towel roll) 15x2 UE Ranger Manual Therapy: Rhytmic stabilization at 90d flexion 30s  PROM limited today by guarding/pain Neuromuscular re-ed: Seated OH press 500g ball 15x2 (90d) Seated ER YTB 15x Seated abd elbow flexed 15x2  Supine flexion with stick 15x2 Supine press with stick 15x2 [facility] Adult PT Treatment:                                                DATE: 01/24/24 Therapeutic Exercise: OH pulley 5 min Seated shrugs 15x Seated retractions 15x Manual Therapy: PROM 40d ER, 90d flexion, 80d abd Rhythmic stab at 90d flexion Neuromuscular re-ed: OH press 500g ball 15x2 (90d) Seated ER YTB 15x Seated abd  elbow flexed 15x2       [facility] Adult PT Treatment:                                                DATE: 01/05/2024   Initial evaluation: see patient education and home exercise program as noted below     PATIENT EDUCATION: Education details: reviewed initial home exercise program; discussion of POC, prognosis and goals for skilled PT   Person educated: Patient Education method: Explanation, Demonstration, and Handouts Education comprehension: verbalized understanding, returned demonstration, and needs further education  HOME EXERCISE PROGRAM: Access Code: [email or web address] URL: [email or web address] Date: 02/09/2024 Prepared by: Reyes Kohut  Exercises - Seated Single Arm Overhead Elbow Extension with Dumbbell   - 2 x daily - 7 x weekly - 3 sets - 10 reps - Supine Shoulder Flexion Extension AAROM with Dowel  - 2 x daily - 7 x weekly - 3 sets - 10 reps - Seated Shoulder External Rotation AAROM with Dowel  - 2 x daily - 7 x weekly - 3 sets  - 10 reps - Standing Row with Anchored Resistance  - 1 x daily - 5 x weekly - 2 sets - 15 reps - Shoulder External Rotation and Scapular Retraction with Resistance  - 1 x daily - 5 x weekly - 2 sets - 15 reps  ASSESSMENT:  CLINICAL IMPRESSION:  Returns with elevated anterior shoulder pain in R shoulder which limited participation in session.  Assessment of symptoms finds probable active TP in R anterior deltoid.  Treatment altered to accommodate symptoms and instructions provided for at home remedy.  Instructed to refrain from HEP until next PT session.   Felica is a 83 y.o. female who was seen today for physical therapy evaluation and treatment for R shoulder pain and mobility deficits 3 weeks s/p Right reverse TSA. She is demonstrating diminished R shoulder PROM, decreased postural endurance, and decreased R grip strength. She has related pain and difficulty with ADLs/IADLs, including dressing, grooming, and bed mobility. She requires skilled PT services at this time to address relevant deficits and improve overall independent function.     OBJECTIVE IMPAIRMENTS: decreased activity tolerance, decreased ROM, decreased strength, impaired UE functional use, improper body mechanics, postural dysfunction, and pain.   ACTIVITY LIMITATIONS: carrying, lifting, sleeping, transfers, bed mobility, bathing, toileting, dressing, self feeding, reach over head, and hygiene/grooming  PARTICIPATION LIMITATIONS: meal prep, cleaning, laundry, and community activity  PERSONAL FACTORS: Age, Past/current experiences, and 3+ comorbidities: Relevant PMHx includes anxiety, L breast cancer, acute HF, Lumbar compression fx, DOE, HTN< OA, oteopenia, hypothyroidisma, sleep apnea, R breast reduction procedure are also affecting patient's functional outcome.   REHAB POTENTIAL: Fair    CLINICAL DECISION MAKING: Evolving/moderate complexity  EVALUATION COMPLEXITY: Moderate   GOALS: Goals reviewed with patient?  YES  SHORT TERM GOALS: Target date: 02/02/2024   Patient will be independent with initial home program at least 3 days/week.  Baseline: provided at eval Goal Status: MET  2.  Patient will demonstrate improved postural awareness for at least 15 minutes while seated without need for cueing from PT.  Baseline: see objective measures Goal Status: INITIAL   3.  Patient will demonstrate improved shoulder AROM to at least 90 degrees of flexion and abduction.   Baseline: PROM 90 flexion, 65 abduction at eval  Goal status: INITIAL   LONG TERM GOALS:  Target date: 03/01/2024   Patient will report improved overall functional ability with Quick DASH score of 40 or less.  Baseline: 75 Goal Status: INITIAL  2.  Patient will demonstrate improved R grip strength to at least 25lb, average of 3 trials.  Baseline: 15lb, compared to 30lb on left; 02/07/24 28# Goal status: Met  3.  Patient will demonstrate improved functional IR AROM/reaching in order to perform toileting tasks independently.   Baseline: unable Goal status: INITIAL  4.  Patient will demonstrate improved functional ER/AROM/reaching in order to perform grooming/hair styling tasks independently.    Baseline: unable; 02/07/24 Able to reach back of head today in shower. Goal status: Ongoing    PLAN:  PT FREQUENCY: 1-2x/week  PT DURATION: 8 weeks  PLANNED INTERVENTIONS: 97164- PT Re-evaluation, 97750- Physical Performance Testing, 97110-Therapeutic exercises, 97530- Therapeutic activity, 97112- Neuromuscular re-education, 97535- Self Care, [id number]- Manual therapy, [phone]- Aquatic Therapy, 97016- Vasopneumatic device, Patient/Family education, Cryotherapy, and Moist heat  PLAN FOR NEXT SESSION: address shoulder ROM and otherwise progress as appopriate for post op status   Jeff Shontell Prosser PT  04/16/2024 9:55 AM   REVERSE SHOULDER ARTHROPLASTY REHABILITATION GUIDELINES These guidelines do NOT apply to RTSA for proximal humeral fracture -  rehabilitation following PHFx will commence 4-6 weeks after surgery as deemed appropriate to protect tuberosity healing PHASE PRECAUTIONS AND GUIDELINES GOALS EXERCISES CRITERIA TO ADVANCE  EXAMINATION  1 (Post-operative day 1 to post-operative week 3)       PHASE PRECAUTIONS AND GUIDELINES GOALS EXERCISES CRITERIA TO ADVANCE  EXAMINATION  2 (Post-operative week 3 to 6) May discontinue sling use at 3 weeks; after 2 weeks can remove the sling at home and just use the sling at night and in community for 3rd week  May use arm for basic activities of daily living (such as feeding, brushing teeth, dressing.)   May submerge in water  after 4 weeks  Avoid combined IR/EXT/ADD (hand behind the back) and IR/ADD (reaching across chest) for dislocation precautions  Avoid acromial or scapular spine pain as increase deltoid loading - decrease load if this occurs  Elevation to 130 deg and ER to 30 deg - passive, active assisted or active  Low (less than 3/10) to no pain  Ability to fire all heads of the deltoid May discontinue grip, and active elbow and wrist exercises since using the arm in ADLs with sling removed around the home  Continue elevation to 130 and ER to 30, both in scapular plane   Submaximal isometrics (pain-free effort) for all functional heads of deltoid (anterior, posterior, middle).   Active exercise as able:  Supine forward punch  Place in balanced position with circumduction and progressive arcs in sagittal plane  Side-lying abduction to 90   Lateral raise with bent elbow   Prone extension to hip  Elevation in scapular plane to 130; ER in scapular plane to 30   Ability to fire isometrically all heads of the deltoid muscle without pain  Ability to place and hold the arm in balanced position (90 deg elevation in supine) Wound assessment  Neurovascular assessment  Swelling assessment  ROM shoulder elevation and ER(0)  Patient reported outcome measure  Pain level     PHASE PRECAUTIONS AND GUIDELINES GOALS EXERCISES CRITERIA TO ADVANCE  EXAMINATION  3 (Post-operative week 6 to 12) Avoid forceful end-range motion in any direction   Progress active use of the arm in ADLs without being restricted to arm by the side of the body;   No heavy  lifting or carrying  Initiate functional IR behind the back gently without forceful overpressure  Avoid acromial or scapular spine pain as increase deltoid loading - decrease load if this occurs  NO UPPER BODY ERGOMETER  Optimize ROM for elevation and ER in scapular plane   Expected PROM: Elevation - 145-160; ER - 40-50 ; functional IR to L1  Recover AROM to approach as close to PROM available as possible  Establish dynamic stability of the shoulder  Forward elevation in scapular plane active progression: supine to incline to vertical; short to long lever arm  Lateral raise with bent elbow; side-lying abduction  Active ER/IR with arm at side  Scapular retraction with light band resistance  Serratus anterior punches in supine; avoid wall, incline or prone press-ups for serratus anterior  Functional IR with hand slide up back - very gentle and gradual   AROM equals/approaches PROM with good mechanics for elevation  No pain  Higher level demand on shoulder than ADL functions PROM for elevation, ER(0)  AROM for elevation, ER(0) and functional IR  Patient reported outcome measure  Pain             PHASE PRECAUTIONS AND GUIDELINES GOALS EXERCISES CRITERIA TO ADVANCE  EXAMINATION  4 (Post-operative week 12+) No heavy lifting and no overhead sports  Weight lifting limit 25.lb  No heavy pushing activity  Gradually increase strength   NO UPPER BODY ERGOMETER  Optimize functional use of operative UE to patient specific goals  Gradual increase in deltoid, scapular muscle and rotator cuff strength  Pain-free functional activities Light hand weights for deltoid up to and not to exceed 3 lbs for  anterior and posterior with long arm lift against gravity; elbow bent to 90 deg for abduction in scapular plane  Band progression for extension to hip with scapular depression/retraction  Band progression for serratus anterior punches in supine; avoid wall, incline or prone press-ups for serratus anterior  End-range stretching gently without forceful overpressure in all planes (elevation in scapular plane, ER in scapular plane, functional IR) with stretching done for life as part of daily routine  NO UPPER BODY ERGOMETER  Pain-free AROM for shoulder elevation (expect around 135-150 deg)  Functional strength for all ADLs, work tasks, and hobbies approved by Designer, television/film set with home maintenance program PROM for elevation, ER(0); ER(90)  AROM for elevation, ER(0) and functional IR  Scapulohumeral rhythm/biomechanics of active movement strategies  Strength testing for deltoid, RTC, scapular muscles  Patient reported outcome measure  Pain

## 2024-02-14 ENCOUNTER — Ambulatory Visit

## 2024-02-14 DIAGNOSIS — M25511 Pain in right shoulder: Secondary | ICD-10-CM | POA: Diagnosis not present

## 2024-02-14 DIAGNOSIS — M6281 Muscle weakness (generalized): Secondary | ICD-10-CM

## 2024-02-14 DIAGNOSIS — R293 Abnormal posture: Secondary | ICD-10-CM

## 2024-02-16 ENCOUNTER — Encounter: Payer: Self-pay | Admitting: Orthopedic Surgery

## 2024-02-16 ENCOUNTER — Ambulatory Visit

## 2024-02-16 ENCOUNTER — Other Ambulatory Visit: Payer: Self-pay | Admitting: Orthopedic Surgery

## 2024-02-16 DIAGNOSIS — M25111 Fistula, right shoulder: Secondary | ICD-10-CM

## 2024-02-17 ENCOUNTER — Ambulatory Visit
Admission: RE | Admit: 2024-02-17 | Discharge: 2024-02-17 | Disposition: A | Discharge status: Home / Self Care | Source: Ambulatory Visit | Attending: Orthopedic Surgery | Admitting: Orthopedic Surgery

## 2024-02-17 DIAGNOSIS — M25111 Fistula, right shoulder: Secondary | ICD-10-CM

## 2024-02-21 ENCOUNTER — Encounter

## 2024-02-23 ENCOUNTER — Ambulatory Visit

## 2024-03-01 ENCOUNTER — Other Ambulatory Visit: Payer: Self-pay | Admitting: Obstetrics and Gynecology

## 2024-03-01 DIAGNOSIS — L98499 Non-pressure chronic ulcer of skin of other sites with unspecified severity: Secondary | ICD-10-CM

## 2024-03-02 ENCOUNTER — Other Ambulatory Visit: Payer: Self-pay | Admitting: Obstetrics and Gynecology

## 2024-03-02 DIAGNOSIS — C50919 Malignant neoplasm of unspecified site of unspecified female breast: Secondary | ICD-10-CM

## 2024-03-07 ENCOUNTER — Other Ambulatory Visit: Payer: Self-pay | Admitting: Obstetrics and Gynecology

## 2024-03-07 ENCOUNTER — Ambulatory Visit
Admission: RE | Admit: 2024-03-07 | Discharge: 2024-03-07 | Disposition: A | Discharge status: Home / Self Care | Source: Ambulatory Visit | Attending: Obstetrics and Gynecology

## 2024-03-07 ENCOUNTER — Ambulatory Visit
Admission: RE | Admit: 2024-03-07 | Discharge: 2024-03-07 | Disposition: A | Discharge status: Home / Self Care | Source: Ambulatory Visit | Attending: Obstetrics and Gynecology | Admitting: Obstetrics and Gynecology

## 2024-03-07 DIAGNOSIS — L98499 Non-pressure chronic ulcer of skin of other sites with unspecified severity: Secondary | ICD-10-CM

## 2024-03-19 ENCOUNTER — Other Ambulatory Visit

## 2024-03-28 ENCOUNTER — Other Ambulatory Visit: Payer: Self-pay | Admitting: Surgery

## 2024-03-28 ENCOUNTER — Other Ambulatory Visit: Payer: Self-pay | Admitting: Obstetrics and Gynecology

## 2024-03-28 DIAGNOSIS — S21102A Unspecified open wound of left front wall of thorax without penetration into thoracic cavity, initial encounter: Secondary | ICD-10-CM

## 2024-03-28 DIAGNOSIS — Z853 Personal history of malignant neoplasm of breast: Secondary | ICD-10-CM

## 2024-04-03 ENCOUNTER — Encounter: Payer: Self-pay | Admitting: Surgery

## 2024-04-11 ENCOUNTER — Other Ambulatory Visit (HOSPITAL_COMMUNITY): Payer: Self-pay | Admitting: Surgery

## 2024-04-11 ENCOUNTER — Ambulatory Visit
Admission: RE | Admit: 2024-04-11 | Discharge: 2024-04-11 | Disposition: A | Source: Ambulatory Visit | Attending: Obstetrics and Gynecology

## 2024-04-11 DIAGNOSIS — S21102A Unspecified open wound of left front wall of thorax without penetration into thoracic cavity, initial encounter: Secondary | ICD-10-CM

## 2024-04-11 DIAGNOSIS — Z853 Personal history of malignant neoplasm of breast: Secondary | ICD-10-CM

## 2024-04-17 ENCOUNTER — Ambulatory Visit (HOSPITAL_COMMUNITY)
Admission: RE | Admit: 2024-04-17 | Discharge: 2024-04-17 | Disposition: A | Discharge status: Home / Self Care | Source: Ambulatory Visit | Attending: Surgery | Admitting: Surgery

## 2024-04-17 DIAGNOSIS — Z853 Personal history of malignant neoplasm of breast: Secondary | ICD-10-CM | POA: Diagnosis present

## 2024-04-17 DIAGNOSIS — S21102A Unspecified open wound of left front wall of thorax without penetration into thoracic cavity, initial encounter: Secondary | ICD-10-CM | POA: Diagnosis present

## 2024-04-17 MED ORDER — GADOBUTROL 1 MMOL/ML IV SOLN
10.0000 mL | Freq: Once | INTRAVENOUS | Status: AC | PRN
Start: 2024-04-17 — End: 2024-04-17
  Administered 2024-04-17: 10 mL via INTRAVENOUS

## 2024-05-02 ENCOUNTER — Ambulatory Visit: Admitting: Plastic Surgery

## 2024-05-02 VITALS — BP 166/73 | HR 67 | Temp 97.6°F | Ht 59.0 in | Wt 225.0 lb

## 2024-05-02 DIAGNOSIS — Z9012 Acquired absence of left breast and nipple: Secondary | ICD-10-CM | POA: Diagnosis not present

## 2024-05-02 DIAGNOSIS — Z853 Personal history of malignant neoplasm of breast: Secondary | ICD-10-CM

## 2024-05-02 DIAGNOSIS — Z9889 Other specified postprocedural states: Secondary | ICD-10-CM

## 2024-05-02 DIAGNOSIS — N61 Mastitis without abscess: Secondary | ICD-10-CM

## 2024-05-02 DIAGNOSIS — L539 Erythematous condition, unspecified: Secondary | ICD-10-CM

## 2024-05-02 DIAGNOSIS — C50012 Malignant neoplasm of nipple and areola, left female breast: Secondary | ICD-10-CM

## 2024-05-02 MED ORDER — AMOXICILLIN-POT CLAVULANATE 500-125 MG PO TABS: 1.0000 | ORAL_TABLET | Freq: Three times a day (TID) | ORAL | 0 refills | Status: DC

## 2024-05-02 NOTE — Progress Notes (Signed)
 Referring Provider Jacques Camie Pepper, PA-C [address] [place],  [place] [address]   CC:  Chief Complaint  Patient presents with   consult    Consult poss implant rupture      [name] is an 83 y.o. female.  HPI: [name] is an 83 year old female who is referred for evaluation of the left breast.  Patient has a long and complicated history of breast reconstruction after mastectomy.  Mastectomy was performed sometime in the late 80s and she subsequently had a latissimus flap and then implant placement.  She currently has a silicone implant in place in the left breast.  She saw Dr. Vanderbilt approximately 6 weeks ago regarding erythema of the left breast with an opening on the medial aspect of the breast.  She developed the redness in May of this year and developed what she describes as a blister over the breast.  The blister eventually ruptured and has healed.  She states that the redness which she believes is associated with stress has significantly improved as well.  Not having any pain or discomfort at this time.  An MRI was ordered which did not clearly show any rupture of the implant however dedicated implant images were not obtained.  The MRI did not show any evidence of malignancy and she was read as a BI-RADS 2.  Interestingly she had a similar episode of erythema approximately 6 years ago when she was seen in our office for a similar complaint.  No further workup or intervention was performed at that time.  Patient is not on blood thinners but does have an allergy to sulfa medications.  She specifically denies allergy to penicillin medications  Allergies  Allergen Reactions   Nebivolol  Hcl Other (See Comments)    Hypotensive episode  Bystolic *   Bactrim [Sulfamethoxazole-Trimethoprim] Other (See Comments)    UNSPECIFIED    Dairy Aid [Tilactase] Diarrhea and Other (See Comments)    Ice cream, sour cream   Lactose Intolerance (Gi) Diarrhea   Microplegia  Msa-Msg [Cardioplegia [place] Nido Formula] Other (See Comments)    Unknown reaction    Adhesive [Tape] Rash   Mobic [Meloxicam] Nausea Only   Other Nausea And Vomiting    general anesthesia   Sulfa Antibiotics Rash         Outpatient Encounter Medications as of 05/02/2024  Medication Sig   acetaminophen  (TYLENOL ) 650 MG CR tablet Take 650-1,300 mg by mouth in the morning, at noon, in the evening, and at bedtime.   albuterol  (PROVENTIL ) (2.5 MG/3ML) 0.083% nebulizer solution Take 3 mLs (2.5 mg dose) by nebulization every 6 (six) hours as needed for Wheezing.   albuterol  (VENTOLIN  HFA) 108 (90 Base) MCG/ACT inhaler Inhale 2 puffs into the lungs every 6 (six) hours as needed for wheezing or shortness of breath.   B Complex-C (B-COMPLEX WITH VITAMIN C) tablet Take 1 tablet by mouth in the morning.   betamethasone  dipropionate (DIPROLENE ) 0.05 % ointment Apply 1 application  topically 2 (two) times daily as needed (irritation).   Calcium  Carb-Cholecalciferol  (CALTRATE 600+D3 PO) Take 1 tablet by mouth in the morning, at noon, and at bedtime.   Cholecalciferol  (VITAMIN D3 PO) Take 1 tablet by mouth in the morning.   clobetasol ointment (TEMOVATE) 0.05 % Apply 1 application  topically 2 (two) times daily as needed (irritation (lichen sclerosus)).   denosumab  (PROLIA ) 60 MG/ML SOSY injection Inject 60 mg into the skin every 6 (six) months.   diclofenac sodium (  VOLTAREN) 1 % GEL Apply 1 application  topically 4 (four) times daily as needed (pain.).   diltiazem  (CARDIZEM  CD) 240 MG 24 hr capsule Take 240 mg by mouth in the morning.   diphenhydrAMINE  HCl, Sleep, (ZZZQUIL PO) Take 30 mLs by mouth at bedtime.   famotidine  (PEPCID ) 40 MG tablet Take 40 mg by mouth at bedtime.   fluticasone -salmeterol (ADVAIR ) 100-50 MCG/ACT AEPB Inhale 1 puff into the lungs in the morning and at bedtime.   furosemide  (LASIX ) 40 MG tablet Take 40 mg by mouth in the morning.   hydrALAZINE  (APRESOLINE ) 50 MG tablet Take 1  tablet (50 mg total) by mouth 3 (three) times daily.   levocetirizine (XYZAL) 5 MG tablet Take 5 mg by mouth in the morning and at bedtime.   levothyroxine  (SYNTHROID , LEVOTHROID) 88 MCG tablet Take 88 mcg by mouth daily before breakfast.   losartan  (COZAAR ) 100 MG tablet Take 100 mg by mouth in the morning.   Menthol , Topical Analgesic, (ASPERCREME MAX ROLL-ON) 16 % LIQD Apply 1 Application topically as needed (pain.).   Multiple Vitamins-Minerals (MULTIVITAMIN WITH MINERALS) tablet Take 1 tablet by mouth daily with lunch. Centrum Silver for Women   NON FORMULARY Pt uses a cpap nightly   ondansetron  (ZOFRAN ) 4 MG tablet Take 1 tablet (4 mg total) by mouth every 6 (six) hours as needed for nausea.   ORTHOVISC 30 MG/2ML SOSY Inject 6 mLs into the skin every 6 (six) months.   oxyCODONE  (ROXICODONE ) 5 MG immediate release tablet Take 1 tablet (5 mg total) by mouth every 4 (four) hours as needed.   pantoprazole (PROTONIX) 40 MG tablet Take 40 mg by mouth 2 (two) times daily.   Polyethyl Glycol-Propyl Glycol (SYSTANE) 0.4-0.3 % SOLN Place 1-2 drops into both eyes in the morning and at bedtime.   potassium chloride  SA (KLOR-CON  M20) 20 MEQ tablet TAKE 1 TABLET BY MOUTH 3 TIMES A DAY   sertraline  (ZOLOFT ) 100 MG tablet Take 100 mg by mouth 2 (two) times daily.    tiZANidine  (ZANAFLEX ) 4 MG tablet Take 0.5-1 tablets (2-4 mg total) by mouth every 8 (eight) hours as needed.   Facility-Administered Encounter Medications as of 05/02/2024  Medication   methylPREDNISolone  acetate (DEPO-MEDROL ) injection 80 mg     Past Medical History:  Diagnosis Date   Anemia    PMH;  Patient denies this dx as of 06/03/21   Anxiety    Asthma    Breast cancer (HCC) 1988   left breast cancer   CHF (congestive heart failure) (HCC)    Hx of Acute  HF   Collagenous colitis    Compression fracture    lumbar 1   Dyspnea    with exertion   Heart murmur    Hypertension    Hypothyroidism    Osteoarthritis     Osteopenia    Pneumonia 07/2020   PONV (postoperative nausea and vomiting)    Sleep apnea    wears CPAP nightly    Past Surgical History:  Procedure Laterality Date   BREAST REDUCTION SURGERY     Right   CATARACT EXTRACTION W/ INTRAOCULAR LENS  IMPLANT, BILATERAL     CESAREAN SECTION     COLONOSCOPY W/ BIOPSIES AND POLYPECTOMY     CYST EXCISION N/A 08/30/2018   Procedure: excision of posterior shoulder / back sebaceous cyst;  Surgeon: Lowery Estefana RAMAN, DO;  Location: [facility];  Service: Plastics;  Laterality: N/A;   KNEE ARTHROSCOPY Left 02/23/2022  Procedure: LEFT KNEE ARTHROSCOPY;  Surgeon: Sheril Coy, MD;  Location: [facility];  Service: Orthopedics;  Laterality: Left;   KYPHOPLASTY N/A 02/18/2016   Procedure: LUMBAR 1 KYPHOPLASTY;  Surgeon: Oneil Priestly, MD;  Location: [facility];  Service: Orthopedics;  Laterality: N/A;  LUMBAR 1 KYPHOPLASTY   KYPHOPLASTY N/A 06/04/2021   Procedure: LUMBAR 2 KYPHOPLASTY;  Surgeon: Priestly Oneil, MD;  Location: [facility];  Service: Orthopedics;  Laterality: N/A;   KYPHOPLASTY N/A 03/31/2023   Procedure: LUMBAR 4 KYPHOPLASTY;  Surgeon: Priestly Oneil, MD;  Location: [facility];  Service: Orthopedics;  Laterality: N/A;   LAPAROSCOPIC APPENDECTOMY N/A 12/09/2021   Procedure: APPENDECTOMY LAPAROSCOPIC;  Surgeon: Teresa Lonni HERO, MD;  Location: [facility];  Service: General;  Laterality: N/A;   LUMBAR DISC SURGERY     MODIFIED RADICAL MASTECTOMY W/ AXILLARY LYMPH NODE DISSECTION     Left   RECONSTRUCTION BREAST W/ LATISSIMUS DORSI FLAP     left   REDUCTION MAMMAPLASTY Right    REVERSE SHOULDER ARTHROPLASTY Right 12/15/2023   Procedure: ARTHROPLASTY, SHOULDER, TOTAL, REVERSE;  Surgeon: Dozier Soulier, MD;  Location: [facility];  Service: Orthopedics;  Laterality: Right;   right foot surgery     corrected hammer toe, straightened big toe   SEPTOPLASTY     TOE SURGERY     TONSILLECTOMY AND ADENOIDECTOMY      Family History  Problem  Relation Age of Onset   Lung cancer Mother    Hypertension Mother    Hypertension Father    Ovarian cancer Sister    Hypertension Brother    Angina Brother 56   Breast cancer Maternal Aunt    Brain cancer Maternal Aunt    Kidney cancer Paternal Uncle    Prostate cancer Paternal Uncle    Breast cancer Other        cousin   Ovarian cancer Other        cousin   Kidney cancer Other        cousin    Social History   Social History Narrative   Not on file     Review of Systems General: Denies fevers, chills, weight loss CV: Denies chest pain, shortness of breath, palpitations Left breast: Long history of breast reconstruction after mastectomy.  History of erythema of the breast 6 years ago with recurrence in May of this year.  She states the erythema is improving no pain today  Physical Exam    05/02/2024   11:44 AM 12/16/2023    9:03 AM 12/16/2023    5:11 AM  Vitals with BMI  Height 4' 11    Weight 225 lbs    BMI 45.42    Systolic 166 147 852  Diastolic 73 72 72  Pulse 67  80    General:  No acute distress,  Alert and oriented, Non-Toxic, Normal speech and affect Left breast medially.  There is erythema of the left breast.  There is a skin paddle consistent with a latissimus flap on the lateral aspect of the left breast.  There is a black area on the inferior aspect of the breast which may be consistent with thinning of the skin with the implant visible through the skin.  There is no pain to palpation.  There is no drainage. Mammogram: MRI BI-RADS 2 Assessment/Plan Left breast erythema, reconstruction: Patient has had this erythema in the past and she states that the erythema is now improving.  Interestingly she associates this erythema with stress.  It is entirely possible  that the erythema and a blister represent a herpetic infection.  I am going to start her on antibiotics and have her return in 1 week.  She is interested in removal of the implant.  While she is at some  medical risk I do not think this is unreasonable to help prevent need for emergent removal.  Will further discuss next week when she returns.  Leonce KATHEE Birmingham 05/02/2024, 12:27 PM

## 2024-05-09 ENCOUNTER — Telehealth: Payer: Self-pay | Admitting: Plastic Surgery

## 2024-05-09 ENCOUNTER — Ambulatory Visit: Admitting: Plastic Surgery

## 2024-05-09 ENCOUNTER — Ambulatory Visit: Admitting: Physician Assistant

## 2024-05-09 ENCOUNTER — Other Ambulatory Visit: Payer: Self-pay | Admitting: Plastic Surgery

## 2024-05-09 VITALS — BP 131/72 | HR 78

## 2024-05-09 DIAGNOSIS — T8549XA Other mechanical complication of breast prosthesis and implant, initial encounter: Secondary | ICD-10-CM

## 2024-05-09 MED ORDER — AMOXICILLIN-POT CLAVULANATE 875-125 MG PO TABS: 1.0000 | ORAL_TABLET | Freq: Two times a day (BID) | ORAL | 0 refills | Status: DC

## 2024-05-09 MED ORDER — AMOXICILLIN-POT CLAVULANATE 500-125 MG PO TABS: 1.0000 | ORAL_TABLET | Freq: Three times a day (TID) | ORAL | 0 refills | Status: DC

## 2024-05-09 MED ORDER — OXYCODONE HCL 5 MG PO TABS: 5.0000 mg | ORAL_TABLET | Freq: Three times a day (TID) | ORAL | 0 refills | Status: AC | PRN

## 2024-05-09 MED ORDER — ONDANSETRON 4 MG PO TBDP: 4.0000 mg | ORAL_TABLET | Freq: Three times a day (TID) | ORAL | 0 refills | Status: DC | PRN

## 2024-05-09 NOTE — Telephone Encounter (Signed)
 Patient called to follow up from appointment today because she has surgery scheduled for this Friday 05/11/24 and she wants to know if she can get her Flu shot done tomorrow. Please advise, call back is [phone].

## 2024-05-09 NOTE — Telephone Encounter (Signed)
 Spoke with patient. All set. Thanks.

## 2024-05-09 NOTE — Progress Notes (Signed)
 [name] returns today for evaluation of the left breast.  She now clearly has exposure of the implant.  The area of breakdown is only 2 to 3 mm but the implant is visible through the skin.  She will need to have the implant removed.  While this is not an emergent procedure it is urgent and we will get her on the schedule either tomorrow afternoon or Friday morning.  Will replace her antibiotics and keep her on antibiotics to cover skin flora until the surgery.  We did discuss the risk of bleeding, infection, and seroma formation.  She understands she will have a drain postoperatively.  I will plan to keep her overnight out of an abundance of caution due to her age and underlying medical conditions.  She is in agreement.

## 2024-05-09 NOTE — Progress Notes (Cosign Needed Addendum)
 Patient ID: [name], female    DOB: 09/03/1940, 83 y.o.   MRN: [id number]  Chief Complaint  Patient presents with   Pre-op Exam      ICD-10-CM   1. Extrusion of breast implant, initial encounter  T85.49XA        History of Present Illness: [name] is a 83 y.o.  female  with a history of left breast cancer with unilateral mastectomy implant-based reconstruction.  She presents for preoperative evaluation for upcoming procedure, removal of exposed left breast implant with possible capsulectomy, scheduled for TBD with Dr. Waddell.  The patient has not had problems with anesthesia aside from PONV.  She denies any personal or family history of blood clots or clotting disorder.  She sees Dr. Ladona, cardiology, annually for her hypertension.  EF preserved on most recent echocardiogram.  Patient may continue with her antihypertensives and other medications perioperatively.  Denies any nicotine use.  She does have asthma as well as OSA.  Discussed risks of surgery as well as expectations.  She is understanding and agreeable to proceed.  Given acuity of situation, no clearances will be obtained per Dr. Waddell.  She plans to bring her CPAP to the OR with her.  Summary of Previous Visit: Patient was seen initially on 05/02/2024 for new onset left breast rash and blistering.  Suspected possible herpetic infection, but started her on antibiotics encouraged her to follow-up in 1 week.  She expressed interest in removal of the implant.  However, patient then arrives today and there is a clear implant exposure.  Discussed plan for OR in the next 48 hours with possible capsulectomy and drain placement.  Job: Retired.  PMH Significant for: Left breast cancer with left breast cancer with unilateral mastectomy and implant-based reconstruction including latissimus dorsi flap, contralateral reduction for symmetry, thyroid  disorder, OSA, HTN, asthma, anxiety, shoulder surgery 12/2023.    Past  Medical History: Allergies: Allergies  Allergen Reactions   Nebivolol  Hcl Other (See Comments)    Hypotensive episode  Bystolic *   Bactrim [Sulfamethoxazole-Trimethoprim] Other (See Comments)    UNSPECIFIED    Dairy Aid [Tilactase] Diarrhea and Other (See Comments)    Ice cream, sour cream   Lactose Intolerance (Gi) Diarrhea   Microplegia Msa-Msg [Cardioplegia [place] Nido Formula] Other (See Comments)    Unknown reaction    Adhesive [Tape] Rash   Mobic [Meloxicam] Nausea Only   Other Nausea And Vomiting    general anesthesia   Sulfa Antibiotics Rash         Current Medications:  Current Outpatient Medications:    acetaminophen  (TYLENOL ) 650 MG CR tablet, Take 650-1,300 mg by mouth in the morning, at noon, in the evening, and at bedtime., Disp: , Rfl:    albuterol  (PROVENTIL ) (2.5 MG/3ML) 0.083% nebulizer solution, Take 3 mLs (2.5 mg dose) by nebulization every 6 (six) hours as needed for Wheezing., Disp: 360 mL, Rfl: 0   albuterol  (VENTOLIN  HFA) 108 (90 Base) MCG/ACT inhaler, Inhale 2 puffs into the lungs every 6 (six) hours as needed for wheezing or shortness of breath., Disp: 54 g, Rfl: 3   amoxicillin -clavulanate (AUGMENTIN ) 500-125 MG tablet, Take 1 tablet by mouth 3 (three) times daily for 7 days., Disp: 21 tablet, Rfl: 0   amoxicillin -clavulanate (AUGMENTIN ) 875-125 MG tablet, Take 1 tablet by mouth 2 (two) times daily., Disp: 20 tablet, Rfl: 0   B Complex-C (B-COMPLEX WITH VITAMIN C) tablet, Take 1 tablet by mouth in the morning., Disp: ,  Rfl:    Calcium  Carb-Cholecalciferol  (CALTRATE 600+D3 PO), Take 1 tablet by mouth in the morning, at noon, and at bedtime., Disp: , Rfl:    Cholecalciferol  (VITAMIN D3 PO), Take 1 tablet by mouth in the morning., Disp: , Rfl:    clobetasol ointment (TEMOVATE) 0.05 %, Apply 1 application  topically 2 (two) times daily as needed (irritation (lichen sclerosus))., Disp: , Rfl:    denosumab  (PROLIA ) 60 MG/ML SOSY injection, Inject 60 mg into the  skin every 6 (six) months., Disp: , Rfl:    diclofenac sodium (VOLTAREN) 1 % GEL, Apply 1 application  topically 4 (four) times daily as needed (pain.)., Disp: , Rfl:    diltiazem  (CARDIZEM  CD) 240 MG 24 hr capsule, Take 240 mg by mouth in the morning., Disp: , Rfl:    diphenhydrAMINE  HCl, Sleep, (ZZZQUIL PO), Take 30 mLs by mouth at bedtime., Disp: , Rfl:    famotidine  (PEPCID ) 40 MG tablet, Take 40 mg by mouth at bedtime., Disp: , Rfl:    fluticasone -salmeterol (ADVAIR ) 100-50 MCG/ACT AEPB, Inhale 1 puff into the lungs in the morning and at bedtime., Disp: 60 each, Rfl: 5   furosemide  (LASIX ) 40 MG tablet, Take 40 mg by mouth in the morning., Disp: , Rfl:    hydrALAZINE  (APRESOLINE ) 50 MG tablet, Take 1 tablet (50 mg total) by mouth 3 (three) times daily., Disp: 270 tablet, Rfl: 3   levocetirizine (XYZAL) 5 MG tablet, Take 5 mg by mouth in the morning and at bedtime., Disp: , Rfl:    levothyroxine  (SYNTHROID , LEVOTHROID) 88 MCG tablet, Take 88 mcg by mouth daily before breakfast., Disp: , Rfl:    losartan  (COZAAR ) 100 MG tablet, Take 100 mg by mouth in the morning., Disp: , Rfl:    Menthol , Topical Analgesic, (ASPERCREME MAX ROLL-ON) 16 % LIQD, Apply 1 Application topically as needed (pain.)., Disp: , Rfl:    Multiple Vitamins-Minerals (MULTIVITAMIN WITH MINERALS) tablet, Take 1 tablet by mouth daily with lunch. Centrum Silver for Women, Disp: , Rfl:    NON FORMULARY, Pt uses a cpap nightly, Disp: , Rfl:    ondansetron  (ZOFRAN -ODT) 4 MG disintegrating tablet, Take 1 tablet (4 mg total) by mouth every 8 (eight) hours as needed for nausea or vomiting., Disp: 20 tablet, Rfl: 0   ORTHOVISC 30 MG/2ML SOSY, Inject 6 mLs into the skin every 6 (six) months., Disp: , Rfl:    oxyCODONE  (ROXICODONE ) 5 MG immediate release tablet, Take 1 tablet (5 mg total) by mouth every 8 (eight) hours as needed for up to 5 days for severe pain (pain score 7-10)., Disp: 15 tablet, Rfl: 0   pantoprazole (PROTONIX) 40 MG  tablet, Take 40 mg by mouth 2 (two) times daily., Disp: , Rfl:    Polyethyl Glycol-Propyl Glycol (SYSTANE) 0.4-0.3 % SOLN, Place 1-2 drops into both eyes in the morning and at bedtime., Disp: , Rfl:    potassium chloride  SA (KLOR-CON  M20) 20 MEQ tablet, TAKE 1 TABLET BY MOUTH 3 TIMES A DAY, Disp: 270 tablet, Rfl: 3   sertraline  (ZOLOFT ) 100 MG tablet, Take 100 mg by mouth 2 (two) times daily. , Disp: , Rfl:    betamethasone  dipropionate (DIPROLENE ) 0.05 % ointment, Apply 1 application  topically 2 (two) times daily as needed (irritation)., Disp: , Rfl:    ondansetron  (ZOFRAN ) 4 MG tablet, Take 1 tablet (4 mg total) by mouth every 6 (six) hours as needed for nausea., Disp: 20 tablet, Rfl: 0   oxyCODONE  (ROXICODONE ) 5 MG  immediate release tablet, Take 1 tablet (5 mg total) by mouth every 4 (four) hours as needed., Disp: 30 tablet, Rfl: 0   tiZANidine  (ZANAFLEX ) 4 MG tablet, Take 0.5-1 tablets (2-4 mg total) by mouth every 8 (eight) hours as needed., Disp: 30 tablet, Rfl: 0  Current Facility-Administered Medications:    methylPREDNISolone  acetate (DEPO-MEDROL ) injection 80 mg, 80 mg, Intramuscular, Once, Hope Almarie ORN, NP  Past Medical Problems: Past Medical History:  Diagnosis Date   Anemia    PMH;  Patient denies this dx as of 06/03/21   Anxiety    Asthma    Breast cancer (HCC) 1988   left breast cancer   CHF (congestive heart failure) (HCC)    Hx of Acute  HF   Collagenous colitis    Compression fracture    lumbar 1   Dyspnea    with exertion   Heart murmur    Hypertension    Hypothyroidism    Osteoarthritis    Osteopenia    Pneumonia 07/2020   PONV (postoperative nausea and vomiting)    Sleep apnea    wears CPAP nightly    Past Surgical History: Past Surgical History:  Procedure Laterality Date   BREAST REDUCTION SURGERY     Right   CATARACT EXTRACTION W/ INTRAOCULAR LENS  IMPLANT, BILATERAL     CESAREAN SECTION     COLONOSCOPY W/ BIOPSIES AND POLYPECTOMY     CYST  EXCISION N/A 08/30/2018   Procedure: excision of posterior shoulder / back sebaceous cyst;  Surgeon: Lowery Estefana RAMAN, DO;  Location: [facility];  Service: Plastics;  Laterality: N/A;   KNEE ARTHROSCOPY Left 02/23/2022   Procedure: LEFT KNEE ARTHROSCOPY;  Surgeon: Sheril Coy, MD;  Location: [facility];  Service: Orthopedics;  Laterality: Left;   KYPHOPLASTY N/A 02/18/2016   Procedure: LUMBAR 1 KYPHOPLASTY;  Surgeon: Oneil Priestly, MD;  Location: [facility];  Service: Orthopedics;  Laterality: N/A;  LUMBAR 1 KYPHOPLASTY   KYPHOPLASTY N/A 06/04/2021   Procedure: LUMBAR 2 KYPHOPLASTY;  Surgeon: Priestly Oneil, MD;  Location: [facility];  Service: Orthopedics;  Laterality: N/A;   KYPHOPLASTY N/A 03/31/2023   Procedure: LUMBAR 4 KYPHOPLASTY;  Surgeon: Priestly Oneil, MD;  Location: [facility];  Service: Orthopedics;  Laterality: N/A;   LAPAROSCOPIC APPENDECTOMY N/A 12/09/2021   Procedure: APPENDECTOMY LAPAROSCOPIC;  Surgeon: Teresa Lonni HERO, MD;  Location: [facility];  Service: General;  Laterality: N/A;   LUMBAR DISC SURGERY     MODIFIED RADICAL MASTECTOMY W/ AXILLARY LYMPH NODE DISSECTION     Left   RECONSTRUCTION BREAST W/ LATISSIMUS DORSI FLAP     left   REDUCTION MAMMAPLASTY Right    REVERSE SHOULDER ARTHROPLASTY Right 12/15/2023   Procedure: ARTHROPLASTY, SHOULDER, TOTAL, REVERSE;  Surgeon: Dozier Soulier, MD;  Location: [facility];  Service: Orthopedics;  Laterality: Right;   right foot surgery     corrected hammer toe, straightened big toe   SEPTOPLASTY     TOE SURGERY     TONSILLECTOMY AND ADENOIDECTOMY      Social History: Social History   Socioeconomic History   Marital status: Married    Spouse name: Not on file   Number of children: 1   Years of education: Not on file   Highest education level: Not on file  Occupational History   Occupation: retired  Tobacco Use   Smoking status: Never   Smokeless tobacco: Never  Vaping Use   Vaping status: Never Used  Substance and  Sexual Activity  Alcohol  use: Yes    Alcohol /week: 3.0 - 4.0 standard drinks of alcohol     Types: 3 - 4 Glasses of wine per week    Comment: wine q day   Drug use: No   Sexual activity: Not Currently    Birth control/protection: Post-menopausal  Other Topics Concern   Not on file  Social History Narrative   Not on file   Social Drivers of Health   Financial Resource Strain: Low Risk  (11/20/2023)   Received [facility]   Overall Financial Resource Strain (CARDIA)    Difficulty of Paying Living Expenses: Not hard at all  Food Insecurity: No Food Insecurity (12/15/2023)   Hunger Vital Sign    Worried About Running Out of Food in the Last Year: Never true    Ran Out of Food in the Last Year: Never true  Transportation Needs: No Transportation Needs (12/15/2023)   PRAPARE - Administrator, Civil Service (Medical): No    Lack of Transportation (Non-Medical): No  Physical Activity: Unknown (11/20/2023)   Received [facility]   Exercise Vital Sign    On average, how many days per week do you engage in moderate to strenuous exercise (like a brisk walk)?: 0 days    Minutes of Exercise per Session: Not on file  Stress: No Stress Concern Present (11/20/2023)   Received from [facility] - Occupational Stress Questionnaire    Feeling of Stress : Only a little  Social Connections: Socially Integrated (12/15/2023)   Social Connection and Isolation Panel    Frequency of Communication with Friends and Family: More than three times a week    Frequency of Social Gatherings with Friends and Family: Three times a week    Attends Religious Services: More than 4 times per year    Active Member of Clubs or Organizations: Yes    Attends Banker Meetings: More than 4 times per year    Marital Status: Married  Catering Manager Violence: Not At Risk (12/15/2023)   Humiliation, Afraid, Rape, and Kick questionnaire    Fear  of Current or Ex-Partner: No    Emotionally Abused: No    Physically Abused: No    Sexually Abused: No    Family History: Family History  Problem Relation Age of Onset   Lung cancer Mother    Hypertension Mother    Hypertension Father    Ovarian cancer Sister    Hypertension Brother    Angina Brother 21   Breast cancer Maternal Aunt    Brain cancer Maternal Aunt    Kidney cancer Paternal Uncle    Prostate cancer Paternal Uncle    Breast cancer Other        cousin   Ovarian cancer Other        cousin   Kidney cancer Other        cousin    Review of Systems: ROS Denies any recent chest pain, difficulty breathing beyond baseline, leg swelling, or fevers.  Physical Exam: Vital Signs BP 131/72 (BP Location: Right Arm, Patient Position: Sitting, Cuff Size: Normal)   Pulse 78   SpO2 97%   Physical Exam Constitutional:      General: Not in acute distress.    Appearance: Normal appearance. Not ill-appearing.  HENT:     Head: Normocephalic and atraumatic.  Eyes:     Pupils: Pupils are equal, round. Cardiovascular:     Rate and Rhythm:  Normal rate.    Pulses: Normal pulses.  Pulmonary:     Effort: No respiratory distress or increased work of breathing.  Speaks in full sentences. Abdominal:     General: Abdomen is flat. No distension.   Musculoskeletal: Normal range of motion. No lower extremity swelling or edema.  Spider veins, but no varicosities.  Skin:    General: Skin is warm and dry.     Findings: No erythema or rash.  Neurological:     Mental Status: Alert and oriented to person, place, and time.  Psychiatric:        Mood and Affect: Mood normal.        Behavior: Behavior normal.    Assessment/Plan: The patient is scheduled for removal of exposed left breast implant with Dr. Waddell.  Risks, benefits, and alternatives of procedure discussed, questions answered and consent obtained.    Smoking Status: Non-smoker.  Last Mammogram: MRI 04/2024; Results:  Negative.  Caprini Score: 8; Risk Factors include: Age, BMI greater than 25, and length of planned surgery. Recommendation for mechanical prophylaxis. Encourage early ambulation.   Pictures obtained: 05/09/2024  Post-op Rx sent to pharmacy: Oxycodone  and Zofran .  Patient was provided with the General Surgical Risk consent document and Pain Medication Agreement prior to their appointment.  They had adequate time to read through the risk consent documents and Pain Medication Agreement. We also discussed them in person together during this preop appointment. All of their questions were answered to their satisfaction.  Recommended calling if they have any further questions.  Risk consent form and Pain Medication Agreement to be scanned into patient's chart.    Electronically signed by: Honora Seip, PA-C 05/09/2024 2:37 PM

## 2024-05-09 NOTE — H&P (View-Only) (Signed)
 Patient ID: [name], female    DOB: 09/03/1940, 83 y.o.   MRN: [id number]  Chief Complaint  Patient presents with   Pre-op Exam      ICD-10-CM   1. Extrusion of breast implant, initial encounter  T85.49XA        History of Present Illness: [name] is a 83 y.o.  female  with a history of left breast cancer with unilateral mastectomy implant-based reconstruction.  She presents for preoperative evaluation for upcoming procedure, removal of exposed left breast implant with possible capsulectomy, scheduled for TBD with Dr. Waddell.  The patient has not had problems with anesthesia aside from PONV.  She denies any personal or family history of blood clots or clotting disorder.  She sees Dr. Ladona, cardiology, annually for her hypertension.  EF preserved on most recent echocardiogram.  Patient may continue with her antihypertensives and other medications perioperatively.  Denies any nicotine use.  She does have asthma as well as OSA.  Discussed risks of surgery as well as expectations.  She is understanding and agreeable to proceed.  Given acuity of situation, no clearances will be obtained per Dr. Waddell.  She plans to bring her CPAP to the OR with her.  Summary of Previous Visit: Patient was seen initially on 05/02/2024 for new onset left breast rash and blistering.  Suspected possible herpetic infection, but started her on antibiotics encouraged her to follow-up in 1 week.  She expressed interest in removal of the implant.  However, patient then arrives today and there is a clear implant exposure.  Discussed plan for OR in the next 48 hours with possible capsulectomy and drain placement.  Job: Retired.  PMH Significant for: Left breast cancer with left breast cancer with unilateral mastectomy and implant-based reconstruction including latissimus dorsi flap, contralateral reduction for symmetry, thyroid  disorder, OSA, HTN, asthma, anxiety, shoulder surgery 12/2023.    Past  Medical History: Allergies: Allergies  Allergen Reactions   Nebivolol  Hcl Other (See Comments)    Hypotensive episode  Bystolic *   Bactrim [Sulfamethoxazole-Trimethoprim] Other (See Comments)    UNSPECIFIED    Dairy Aid [Tilactase] Diarrhea and Other (See Comments)    Ice cream, sour cream   Lactose Intolerance (Gi) Diarrhea   Microplegia Msa-Msg [Cardioplegia Del Nido Formula] Other (See Comments)    Unknown reaction    Adhesive [Tape] Rash   Mobic [Meloxicam] Nausea Only   Other Nausea And Vomiting    general anesthesia   Sulfa Antibiotics Rash         Current Medications:  Current Outpatient Medications:    acetaminophen  (TYLENOL ) 650 MG CR tablet, Take 650-1,300 mg by mouth in the morning, at noon, in the evening, and at bedtime., Disp: , Rfl:    albuterol  (PROVENTIL ) (2.5 MG/3ML) 0.083% nebulizer solution, Take 3 mLs (2.5 mg dose) by nebulization every 6 (six) hours as needed for Wheezing., Disp: 360 mL, Rfl: 0   albuterol  (VENTOLIN  HFA) 108 (90 Base) MCG/ACT inhaler, Inhale 2 puffs into the lungs every 6 (six) hours as needed for wheezing or shortness of breath., Disp: 54 g, Rfl: 3   amoxicillin -clavulanate (AUGMENTIN ) 500-125 MG tablet, Take 1 tablet by mouth 3 (three) times daily for 7 days., Disp: 21 tablet, Rfl: 0   amoxicillin -clavulanate (AUGMENTIN ) 875-125 MG tablet, Take 1 tablet by mouth 2 (two) times daily., Disp: 20 tablet, Rfl: 0   B Complex-C (B-COMPLEX WITH VITAMIN C) tablet, Take 1 tablet by mouth in the morning., Disp: ,  Rfl:    Calcium  Carb-Cholecalciferol  (CALTRATE 600+D3 PO), Take 1 tablet by mouth in the morning, at noon, and at bedtime., Disp: , Rfl:    Cholecalciferol  (VITAMIN D3 PO), Take 1 tablet by mouth in the morning., Disp: , Rfl:    clobetasol ointment (TEMOVATE) 0.05 %, Apply 1 application  topically 2 (two) times daily as needed (irritation (lichen sclerosus))., Disp: , Rfl:    denosumab  (PROLIA ) 60 MG/ML SOSY injection, Inject 60 mg into the  skin every 6 (six) months., Disp: , Rfl:    diclofenac sodium (VOLTAREN) 1 % GEL, Apply 1 application  topically 4 (four) times daily as needed (pain.)., Disp: , Rfl:    diltiazem  (CARDIZEM  CD) 240 MG 24 hr capsule, Take 240 mg by mouth in the morning., Disp: , Rfl:    diphenhydrAMINE  HCl, Sleep, (ZZZQUIL PO), Take 30 mLs by mouth at bedtime., Disp: , Rfl:    famotidine  (PEPCID ) 40 MG tablet, Take 40 mg by mouth at bedtime., Disp: , Rfl:    fluticasone -salmeterol (ADVAIR ) 100-50 MCG/ACT AEPB, Inhale 1 puff into the lungs in the morning and at bedtime., Disp: 60 each, Rfl: 5   furosemide  (LASIX ) 40 MG tablet, Take 40 mg by mouth in the morning., Disp: , Rfl:    hydrALAZINE  (APRESOLINE ) 50 MG tablet, Take 1 tablet (50 mg total) by mouth 3 (three) times daily., Disp: 270 tablet, Rfl: 3   levocetirizine (XYZAL) 5 MG tablet, Take 5 mg by mouth in the morning and at bedtime., Disp: , Rfl:    levothyroxine  (SYNTHROID , LEVOTHROID) 88 MCG tablet, Take 88 mcg by mouth daily before breakfast., Disp: , Rfl:    losartan  (COZAAR ) 100 MG tablet, Take 100 mg by mouth in the morning., Disp: , Rfl:    Menthol , Topical Analgesic, (ASPERCREME MAX ROLL-ON) 16 % LIQD, Apply 1 Application topically as needed (pain.)., Disp: , Rfl:    Multiple Vitamins-Minerals (MULTIVITAMIN WITH MINERALS) tablet, Take 1 tablet by mouth daily with lunch. Centrum Silver for Women, Disp: , Rfl:    NON FORMULARY, Pt uses a cpap nightly, Disp: , Rfl:    ondansetron  (ZOFRAN -ODT) 4 MG disintegrating tablet, Take 1 tablet (4 mg total) by mouth every 8 (eight) hours as needed for nausea or vomiting., Disp: 20 tablet, Rfl: 0   ORTHOVISC 30 MG/2ML SOSY, Inject 6 mLs into the skin every 6 (six) months., Disp: , Rfl:    oxyCODONE  (ROXICODONE ) 5 MG immediate release tablet, Take 1 tablet (5 mg total) by mouth every 8 (eight) hours as needed for up to 5 days for severe pain (pain score 7-10)., Disp: 15 tablet, Rfl: 0   pantoprazole (PROTONIX) 40 MG  tablet, Take 40 mg by mouth 2 (two) times daily., Disp: , Rfl:    Polyethyl Glycol-Propyl Glycol (SYSTANE) 0.4-0.3 % SOLN, Place 1-2 drops into both eyes in the morning and at bedtime., Disp: , Rfl:    potassium chloride  SA (KLOR-CON  M20) 20 MEQ tablet, TAKE 1 TABLET BY MOUTH 3 TIMES A DAY, Disp: 270 tablet, Rfl: 3   sertraline  (ZOLOFT ) 100 MG tablet, Take 100 mg by mouth 2 (two) times daily. , Disp: , Rfl:    betamethasone  dipropionate (DIPROLENE ) 0.05 % ointment, Apply 1 application  topically 2 (two) times daily as needed (irritation)., Disp: , Rfl:    ondansetron  (ZOFRAN ) 4 MG tablet, Take 1 tablet (4 mg total) by mouth every 6 (six) hours as needed for nausea., Disp: 20 tablet, Rfl: 0   oxyCODONE  (ROXICODONE ) 5 MG  immediate release tablet, Take 1 tablet (5 mg total) by mouth every 4 (four) hours as needed., Disp: 30 tablet, Rfl: 0   tiZANidine  (ZANAFLEX ) 4 MG tablet, Take 0.5-1 tablets (2-4 mg total) by mouth every 8 (eight) hours as needed., Disp: 30 tablet, Rfl: 0  Current Facility-Administered Medications:    methylPREDNISolone  acetate (DEPO-MEDROL ) injection 80 mg, 80 mg, Intramuscular, Once, Hope Almarie ORN, NP  Past Medical Problems: Past Medical History:  Diagnosis Date   Anemia    PMH;  Patient denies this dx as of 06/03/21   Anxiety    Asthma    Breast cancer (HCC) 1988   left breast cancer   CHF (congestive heart failure) (HCC)    Hx of Acute  HF   Collagenous colitis    Compression fracture    lumbar 1   Dyspnea    with exertion   Heart murmur    Hypertension    Hypothyroidism    Osteoarthritis    Osteopenia    Pneumonia 07/2020   PONV (postoperative nausea and vomiting)    Sleep apnea    wears CPAP nightly    Past Surgical History: Past Surgical History:  Procedure Laterality Date   BREAST REDUCTION SURGERY     Right   CATARACT EXTRACTION W/ INTRAOCULAR LENS  IMPLANT, BILATERAL     CESAREAN SECTION     COLONOSCOPY W/ BIOPSIES AND POLYPECTOMY     CYST  EXCISION N/A 08/30/2018   Procedure: excision of posterior shoulder / back sebaceous cyst;  Surgeon: Lowery Estefana RAMAN, DO;  Location: [facility];  Service: Plastics;  Laterality: N/A;   KNEE ARTHROSCOPY Left 02/23/2022   Procedure: LEFT KNEE ARTHROSCOPY;  Surgeon: Sheril Coy, MD;  Location: [facility];  Service: Orthopedics;  Laterality: Left;   KYPHOPLASTY N/A 02/18/2016   Procedure: LUMBAR 1 KYPHOPLASTY;  Surgeon: Oneil Priestly, MD;  Location: [facility];  Service: Orthopedics;  Laterality: N/A;  LUMBAR 1 KYPHOPLASTY   KYPHOPLASTY N/A 06/04/2021   Procedure: LUMBAR 2 KYPHOPLASTY;  Surgeon: Priestly Oneil, MD;  Location: [facility];  Service: Orthopedics;  Laterality: N/A;   KYPHOPLASTY N/A 03/31/2023   Procedure: LUMBAR 4 KYPHOPLASTY;  Surgeon: Priestly Oneil, MD;  Location: [facility];  Service: Orthopedics;  Laterality: N/A;   LAPAROSCOPIC APPENDECTOMY N/A 12/09/2021   Procedure: APPENDECTOMY LAPAROSCOPIC;  Surgeon: Teresa Lonni HERO, MD;  Location: [facility];  Service: General;  Laterality: N/A;   LUMBAR DISC SURGERY     MODIFIED RADICAL MASTECTOMY W/ AXILLARY LYMPH NODE DISSECTION     Left   RECONSTRUCTION BREAST W/ LATISSIMUS DORSI FLAP     left   REDUCTION MAMMAPLASTY Right    REVERSE SHOULDER ARTHROPLASTY Right 12/15/2023   Procedure: ARTHROPLASTY, SHOULDER, TOTAL, REVERSE;  Surgeon: Dozier Soulier, MD;  Location: [facility];  Service: Orthopedics;  Laterality: Right;   right foot surgery     corrected hammer toe, straightened big toe   SEPTOPLASTY     TOE SURGERY     TONSILLECTOMY AND ADENOIDECTOMY      Social History: Social History   Socioeconomic History   Marital status: Married    Spouse name: Not on file   Number of children: 1   Years of education: Not on file   Highest education level: Not on file  Occupational History   Occupation: retired  Tobacco Use   Smoking status: Never   Smokeless tobacco: Never  Vaping Use   Vaping status: Never Used  Substance and  Sexual Activity  Alcohol  use: Yes    Alcohol /week: 3.0 - 4.0 standard drinks of alcohol     Types: 3 - 4 Glasses of wine per week    Comment: wine q day   Drug use: No   Sexual activity: Not Currently    Birth control/protection: Post-menopausal  Other Topics Concern   Not on file  Social History Narrative   Not on file   Social Drivers of Health   Financial Resource Strain: Low Risk  (11/20/2023)   Received [facility]   Overall Financial Resource Strain (CARDIA)    Difficulty of Paying Living Expenses: Not hard at all  Food Insecurity: No Food Insecurity (12/15/2023)   Hunger Vital Sign    Worried About Running Out of Food in the Last Year: Never true    Ran Out of Food in the Last Year: Never true  Transportation Needs: No Transportation Needs (12/15/2023)   PRAPARE - Administrator, Civil Service (Medical): No    Lack of Transportation (Non-Medical): No  Physical Activity: Unknown (11/20/2023)   Received from [facility]   Exercise Vital Sign    On average, how many days per week do you engage in moderate to strenuous exercise (like a brisk walk)?: 0 days    Minutes of Exercise per Session: Not on file  Stress: No Stress Concern Present (11/20/2023)   Received from [facility] - Occupational Stress Questionnaire    Feeling of Stress : Only a little  Social Connections: Socially Integrated (12/15/2023)   Social Connection and Isolation Panel    Frequency of Communication with Friends and Family: More than three times a week    Frequency of Social Gatherings with Friends and Family: Three times a week    Attends Religious Services: More than 4 times per year    Active Member of Clubs or Organizations: Yes    Attends Banker Meetings: More than 4 times per year    Marital Status: Married  Catering Manager Violence: Not At Risk (12/15/2023)   Humiliation, Afraid, Rape, and Kick questionnaire    Fear  of Current or Ex-Partner: No    Emotionally Abused: No    Physically Abused: No    Sexually Abused: No    Family History: Family History  Problem Relation Age of Onset   Lung cancer Mother    Hypertension Mother    Hypertension Father    Ovarian cancer Sister    Hypertension Brother    Angina Brother 21   Breast cancer Maternal Aunt    Brain cancer Maternal Aunt    Kidney cancer Paternal Uncle    Prostate cancer Paternal Uncle    Breast cancer Other        cousin   Ovarian cancer Other        cousin   Kidney cancer Other        cousin    Review of Systems: ROS Denies any recent chest pain, difficulty breathing beyond baseline, leg swelling, or fevers.  Physical Exam: Vital Signs BP 131/72 (BP Location: Right Arm, Patient Position: Sitting, Cuff Size: Normal)   Pulse 78   SpO2 97%   Physical Exam Constitutional:      General: Not in acute distress.    Appearance: Normal appearance. Not ill-appearing.  HENT:     Head: Normocephalic and atraumatic.  Eyes:     Pupils: Pupils are equal, round. Cardiovascular:     Rate and Rhythm:  Normal rate.    Pulses: Normal pulses.  Pulmonary:     Effort: No respiratory distress or increased work of breathing.  Speaks in full sentences. Abdominal:     General: Abdomen is flat. No distension.   Musculoskeletal: Normal range of motion. No lower extremity swelling or edema.  Spider veins, but no varicosities.  Skin:    General: Skin is warm and dry.     Findings: No erythema or rash.  Neurological:     Mental Status: Alert and oriented to person, place, and time.  Psychiatric:        Mood and Affect: Mood normal.        Behavior: Behavior normal.    Assessment/Plan: The patient is scheduled for removal of exposed left breast implant with Dr. Waddell.  Risks, benefits, and alternatives of procedure discussed, questions answered and consent obtained.    Smoking Status: Non-smoker.  Last Mammogram: MRI 04/2024; Results:  Negative.  Caprini Score: 8; Risk Factors include: Age, BMI greater than 25, and length of planned surgery. Recommendation for mechanical prophylaxis. Encourage early ambulation.   Pictures obtained: 05/09/2024  Post-op Rx sent to pharmacy: Oxycodone  and Zofran .  Patient was provided with the General Surgical Risk consent document and Pain Medication Agreement prior to their appointment.  They had adequate time to read through the risk consent documents and Pain Medication Agreement. We also discussed them in person together during this preop appointment. All of their questions were answered to their satisfaction.  Recommended calling if they have any further questions.  Risk consent form and Pain Medication Agreement to be scanned into patient's chart.    Electronically signed by: Honora Seip, PA-C 05/09/2024 2:37 PM

## 2024-05-10 ENCOUNTER — Other Ambulatory Visit: Payer: Self-pay

## 2024-05-10 ENCOUNTER — Encounter (HOSPITAL_COMMUNITY): Payer: Self-pay | Admitting: Plastic Surgery

## 2024-05-10 NOTE — Progress Notes (Signed)
 SDW call  Patient was given pre-op instructions over the phone. Patient verbalized understanding of instructions provided.     PCP - Camie Mirza, PA-C Cardiologist - Dr. Gordy Bergamo Pulmonary:    PPM/ICD - denies Device Orders - na Rep Notified - na   Chest x-ray - na EKG -  07/20/2023 Stress Test - ECHO - 08/12/2023 Cardiac Cath -  PFT: 07/27/2012  Sleep Study/sleep apnea/CPAP: OSA with nightly CPAP  Non-diabetic  Blood thinner Instructions: denies  Aspirin  Instructions:denies   ERAS Protcol - Clears until 1015   Anesthesia review: Yes. HTN, CHF, OSA with CPAP   Patient denies shortness of breath, fever, cough and chest pain over the phone call  Your procedure is scheduled on Friday May 11, 2024  Report to [facility] Main Entrance A at  1045  A.M., then check in with the Admitting office.  Call this number if you have problems the morning of surgery:  [phone]   If you have any questions prior to your surgery date call [phone]: Open Monday-Friday 8am-4pm If you experience any cold or flu symptoms such as cough, fever, chills, shortness of breath, etc. between now and your scheduled surgery, please notify us  at the above number    Remember:  Do not eat after midnight the night before your surgery  You may drink clear liquids until   1015  the morning of your surgery.   Clear liquids allowed are: Water , Non-Citrus Juices (without pulp), Carbonated Beverages, Clear Tea, Black Coffee ONLY (NO MILK, CREAM OR POWDERED CREAMER of any kind), and Gatorade   Take these medicines the morning of surgery with A SIP OF WATER :  Tylenol , augmentin , cardizem , advair , hydralazine , synthroid , protonix, systane eye gtt, zoloft   As needed: Albuterol  neb/inhaler, tramadol , zofran   As of today, STOP taking any Aspirin  (unless otherwise instructed by your surgeon) Aleve, Naproxen, Ibuprofen, Motrin, Advil, Goody's, BC's, all herbal medications, fish oil, and all vitamins.

## 2024-05-11 ENCOUNTER — Encounter (HOSPITAL_COMMUNITY)
Admission: RE | Disposition: A | Discharge status: Home / Self Care | Payer: Self-pay | Source: Home / Self Care | Attending: Plastic Surgery

## 2024-05-11 ENCOUNTER — Ambulatory Visit (HOSPITAL_COMMUNITY): Payer: Self-pay | Admitting: Physician Assistant

## 2024-05-11 ENCOUNTER — Encounter (HOSPITAL_COMMUNITY): Payer: Self-pay | Admitting: Plastic Surgery

## 2024-05-11 ENCOUNTER — Encounter (HOSPITAL_COMMUNITY): Payer: Self-pay | Admitting: Physician Assistant

## 2024-05-11 ENCOUNTER — Other Ambulatory Visit: Payer: Self-pay

## 2024-05-11 ENCOUNTER — Observation Stay (HOSPITAL_COMMUNITY)
Admission: RE | Admit: 2024-05-11 | Discharge: 2024-05-12 | Disposition: A | Discharge status: Home / Self Care | Attending: Plastic Surgery | Admitting: Plastic Surgery

## 2024-05-11 DIAGNOSIS — I509 Heart failure, unspecified: Secondary | ICD-10-CM

## 2024-05-11 DIAGNOSIS — T8549XA Other mechanical complication of breast prosthesis and implant, initial encounter: Secondary | ICD-10-CM | POA: Diagnosis not present

## 2024-05-11 DIAGNOSIS — Z85828 Personal history of other malignant neoplasm of skin: Secondary | ICD-10-CM | POA: Insufficient documentation

## 2024-05-11 DIAGNOSIS — J45909 Unspecified asthma, uncomplicated: Secondary | ICD-10-CM | POA: Diagnosis not present

## 2024-05-11 DIAGNOSIS — Y762 Prosthetic and other implants, materials and accessory obstetric and gynecological devices associated with adverse incidents: Secondary | ICD-10-CM | POA: Insufficient documentation

## 2024-05-11 DIAGNOSIS — Z96611 Presence of right artificial shoulder joint: Secondary | ICD-10-CM | POA: Insufficient documentation

## 2024-05-11 DIAGNOSIS — L905 Scar conditions and fibrosis of skin: Principal | ICD-10-CM | POA: Insufficient documentation

## 2024-05-11 DIAGNOSIS — E039 Hypothyroidism, unspecified: Secondary | ICD-10-CM | POA: Insufficient documentation

## 2024-05-11 DIAGNOSIS — I110 Hypertensive heart disease with heart failure: Secondary | ICD-10-CM | POA: Diagnosis not present

## 2024-05-11 DIAGNOSIS — Z9886 Personal history of breast implant removal: Principal | ICD-10-CM

## 2024-05-11 HISTORY — PX: BREAST IMPLANT REMOVAL: SHX5361

## 2024-05-11 LAB — COMPREHENSIVE METABOLIC PANEL WITH GFR
ALT: 15 U/L (ref 0–44)
AST: 30 U/L (ref 15–41)
Albumin: 3.7 g/dL (ref 3.5–5.0)
Alkaline Phosphatase: 63 U/L (ref 38–126)
Anion gap: 13 (ref 5–15)
BUN: 17 mg/dL (ref 8–23)
CO2: 23 mmol/L (ref 22–32)
Calcium: 9.2 mg/dL (ref 8.9–10.3)
Chloride: 101 mmol/L (ref 98–111)
Creatinine, Ser: 0.81 mg/dL (ref 0.44–1.00)
GFR, Estimated: >60 mL/min (ref >60)
Glucose, Bld: 93 mg/dL (ref 70–99)
Potassium: 4.3 mmol/L (ref 3.5–5.1)
Sodium: 137 mmol/L (ref 135–145)
Total Bilirubin: 1.1 mg/dL (ref 0.0–1.2)
Total Protein: 6.3 g/dL — ABNORMAL LOW (ref 6.5–8.1)

## 2024-05-11 LAB — CBC
HCT: 36.8 % (ref 36.0–46.0)
Hemoglobin: 11.8 g/dL — ABNORMAL LOW (ref 12.0–15.0)
MCH: 34.2 pg — ABNORMAL HIGH (ref 26.0–34.0)
MCHC: 32.1 g/dL (ref 30.0–36.0)
MCV: 106.7 fL — ABNORMAL HIGH (ref 80.0–100.0)
Platelets: 168 K/uL (ref 150–400)
RBC: 3.45 MIL/uL — ABNORMAL LOW (ref 3.87–5.11)
RDW: 13.2 % (ref 11.5–15.5)
WBC: 6.6 K/uL (ref 4.0–10.5)
nRBC: 0 % (ref 0.0–0.2)

## 2024-05-11 SURGERY — REMOVAL, IMPLANT, BREAST
Anesthesia: General | Site: Breast | Laterality: Left

## 2024-05-11 MED ORDER — FENTANYL CITRATE (PF) 250 MCG/5ML IJ SOLN
INTRAMUSCULAR | Status: DC | PRN
  Administered 2024-05-11 (×4): 50 ug via INTRAVENOUS

## 2024-05-11 MED ORDER — DILTIAZEM HCL ER COATED BEADS 240 MG PO CP24
240.0000 mg | ORAL_CAPSULE | Freq: Every day | ORAL | Status: DC
  Administered 2024-05-12: 240 mg via ORAL
  Filled 2024-05-11: qty 1
  Filled 2024-05-11: qty 2

## 2024-05-11 MED ORDER — CEFAZOLIN SODIUM-DEXTROSE 1-4 GM/50ML-% IV SOLN
1.0000 g | Freq: Once | INTRAVENOUS | Status: AC
  Administered 2024-05-11: 1 g via INTRAVENOUS
  Filled 2024-05-11: qty 50

## 2024-05-11 MED ORDER — ROCURONIUM BROMIDE 10 MG/ML (PF) SYRINGE
PREFILLED_SYRINGE | INTRAVENOUS | Status: DC | PRN
  Administered 2024-05-11: 60 mg via INTRAVENOUS

## 2024-05-11 MED ORDER — FENTANYL CITRATE (PF) 100 MCG/2ML IJ SOLN: 25.0000 ug | INTRAMUSCULAR | Status: DC | PRN

## 2024-05-11 MED ORDER — GLYCOPYRROLATE PF 0.2 MG/ML IJ SOSY
PREFILLED_SYRINGE | INTRAMUSCULAR | Status: AC
  Filled 2024-05-11: qty 1

## 2024-05-11 MED ORDER — 0.9 % SODIUM CHLORIDE (POUR BTL) OPTIME
TOPICAL | Status: DC | PRN
  Administered 2024-05-11: 1000 mL

## 2024-05-11 MED ORDER — OXYCODONE HCL 5 MG/5ML PO SOLN: 5.0000 mg | Freq: Once | ORAL | Status: DC | PRN

## 2024-05-11 MED ORDER — CHLORHEXIDINE GLUCONATE CLOTH 2 % EX PADS: 6.0000 | MEDICATED_PAD | Freq: Once | CUTANEOUS | Status: DC

## 2024-05-11 MED ORDER — LIDOCAINE 2% (20 MG/ML) 5 ML SYRINGE
INTRAMUSCULAR | Status: DC | PRN
  Administered 2024-05-11: 100 mg via INTRAVENOUS

## 2024-05-11 MED ORDER — OXYCODONE HCL 5 MG PO TABS: 5.0000 mg | ORAL_TABLET | Freq: Once | ORAL | Status: DC | PRN

## 2024-05-11 MED ORDER — ROCURONIUM BROMIDE 10 MG/ML (PF) SYRINGE
PREFILLED_SYRINGE | INTRAVENOUS | Status: AC
  Filled 2024-05-11: qty 10

## 2024-05-11 MED ORDER — ACETAMINOPHEN 325 MG PO TABS: 650.0000 mg | ORAL_TABLET | ORAL | Status: DC | PRN

## 2024-05-11 MED ORDER — DROPERIDOL 2.5 MG/ML IJ SOLN
0.6250 mg | Freq: Once | INTRAMUSCULAR | Status: AC | PRN
Start: 2024-05-11 — End: 2024-05-11
  Administered 2024-05-11: 0.625 mg via INTRAVENOUS

## 2024-05-11 MED ORDER — CHLORHEXIDINE GLUCONATE 0.12 % MT SOLN
15.0000 mL | Freq: Once | OROMUCOSAL | Status: AC
  Administered 2024-05-11: 15 mL via OROMUCOSAL
  Filled 2024-05-11: qty 15

## 2024-05-11 MED ORDER — PHENYLEPHRINE 80 MCG/ML (10ML) SYRINGE FOR IV PUSH (FOR BLOOD PRESSURE SUPPORT)
PREFILLED_SYRINGE | INTRAVENOUS | Status: AC
  Filled 2024-05-11: qty 10

## 2024-05-11 MED ORDER — PHENYLEPHRINE 80 MCG/ML (10ML) SYRINGE FOR IV PUSH (FOR BLOOD PRESSURE SUPPORT)
PREFILLED_SYRINGE | INTRAVENOUS | Status: DC | PRN
  Administered 2024-05-11: 40 ug via INTRAVENOUS

## 2024-05-11 MED ORDER — ONDANSETRON HCL 4 MG/2ML IJ SOLN
INTRAMUSCULAR | Status: AC
  Filled 2024-05-11: qty 2

## 2024-05-11 MED ORDER — TRANEXAMIC ACID 1000 MG/10ML IV SOLN
3000.0000 mg | Freq: Once | INTRAVENOUS | Status: AC
  Administered 2024-05-11: 3000 mg via TOPICAL
  Filled 2024-05-11: qty 30

## 2024-05-11 MED ORDER — LIDOCAINE-EPINEPHRINE 1 %-1:100000 IJ SOLN
INTRAMUSCULAR | Status: AC
  Filled 2024-05-11: qty 1

## 2024-05-11 MED ORDER — LACTATED RINGERS IV SOLN: INTRAVENOUS | Status: DC

## 2024-05-11 MED ORDER — DROPERIDOL 2.5 MG/ML IJ SOLN
INTRAMUSCULAR | Status: AC
Start: 2024-05-11 — End: 2024-05-11
  Filled 2024-05-11: qty 2

## 2024-05-11 MED ORDER — LIDOCAINE 2% (20 MG/ML) 5 ML SYRINGE
INTRAMUSCULAR | Status: AC
  Filled 2024-05-11: qty 5

## 2024-05-11 MED ORDER — ORAL CARE MOUTH RINSE
15.0000 mL | Freq: Once | OROMUCOSAL | Status: AC
Start: 2024-05-11 — End: 2024-05-11

## 2024-05-11 MED ORDER — DEXAMETHASONE SOD PHOSPHATE PF 10 MG/ML IJ SOLN
INTRAMUSCULAR | Status: DC | PRN
  Administered 2024-05-11: 10 mg via INTRAVENOUS

## 2024-05-11 MED ORDER — BUPIVACAINE-EPINEPHRINE (PF) 0.25% -1:200000 IJ SOLN
INTRAMUSCULAR | Status: DC | PRN
  Administered 2024-05-11: 10 mL

## 2024-05-11 MED ORDER — ONDANSETRON HCL 4 MG/2ML IJ SOLN: 4.0000 mg | Freq: Once | INTRAMUSCULAR | Status: DC | PRN

## 2024-05-11 MED ORDER — BUPIVACAINE-EPINEPHRINE (PF) 0.25% -1:200000 IJ SOLN
INTRAMUSCULAR | Status: AC
  Filled 2024-05-11: qty 30

## 2024-05-11 MED ORDER — FENTANYL CITRATE (PF) 100 MCG/2ML IJ SOLN
INTRAMUSCULAR | Status: AC
  Filled 2024-05-11: qty 2

## 2024-05-11 MED ORDER — SUGAMMADEX SODIUM 200 MG/2ML IV SOLN
INTRAVENOUS | Status: DC | PRN
  Administered 2024-05-11: 200 mg via INTRAVENOUS

## 2024-05-11 MED ORDER — SODIUM CHLORIDE 0.9 % IV SOLN: INTRAVENOUS | Status: DC

## 2024-05-11 MED ORDER — ONDANSETRON HCL 4 MG/2ML IJ SOLN
INTRAMUSCULAR | Status: DC | PRN
  Administered 2024-05-11 (×2): 4 mg via INTRAVENOUS

## 2024-05-11 MED ORDER — CEFAZOLIN SODIUM-DEXTROSE 2-4 GM/100ML-% IV SOLN
2.0000 g | INTRAVENOUS | Status: AC
  Administered 2024-05-11: 2 g via INTRAVENOUS
  Filled 2024-05-11: qty 100

## 2024-05-11 MED ORDER — ACETAMINOPHEN 10 MG/ML IV SOLN: 1000.0000 mg | Freq: Once | INTRAVENOUS | Status: DC | PRN

## 2024-05-11 MED ORDER — PROPOFOL 10 MG/ML IV BOLUS
INTRAVENOUS | Status: DC | PRN
  Administered 2024-05-11: 50 mg via INTRAVENOUS
  Administered 2024-05-11: 40 mg via INTRAVENOUS
  Administered 2024-05-11: 100 mg via INTRAVENOUS

## 2024-05-11 MED ORDER — CEFAZOLIN SODIUM 1-5 GM-% IV SOLN: 1.0000 g | Freq: Once | INTRAVENOUS | Status: DC

## 2024-05-11 MED ORDER — PROPOFOL 10 MG/ML IV BOLUS
INTRAVENOUS | Status: AC
  Filled 2024-05-11: qty 20

## 2024-05-11 MED ORDER — OXYCODONE-ACETAMINOPHEN 5-325 MG PO TABS
1.0000 | ORAL_TABLET | Freq: Three times a day (TID) | ORAL | Status: DC | PRN
  Administered 2024-05-11: 2 via ORAL
  Filled 2024-05-11: qty 2

## 2024-05-11 MED ORDER — ONDANSETRON 4 MG PO TBDP: 4.0000 mg | ORAL_TABLET | Freq: Once | ORAL | Status: DC

## 2024-05-11 SURGICAL SUPPLY — 41 items
BAG COUNTER SPONGE SURGICOUNT (BAG) ×1 IMPLANT
BINDER BREAST XXLRG (GAUZE/BANDAGES/DRESSINGS) IMPLANT
BIOPATCH RED 1 DISK 7.0 (GAUZE/BANDAGES/DRESSINGS) IMPLANT
BNDG ELASTIC 6INX 5YD STR LF (GAUZE/BANDAGES/DRESSINGS) IMPLANT
CANISTER SUCTION 3000ML PPV (SUCTIONS) ×1 IMPLANT
CNTNR URN SCR LID CUP LEK RST (MISCELLANEOUS) IMPLANT
COVER SURGICAL LIGHT HANDLE (MISCELLANEOUS) ×1 IMPLANT
DERMABOND ADVANCED .7 DNX12 (GAUZE/BANDAGES/DRESSINGS) IMPLANT
DRAIN CHANNEL 19F RND (DRAIN) IMPLANT
DRAPE SURG ORHT 6 SPLT 77X108 (DRAPES) ×2 IMPLANT
ELECTRODE REM PT RTRN 9FT ADLT (ELECTROSURGICAL) ×1 IMPLANT
EVACUATOR SILICONE 100CC (DRAIN) IMPLANT
GAUZE PAD ABD 8X10 STRL (GAUZE/BANDAGES/DRESSINGS) IMPLANT
GAUZE SPONGE 4X4 12PLY STRL (GAUZE/BANDAGES/DRESSINGS) IMPLANT
GLOVE BIO SURGEON STRL SZ 6.5 (GLOVE) ×3 IMPLANT
GLOVE BIOGEL M STRL SZ7.5 (GLOVE) IMPLANT
GLOVE INDICATOR 8.0 STRL GRN (GLOVE) IMPLANT
GOWN STRL REUS W/ TWL LRG LVL3 (GOWN DISPOSABLE) ×3 IMPLANT
KIT BASIN OR (CUSTOM PROCEDURE TRAY) ×1 IMPLANT
KIT TURNOVER KIT B (KITS) ×1 IMPLANT
MARKER SKIN DUAL TIP RULER LAB (MISCELLANEOUS) ×1 IMPLANT
NDL HYPO 25GX1X1/2 BEV (NEEDLE) ×1 IMPLANT
NEEDLE HYPO 25GX1X1/2 BEV (NEEDLE) ×1 IMPLANT
PACK GENERAL/GYN (CUSTOM PROCEDURE TRAY) ×1 IMPLANT
PAD ARMBOARD POSITIONER FOAM (MISCELLANEOUS) ×2 IMPLANT
PENCIL SMOKE EVACUATOR (MISCELLANEOUS) ×1 IMPLANT
SOL PREP POV-IOD 4OZ 10% (MISCELLANEOUS) ×1 IMPLANT
SOLN 0.9% NACL POUR BTL 1000ML (IV SOLUTION) ×1 IMPLANT
SOLN STERILE WATER BTL 1000 ML (IV SOLUTION) IMPLANT
STAPLER SKIN PROX 35W (STAPLE) IMPLANT
SUT MNCRL AB 3-0 PS2 18 (SUTURE) IMPLANT
SUT MNCRL AB 4-0 PS2 18 (SUTURE) ×4 IMPLANT
SUT MON AB 3-0 SH27 (SUTURE) ×1 IMPLANT
SUT MON AB 5-0 PS2 18 (SUTURE) ×1 IMPLANT
SUT PDS AB 3-0 SH 27 (SUTURE) ×1 IMPLANT
SUT PROLENE 3 0 PS 2 (SUTURE) ×2 IMPLANT
SUT SILK 2 0 SH (SUTURE) IMPLANT
SUT VIC AB 3-0 SH 18 (SUTURE) ×2 IMPLANT
SYR CONTROL 10ML LL (SYRINGE) ×1 IMPLANT
TOWEL GREEN STERILE (TOWEL DISPOSABLE) ×1 IMPLANT
TOWEL GREEN STERILE FF (TOWEL DISPOSABLE) ×1 IMPLANT

## 2024-05-11 NOTE — Interval H&P Note (Signed)
 History and Physical Interval Note: No change in exam or indication for surgery. Implant remains exposed but without evidence of infection. All questions answered. Site marked with her concurrence. Will proceed with implant removal and limited capsulectomy at her request.  05/11/2024 12:42 PM  [name]  has presented today for surgery, with the diagnosis of t85.49xa.  The various methods of treatment have been discussed with the patient and family. After consideration of risks, benefits and other options for treatment, the patient has consented to  Procedure(s) with comments: REMOVAL, IMPLANT, BREAST (Left) - Removal of exposed left breast implant as a surgical intervention.  The patient's history has been reviewed, patient examined, no change in status, stable for surgery.  I have reviewed the patient's chart and labs.  Questions were answered to the patient's satisfaction.     Leonce KATHEE Birmingham

## 2024-05-11 NOTE — Anesthesia Preprocedure Evaluation (Addendum)
 Anesthesia Evaluation  Patient identified by MRN, date of birth, ID band Patient awake    Reviewed: Allergy & Precautions, NPO status , Patient's Chart, lab work & pertinent test results  History of Anesthesia Complications (+) PONV and history of anesthetic complications  Airway Mallampati: II       Dental  (+) Teeth Intact, Dental Advisory Given   Pulmonary asthma , sleep apnea and Continuous Positive Airway Pressure Ventilation    breath sounds clear to auscultation       Cardiovascular hypertension, +CHF and + DOE   Rhythm:Regular Rate:Normal  TTE 2025: 1. Left ventricular ejection fraction, by estimation, is 65 to 70%. The  left ventricle has normal function. The left ventricle has no regional  wall motion abnormalities. There is mild asymmetric left ventricular  hypertrophy of the basal-septal segment.  Left ventricular diastolic parameters are consistent with Grade I  diastolic dysfunction (impaired relaxation).   2. Right ventricular systolic function is normal. The right ventricular  size is normal.   3. Left atrial size was mild to moderately dilated.   4. The mitral valve is normal in structure. Trivial mitral valve  regurgitation. No evidence of mitral stenosis.   5. The aortic valve is tricuspid. There is mild calcification of the  aortic valve. Aortic valve regurgitation is not visualized. Aortic valve  sclerosis/calcification is present, without any evidence of aortic  stenosis.   6. The inferior vena cava is normal in size with greater than 50%  respiratory variability, suggesting right atrial pressure of 3 mmHg.      Neuro/Psych   Anxiety        GI/Hepatic   Endo/Other  Hypothyroidism    Renal/GU      Musculoskeletal   Abdominal   Peds  Hematology  (+) Blood dyscrasia, anemia   Anesthesia Other Findings   Reproductive/Obstetrics                               Anesthesia Physical Anesthesia Plan  ASA: 3  Anesthesia Plan: General   Post-op Pain Management:    Induction: Intravenous  PONV Risk Score and Plan: 4 or greater and Ondansetron , Dexamethasone  and Treatment may vary due to age or medical condition  Airway Management Planned: LMA and Oral ETT  Additional Equipment:   Intra-op Plan:   Post-operative Plan: Extubation in OR  Informed Consent:      Dental advisory given  Plan Discussed with: CRNA  Anesthesia Plan Comments:          Anesthesia Quick Evaluation

## 2024-05-11 NOTE — Op Note (Signed)
 DATE OF OPERATION: 05/11/2024  LOCATION: [facility] Main operating Room  PREOPERATIVE DIAGNOSIS: Exposed left breast implant  POSTOPERATIVE DIAGNOSIS: Same  PROCEDURE: Removal of left breast implant partial capsulectomy and revision of reconstruction  SURGEON: Marinell Birmingham, MD  ASSISTANT: Not applicable  EBL: 5 cc  CONDITION: Stable  COMPLICATIONS: None  INDICATION: The patient, [name], is a 83 y.o. female born on [birth year], is here for treatment of an exposed breast implant in the left breast placed in the late 80s.   PROCEDURE DETAILS:  The patient was seen prior to surgery and marked.  The IV antibiotics were given. The patient was taken to the operating room and given a general anesthetic. A standard time out was performed and all information was confirmed by those in the room. SCDs were placed.   The chest was prepped and draped in usual sterile manner.  The compromised skin was excised and sent to pathology for routine examination.  The implant which was intact was removed without difficulty.  The capsule came to the skin and was inseparable from the skin.  The portion of along the medial chest wall and the lateral latissimus flap was removed with the electrocautery taking care to cauterize all vessels as encountered.  Approximately 75% of the capsule was removed.  This was sent to pathology for routine examination.  On inspection of the cavity I realized that I could medialize the flap and remaining breast tissue to close most of the cavity resulting from the breast implant.  I did this and used a series of interrupted 3-0 PDS sutures to suture the medial portion of the flap to the chest wall.  The cavity was then packed with a TXA soaked gauze and left in place for 5 minutes.  I then excised the excess skin to allow for an aesthetically pleasing closure.  A 19 French Blake drain was placed in the remaining pocket and brought out through a separate stab incision the deep tissues were  closed with interrupted and running 3-0 Monocryl sutures.  The dermis was closed with interrupted and running 3-0 Monocryl sutures.  The skin was closed with a running subcuticular 4-0 Monocryl suture.  The incision was sealed with Dermabond.  An ABD pad was placed and the patient was placed in a supportive compressive garment.  She was awakened from anesthesia without incident and transferred to the recovery room in good condition.  All instrument needle and sponge counts were reported as correct and there were no other complications noted. The patient was allowed to wake up and taken to recovery room in stable condition at the end of the case. The family was notified at the end of the case.

## 2024-05-11 NOTE — Anesthesia Procedure Notes (Signed)
 Procedure Name: Intubation Date/Time: 05/11/2024 1:20 PM  Performed by: Waddell Lauraine NOVAK, MDPre-anesthesia Checklist: Patient identified, Emergency Drugs available, Suction available and Patient being monitored Patient Re-evaluated:Patient Re-evaluated prior to induction Oxygen  Delivery Method: Circle System Utilized Preoxygenation: Pre-oxygenation with 100% oxygen  Induction Type: IV induction Ventilation: Mask ventilation without difficulty Laryngoscope Size: Mac and 3 Grade View: Grade II Tube type: Oral Tube size: 6.5 mm Number of attempts: 2 Airway Equipment and Method: Stylet and Oral airway Placement Confirmation: ETT inserted through vocal cords under direct vision, positive ETCO2 and breath sounds checked- equal and bilateral Secured at: 20 cm Tube secured with: Tape Dental Injury: Teeth and Oropharynx as per pre-operative assessment  Comments: First attempt with grade 2a view Mac 3 blade, unable to pass ETT through vocal cords due to redundant structure and difficulty removing the stylet.  Masked in between attempts. Easy mask with 9.0 oral airway.  Second attempt by MDA with MAC 3 blade and cricoid pressure. Head and neck midline. Equal bilateral lung sounds.

## 2024-05-11 NOTE — Plan of Care (Signed)
 Patient arrived in the unit via wheelchair, no complaints of pain. Husband informed thru call. Needs attended. Bed alarm on. Call bell within reached. Problem: Education: Goal: Knowledge of General Education information will improve Description: Including pain rating scale, medication(s)/side effects and non-pharmacologic comfort measures Outcome: Progressing   Problem: Health Behavior/Discharge Planning: Goal: Ability to manage health-related needs will improve Outcome: Progressing   Problem: Clinical Measurements: Goal: Ability to maintain clinical measurements within normal limits will improve Outcome: Progressing Goal: Will remain free from infection Outcome: Progressing Goal: Diagnostic test results will improve Outcome: Progressing Goal: Respiratory complications will improve Outcome: Progressing Goal: Cardiovascular complication will be avoided Outcome: Progressing   Problem: Activity: Goal: Risk for activity intolerance will decrease Outcome: Progressing   Problem: Nutrition: Goal: Adequate nutrition will be maintained Outcome: Progressing   Problem: Coping: Goal: Level of anxiety will decrease Outcome: Progressing   Problem: Elimination: Goal: Will not experience complications related to bowel motility Outcome: Progressing Goal: Will not experience complications related to urinary retention Outcome: Progressing   Problem: Pain Managment: Goal: General experience of comfort will improve and/or be controlled Outcome: Progressing   Problem: Safety: Goal: Ability to remain free from injury will improve Outcome: Progressing   Problem: Skin Integrity: Goal: Risk for impaired skin integrity will decrease Outcome: Progressing

## 2024-05-11 NOTE — Transfer of Care (Signed)
 Immediate Anesthesia Transfer of Care Note  Patient: [name]  Procedure(s) Performed: REMOVAL, IMPLANT, BREAST (Left: Breast)  Patient Location: PACU  Anesthesia Type:General  Level of Consciousness: awake  Airway & Oxygen  Therapy: Patient connected to face mask oxygen   Post-op Assessment: Report given to RN  Post vital signs: stable  Last Vitals:  Vitals Value Taken Time  BP 168/68 05/11/24 15:15  Temp    Pulse 74 05/11/24 15:21  Resp 20 05/11/24 15:21  SpO2 100 % 05/11/24 15:21  Vitals shown include unfiled device data.  Last Pain:  Vitals:   05/11/24 1122  TempSrc:   PainSc: 0-No pain         Complications: No notable events documented.

## 2024-05-11 NOTE — Anesthesia Postprocedure Evaluation (Signed)
 Anesthesia Post Note  Patient: [name]  Procedure(s) Performed: REMOVAL, IMPLANT, BREAST (Left: Breast)     Patient location during evaluation: PACU Anesthesia Type: General Level of consciousness: awake Pain management: pain level controlled Vital Signs Assessment: post-procedure vital signs reviewed and stable Respiratory status: spontaneous breathing Cardiovascular status: blood pressure returned to baseline Postop Assessment: no apparent nausea or vomiting Anesthetic complications: no   No notable events documented.              Lauraine KATHEE Birmingham

## 2024-05-12 ENCOUNTER — Encounter (HOSPITAL_COMMUNITY): Payer: Self-pay | Admitting: Plastic Surgery

## 2024-05-12 DIAGNOSIS — L905 Scar conditions and fibrosis of skin: Secondary | ICD-10-CM | POA: Diagnosis not present

## 2024-05-12 NOTE — Progress Notes (Signed)
 Discharge   Patient expressed verbal understanding of discharge POC.   Patient given time to ask any questions.  Additional education included in AVS. Extensive teaching given with teachback instruction.  Alert oriented in good spirits.   PIV removed.  Pressure dressings intact.  All wounds and drains intact.  Breast packing given.  Ride on the way.  Discharge lounge notified.

## 2024-05-12 NOTE — Discharge Instructions (Addendum)
 Activity as tolerated, no heavy lifting greater than 20 pounds, no submerging the incisions in water . May shower this afternoon. Please wear binder at all times unless showering or washing garment. Gauze over incision for comfort Diet as tolerated Please record drain output and bring record to appointment Please call the office on Monday morning for appointment time Please call the office for any questions or concerns [phone]

## 2024-05-12 NOTE — Care Management Obs Status (Signed)
 MEDICARE OBSERVATION STATUS NOTIFICATION   Patient Details  Name: NINNIE FEIN MRN: [id number] Date of Birth: 28-Dec-1940   Medicare Observation Status Notification Given:  Yes    Robynn Eileen Hoose, RN 05/12/2024, 9:55 AM

## 2024-05-12 NOTE — Discharge Summary (Signed)
 Physician Discharge Summary  Patient ID: [name] MRN: [id number] DOB/AGE: 10/29/1940 83 y.o.  Admit date: 05/11/2024 Discharge date: 05/12/2024  Admission Diagnoses:  Discharge Diagnoses:  Principal Problem:   S/p breast implant removal   Discharged Condition: good  Hospital Course: [name] was seen in the office and noted to have an impending extrusion of her left breast implant.  Arrangements were made for urgent admission and removal of the implant.  She underwent uncomplicated removal of her left implant on November 7.  Overnight there were no issues.  Her drain output has been minimal.  Her pain is well-controlled.  This morning she is sitting on the side of the bed with no complaints.  She is tolerating a diet.  She is voiding.  Consults: None  Significant Diagnostic Studies: Capsules were sent to pathology for routine examination  Treatments: antibiotics: Ancef  1 dose postoperatively  Discharge Exam: Blood pressure (!) 144/72, pulse 81, temperature 98 F (36.7 C), temperature source Oral, resp. rate 19, height 4' 11.5 (1.511 m), weight 102.1 kg, SpO2 92%. Drain output is thin and serous.  The incision is clean dry and intact.  The dressings are dry with no drainage.  Disposition: Discharge home to self-care.  She will call the office on Monday morning to confirm her appointment time and will follow-up with me next week.  Discharge Instructions     Call MD for:  difficulty breathing, headache or visual disturbances   Complete by: As directed    Call MD for:  hives   Complete by: As directed    Call MD for:  persistant dizziness or light-headedness   Complete by: As directed    Call MD for:  persistant nausea and vomiting   Complete by: As directed    Call MD for:  redness, tenderness, or signs of infection (pain, swelling, redness, odor or green/yellow discharge around incision site)   Complete by: As directed    Call MD for:  severe uncontrolled pain    Complete by: As directed    Call MD for:  temperature >100.4   Complete by: As directed    Diet - low sodium heart healthy   Complete by: As directed    Increase activity slowly   Complete by: As directed    Nursing communication   Complete by: As directed    Please teach drain care      Allergies as of 05/12/2024       Reactions   Nebivolol  Hcl Other (See Comments)   Hypotensive episode Bystolic *   Bactrim [sulfamethoxazole-trimethoprim] Other (See Comments)   UNSPECIFIED   Dairy Aid [tilactase] Diarrhea, Other (See Comments)   Ice cream, sour cream   Lactose Intolerance (gi) Diarrhea   Microplegia Msa-msg [cardioplegia [place] Formula] Other (See Comments)   Unknown reaction    Adhesive [tape] Rash   Mobic [meloxicam] Nausea Only   Other Nausea And Vomiting   general anesthesia   Sulfa Antibiotics Rash           Medication List     TAKE these medications    acetaminophen  650 MG CR tablet Commonly known as: TYLENOL  Take 650-1,300 mg by mouth 3 (three) times daily.   albuterol  108 (90 Base) MCG/ACT inhaler Commonly known as: VENTOLIN  HFA Inhale 2 puffs into the lungs every 6 (six) hours as needed for wheezing or shortness of breath. What changed: Another medication with the same name was removed. Continue taking this medication, and follow the directions you  see here.   Aspercreme Max Roll-On 16 % Liqd Generic drug: Menthol  (Topical Analgesic) Apply 1 Application topically as needed (pain.).   B-complex with vitamin C tablet Take 1 tablet by mouth in the morning.   calcitonin (salmon) 200 UNIT/ACT nasal spray Commonly known as: MIACALCIN/FORTICAL Place 1 spray into alternate nostrils daily.   CALTRATE 600+D3 PO Take 1 tablet by mouth 2 (two) times daily.   clobetasol ointment 0.05 % Commonly known as: TEMOVATE Apply 1 application  topically daily as needed (irritation (lichen sclerosus)).   diclofenac sodium 1 % Gel Commonly known as:  VOLTAREN Apply 1 application  topically 4 (four) times daily as needed (pain.).   diltiazem  240 MG 24 hr capsule Commonly known as: CARDIZEM  CD Take 240 mg by mouth in the morning.   diphenhydrAMINE  50 MG capsule Commonly known as: BENADRYL  Take 100 mg by mouth 2 (two) times daily.   famotidine  40 MG tablet Commonly known as: PEPCID  Take 40 mg by mouth at bedtime.   fluticasone -salmeterol 100-50 MCG/ACT Aepb Commonly known as: ADVAIR  Inhale 1 puff into the lungs in the morning and at bedtime.   furosemide  40 MG tablet Commonly known as: LASIX  Take 40 mg by mouth in the morning.   hydrALAZINE  50 MG tablet Commonly known as: APRESOLINE  Take 1 tablet (50 mg total) by mouth 3 (three) times daily.   Klor-Con  M20 20 MEQ tablet Generic drug: potassium chloride  SA TAKE 1 TABLET BY MOUTH 3 TIMES A DAY   levocetirizine 5 MG tablet Commonly known as: XYZAL Take 5 mg by mouth at bedtime.   levothyroxine  88 MCG tablet Commonly known as: SYNTHROID  Take 88 mcg by mouth daily before breakfast.   losartan  100 MG tablet Commonly known as: COZAAR  Take 100 mg by mouth in the morning.   multivitamin with minerals tablet Take 1 tablet by mouth daily with lunch. Centrum Silver for Women   NON FORMULARY Pt uses a cpap nightly   ondansetron  4 MG tablet Commonly known as: ZOFRAN  Take 4 mg by mouth every 8 (eight) hours as needed for nausea or vomiting.   OrthoVisc 30 MG/2ML Sosy intra-articular injection Generic drug: Hyaluronan Inject 6 mLs into the skin every 6 (six) months.   oxyCODONE  5 MG immediate release tablet Commonly known as: Roxicodone  Take 1 tablet (5 mg total) by mouth every 8 (eight) hours as needed for up to 5 days for severe pain (pain score 7-10).   pantoprazole 40 MG tablet Commonly known as: PROTONIX Take 40 mg by mouth daily.   Prolia  60 MG/ML Sosy injection Generic drug: denosumab  Inject 60 mg into the skin every 6 (six) months.   sertraline  100 MG  tablet Commonly known as: ZOLOFT  Take 100 mg by mouth 2 (two) times daily.   Systane 0.4-0.3 % Soln Generic drug: Polyethyl Glycol-Propyl Glycol Place 1-2 drops into both eyes in the morning and at bedtime.   traMADol  50 MG tablet Commonly known as: ULTRAM  Take 50 mg by mouth 3 (three) times daily as needed for moderate pain (pain score 4-6) or severe pain (pain score 7-10).   VITAMIN D3 PO Take 1 tablet by mouth in the morning.   ZZZQUIL PO Take 30 mLs by mouth at bedtime.         [facility] [address] [place], [place] [address] [phone]  Signed: Leonce KATHEE [place] 05/12/2024, 8:14 AM

## 2024-05-12 NOTE — Plan of Care (Signed)

## 2024-05-15 LAB — SURGICAL PATHOLOGY

## 2024-05-18 ENCOUNTER — Ambulatory Visit (INDEPENDENT_AMBULATORY_CARE_PROVIDER_SITE_OTHER): Admitting: Physician Assistant

## 2024-05-18 VITALS — BP 162/79 | HR 68

## 2024-05-18 DIAGNOSIS — Z9886 Personal history of breast implant removal: Secondary | ICD-10-CM

## 2024-05-18 NOTE — Progress Notes (Signed)
 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 Waddell who presents to clinic for postoperative follow-up.  Today, patient is doing well from a postoperative standpoint.  She denies any significant discomfort.  Drain output has been minimal, consistently less than 10 cc/day since surgery.  She is tolerating p.o. intake without difficulty, voiding.  Ambulatory, albeit difficult with chronic knee pain.  Denies any chest pain, difficulty breathing, fevers, or other concerns.  On exam, the breast is soft and without any palpable underlying fluid collections.  Incision CDI.  Drain is intact and functional with less than 10 cc dark sanguinous output in bulb.  Legs without any asymmetric swelling.  Drain removed without complication or difficulty, well-tolerated by patient.  Advised patient to replace gauze as needed for drainage.  Continue with activity modifications and compressive garments.  Follow-up next week as scheduled, sooner if needed.  Encouraged her to call the office should she have any questions or concerns.

## 2024-05-24 ENCOUNTER — Ambulatory Visit: Admitting: Plastic Surgery

## 2024-05-24 VITALS — BP 126/73 | HR 70

## 2024-05-24 DIAGNOSIS — Z9889 Other specified postprocedural states: Secondary | ICD-10-CM

## 2024-05-24 NOTE — Progress Notes (Signed)
 [name] returns today approximately 2 weeks postop from removal of her left breast implant.  She is doing well with no complaints and overall happy with the results.  On evaluation the incisions are clean dry and intact.  She did have an area that appeared to be an area of excoriation.  On further reflection this is most likely the prior drain site.  She was encouraged to keep this area covered with a Band-Aid.  She will follow-up with me in approximately a month.  She will begin scar massage.

## 2024-05-30 ENCOUNTER — Encounter: Admitting: Physician Assistant

## 2024-06-19 ENCOUNTER — Encounter: Admitting: Physician Assistant

## 2024-06-21 ENCOUNTER — Ambulatory Visit: Admitting: Plastic Surgery

## 2024-06-21 VITALS — BP 167/83 | HR 71 | Temp 98.1°F

## 2024-06-21 DIAGNOSIS — Z9886 Personal history of breast implant removal: Secondary | ICD-10-CM

## 2024-06-21 NOTE — Progress Notes (Signed)
 [name] returns today for follow-up after removal of her left breast implant and revision of her left breast reconstruction.  She is doing quite well and is overall very pleased with the result of her surgery.  She does note that now that the breast is more medialized that there is some ongoing rashes where the breast skin lies against the abdominal wall skin.  On examination all incisions are clean dry and intact and well-healed.  She does have some excoriation in the inframammary fold.  Status post removal of left breast implant: Doing well and overall nice aesthetic result.  I have told her that she should keep the area dry this includes possibly using baby powder or cornstarch as well as keeping a gauze between the breast and the abdominal wall.  Return to see me for any questions or concerns.

## 2024-07-03 ENCOUNTER — Other Ambulatory Visit: Payer: Self-pay | Admitting: Primary Care

## 2024-07-03 MED ORDER — FLUTICASONE-SALMETEROL 100-50 MCG/ACT IN AEPB: 1.0000 | INHALATION_SPRAY | Freq: Two times a day (BID) | RESPIRATORY_TRACT | 3 refills | Status: AC

## 2024-07-03 NOTE — Telephone Encounter (Signed)
 Copied from CRM #[id number]. Topic: Clinical - Medication Refill >> Jul 03, 2024 12:21 PM Russell PARAS wrote: Medication:   fluticasone -salmeterol (ADVAIR ) 100-50 MCG/ACT AEPB  Has the patient contacted their pharmacy? Yes (Agent: If no, request that the patient contact the pharmacy for the refill. If patient does not wish to contact the pharmacy document the reason why and proceed with request.) (Agent: If yes, when and what did the pharmacy advise?)  This is the patient's preferred pharmacy:  [facility] [id number] - [place], [place] - [address] [place] [address] Phone: [phone] Fax: [phone]  Is this the correct pharmacy for this prescription? Yes If no, delete pharmacy and type the correct one.   Has the prescription been filled recently? Yes  Is the patient out of the medication? Yes, has been out since the weekend.   Has the patient been seen for an appointment in the last year OR does the patient have an upcoming appointment? Yes, 11/18/2023 w/ [facility]  Can we respond through MyChart? Yes  Agent: Please be advised that Rx refills may take up to 3 business days. We ask that you follow-up with your pharmacy.

## 2024-07-17 ENCOUNTER — Encounter: Payer: Self-pay | Admitting: Primary Care

## 2024-07-17 ENCOUNTER — Ambulatory Visit: Admitting: Primary Care

## 2024-07-17 VITALS — BP 122/62 | HR 76 | Temp 97.6°F | Ht 59.5 in | Wt 225.0 lb

## 2024-07-17 DIAGNOSIS — J4530 Mild persistent asthma, uncomplicated: Secondary | ICD-10-CM

## 2024-07-17 DIAGNOSIS — G4733 Obstructive sleep apnea (adult) (pediatric): Secondary | ICD-10-CM | POA: Diagnosis not present

## 2024-07-17 DIAGNOSIS — K219 Gastro-esophageal reflux disease without esophagitis: Secondary | ICD-10-CM | POA: Diagnosis not present

## 2024-07-17 DIAGNOSIS — J309 Allergic rhinitis, unspecified: Secondary | ICD-10-CM | POA: Diagnosis not present

## 2024-07-17 NOTE — Progress Notes (Signed)
 "  @Patient  ID: [name], female    DOB: Jul 12, 1940, 84 y.o.   MRN: [id number]  No chief complaint on file.   Referring provider: Jacques Camie Franchot DEVONNA  HPI: 84 year old female, smoked.  Past medical history significant itching, asthma, OSA, allergic rhinitis, hypothyroidism, osteopenia, breast cancer, obesity. Former patient of Dr. Shellia.  Previous [facility] pulmonary encounter:  04/12/2023 Patient presents today for acute OV due to bronchitis symptoms. Seen by PCP and placed on Doxycyline and prednisone , she feels no better since starting antibiotic. Her asthma is typically well-controlled on Advair  100-50 mcg twice daily. She tells me her symptoms started at the same time as her husband. She developed productive cough around September 26th. Cough is worse in the morning, she is getting up thick yellow mucus. Associated PND and nocturnal wheezing.  Has not taken her Lasix  for a couple of days, reports no increased leg swelling.  Denies fever/chills/sweats.    10/10/2023 Discussed the use of AI scribe software for clinical note transcription with the patient, who gave verbal consent to proceed.   History of Present Illness   The patient, with asthma and sleep apnea, presents for follow-up of asthma and sleep apnea management.  She is managing her asthma with Advair  100 - one puff in the morning and one puff at bedtime. There was initial confusion about the dosing, as she thought it was once a day, but it is confirmed to be twice a day. Her asthma symptoms are present but manageable, with pollen exacerbating her condition. She experienced hoarseness recently, which she attributes to allergies. She has used her rescue inhaler once in the past month.  She has a history of sleep apnea and uses a CPAP machine consistently, averaging ten hours of use per night. Her apnea score is 2.9, indicating well-controlled sleep apnea. She recently received a new CPAP machine in March and is compliant  with its use.  She experiences hoarseness and symptoms likely related to allergic rhinitis, exacerbated by pollen. She currently takes Benadryl  for allergies.  She takes Protonix for a past ulcer in the duodenum and continues to use it as part of her treatment regimen. She recalls a heart murmur being detected during tests related to her ulcer but does not remember if an echocardiogram was performed.      Airview download 08/01/23-08/30/23 30/30 days > 4 hours Average usage 10 hours 20 mins Pressure 11cm h20 Airleaks 2.8L/min (95%)  AHI 2.9    07/17/2024- Interim hx  Discussed the use of AI scribe software for clinical note transcription with the patient, who gave verbal consent to proceed.  History of Present Illness [name] is an 84 year old female with sleep apnea and asthma who presents for follow-up.  She uses her CPAP machine nightly, averaging ten hours and twenty-four minutes of use. The pressure is set at eleven, and her apnea score is 8.3, with approximately eight apneic events per hour. She has been sleeping on her back more frequently since her reverse shoulder replacement surgery in June, which may be contributing to her sleep apnea events.  Her asthma is managed with Advair , one puff twice a day. She has no issues with breathing and does not frequently use her rescue inhaler, although she keeps one in her purse and bedroom. Her asthma control test score is 23 out of 25.  She has a history of acid reflux, for which she takes Protonix, and reports no current issues. For rhinitis, she uses Flonase  nasal  spray and Xyzal for allergies.  She underwent a reverse shoulder replacement on June 7 and had a breast implant removed from her left breast on November 12. She experiences knee pain in her left knee, which limits her mobility and contributes to her use of a wheelchair. She is unable to undergo knee replacement surgery due to a high BMI.  She experienced a stress fracture  after starting physical therapy for her shoulder, which required her to stop the therapy. She used a nasal spray, calcitonin salmon, for two to four weeks to aid in healing the fracture.   Airview download 06/16/24-07/15/24 Usage days 30/30 (100%) > 4 hours Average usage 10 hours 24 min Pressure 11cm h20 Airleaks 0.8L/min (95%) AHI 8.3  Allergies[1]  Immunization History  Administered Date(s) Administered   Fluad Quad(high Dose 65+) 04/29/2016, 04/18/2017, 03/16/2019   H1N1 04/05/2011   INFLUENZA, HIGH DOSE SEASONAL PF 04/29/2016, 03/07/2018, 03/20/2019, 03/24/2020, 05/04/2020   Influenza Split 03/09/2013   Influenza Whole 04/15/2012   Influenza, Quadrivalent, Recombinant, Inj, Pf 04/20/2021   Influenza,inj,Quad PF,6+ Mos 04/04/2014   Influenza-Unspecified 05/04/2017   Moderna Covid-19 Fall Seasonal Vaccine 78yrs & older 10/30/2022   Moderna Covid-19 Vaccine Bivalent Booster 29yrs & up 03/13/2021   Moderna Sars-Cov-2 Peds vaccine 50yrs thru 59yrs 03/13/2021   PFIZER Comirnaty(Gray Top)Covid-19 Tri-Sucrose Vaccine 11/28/2020   PFIZER(Purple Top)SARS-COV-2 Vaccination 07/17/2019, 08/06/2019, 11/14/2019, 04/12/2020   Pfizer(Comirnaty)Fall Seasonal Vaccine 12 years and older 04/09/2022   Pneumococcal Conjugate-13 04/04/2014, 05/06/2014   Pneumococcal Polysaccharide-23 03/05/2009   Td 11/06/2006   Tdap 11/06/2006, 04/21/2018   Zoster Recombinant(Shingrix) 03/23/2019, 03/24/2019, 06/23/2019   Zoster, Live 01/22/2013, 02/02/2013    Past Medical History:  Diagnosis Date   Anemia    PMH;  Patient denies this dx as of 06/03/21   Anxiety    Asthma    Breast cancer (HCC) 1988   left breast cancer   CHF (congestive heart failure) (HCC)    Hx of Acute  HF   Collagenous colitis    Compression fracture    lumbar 1   Dyspnea    with exertion   Heart murmur    Hypertension    Hypothyroidism    Osteoarthritis    Osteopenia    Pneumonia 07/2020   PONV (postoperative nausea and  vomiting)    Sleep apnea    wears CPAP nightly    Tobacco History: Tobacco Use History[2] Counseling given: Not Answered   Outpatient Medications Prior to Visit  Medication Sig Dispense Refill   acetaminophen  (TYLENOL ) 650 MG CR tablet Take 650-1,300 mg by mouth 3 (three) times daily.     albuterol  (VENTOLIN  HFA) 108 (90 Base) MCG/ACT inhaler Inhale 2 puffs into the lungs every 6 (six) hours as needed for wheezing or shortness of breath. 54 g 3   B Complex-C (B-COMPLEX WITH VITAMIN C) tablet Take 1 tablet by mouth in the morning.     calcitonin, salmon, (MIACALCIN/FORTICAL) 200 UNIT/ACT nasal spray Place 1 spray into alternate nostrils daily.     Calcium  Carb-Cholecalciferol  (CALTRATE 600+D3 PO) Take 1 tablet by mouth 2 (two) times daily.     Cholecalciferol  (VITAMIN D3 PO) Take 1 tablet by mouth in the morning.     clobetasol ointment (TEMOVATE) 0.05 % Apply 1 application  topically daily as needed (irritation (lichen sclerosus)).     denosumab  (PROLIA ) 60 MG/ML SOSY injection Inject 60 mg into the skin every 6 (six) months.     diclofenac sodium (VOLTAREN) 1 % GEL Apply 1 application  topically 4 (four) times daily as needed (pain.).     diltiazem  (CARDIZEM  CD) 240 MG 24 hr capsule Take 240 mg by mouth in the morning.     diphenhydrAMINE  (BENADRYL ) 50 MG capsule Take 100 mg by mouth 2 (two) times daily.     diphenhydrAMINE  HCl, Sleep, (ZZZQUIL PO) Take 30 mLs by mouth at bedtime.     famotidine  (PEPCID ) 40 MG tablet Take 40 mg by mouth at bedtime.     fluticasone -salmeterol (ADVAIR ) 100-50 MCG/ACT AEPB Inhale 1 puff into the lungs in the morning and at bedtime. 60 each 3   furosemide  (LASIX ) 40 MG tablet Take 40 mg by mouth in the morning.     hydrALAZINE  (APRESOLINE ) 50 MG tablet Take 1 tablet (50 mg total) by mouth 3 (three) times daily. 270 tablet 3   levocetirizine (XYZAL) 5 MG tablet Take 5 mg by mouth at bedtime.     levothyroxine  (SYNTHROID , LEVOTHROID) 88 MCG tablet Take 88 mcg  by mouth daily before breakfast.     losartan  (COZAAR ) 100 MG tablet Take 100 mg by mouth in the morning.     Menthol , Topical Analgesic, (ASPERCREME MAX ROLL-ON) 16 % LIQD Apply 1 Application topically as needed (pain.).     Multiple Vitamins-Minerals (MULTIVITAMIN WITH MINERALS) tablet Take 1 tablet by mouth daily with lunch. Centrum Silver for Women     NON FORMULARY Pt uses a cpap nightly     ondansetron  (ZOFRAN ) 4 MG tablet Take 4 mg by mouth every 8 (eight) hours as needed for nausea or vomiting.     ORTHOVISC 30 MG/2ML SOSY Inject 6 mLs into the skin every 6 (six) months.     pantoprazole (PROTONIX) 40 MG tablet Take 40 mg by mouth daily.     Polyethyl Glycol-Propyl Glycol (SYSTANE) 0.4-0.3 % SOLN Place 1-2 drops into both eyes in the morning and at bedtime.     potassium chloride  SA (KLOR-CON  M20) 20 MEQ tablet TAKE 1 TABLET BY MOUTH 3 TIMES A DAY 270 tablet 3   sertraline  (ZOLOFT ) 100 MG tablet Take 100 mg by mouth 2 (two) times daily.      No facility-administered medications prior to visit.   Review of Systems  Review of Systems  Constitutional: Negative.   Respiratory: Negative.  Negative for cough and wheezing.   Musculoskeletal:  Positive for arthralgias.  Psychiatric/Behavioral:  Negative for sleep disturbance.    Physical Exam  There were no vitals taken for this visit. Physical Exam Constitutional:      Appearance: Normal appearance. She is well-developed.  HENT:     Head: Normocephalic and atraumatic.     Mouth/Throat:     Mouth: Mucous membranes are moist.     Pharynx: Oropharynx is clear.  Eyes:     Pupils: Pupils are equal, round, and reactive to light.  Cardiovascular:     Rate and Rhythm: Normal rate and regular rhythm.     Heart sounds: Normal heart sounds. No murmur heard. Pulmonary:     Effort: Pulmonary effort is normal. No respiratory distress.     Breath sounds: Normal breath sounds. No wheezing or rhonchi.  Musculoskeletal:        General:  Normal range of motion.     Cervical back: Normal range of motion and neck supple.  Skin:    General: Skin is warm and dry.     Findings: No erythema or rash.  Neurological:     General: No focal deficit present.  Mental Status: She is alert and oriented to person, place, and time. Mental status is at baseline.  Psychiatric:        Mood and Affect: Mood normal.        Behavior: Behavior normal.        Thought Content: Thought content normal.        Judgment: Judgment normal.     Lab Results:  CBC    Component Value Date/Time   WBC 6.6 05/11/2024 1054   RBC 3.45 (L) 05/11/2024 1054   HGB 11.8 (L) 05/11/2024 1054   HGB 12.2 09/02/2020 1528   HGB 14.0 12/17/2014 1435   HCT 36.8 05/11/2024 1054   HCT 36.0 09/02/2020 1528   HCT 41.6 12/17/2014 1435   PLT 168 05/11/2024 1054   PLT 185 09/02/2020 1528   MCV 106.7 (H) 05/11/2024 1054   MCV 99 (H) 09/02/2020 1528   MCV 100.8 12/17/2014 1435   MCH 34.2 (H) 05/11/2024 1054   MCHC 32.1 05/11/2024 1054   RDW 13.2 05/11/2024 1054   RDW 12.4 09/02/2020 1528   RDW 13.5 12/17/2014 1435   LYMPHSABS 1.1 06/04/2021 1035   LYMPHSABS 0.9 12/17/2014 1435   MONOABS 0.6 06/04/2021 1035   MONOABS 0.5 12/17/2014 1435   EOSABS 0.3 06/04/2021 1035   EOSABS 0.3 12/17/2014 1435   BASOSABS 0.0 06/04/2021 1035   BASOSABS 0.0 12/17/2014 1435    BMET    Component Value Date/Time   NA 137 05/11/2024 1054   NA 143 12/30/2020 1533   NA 143 12/17/2014 1435   K 4.3 05/11/2024 1054   K 4.0 12/17/2014 1435   CL 101 05/11/2024 1054   CO2 23 05/11/2024 1054   CO2 25 12/17/2014 1435   GLUCOSE 93 05/11/2024 1054   GLUCOSE 103 12/17/2014 1435   BUN 17 05/11/2024 1054   BUN 18 12/30/2020 1533   BUN 16.9 12/17/2014 1435   CREATININE 0.81 05/11/2024 1054   CREATININE 0.9 12/17/2014 1435   CALCIUM  9.2 05/11/2024 1054   CALCIUM  9.8 12/17/2014 1435   GFRNONAA >60 05/11/2024 1054   GFRAA 62 11/30/2019 1344    BNP    Component Value Date/Time    BNP 53.1 09/02/2020 1528    ProBNP No results found for: PROBNP  Imaging: No results found.   Assessment & Plan:   1. OSA (obstructive sleep apnea) (Primary) - Ambulatory Referral for DME  2. Mild persistent asthma without complication  Assessment and Plan Assessment & Plan Obstructive sleep apnea Mild obstructive sleep apnea with an apnea-hypopnea index (AHI) of 8.3 events per hour. Current CPAP pressure setting is 11 cm H2O. Positional changes due to recent surgeries may contribute to increased apneic events. No issues with CPAP usage reported. - Adjusted CPAP pressure settings to a range of 10-14 cm H2O to accommodate positional changes and reduce apneic events. - Instructed to contact the office if there are issues with the new pressure settings.  Asthma Well-controlled with current medication regimen. No recent exacerbations or need for rescue inhaler. Asthma control test score is 23/25, indicating good control. - Continue Advair  100-27mcg one puff twice daily.  Gastroesophageal reflux disease Well-controlled with current medication regimen. - Continue Protonix as prescribed.  Allergic rhinitis Well-managed with current medication regimen. - Continue Flonase  nasal spray and Xyzal as prescribed.  Recording duration: 15 minutes   Almarie LELON Ferrari, NP 07/17/2024     [1]  Allergies Allergen Reactions   Nebivolol  Hcl Other (See Comments)    Hypotensive episode  Bystolic *   Bactrim [Sulfamethoxazole-Trimethoprim] Other (See Comments)    UNSPECIFIED    Dairy Aid [Tilactase] Diarrhea and Other (See Comments)    Ice cream, sour cream   Lactose Intolerance (Gi) Diarrhea   Microplegia Msa-Msg [Cardioplegia [place] Nido Formula] Other (See Comments)    Unknown reaction    Adhesive [Tape] Rash   Mobic [Meloxicam] Nausea Only   Other Nausea And Vomiting    general anesthesia   Sulfa Antibiotics Rash       [2]  Social History Tobacco Use  Smoking Status  Never  Smokeless Tobacco Never   "

## 2024-07-17 NOTE — Patient Instructions (Signed)
" °  VISIT SUMMARY: During your follow-up visit, we discussed your sleep apnea, asthma, acid reflux, and allergic rhinitis. We also reviewed your recent surgeries and knee pain.  YOUR PLAN: -OBSTRUCTIVE SLEEP APNEA: Obstructive sleep apnea is a condition where your breathing stops and starts repeatedly during sleep. We adjusted your CPAP pressure settings to a range of 10-14 cm H2O to accommodate positional changes and reduce apneic events. Please contact the office if you experience any issues with the new pressure settings.  -ASTHMA: Asthma is a condition in which your airways narrow and swell, producing extra mucus. Your asthma is well-controlled with your current medication regimen. Continue using Advair , one puff twice daily.  -GASTROESOPHAGEAL REFLUX DISEASE: Gastroesophageal reflux disease (GERD) is a digestive disorder where stomach acid irritates the food pipe lining. Your condition is well-controlled with your current medication. Continue taking Protonix as prescribed.  -ALLERGIC RHINITIS: Allergic rhinitis is an allergic reaction that causes sneezing, congestion, and a runny nose. Your condition is well-managed with your current medication. Continue using Flonase  nasal spray and Xyzal as prescribed.  INSTRUCTIONS: Please follow up with the office if you experience any issues with the new CPAP pressure settings.  "

## 2024-07-23 ENCOUNTER — Other Ambulatory Visit: Payer: Self-pay | Admitting: Cardiology

## 2024-07-23 DIAGNOSIS — I10 Essential (primary) hypertension: Secondary | ICD-10-CM

## 2024-07-25 ENCOUNTER — Other Ambulatory Visit: Payer: Self-pay | Admitting: Cardiology

## 2024-07-25 DIAGNOSIS — I10 Essential (primary) hypertension: Secondary | ICD-10-CM

## 2024-07-30 MED ORDER — HYDRALAZINE HCL 50 MG PO TABS: 50.0000 mg | ORAL_TABLET | Freq: Three times a day (TID) | ORAL | 0 refills | Status: AC

## 2024-07-30 NOTE — Telephone Encounter (Signed)
 Labs on 05/11/24 were outside of Normal

## 2024-07-31 ENCOUNTER — Other Ambulatory Visit: Payer: Self-pay | Admitting: Cardiology

## 2024-07-31 DIAGNOSIS — I10 Essential (primary) hypertension: Secondary | ICD-10-CM

## 2024-08-06 NOTE — Telephone Encounter (Signed)
 Abs outside of Normal  In accordance with refill protocols, please review and address the following requirements before this medication refill can be authorized:  Labs

## 2024-08-06 NOTE — Telephone Encounter (Signed)
 Please forward to PCP
